# Patient Record
Sex: Male | Born: 1950 | Race: White | Hispanic: No | Smoking: Current every day smoker
Health system: Southern US, Community
[De-identification: ages and names within clinical notes are randomized; demographics above are authoritative.]

## PROBLEM LIST (undated history)

## (undated) DIAGNOSIS — K759 Inflammatory liver disease, unspecified: Secondary | ICD-10-CM

## (undated) DIAGNOSIS — M25569 Pain in unspecified knee: Secondary | ICD-10-CM

## (undated) DIAGNOSIS — M5416 Radiculopathy, lumbar region: Secondary | ICD-10-CM

## (undated) DIAGNOSIS — R011 Cardiac murmur, unspecified: Secondary | ICD-10-CM

## (undated) DIAGNOSIS — F32A Depression, unspecified: Secondary | ICD-10-CM

## (undated) DIAGNOSIS — F419 Anxiety disorder, unspecified: Secondary | ICD-10-CM

## (undated) DIAGNOSIS — M5430 Sciatica, unspecified side: Secondary | ICD-10-CM

## (undated) DIAGNOSIS — G8929 Other chronic pain: Secondary | ICD-10-CM

## (undated) DIAGNOSIS — J449 Chronic obstructive pulmonary disease, unspecified: Secondary | ICD-10-CM

## (undated) DIAGNOSIS — I10 Essential (primary) hypertension: Secondary | ICD-10-CM

## (undated) DIAGNOSIS — Z9981 Dependence on supplemental oxygen: Secondary | ICD-10-CM

## (undated) DIAGNOSIS — S069X9A Unspecified intracranial injury with loss of consciousness of unspecified duration, initial encounter: Secondary | ICD-10-CM

## (undated) DIAGNOSIS — M549 Dorsalgia, unspecified: Secondary | ICD-10-CM

## (undated) DIAGNOSIS — F4310 Post-traumatic stress disorder, unspecified: Secondary | ICD-10-CM

## (undated) DIAGNOSIS — R55 Syncope and collapse: Secondary | ICD-10-CM

## (undated) DIAGNOSIS — F329 Major depressive disorder, single episode, unspecified: Secondary | ICD-10-CM

## (undated) DIAGNOSIS — E119 Type 2 diabetes mellitus without complications: Secondary | ICD-10-CM

## (undated) DIAGNOSIS — K219 Gastro-esophageal reflux disease without esophagitis: Secondary | ICD-10-CM

## (undated) DIAGNOSIS — I739 Peripheral vascular disease, unspecified: Secondary | ICD-10-CM

## (undated) DIAGNOSIS — J189 Pneumonia, unspecified organism: Secondary | ICD-10-CM

## (undated) DIAGNOSIS — M1990 Unspecified osteoarthritis, unspecified site: Secondary | ICD-10-CM

## (undated) DIAGNOSIS — IMO0002 Reserved for concepts with insufficient information to code with codable children: Secondary | ICD-10-CM

## (undated) DIAGNOSIS — T4145XA Adverse effect of unspecified anesthetic, initial encounter: Secondary | ICD-10-CM

## (undated) DIAGNOSIS — J9620 Acute and chronic respiratory failure, unspecified whether with hypoxia or hypercapnia: Secondary | ICD-10-CM

## (undated) DIAGNOSIS — K029 Dental caries, unspecified: Secondary | ICD-10-CM

## (undated) DIAGNOSIS — G4730 Sleep apnea, unspecified: Secondary | ICD-10-CM

## (undated) DIAGNOSIS — M75100 Unspecified rotator cuff tear or rupture of unspecified shoulder, not specified as traumatic: Secondary | ICD-10-CM

## (undated) HISTORY — PX: ROTATOR CUFF REPAIR: SHX139

## (undated) HISTORY — PX: JOINT REPLACEMENT: SHX530

## (undated) HISTORY — PX: ORTHOPEDIC SURGERY: SHX850

## (undated) HISTORY — PX: FACIAL RECONSTRUCTION SURGERY: SHX631

## (undated) HISTORY — PX: INCISION AND DRAINAGE ABSCESS: SHX5864

---

## 2007-10-07 ENCOUNTER — Emergency Department (HOSPITAL_COMMUNITY): Admission: EM | Admit: 2007-10-07 | Discharge: 2007-10-07 | Payer: Self-pay | Admitting: Emergency Medicine

## 2009-02-13 ENCOUNTER — Emergency Department (HOSPITAL_COMMUNITY): Admission: EM | Admit: 2009-02-13 | Discharge: 2009-02-13 | Payer: Self-pay | Admitting: Emergency Medicine

## 2009-03-09 ENCOUNTER — Emergency Department (HOSPITAL_COMMUNITY): Admission: EM | Admit: 2009-03-09 | Discharge: 2009-03-09 | Payer: Self-pay | Admitting: Emergency Medicine

## 2010-02-19 ENCOUNTER — Encounter: Payer: Self-pay | Admitting: Gastroenterology

## 2010-10-04 ENCOUNTER — Encounter: Payer: Self-pay | Admitting: Emergency Medicine

## 2010-10-04 ENCOUNTER — Emergency Department (HOSPITAL_COMMUNITY)
Admission: EM | Admit: 2010-10-04 | Discharge: 2010-10-05 | Disposition: A | Discharge status: Home / Self Care | Payer: Medicaid Other | Attending: Emergency Medicine | Admitting: Emergency Medicine

## 2010-10-04 DIAGNOSIS — S0100XA Unspecified open wound of scalp, initial encounter: Secondary | ICD-10-CM | POA: Insufficient documentation

## 2010-10-04 DIAGNOSIS — I10 Essential (primary) hypertension: Secondary | ICD-10-CM | POA: Insufficient documentation

## 2010-10-04 DIAGNOSIS — S0003XA Contusion of scalp, initial encounter: Secondary | ICD-10-CM

## 2010-10-04 DIAGNOSIS — IMO0002 Reserved for concepts with insufficient information to code with codable children: Secondary | ICD-10-CM | POA: Insufficient documentation

## 2010-10-04 DIAGNOSIS — T07XXXA Unspecified multiple injuries, initial encounter: Secondary | ICD-10-CM

## 2010-10-04 DIAGNOSIS — F17200 Nicotine dependence, unspecified, uncomplicated: Secondary | ICD-10-CM | POA: Insufficient documentation

## 2010-10-04 DIAGNOSIS — T7411XA Adult physical abuse, confirmed, initial encounter: Secondary | ICD-10-CM | POA: Insufficient documentation

## 2010-10-04 HISTORY — DX: Sciatica, unspecified side: M54.30

## 2010-10-04 HISTORY — DX: Reserved for concepts with insufficient information to code with codable children: IMO0002

## 2010-10-04 HISTORY — DX: Depression, unspecified: F32.A

## 2010-10-04 HISTORY — DX: Anxiety disorder, unspecified: F41.9

## 2010-10-04 HISTORY — DX: Major depressive disorder, single episode, unspecified: F32.9

## 2010-10-04 HISTORY — DX: Post-traumatic stress disorder, unspecified: F43.10

## 2010-10-04 HISTORY — DX: Unspecified rotator cuff tear or rupture of unspecified shoulder, not specified as traumatic: M75.100

## 2010-10-04 HISTORY — DX: Essential (primary) hypertension: I10
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
## 2010-10-05 NOTE — ED Provider Notes (Signed)
Medical screening examination/treatment/procedure(s) were performed by non-physician practitioner and as supervising physician I was immediately available for consultation/collaboration.   Charles B. Bernette Mayers, MD 10/05/10 1610

## 2010-11-05 ENCOUNTER — Emergency Department (HOSPITAL_COMMUNITY): Payer: No Typology Code available for payment source

## 2010-11-05 ENCOUNTER — Emergency Department (HOSPITAL_COMMUNITY)
Admission: EM | Admit: 2010-11-05 | Discharge: 2010-11-05 | Disposition: A | Discharge status: Home / Self Care | Payer: No Typology Code available for payment source | Attending: Emergency Medicine | Admitting: Emergency Medicine

## 2010-11-05 ENCOUNTER — Encounter (HOSPITAL_COMMUNITY): Payer: Self-pay | Admitting: Emergency Medicine

## 2010-11-05 DIAGNOSIS — M542 Cervicalgia: Secondary | ICD-10-CM | POA: Insufficient documentation

## 2010-11-05 DIAGNOSIS — M25519 Pain in unspecified shoulder: Secondary | ICD-10-CM | POA: Insufficient documentation

## 2010-11-05 DIAGNOSIS — F17200 Nicotine dependence, unspecified, uncomplicated: Secondary | ICD-10-CM | POA: Insufficient documentation

## 2010-11-05 DIAGNOSIS — R51 Headache: Secondary | ICD-10-CM | POA: Insufficient documentation

## 2010-11-05 DIAGNOSIS — R42 Dizziness and giddiness: Secondary | ICD-10-CM | POA: Insufficient documentation

## 2010-11-05 NOTE — ED Notes (Signed)
Patient to x ray at this time via wheelchair. NAD noted upon departure.

## 2010-11-05 NOTE — ED Notes (Signed)
Pt waiting on radiology results and discharge from EDP.  Denies any pain or complaints at present time.  States "I'm just hungry and ready to go home."
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
## 2010-11-05 NOTE — ED Notes (Signed)
Dr Estell Harpin at bedside for pt evaluation.

## 2010-11-11 ENCOUNTER — Emergency Department (HOSPITAL_COMMUNITY)
Admission: EM | Admit: 2010-11-11 | Discharge: 2010-11-12 | Disposition: A | Discharge status: Home / Self Care | Payer: No Typology Code available for payment source | Attending: Emergency Medicine | Admitting: Emergency Medicine

## 2010-11-11 ENCOUNTER — Encounter (HOSPITAL_COMMUNITY): Payer: Self-pay | Admitting: *Deleted

## 2010-11-11 DIAGNOSIS — I10 Essential (primary) hypertension: Secondary | ICD-10-CM | POA: Insufficient documentation

## 2010-11-11 DIAGNOSIS — M549 Dorsalgia, unspecified: Secondary | ICD-10-CM | POA: Insufficient documentation

## 2010-11-11 DIAGNOSIS — F17200 Nicotine dependence, unspecified, uncomplicated: Secondary | ICD-10-CM | POA: Insufficient documentation

## 2010-11-11 DIAGNOSIS — F411 Generalized anxiety disorder: Secondary | ICD-10-CM | POA: Insufficient documentation

## 2010-11-11 DIAGNOSIS — F3289 Other specified depressive episodes: Secondary | ICD-10-CM | POA: Insufficient documentation

## 2010-11-11 DIAGNOSIS — M5430 Sciatica, unspecified side: Secondary | ICD-10-CM | POA: Insufficient documentation

## 2010-11-11 DIAGNOSIS — F329 Major depressive disorder, single episode, unspecified: Secondary | ICD-10-CM | POA: Insufficient documentation

## 2010-11-11 DIAGNOSIS — F4310 Post-traumatic stress disorder, unspecified: Secondary | ICD-10-CM | POA: Insufficient documentation

## 2010-11-11 MED ORDER — IBUPROFEN 800 MG PO TABS
800.0000 mg | ORAL_TABLET | Freq: Once | ORAL | Status: AC
  Administered 2010-11-12: 800 mg via ORAL
  Filled 2010-11-11: qty 1

## 2010-11-11 MED ORDER — OXYCODONE-ACETAMINOPHEN 5-325 MG PO TABS
2.0000 | ORAL_TABLET | Freq: Once | ORAL | Status: AC
  Administered 2010-11-12: 2 via ORAL
  Filled 2010-11-11: qty 2
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
## 2011-03-21 ENCOUNTER — Encounter (HOSPITAL_COMMUNITY): Payer: Self-pay | Admitting: *Deleted

## 2011-03-21 ENCOUNTER — Emergency Department (HOSPITAL_COMMUNITY)
Admission: EM | Admit: 2011-03-21 | Discharge: 2011-03-21 | Disposition: A | Discharge status: Home / Self Care | Payer: Medicaid Other | Attending: Emergency Medicine | Admitting: Emergency Medicine

## 2011-03-21 DIAGNOSIS — M545 Low back pain, unspecified: Secondary | ICD-10-CM | POA: Insufficient documentation

## 2011-03-21 DIAGNOSIS — F341 Dysthymic disorder: Secondary | ICD-10-CM | POA: Insufficient documentation

## 2011-03-21 DIAGNOSIS — I10 Essential (primary) hypertension: Secondary | ICD-10-CM | POA: Insufficient documentation

## 2011-03-21 DIAGNOSIS — Z760 Encounter for issue of repeat prescription: Secondary | ICD-10-CM | POA: Insufficient documentation

## 2011-03-21 DIAGNOSIS — G8929 Other chronic pain: Secondary | ICD-10-CM | POA: Insufficient documentation

## 2011-03-21 DIAGNOSIS — F17200 Nicotine dependence, unspecified, uncomplicated: Secondary | ICD-10-CM | POA: Insufficient documentation

## 2011-03-21 MED ORDER — OXYCODONE-ACETAMINOPHEN 10-325 MG PO TABS: 1.0000 | ORAL_TABLET | Freq: Four times a day (QID) | ORAL | Status: DC | PRN

## 2011-03-21 MED ORDER — HYDROMORPHONE HCL PF 2 MG/ML IJ SOLN
2.0000 mg | Freq: Once | INTRAMUSCULAR | Status: AC
  Administered 2011-03-21: 2 mg via INTRAMUSCULAR
  Filled 2011-03-21: qty 1

## 2011-03-21 MED ORDER — ONDANSETRON 4 MG PO TBDP
4.0000 mg | ORAL_TABLET | Freq: Once | ORAL | Status: AC
  Administered 2011-03-21: 4 mg via ORAL
  Filled 2011-03-21: qty 1

## 2011-03-21 NOTE — ED Notes (Signed)
C/o lower back pain that radiates down both legs x 2 days, attempted to see MD at pain clinic today but unable and meds have ran out

## 2011-03-21 NOTE — ED Notes (Signed)
Pharmacy tech in to review medications with patient.  Please review that document- differs from what I heard him say just prior to her arrival.

## 2011-03-21 NOTE — ED Notes (Signed)
Scheduled for visit at pain clinic today.  Arrived and tested and informed MD not there would need to return next week.  Last pain med was two days ago - ran out on 16 and 18 with appt on 20th.  Describes pain as severe.
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
## 2011-03-21 NOTE — Discharge Instructions (Signed)
Follow up with the MD of your choice.
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
## 2011-03-22 NOTE — ED Provider Notes (Signed)
Medical screening examination/treatment/procedure(s) were performed by non-physician practitioner and as supervising physician I was immediately available for consultation/collaboration.   [name], MD 03/22/11 [phone]

## 2011-03-23 ENCOUNTER — Encounter (HOSPITAL_COMMUNITY): Payer: Self-pay | Admitting: *Deleted

## 2011-03-23 ENCOUNTER — Emergency Department (HOSPITAL_COMMUNITY): Payer: No Typology Code available for payment source

## 2011-03-23 ENCOUNTER — Emergency Department (HOSPITAL_COMMUNITY)
Admission: EM | Admit: 2011-03-23 | Discharge: 2011-03-23 | Disposition: A | Discharge status: Home / Self Care | Payer: No Typology Code available for payment source | Attending: Emergency Medicine | Admitting: Emergency Medicine

## 2011-03-23 DIAGNOSIS — R109 Unspecified abdominal pain: Secondary | ICD-10-CM | POA: Insufficient documentation

## 2011-03-23 DIAGNOSIS — M79609 Pain in unspecified limb: Secondary | ICD-10-CM | POA: Insufficient documentation

## 2011-03-23 DIAGNOSIS — M545 Low back pain, unspecified: Secondary | ICD-10-CM | POA: Insufficient documentation

## 2011-03-23 DIAGNOSIS — Z79899 Other long term (current) drug therapy: Secondary | ICD-10-CM | POA: Insufficient documentation

## 2011-03-23 DIAGNOSIS — F17200 Nicotine dependence, unspecified, uncomplicated: Secondary | ICD-10-CM | POA: Insufficient documentation

## 2011-03-23 DIAGNOSIS — Y92410 Unspecified street and highway as the place of occurrence of the external cause: Secondary | ICD-10-CM | POA: Insufficient documentation

## 2011-03-23 DIAGNOSIS — F341 Dysthymic disorder: Secondary | ICD-10-CM | POA: Insufficient documentation

## 2011-03-23 DIAGNOSIS — I10 Essential (primary) hypertension: Secondary | ICD-10-CM | POA: Insufficient documentation

## 2011-03-23 DIAGNOSIS — R5381 Other malaise: Secondary | ICD-10-CM | POA: Insufficient documentation

## 2011-03-23 DIAGNOSIS — R079 Chest pain, unspecified: Secondary | ICD-10-CM | POA: Insufficient documentation

## 2011-03-23 DIAGNOSIS — IMO0001 Reserved for inherently not codable concepts without codable children: Secondary | ICD-10-CM | POA: Insufficient documentation

## 2011-03-23 MED ORDER — OXYCODONE-ACETAMINOPHEN 10-325 MG PO TABS: 1.0000 | ORAL_TABLET | Freq: Four times a day (QID) | ORAL | Status: AC | PRN

## 2011-03-23 MED ORDER — MORPHINE SULFATE CR 30 MG PO TB12: 30.0000 mg | ORAL_TABLET | Freq: Two times a day (BID) | ORAL | Status: AC

## 2011-03-23 NOTE — ED Notes (Signed)
Pt requests that cervical collar be taken off. Pt notified that he will need to leave it on until after his x-rays.

## 2011-03-23 NOTE — ED Notes (Signed)
Family at bedside. Patient was given an ice pack for his foot. Patient wants something to drink.
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
## 2011-03-23 NOTE — ED Notes (Signed)
Pt taken off backboard per protocol. Cervical collar left in place.

## 2011-03-23 NOTE — ED Notes (Signed)
Ice pack to patients right foot. Pt has cervical collar in place. Sitting up on stretcher talking and laughing but c/o pain.

## 2011-04-01 ENCOUNTER — Other Ambulatory Visit: Payer: Self-pay

## 2011-04-01 ENCOUNTER — Emergency Department (HOSPITAL_COMMUNITY)
Admission: EM | Admit: 2011-04-01 | Discharge: 2011-04-01 | Disposition: A | Discharge status: Home / Self Care | Payer: No Typology Code available for payment source | Attending: Emergency Medicine | Admitting: Emergency Medicine

## 2011-04-01 ENCOUNTER — Encounter (HOSPITAL_COMMUNITY): Payer: Self-pay

## 2011-04-01 DIAGNOSIS — S9030XA Contusion of unspecified foot, initial encounter: Secondary | ICD-10-CM | POA: Insufficient documentation

## 2011-04-01 DIAGNOSIS — Z79899 Other long term (current) drug therapy: Secondary | ICD-10-CM | POA: Insufficient documentation

## 2011-04-01 DIAGNOSIS — I10 Essential (primary) hypertension: Secondary | ICD-10-CM | POA: Insufficient documentation

## 2011-04-01 DIAGNOSIS — F341 Dysthymic disorder: Secondary | ICD-10-CM | POA: Insufficient documentation

## 2011-04-01 DIAGNOSIS — S161XXA Strain of muscle, fascia and tendon at neck level, initial encounter: Secondary | ICD-10-CM

## 2011-04-01 DIAGNOSIS — M542 Cervicalgia: Secondary | ICD-10-CM | POA: Insufficient documentation

## 2011-04-01 DIAGNOSIS — M79609 Pain in unspecified limb: Secondary | ICD-10-CM | POA: Insufficient documentation

## 2011-04-01 DIAGNOSIS — M545 Low back pain, unspecified: Secondary | ICD-10-CM | POA: Insufficient documentation

## 2011-04-01 DIAGNOSIS — M546 Pain in thoracic spine: Secondary | ICD-10-CM | POA: Insufficient documentation

## 2011-04-01 DIAGNOSIS — S139XXA Sprain of joints and ligaments of unspecified parts of neck, initial encounter: Secondary | ICD-10-CM | POA: Insufficient documentation

## 2011-04-01 MED ORDER — OXYCODONE-ACETAMINOPHEN 5-325 MG PO TABS: 1.0000 | ORAL_TABLET | Freq: Four times a day (QID) | ORAL | Status: AC | PRN
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
## 2011-04-10 ENCOUNTER — Emergency Department (HOSPITAL_COMMUNITY)
Admission: EM | Admit: 2011-04-10 | Discharge: 2011-04-10 | Disposition: A | Discharge status: Home / Self Care | Payer: Medicaid Other | Attending: Emergency Medicine | Admitting: Emergency Medicine

## 2011-04-10 ENCOUNTER — Encounter (HOSPITAL_COMMUNITY): Payer: Self-pay | Admitting: *Deleted

## 2011-04-10 DIAGNOSIS — I10 Essential (primary) hypertension: Secondary | ICD-10-CM | POA: Insufficient documentation

## 2011-04-10 DIAGNOSIS — F411 Generalized anxiety disorder: Secondary | ICD-10-CM | POA: Insufficient documentation

## 2011-04-10 DIAGNOSIS — F3289 Other specified depressive episodes: Secondary | ICD-10-CM | POA: Insufficient documentation

## 2011-04-10 DIAGNOSIS — M5430 Sciatica, unspecified side: Secondary | ICD-10-CM | POA: Insufficient documentation

## 2011-04-10 DIAGNOSIS — G8929 Other chronic pain: Secondary | ICD-10-CM | POA: Insufficient documentation

## 2011-04-10 DIAGNOSIS — F4310 Post-traumatic stress disorder, unspecified: Secondary | ICD-10-CM | POA: Insufficient documentation

## 2011-04-10 DIAGNOSIS — F17200 Nicotine dependence, unspecified, uncomplicated: Secondary | ICD-10-CM | POA: Insufficient documentation

## 2011-04-10 DIAGNOSIS — F329 Major depressive disorder, single episode, unspecified: Secondary | ICD-10-CM | POA: Insufficient documentation

## 2011-04-10 DIAGNOSIS — Z79899 Other long term (current) drug therapy: Secondary | ICD-10-CM | POA: Insufficient documentation

## 2011-04-10 MED ORDER — OXYCODONE-ACETAMINOPHEN 5-325 MG PO TABS
2.0000 | ORAL_TABLET | Freq: Once | ORAL | Status: AC
  Administered 2011-04-10: 2 via ORAL
  Filled 2011-04-10: qty 2

## 2011-04-10 MED ORDER — NAPROXEN 500 MG PO TABS: 500.0000 mg | ORAL_TABLET | Freq: Two times a day (BID) | ORAL | Status: DC
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
## 2011-04-10 NOTE — ED Notes (Signed)
Hx of chronic pain-reports pain from neck down-c/o pain to neck, knees, lower back; reports hx of sciatica; was being seen by pain clinic in [place], but was dismissed; reports was not given any Rx to hold him over until he could be seen by another clinic; reports rec'd a note from his pain clinic MD to bring to hospital or doctor for Rx meds for pain; was seen here approx 1 week ago-was written an Rx for Percocet-pt is out of pain meds.

## 2011-04-26 ENCOUNTER — Encounter (HOSPITAL_COMMUNITY): Payer: Self-pay | Admitting: *Deleted

## 2011-04-26 ENCOUNTER — Emergency Department (HOSPITAL_COMMUNITY)
Admission: EM | Admit: 2011-04-26 | Discharge: 2011-04-26 | Disposition: A | Discharge status: Home / Self Care | Payer: No Typology Code available for payment source | Attending: Emergency Medicine | Admitting: Emergency Medicine

## 2011-04-26 DIAGNOSIS — F4310 Post-traumatic stress disorder, unspecified: Secondary | ICD-10-CM | POA: Insufficient documentation

## 2011-04-26 DIAGNOSIS — Z79899 Other long term (current) drug therapy: Secondary | ICD-10-CM | POA: Insufficient documentation

## 2011-04-26 DIAGNOSIS — F3289 Other specified depressive episodes: Secondary | ICD-10-CM | POA: Insufficient documentation

## 2011-04-26 DIAGNOSIS — F329 Major depressive disorder, single episode, unspecified: Secondary | ICD-10-CM | POA: Insufficient documentation

## 2011-04-26 DIAGNOSIS — Z888 Allergy status to other drugs, medicaments and biological substances status: Secondary | ICD-10-CM | POA: Insufficient documentation

## 2011-04-26 DIAGNOSIS — I10 Essential (primary) hypertension: Secondary | ICD-10-CM | POA: Insufficient documentation

## 2011-04-26 DIAGNOSIS — G8929 Other chronic pain: Secondary | ICD-10-CM | POA: Insufficient documentation

## 2011-04-26 DIAGNOSIS — M549 Dorsalgia, unspecified: Secondary | ICD-10-CM

## 2011-04-26 DIAGNOSIS — F17200 Nicotine dependence, unspecified, uncomplicated: Secondary | ICD-10-CM | POA: Insufficient documentation

## 2011-04-26 MED ORDER — OXYCODONE-ACETAMINOPHEN 5-325 MG PO TABS: 1.0000 | ORAL_TABLET | ORAL | Status: DC | PRN

## 2011-04-26 MED ORDER — METHOCARBAMOL 500 MG PO TABS: ORAL_TABLET | ORAL | Status: DC

## 2011-04-26 MED ORDER — OXYCODONE-ACETAMINOPHEN 5-325 MG PO TABS
2.0000 | ORAL_TABLET | Freq: Once | ORAL | Status: AC
  Administered 2011-04-26: 2 via ORAL
  Filled 2011-04-26: qty 2
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
## 2011-04-26 NOTE — ED Notes (Signed)
Pt DC to home with steady gait 

## 2011-04-26 NOTE — Discharge Instructions (Signed)
Chronic Back Pain When back pain lasts longer than 3 months, it is called chronic back pain.This pain can be frustrating, but the cause of the pain is rarely dangerous.People with chronic back pain often go through certain periods that are more intense (flare-ups). CAUSES Chronic back pain can be caused by wear and tear (degeneration) on different structures in your back. These structures may include bones, ligaments, or discs. This degeneration may result in more pressure being placed on the nerves that travel to your legs and feet. This can lead to pain traveling from the low back down the back of the legs. When pain lasts longer than 3 months, it is not unusual for people to experience anxiety or depression. Anxiety and depression can also contribute to low back pain. TREATMENT  Establish a regular exercise plan. This is critical to improving your functional level.   Have a self-management plan for when you flare-up. Flare-ups rarely require a medical visit. Regular exercise will help reduce the intensity and frequency of your flare-ups.   Manage how you feel about your back pain and the rest of your life. Anxiety, depression, and feeling that you cannot alter your back pain have been shown to make back pain more intense and debilitating.   Medicines should never be your only treatment. They should be used along with other treatments to help you return to a more active lifestyle.   Procedures such as injections or surgery may be helpful but are rarely necessary. You may be able to get the same results with physical therapy or chiropractic care.  HOME CARE INSTRUCTIONS  Avoid bending, heavy lifting, prolonged sitting, and activities which make the problem worse.   Continue normal activity as much as possible.   Take brief periods of rest throughout the day to reduce your pain during flare-ups.   Follow your back exercise rehabilitation program. This can help reduce symptoms and prevent  more pain.   Only take over-the-counter or prescription medicines as directed by your caregiver. Muscle relaxants are sometimes prescribed. Narcotic pain medicine is discouraged for long-term pain, since addiction is a possible outcome.   If you smoke, quit.   Eat healthy foods and maintain a recommended body weight.  SEEK IMMEDIATE MEDICAL CARE IF:   You have weakness or numbness in one of your legs or feet.   You have trouble controlling your bladder or bowels.   You develop nausea, vomiting, abdominal pain, shortness of breath, or fainting.  Document Released: 02/23/2004 Document Revised: 01/04/2011 Document Reviewed: 12/30/2010 [employer] Patient Information 2012 [employer]. 

## 2011-04-27 NOTE — ED Provider Notes (Signed)
Medical screening examination/treatment/procedure(s) were performed by non-physician practitioner and as supervising physician I was immediately available for consultation/collaboration.  Terriann Difonzo, MD 04/27/11 0040 

## 2011-05-04 ENCOUNTER — Encounter (HOSPITAL_COMMUNITY): Payer: Self-pay

## 2011-05-04 ENCOUNTER — Emergency Department (HOSPITAL_COMMUNITY)
Admission: EM | Admit: 2011-05-04 | Discharge: 2011-05-04 | Disposition: A | Discharge status: Home / Self Care | Payer: Medicaid Other | Attending: Emergency Medicine | Admitting: Emergency Medicine

## 2011-05-04 ENCOUNTER — Emergency Department (HOSPITAL_COMMUNITY): Payer: Medicaid Other

## 2011-05-04 DIAGNOSIS — IMO0002 Reserved for concepts with insufficient information to code with codable children: Secondary | ICD-10-CM | POA: Insufficient documentation

## 2011-05-04 DIAGNOSIS — Z79899 Other long term (current) drug therapy: Secondary | ICD-10-CM | POA: Insufficient documentation

## 2011-05-04 DIAGNOSIS — S43409A Unspecified sprain of unspecified shoulder joint, initial encounter: Secondary | ICD-10-CM

## 2011-05-04 DIAGNOSIS — M25519 Pain in unspecified shoulder: Secondary | ICD-10-CM | POA: Insufficient documentation

## 2011-05-04 DIAGNOSIS — R609 Edema, unspecified: Secondary | ICD-10-CM | POA: Insufficient documentation

## 2011-05-04 DIAGNOSIS — Z9889 Other specified postprocedural states: Secondary | ICD-10-CM | POA: Insufficient documentation

## 2011-05-04 DIAGNOSIS — I10 Essential (primary) hypertension: Secondary | ICD-10-CM | POA: Insufficient documentation

## 2011-05-04 DIAGNOSIS — X500XXA Overexertion from strenuous movement or load, initial encounter: Secondary | ICD-10-CM | POA: Insufficient documentation

## 2011-05-04 MED ORDER — NAPROXEN 500 MG PO TABS: 500.0000 mg | ORAL_TABLET | Freq: Two times a day (BID) | ORAL | Status: DC

## 2011-05-04 MED ORDER — ONDANSETRON 8 MG PO TBDP
8.0000 mg | ORAL_TABLET | Freq: Once | ORAL | Status: AC
  Administered 2011-05-04: 8 mg via ORAL
  Filled 2011-05-04: qty 1

## 2011-05-04 MED ORDER — OXYCODONE-ACETAMINOPHEN 5-325 MG PO TABS: 1.0000 | ORAL_TABLET | ORAL | Status: AC | PRN

## 2011-05-04 MED ORDER — HYDROMORPHONE HCL PF 1 MG/ML IJ SOLN
1.0000 mg | Freq: Once | INTRAMUSCULAR | Status: AC
  Administered 2011-05-04: 1 mg via INTRAMUSCULAR
  Filled 2011-05-04: qty 1

## 2011-05-04 MED ORDER — METHOCARBAMOL 500 MG PO TABS: 1000.0000 mg | ORAL_TABLET | Freq: Three times a day (TID) | ORAL | Status: AC

## 2011-05-04 NOTE — ED Notes (Signed)
Pt presents with left shoulder pain after hearing a "pop" while moving arm quickly to pick up a dish tonight. Pt has had surgery on left shoulder in the pain. Pt states arm is numb and having shooting pain down arm.

## 2011-05-04 NOTE — ED Notes (Signed)
Pt states that he has an appt with PCP on 10th and with pain clinic on the 14th.

## 2011-05-04 NOTE — Discharge Instructions (Signed)
Ligament Sprain A ligament sprain is when the bands of tissue that hold bones together (ligament) are stretched. HOME CARE   Rest the injured area.   Start using the joint when told to by your doctor.   Keep the injured area raised (elevated) above the level of the heart. This may lessen puffiness (swelling).   Put ice on the injured area.   Put ice in a plastic bag.   Place a towel between your skin and the bag.   Leave the ice on for 15 to 20 minutes, 3 to 4 times a day.   Wear a splint, cast, or an elastic bandage as told by your doctor.   Only take medicine as told by your doctor.   Use crutches as told by your doctor. Do not put weight on the injured joint until told to by your doctor.  GET HELP RIGHT AWAY IF:   You have more bruising, puffiness, or pain.   The leg was injured and the toes are cold, tingling, numb, or blue.   The arm was injured and the fingers are cold, tingling, numb, or blue.   The pain is not helped with medicine.   The pain gets worse.  MAKE SURE YOU:   Understand these instructions.   Will watch this condition.   Will get help right away if you are not doing well or get worse.  Document Released: 07/04/2007 Document Revised: 01/04/2011 Document Reviewed: 07/04/2007 [facility] Patient Information 2012 [place], [place].   Use the medicines prescribed in addition to the percocet you already have for your pain relief.  Ice and elevate your arm,  Use the sling for comfort.  Call Dr Chaney Malling for a  Recheck of your injury on Monday.
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
## 2011-05-05 NOTE — ED Provider Notes (Signed)
Medical screening examination/treatment/procedure(s) were performed by non-physician practitioner and as supervising physician I was immediately available for consultation/collaboration.   Darcey Cardy, MD 05/05/11 2338 

## 2011-05-05 NOTE — ED Provider Notes (Signed)
History     CSN: [id number]  Arrival date & time 05/04/11  2107   First MD Initiated Contact with Patient 05/04/11 2142      Chief Complaint  Patient presents with  . Shoulder Pain    (Consider location/radiation/quality/duration/timing/severity/associated sxs/prior treatment) HPI Comments: Patient has a history of chronic left shoulder pain with surgery of the torn rotator cuff who developed sudden onset of pain in the anterior left shoulder radiating into his mid biceps area after trying to catch the plate which fell off of the counter prior to arrival.  Pain is worse with flexing his elbow and range of motion of his left shoulder.  He also has swelling at his left bicep.  Patient is a 61 y.o. male presenting with shoulder pain. The history is provided by the patient.  Shoulder Pain This is a recurrent problem. The current episode started today. The problem occurs constantly. The problem has been rapidly worsening. Associated symptoms include arthralgias, myalgias and weakness. Pertinent negatives include no chest pain, chills, fever, joint swelling, nausea, neck pain, numbness or rash. He has tried nothing for the symptoms.    Past Medical History  Diagnosis Date  . Sciatica   . Bulging discs   . Torn rotator cuff   . PTSD (post-traumatic stress disorder)   . Depression   . Anxiety   . Hypertension   . Apnea     Past Surgical History  Procedure Date  . Orthopedic surgery   . Rotator cuff repair     left    Family History  Problem Relation Age of Onset  . Hypertension Sister   . Diabetes Sister     History  Substance Use Topics  . Smoking status: Current Some Day Smoker -- 0.2 packs/day for 1 years    Types: Cigarettes  . Smokeless tobacco: Never Used  . Alcohol Use: No      Review of Systems  Constitutional: Negative for fever and chills.  HENT: Negative for neck pain.   Respiratory: Negative for chest tightness and shortness of breath.   Cardiovascular:  Negative for chest pain.  Gastrointestinal: Negative for nausea.  Genitourinary: Negative.   Musculoskeletal: Positive for myalgias and arthralgias. Negative for joint swelling.  Skin: Negative.  Negative for rash and wound.  Neurological: Positive for weakness. Negative for numbness.  Hematological: Negative.   Psychiatric/Behavioral: Negative.     Allergies  Flexeril and Other  Home Medications   Current Outpatient Rx  Name Route Sig Dispense Refill  . B-COMPLEX PO Oral Take 1 capsule by mouth daily.    Marland Kitchen CLONAZEPAM 1 MG PO TABS Oral Take 1 mg by mouth 4 (four) times daily.     Marland Kitchen ESCITALOPRAM OXALATE 20 MG PO TABS Oral Take 20 mg by mouth daily.      Marland Kitchen GABAPENTIN 600 MG PO TABS Oral Take 600 mg by mouth 3 (three) times daily.    Marland Kitchen HYDROCHLOROTHIAZIDE 25 MG PO TABS Oral Take 25 mg by mouth daily.      Marland Kitchen METHOCARBAMOL 500 MG PO TABS  Take two tabs po TID prn muscle spasms 42 tablet 0  . NAPROXEN 500 MG PO TABS Oral Take 1 tablet (500 mg total) by mouth 2 (two) times daily with a meal. 30 tablet 2  . OXYCODONE-ACETAMINOPHEN 10-325 MG PO TABS Oral Take 1 tablet by mouth every 4 (four) hours as needed.    . METHOCARBAMOL 500 MG PO TABS Oral Take 2 tablets (1,000 mg total) by  mouth 3 (three) times daily. 20 tablet 0  . NAPROXEN 500 MG PO TABS Oral Take 1 tablet (500 mg total) by mouth 2 (two) times daily. 30 tablet 0  . OXYCODONE-ACETAMINOPHEN 5-325 MG PO TABS Oral Take 1 tablet by mouth every 4 (four) hours as needed for pain. 20 tablet 0    BP 158/99  Pulse 100  Temp(Src) 97.6 F (36.4 C) (Oral)  Resp 18  Ht 5\' 11"  (1.803 m)  Wt 280 lb (127.007 kg)  BMI 39.05 kg/m2  SpO2 97%  Physical Exam  Nursing note and vitals reviewed. Constitutional: He is oriented to person, place, and time. He appears well-developed and well-nourished.  HENT:  Head: Normocephalic.  Eyes: Conjunctivae are normal.  Neck: Normal range of motion.  Cardiovascular: Normal rate and intact distal pulses.   Exam reveals no decreased pulses.   Pulses:      Dorsalis pedis pulses are 2+ on the right side, and 2+ on the left side.       Posterior tibial pulses are 2+ on the right side, and 2+ on the left side.  Pulmonary/Chest: Effort normal.  Musculoskeletal: He exhibits edema and tenderness.       Left shoulder: He exhibits tenderness, bony tenderness, pain and spasm. He exhibits normal range of motion, normal pulse and normal strength.       Left upper arm: He exhibits tenderness, swelling and deformity.       Patient does exhibit decreased strength with attempts to flex elbow against resistance.  He is very tender mid bicep and he does appear to have bulging and deformity of the left bicep in comparison to the right.  He can flex and extend at the shoulder fully but with discomfort.  He has full extension of the left elbow.  Neurological: He is alert and oriented to person, place, and time. No sensory deficit.  Skin: Skin is warm, dry and intact.    ED Course  Procedures (including critical care time)  Labs Reviewed - No data to display Dg Shoulder Left  05/04/2011  *RADIOLOGY REPORT*  Clinical Data: Left shoulder pain, prior surgery  LEFT SHOULDER - 2+ VIEW  Comparison: 11/05/2010  Findings: No fracture or dislocation is seen.  The joint spaces are preserved.  Prior rotator cuff repair.  Visualized left lung is clear.  IMPRESSION: No acute osseous abnormality is seen.  Prior rotator cuff repair.  Original Report Authenticated By: Charline Bills, M.D.     1. Sprain of shoulder and upper arm       MDM  Patient placed in left shoulder sling and  encouraged to followup with his orthopedist early next week for further management of his pain.  X-ray reviewed prior to discharge.  Concerned for possible at least partial bicep tendon rupture.  Differential also includes bicep muscle spasm.  Patient was prescribed Robaxin Naprosyn and oxycodone for pain relief.        Candis Musa,  PA 05/05/11 1709

## 2011-05-18 ENCOUNTER — Encounter (HOSPITAL_COMMUNITY): Payer: Self-pay | Admitting: *Deleted

## 2011-05-18 ENCOUNTER — Emergency Department (HOSPITAL_COMMUNITY)
Admission: EM | Admit: 2011-05-18 | Discharge: 2011-05-19 | Disposition: A | Discharge status: Home / Self Care | Payer: Medicaid Other | Attending: Emergency Medicine | Admitting: Emergency Medicine

## 2011-05-18 DIAGNOSIS — M62838 Other muscle spasm: Secondary | ICD-10-CM | POA: Insufficient documentation

## 2011-05-18 DIAGNOSIS — M79609 Pain in unspecified limb: Secondary | ICD-10-CM | POA: Insufficient documentation

## 2011-05-18 DIAGNOSIS — M7989 Other specified soft tissue disorders: Secondary | ICD-10-CM | POA: Insufficient documentation

## 2011-05-18 DIAGNOSIS — F17200 Nicotine dependence, unspecified, uncomplicated: Secondary | ICD-10-CM | POA: Insufficient documentation

## 2011-05-18 DIAGNOSIS — Z9889 Other specified postprocedural states: Secondary | ICD-10-CM | POA: Insufficient documentation

## 2011-05-18 DIAGNOSIS — F411 Generalized anxiety disorder: Secondary | ICD-10-CM | POA: Insufficient documentation

## 2011-05-18 DIAGNOSIS — G8929 Other chronic pain: Secondary | ICD-10-CM | POA: Insufficient documentation

## 2011-05-18 DIAGNOSIS — IMO0001 Reserved for inherently not codable concepts without codable children: Secondary | ICD-10-CM | POA: Insufficient documentation

## 2011-05-18 DIAGNOSIS — F3289 Other specified depressive episodes: Secondary | ICD-10-CM | POA: Insufficient documentation

## 2011-05-18 DIAGNOSIS — M25519 Pain in unspecified shoulder: Secondary | ICD-10-CM | POA: Insufficient documentation

## 2011-05-18 DIAGNOSIS — F329 Major depressive disorder, single episode, unspecified: Secondary | ICD-10-CM | POA: Insufficient documentation

## 2011-05-18 DIAGNOSIS — I10 Essential (primary) hypertension: Secondary | ICD-10-CM | POA: Insufficient documentation

## 2011-05-18 DIAGNOSIS — R450 Nervousness: Secondary | ICD-10-CM | POA: Insufficient documentation

## 2011-05-18 MED ORDER — ONDANSETRON HCL 4 MG PO TABS
4.0000 mg | ORAL_TABLET | Freq: Once | ORAL | Status: AC
  Administered 2011-05-18: 4 mg via ORAL
  Filled 2011-05-18: qty 1

## 2011-05-18 MED ORDER — OXYCODONE-ACETAMINOPHEN 5-325 MG PO TABS: ORAL_TABLET | ORAL | Status: DC

## 2011-05-18 MED ORDER — KETOROLAC TROMETHAMINE 10 MG PO TABS
10.0000 mg | ORAL_TABLET | Freq: Once | ORAL | Status: AC
  Administered 2011-05-18: 10 mg via ORAL
  Filled 2011-05-18: qty 1

## 2011-05-18 MED ORDER — HYDROMORPHONE HCL PF 1 MG/ML IJ SOLN
1.0000 mg | Freq: Once | INTRAMUSCULAR | Status: AC
  Administered 2011-05-18: 1 mg via INTRAMUSCULAR
  Filled 2011-05-18: qty 1

## 2011-05-18 MED ORDER — MELOXICAM 7.5 MG PO TABS: ORAL_TABLET | ORAL | Status: DC

## 2011-05-18 NOTE — ED Notes (Signed)
Pt c/o continued left bicep pain and lower back pain.
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
## 2011-05-18 NOTE — Discharge Instructions (Signed)
Shoulder Pain  The shoulder is a ball and socket joint. Many muscles and tendons hold the joint together. Many types of injuries and medical problems can cause pain in one or more parts of the shoulder.  HOME CARE   If your doctor feels the problem is not serious, it may help to do the following:  · Put ice on the area.  · Put ice in a plastic bag.  · Place a towel between your skin and the bag.  · Leave the ice on for 15 to 20 minutes, 3 to 4 times a day.  · Do this for the first 2 day or as told by your doctor.  · Stop using cold packs if they do not help with the pain.  · Do not take your sling off (except to shower or bathe) until you see your doctor. When taking off the sling, move the arm as little as possible.  · Take medicine as told by your doctor.  · Keep all follow-up appointments.  GET HELP RIGHT AWAY IF:   · The arm, hand, or fingers are numb or tingling.  · The arm, hand, or fingers are puffy (swollen), painful, or turn white or blue.  · You have trouble moving your hand and fingers on the injured side.  · You have chest pain or shortness of breath.  · New pain happens in the arm, hand, or fingers.  · The hand or fingers on the injured side become cold.  · The medicine is not helping the pain go away.  MAKE SURE YOU:   · Understand these instructions.  · Will watch your condition.  · Will get help right away if you are not doing well or get worse.  Document Released: 07/04/2007 Document Revised: 01/04/2011 Document Reviewed: 07/04/2007  ExitCare® Patient Information ©2012 [employer].

## 2011-05-18 NOTE — ED Notes (Signed)
Pt reporting pain in right arm and shoulder, as well as lower back.  Reports he does have chronic pain issues and has been unable to see orthopedic doctor.

## 2011-05-26 NOTE — ED Provider Notes (Signed)
Evaluation and management procedures were performed by the PA/NP/resident physician under my supervision/collaboration.   Felisa Bonier, MD 05/26/11 [phone]

## 2011-06-06 ENCOUNTER — Encounter (HOSPITAL_COMMUNITY): Payer: Self-pay

## 2011-06-06 ENCOUNTER — Emergency Department (HOSPITAL_COMMUNITY)
Admission: EM | Admit: 2011-06-06 | Discharge: 2011-06-06 | Disposition: A | Discharge status: Home / Self Care | Payer: Medicaid Other | Attending: Emergency Medicine | Admitting: Emergency Medicine

## 2011-06-06 DIAGNOSIS — M25519 Pain in unspecified shoulder: Secondary | ICD-10-CM | POA: Insufficient documentation

## 2011-06-06 DIAGNOSIS — I10 Essential (primary) hypertension: Secondary | ICD-10-CM | POA: Insufficient documentation

## 2011-06-06 DIAGNOSIS — M25512 Pain in left shoulder: Secondary | ICD-10-CM

## 2011-06-06 MED ORDER — OXYCODONE-ACETAMINOPHEN 7.5-325 MG PO TABS: 1.0000 | ORAL_TABLET | ORAL | Status: AC | PRN

## 2011-06-06 MED ORDER — OXYCODONE-ACETAMINOPHEN 5-325 MG PO TABS
2.0000 | ORAL_TABLET | Freq: Once | ORAL | Status: AC
  Administered 2011-06-06: 2 via ORAL
  Filled 2011-06-06 (×2): qty 1

## 2011-06-06 NOTE — ED Notes (Signed)
Pt has chronic pain,  Says he has problems with his neck and back. Pain clinic will no longer treat him.  Pain lt shoulder,feels he has injured muscles in his upper arm.  Hurts all the time but increases with movement.  Pt lt arm in sling.

## 2011-06-06 NOTE — Discharge Instructions (Signed)
Shoulder Pain The shoulder is a ball and socket joint. The muscles and tendons (rotator cuff) are what keep the shoulder in its joint and stable. This collection of muscles and tendons holds in the head (ball) of the humerus (upper arm bone) in the fossa (cup) of the scapula (shoulder blade). Today no reason was found for your shoulder pain. Often pain in the shoulder may be treated conservatively with temporary immobilization. For example, holding the shoulder in one place using a sling for rest. Physical therapy may be needed if problems continue. HOME CARE INSTRUCTIONS   Apply ice to the sore area for 15 to 20 minutes, 3 to 4 times per day for the first 2 days. Put the ice in a plastic bag. Place a towel between the bag of ice and your skin.   If you have or were given a shoulder sling and straps, do not remove for as long as directed by your caregiver or until you see a caregiver for a follow-up examination. If you need to remove it to shower or bathe, move your arm as little as possible.   Sleep on several pillows at night to lessen swelling and pain.   Only take over-the-counter or prescription medicines for pain, discomfort, or fever as directed by your caregiver.   Keep any follow-up appointments in order to avoid any type of permanent shoulder disability or chronic pain problems.  SEEK MEDICAL CARE IF:   Pain in your shoulder increases or new pain develops in your arm, hand, or fingers.   Your hand or fingers are colder than your other hand.   You do not obtain pain relief with the medications or your pain becomes worse.  SEEK IMMEDIATE MEDICAL CARE IF:   Your arm, hand, or fingers are numb or tingling.   Your arm, hand, or fingers are swollen, painful, or turn white or blue.   You develop chest pain or shortness of breath.  MAKE SURE YOU:   Understand these instructions.   Will watch your condition.   Will get help right away if you are not doing well or get worse.    Document Released: 10/25/2004 Document Revised: 01/04/2011 Document Reviewed: 12/30/2010 ExitCare Patient Information 2012 [employer]. 
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
## 2011-06-06 NOTE — ED Provider Notes (Signed)
History     CSN: [id number]  Arrival date & time 06/06/11  1525   First MD Initiated Contact with Patient 06/06/11 1551      Chief Complaint  Patient presents with  . Shoulder Pain    (Consider location/radiation/quality/duration/timing/severity/associated sxs/prior treatment) Patient is a 61 y.o. male presenting with shoulder pain. The history is provided by the patient.  Shoulder Pain This is a chronic problem. The current episode started more than 1 year ago. The problem occurs constantly. The problem has been gradually worsening. Associated symptoms include arthralgias. Pertinent negatives include no chest pain, chills, congestion, coughing, fever, headaches, joint swelling, nausea, neck pain, numbness, rash, visual change, vomiting or weakness. Exacerbated by: movement and palpation. He has tried oral narcotics for the symptoms. The treatment provided no relief.    Past Medical History  Diagnosis Date  . Sciatica   . Bulging discs   . Torn rotator cuff   . PTSD (post-traumatic stress disorder)   . Depression   . Anxiety   . Hypertension   . Apnea   . Pain management     Past Surgical History  Procedure Date  . Orthopedic surgery   . Rotator cuff repair     left    Family History  Problem Relation Age of Onset  . Hypertension Sister   . Diabetes Sister     History  Substance Use Topics  . Smoking status: Current Some Day Smoker -- 0.2 packs/day for 1 years    Types: Cigarettes  . Smokeless tobacco: Never Used  . Alcohol Use: No      Review of Systems  Constitutional: Negative for fever and chills.  HENT: Negative for congestion, neck pain and neck stiffness.   Respiratory: Negative for cough and chest tightness.   Cardiovascular: Negative for chest pain.  Gastrointestinal: Negative for nausea and vomiting.  Musculoskeletal: Positive for arthralgias. Negative for joint swelling.  Skin: Negative for rash.  Neurological: Negative for dizziness,  weakness, numbness and headaches.  All other systems reviewed and are negative.    Allergies  Flexeril and Other  Home Medications     BP 131/84  Pulse 80  Temp(Src) 97.9 F (36.6 C) (Oral)  Resp 20  Ht 5\' 11"  (1.803 m)  Wt 255 lb (115.667 kg)  BMI 35.57 kg/m2  SpO2 97%  Physical Exam  Nursing note and vitals reviewed. Constitutional: He is oriented to person, place, and time. He appears well-developed and well-nourished. No distress.  HENT:  Head: Normocephalic and atraumatic.  Neck: Normal range of motion. Neck supple.  Cardiovascular: Normal rate, regular rhythm, normal heart sounds and intact distal pulses.   No murmur heard. Pulmonary/Chest: Effort normal and breath sounds normal.  Musculoskeletal: He exhibits tenderness. He exhibits no edema.       Left shoulder: He exhibits decreased range of motion, tenderness and pain. He exhibits no swelling, no effusion, no crepitus, no deformity, no laceration, normal pulse and normal strength.       Arms:      Tenderness to palpation of the anterior left shoulder. No obvious deformity, edema or erythema. Pain is also reproduced with abduction and rotation of the left arm and shoulder. Distal sensation is intact, radial pulse is brisk and equal bilaterally, capillary refill is less than 2 seconds.  Lymphadenopathy:    He has no cervical adenopathy.  Neurological: He is alert and oriented to person, place, and time. He has normal strength. No cranial nerve deficit or sensory deficit.  Reflex Scores:      Tricep reflexes are 2+ on the right side and 2+ on the left side.      Bicep reflexes are 2+ on the right side and 2+ on the left side.   ED Course  Procedures (including critical care time)       MDM  Patient has been seen here last month for similar symptoms. He is wearing a sling given to him on his previous visit.  Patient has chronic pain to the left shoulder. Patient has an appointment to see his primary care  physician Dr. Claudie Revering in 2 days. Patient also has a chronic injury to his left bicep with swelling present of the head of the bicep.  No erythema or excessive warmth noted.     Patient has been seen here approximately 2 weeks ago and treated for the same. He states he is been unable to followup with his primary care physician. Denies any new symptoms.  Have advised patient that he will need further management of his pain by either his primary care physician, orthopedist or pain management.  I have reviewed patient's previous ED chart and imaging studies.  Pt given percocet 7.5mg /325mg  # 10    Malea Swilling L. [place], [place] 06/07/11 [phone]

## 2011-06-07 NOTE — ED Provider Notes (Signed)
Medical screening examination/treatment/procedure(s) were performed by non-physician practitioner and as supervising physician I was immediately available for consultation/collaboration.   Ileana Chalupa W. Reshonda Koerber, MD 06/07/11 1645 

## 2011-08-22 ENCOUNTER — Encounter (HOSPITAL_COMMUNITY): Payer: Self-pay | Admitting: *Deleted

## 2011-08-22 ENCOUNTER — Emergency Department (HOSPITAL_COMMUNITY)
Admission: EM | Admit: 2011-08-22 | Discharge: 2011-08-22 | Disposition: A | Discharge status: Home / Self Care | Payer: Medicaid Other | Attending: Emergency Medicine | Admitting: Emergency Medicine

## 2011-08-22 DIAGNOSIS — M545 Low back pain, unspecified: Secondary | ICD-10-CM | POA: Insufficient documentation

## 2011-08-22 DIAGNOSIS — I10 Essential (primary) hypertension: Secondary | ICD-10-CM | POA: Insufficient documentation

## 2011-08-22 DIAGNOSIS — M5430 Sciatica, unspecified side: Secondary | ICD-10-CM

## 2011-08-22 DIAGNOSIS — M79609 Pain in unspecified limb: Secondary | ICD-10-CM | POA: Insufficient documentation

## 2011-08-22 DIAGNOSIS — R0602 Shortness of breath: Secondary | ICD-10-CM | POA: Insufficient documentation

## 2011-08-22 MED ORDER — OXYCODONE-ACETAMINOPHEN 5-325 MG PO TABS: ORAL_TABLET | ORAL | Status: DC

## 2011-08-22 MED ORDER — MELOXICAM 7.5 MG PO TABS: ORAL_TABLET | ORAL | Status: DC

## 2011-08-22 MED ORDER — DIAZEPAM 5 MG PO TABS
5.0000 mg | ORAL_TABLET | Freq: Once | ORAL | Status: AC
  Administered 2011-08-22: 5 mg via ORAL
  Filled 2011-08-22: qty 1

## 2011-08-22 MED ORDER — DIAZEPAM 5 MG PO TABS: ORAL_TABLET | ORAL | Status: DC

## 2011-08-22 MED ORDER — ONDANSETRON 4 MG PO TBDP
4.0000 mg | ORAL_TABLET | Freq: Once | ORAL | Status: AC
  Administered 2011-08-22: 4 mg via ORAL
  Filled 2011-08-22: qty 1

## 2011-08-22 MED ORDER — OXYCODONE-ACETAMINOPHEN 5-325 MG PO TABS
1.0000 | ORAL_TABLET | Freq: Once | ORAL | Status: AC
  Administered 2011-08-22: 1 via ORAL
  Filled 2011-08-22: qty 1

## 2011-08-22 MED ORDER — KETOROLAC TROMETHAMINE 10 MG PO TABS
10.0000 mg | ORAL_TABLET | Freq: Once | ORAL | Status: AC
  Administered 2011-08-22: 10 mg via ORAL
  Filled 2011-08-22: qty 1

## 2011-08-22 NOTE — ED Provider Notes (Signed)
History     CSN: [id number]  Arrival date & time 08/22/11  2244   First MD Initiated Contact with Patient 08/22/11 2256      Chief Complaint  Patient presents with  . Back Pain    (Consider location/radiation/quality/duration/timing/severity/associated sxs/prior treatment) Patient is a 61 y.o. male presenting with back pain. The history is provided by the patient.  Back Pain  This is a chronic problem. The current episode started 3 to 5 hours ago. The problem occurs constantly. The problem has been gradually worsening. Associated with: cutting limbs. The pain is present in the lumbar spine. The quality of the pain is described as shooting and aching. The pain radiates to the left thigh and right thigh. The pain is severe. The symptoms are aggravated by certain positions. The pain is the same all the time. Pertinent negatives include no chest pain, no abdominal pain, no bowel incontinence, no perianal numbness, no bladder incontinence and no dysuria. He has tried analgesics for the symptoms. The treatment provided no relief. Risk factors include obesity.    Past Medical History  Diagnosis Date  . Sciatica   . Bulging discs   . Torn rotator cuff   . PTSD (post-traumatic stress disorder)   . Depression   . Anxiety   . Hypertension   . Apnea   . Pain management     Past Surgical History  Procedure Date  . Orthopedic surgery   . Rotator cuff repair     left  . Joint replacement     Family History  Problem Relation Age of Onset  . Hypertension Sister   . Diabetes Sister     History  Substance Use Topics  . Smoking status: Current Some Day Smoker -- 0.2 packs/day for 1 years    Types: Cigarettes  . Smokeless tobacco: Never Used  . Alcohol Use: No      Review of Systems  Constitutional: Negative for activity change.       All ROS Neg except as noted in HPI  HENT: Negative for nosebleeds and neck pain.   Eyes: Negative for photophobia and discharge.  Respiratory:  Positive for shortness of breath. Negative for cough and wheezing.   Cardiovascular: Negative for chest pain and palpitations.  Gastrointestinal: Negative for abdominal pain, blood in stool and bowel incontinence.  Genitourinary: Negative for bladder incontinence, dysuria, frequency and hematuria.  Musculoskeletal: Positive for back pain. Negative for arthralgias.  Skin: Negative.   Neurological: Negative for dizziness, seizures and speech difficulty.  Psychiatric/Behavioral: Negative for hallucinations and confusion. The patient is nervous/anxious.     Allergies  Flexeril and Other  Home Medications     BP 119/70  Pulse 95  Temp 98.2 F (36.8 C) (Oral)  Resp 20  Ht 5\' 11"  (1.803 m)  Wt 272 lb (123.378 kg)  BMI 37.94 kg/m2  SpO2 95%  Physical Exam  Nursing note and vitals reviewed. Constitutional: He is oriented to person, place, and time. He appears well-developed and well-nourished.  Non-toxic appearance.  HENT:  Head: Normocephalic.  Right Ear: Tympanic membrane and external ear normal.  Left Ear: Tympanic membrane and external ear normal.  Eyes: EOM and lids are normal. Pupils are equal, round, and reactive to light.  Neck: Normal range of motion. Neck supple. Carotid bruit is not present.  Cardiovascular: Normal rate, regular rhythm, normal heart sounds, intact distal pulses and normal pulses.   Pulmonary/Chest: Breath sounds normal. No respiratory distress.  Abdominal: Soft. Bowel sounds are normal.  There is no tenderness. There is no guarding.  Musculoskeletal: Normal range of motion.       There is increased pain and tightness of the lumbar areas on the right and left. There is pain with attempted straight leg raises on the right and the left.  Lymphadenopathy:       Head (right side): No submandibular adenopathy present.       Head (left side): No submandibular adenopathy present.    He has no cervical adenopathy.  Neurological: He is alert and oriented to  person, place, and time. He has normal strength. No cranial nerve deficit or sensory deficit.  Skin: Skin is warm and dry.  Psychiatric: He has a normal mood and affect. His speech is normal.    ED Course  Procedures (including critical care time)  Labs Reviewed - No data to display No results found.  2 No diagnosis found.    MDM  I have reviewed nursing notes, vital signs, and all appropriate lab and imaging results for this patient. Patient has history of sciatica. Patient has required pain management in the past, and is waiting for arrangements for pain management to be restarted in the next week or 2. No gross neuro logic deficit appreciated. Prescription for Percocet one or 2 tablets every 6 hours #20, Mobic 7.5 mg 2 times daily #12 value 3 times daily #21.       Kathie Dike, [place] 08/22/11 2332

## 2011-08-22 NOTE — ED Notes (Signed)
Discharge instructions reviewed with pt, questions answered. Pt verbalized understanding.  

## 2011-08-22 NOTE — ED Notes (Signed)
Low back pain with radiation down both legs 

## 2011-08-23 NOTE — ED Provider Notes (Signed)
Medical screening examination/treatment/procedure(s) were performed by non-physician practitioner and as supervising physician I was immediately available for consultation/collaboration.  Nicoletta Dress. Colon Branch, MD 08/23/11 [phone]

## 2012-07-30 ENCOUNTER — Encounter (HOSPITAL_COMMUNITY): Payer: Self-pay | Admitting: *Deleted

## 2012-07-30 ENCOUNTER — Emergency Department (HOSPITAL_COMMUNITY)
Admission: EM | Admit: 2012-07-30 | Discharge: 2012-07-30 | Disposition: A | Discharge status: Home / Self Care | Payer: Medicaid Other | Attending: Emergency Medicine | Admitting: Emergency Medicine

## 2012-07-30 DIAGNOSIS — M2550 Pain in unspecified joint: Secondary | ICD-10-CM | POA: Insufficient documentation

## 2012-07-30 DIAGNOSIS — F3289 Other specified depressive episodes: Secondary | ICD-10-CM | POA: Insufficient documentation

## 2012-07-30 DIAGNOSIS — F17200 Nicotine dependence, unspecified, uncomplicated: Secondary | ICD-10-CM | POA: Insufficient documentation

## 2012-07-30 DIAGNOSIS — G479 Sleep disorder, unspecified: Secondary | ICD-10-CM | POA: Insufficient documentation

## 2012-07-30 DIAGNOSIS — M436 Torticollis: Secondary | ICD-10-CM | POA: Insufficient documentation

## 2012-07-30 DIAGNOSIS — I10 Essential (primary) hypertension: Secondary | ICD-10-CM | POA: Insufficient documentation

## 2012-07-30 DIAGNOSIS — F329 Major depressive disorder, single episode, unspecified: Secondary | ICD-10-CM | POA: Insufficient documentation

## 2012-07-30 DIAGNOSIS — F411 Generalized anxiety disorder: Secondary | ICD-10-CM | POA: Insufficient documentation

## 2012-07-30 DIAGNOSIS — M549 Dorsalgia, unspecified: Secondary | ICD-10-CM | POA: Insufficient documentation

## 2012-07-30 DIAGNOSIS — Z8659 Personal history of other mental and behavioral disorders: Secondary | ICD-10-CM | POA: Insufficient documentation

## 2012-07-30 MED ORDER — OXYCODONE-ACETAMINOPHEN 5-325 MG PO TABS: 1.0000 | ORAL_TABLET | Freq: Four times a day (QID) | ORAL | Status: DC | PRN

## 2012-07-30 MED ORDER — PREDNISONE 10 MG PO TABS: ORAL_TABLET | ORAL | Status: DC

## 2012-07-30 MED ORDER — FENTANYL CITRATE 0.05 MG/ML IJ SOLN
100.0000 ug | Freq: Once | INTRAMUSCULAR | Status: AC
  Administered 2012-07-30: 100 ug via INTRAMUSCULAR
  Filled 2012-07-30: qty 2

## 2012-07-30 MED ORDER — DIAZEPAM 5 MG/ML IJ SOLN
10.0000 mg | Freq: Once | INTRAMUSCULAR | Status: AC
  Administered 2012-07-30: 10 mg via INTRAMUSCULAR
  Filled 2012-07-30: qty 2

## 2012-07-30 MED ORDER — PREDNISONE 10 MG PO TABS
60.0000 mg | ORAL_TABLET | Freq: Once | ORAL | Status: AC
  Administered 2012-07-30: 60 mg via ORAL
  Filled 2012-07-30: qty 1

## 2012-07-30 MED ORDER — DIAZEPAM 5 MG PO TABS: ORAL_TABLET | ORAL | Status: DC

## 2012-07-30 MED ORDER — ONDANSETRON HCL 4 MG PO TABS
4.0000 mg | ORAL_TABLET | Freq: Once | ORAL | Status: AC
  Administered 2012-07-30: 4 mg via ORAL
  Filled 2012-07-30: qty 1

## 2012-07-30 NOTE — ED Notes (Signed)
Neck pain for 3 days, "started as a crick". No known injury .  Increased pain with movement

## 2012-07-30 NOTE — ED Provider Notes (Signed)
History    CSN: [id number] Arrival date & time 07/30/12  1524  First MD Initiated Contact with Patient 07/30/12 1656     Chief Complaint  Patient presents with  . Neck Pain   (Consider location/radiation/quality/duration/timing/severity/associated sxs/prior Treatment) HPI Comments: Patient is a 62 year old male who states that he has a history of" bulging disc" involving his neck. The patient states that he awakened approximately 3 days ago with soreness and pain in his mid to lower neck. This problem is gotten progressively worse to where now he feels that he has a" crick" in the neck and is painful with certain movements. He feels more comfortable just holding it in one position. The patient denies any numbness of the shoulders or arms. He's not dropping objects. He's not had any new injury or trauma. There's been no operations on the neck recently. The been no recent illnesses or high fevers appreciated.  The history is provided by the patient.   Past Medical History  Diagnosis Date  . Sciatica   . Bulging discs   . Torn rotator cuff   . PTSD (post-traumatic stress disorder)   . Depression   . Anxiety   . Hypertension   . Apnea   . Pain management    Past Surgical History  Procedure Laterality Date  . Orthopedic surgery    . Rotator cuff repair      left  . Joint replacement     Family History  Problem Relation Age of Onset  . Hypertension Sister   . Diabetes Sister    History  Substance Use Topics  . Smoking status: Current Some Day Smoker -- 0.20 packs/day for 1 years    Types: Cigarettes  . Smokeless tobacco: Never Used  . Alcohol Use: No    Review of Systems  Constitutional: Negative for activity change.       All ROS Neg except as noted in HPI  HENT: Negative for nosebleeds and neck pain.   Eyes: Negative for photophobia and discharge.  Respiratory: Negative for cough, shortness of breath and wheezing.   Cardiovascular: Negative for chest pain and  palpitations.  Gastrointestinal: Negative for abdominal pain and blood in stool.  Genitourinary: Negative for dysuria, frequency and hematuria.  Musculoskeletal: Positive for back pain and arthralgias.  Skin: Negative.   Neurological: Negative for dizziness, seizures and speech difficulty.  Psychiatric/Behavioral: Positive for sleep disturbance. Negative for hallucinations and confusion. The patient is nervous/anxious.        Depression    Allergies  Flexeril and Other  Home Medications    BP 175/106  Pulse 90  Temp(Src) 97.8 F (36.6 C) (Oral)  Resp 18  Ht 5\' 10"  (1.778 m)  Wt 260 lb (117.935 kg)  BMI 37.31 kg/m2  SpO2 98% Physical Exam  Nursing note and vitals reviewed. Constitutional: He is oriented to person, place, and time. He appears well-developed and well-nourished.  Non-toxic appearance.  HENT:  Head: Normocephalic.  Right Ear: Tympanic membrane and external ear normal.  Left Ear: Tympanic membrane and external ear normal.  Eyes: EOM and lids are normal. Pupils are equal, round, and reactive to light.  Neck: Normal range of motion. Neck supple. Carotid bruit is not present.  There is pain and spasm to palpation of the mid neck extending into the trapezius. Left more than right. There no hot areas appreciated. There's no palpable step off appreciated. There is pain and soreness with attempted range of motion, but no nuchal rigidity. Is  Cardiovascular: Normal rate, regular rhythm, normal heart sounds, intact distal pulses and normal pulses.   Pulmonary/Chest: Breath sounds normal. No respiratory distress.  Abdominal: Soft. Bowel sounds are normal. There is no tenderness. There is no guarding.  Musculoskeletal: Normal range of motion.  Lymphadenopathy:       Head (right side): No submandibular adenopathy present.       Head (left side): No submandibular adenopathy present.    He has no cervical adenopathy.  Neurological: He is alert and oriented to person, place,  and time. He has normal strength. No cranial nerve deficit or sensory deficit.  Skin: Skin is warm and dry.  Psychiatric: He has a normal mood and affect. His speech is normal.    ED Course  Procedures (including critical care time) Labs Reviewed - No data to display No results found. 1. Torticollis     MDM  I have reviewed nursing notes, vital signs, and all appropriate lab and imaging results for this patient. Patient presents to the emergency department with 3 days of symptoms consistent with torticollis. He has tried heat and conservative measures without success. He has not had any gross neurologic deficits or changes appreciated. The vital signs are essentially within normal limits with the exception of the blood pressure being 175/106.  Patient treated in the emergency department with fentanyl and Valium. Prescription given for prednisone taper, Percocet one every 6 hours, and Valium 3 times daily. Patient to follow with his primary physician next week. Pt ambulatory at d/c without problem.  Kathie Dike, PA-C 07/31/12 [phone]

## 2012-08-01 NOTE — ED Provider Notes (Signed)
Medical screening examination/treatment/procedure(s) were performed by non-physician practitioner and as supervising physician I was immediately available for consultation/collaboration.    Vida Roller, MD 08/01/12 [phone]

## 2012-08-25 ENCOUNTER — Other Ambulatory Visit: Payer: Self-pay | Admitting: Internal Medicine

## 2012-08-25 DIAGNOSIS — M4712 Other spondylosis with myelopathy, cervical region: Secondary | ICD-10-CM

## 2012-09-01 ENCOUNTER — Other Ambulatory Visit: Payer: Medicaid Other

## 2012-09-23 ENCOUNTER — Encounter (HOSPITAL_COMMUNITY): Payer: Self-pay | Admitting: *Deleted

## 2012-09-23 ENCOUNTER — Emergency Department (HOSPITAL_COMMUNITY)
Admission: EM | Admit: 2012-09-23 | Discharge: 2012-09-23 | Disposition: A | Discharge status: Home / Self Care | Payer: Medicaid Other | Attending: Emergency Medicine | Admitting: Emergency Medicine

## 2012-09-23 DIAGNOSIS — Z87828 Personal history of other (healed) physical injury and trauma: Secondary | ICD-10-CM | POA: Insufficient documentation

## 2012-09-23 DIAGNOSIS — I10 Essential (primary) hypertension: Secondary | ICD-10-CM | POA: Insufficient documentation

## 2012-09-23 DIAGNOSIS — M109 Gout, unspecified: Secondary | ICD-10-CM | POA: Insufficient documentation

## 2012-09-23 DIAGNOSIS — Z8739 Personal history of other diseases of the musculoskeletal system and connective tissue: Secondary | ICD-10-CM | POA: Insufficient documentation

## 2012-09-23 DIAGNOSIS — Z79899 Other long term (current) drug therapy: Secondary | ICD-10-CM | POA: Insufficient documentation

## 2012-09-23 DIAGNOSIS — Z8659 Personal history of other mental and behavioral disorders: Secondary | ICD-10-CM | POA: Insufficient documentation

## 2012-09-23 DIAGNOSIS — F329 Major depressive disorder, single episode, unspecified: Secondary | ICD-10-CM | POA: Insufficient documentation

## 2012-09-23 DIAGNOSIS — Z8669 Personal history of other diseases of the nervous system and sense organs: Secondary | ICD-10-CM | POA: Insufficient documentation

## 2012-09-23 DIAGNOSIS — F411 Generalized anxiety disorder: Secondary | ICD-10-CM | POA: Insufficient documentation

## 2012-09-23 DIAGNOSIS — F3289 Other specified depressive episodes: Secondary | ICD-10-CM | POA: Insufficient documentation

## 2012-09-23 DIAGNOSIS — F17200 Nicotine dependence, unspecified, uncomplicated: Secondary | ICD-10-CM | POA: Insufficient documentation

## 2012-09-23 LAB — URIC ACID: Uric Acid, Serum: 8.6 mg/dL — ABNORMAL HIGH (ref 4.0–7.8)

## 2012-09-23 MED ORDER — COLCHICINE 0.6 MG PO TABS
0.6000 mg | ORAL_TABLET | Freq: Once | ORAL | Status: AC
  Administered 2012-09-23: 0.6 mg via ORAL
  Filled 2012-09-23: qty 1

## 2012-09-23 MED ORDER — INDOMETHACIN 50 MG PO CAPS: ORAL_CAPSULE | ORAL | Status: DC

## 2012-09-23 MED ORDER — COLCHICINE 0.6 MG PO TABS: 0.6000 mg | ORAL_TABLET | Freq: Two times a day (BID) | ORAL | Status: DC

## 2012-09-23 MED ORDER — HYDROCODONE-ACETAMINOPHEN 5-325 MG PO TABS: ORAL_TABLET | ORAL | Status: DC

## 2012-09-23 MED ORDER — KETOROLAC TROMETHAMINE 60 MG/2ML IM SOLN
60.0000 mg | Freq: Once | INTRAMUSCULAR | Status: AC
  Administered 2012-09-23: 60 mg via INTRAMUSCULAR
  Filled 2012-09-23: qty 2

## 2012-09-23 NOTE — ED Notes (Signed)
Pt c/o pain in left great toe since last night.  Pt says pain much worse this am.  Reports has been working in yard but no injury to foot .  Denies history of gout.  Joint slightly red at base of toe.  Pedal pulse present.  Capillary refill wnl.

## 2012-09-23 NOTE — ED Notes (Signed)
Pt c/o left great toe pain that started yesterday, denies any injury,. Pain is worse with movement of toe and foot area

## 2012-09-23 NOTE — ED Provider Notes (Signed)
CSN: [id number]     Arrival date & time 09/23/12  0901 History  This chart was scribed for Ward Givens, MD,  by Ashley Jacobs, ED Scribe. The patient was seen in room APA14/AP[facility]14 and the patient's care was started at 9:38 AM.   First MD Initiated Contact with Patient 09/23/12 [phone]     Chief Complaint  Patient presents with  . Foot Pain   (Consider location/radiation/quality/duration/timing/severity/associated sxs/prior Treatment) Patient is a 62 y.o. male presenting with lower extremity pain. The history is provided by the patient and medical records. No language interpreter was used.  Foot Pain  HPI HPI Comments: Max [name] is a 62 y.o. male who presents to the Emergency Department complaining of moderate foot pain of the left great toe that presented yesterday with no injury. He states it started out like a cramp then he noticed he was limping b/o pain. He states he can't even stand to have a sheet touch his foot.   He describes the pain as feeling like needles are sticking in him that is worsened with movement, walking and to touch. Pt denies fevers.He states he has been on HCTZ for 4-5 years. He does drink alcohol occasionally but not recently.   Pt state before arriving to the ED that he tried two dosages of 800 MG of Ibuprofen with no relief. He has a hx of hypertension. Pt states his brother has hx of gout.    Pt's PCP is Dr. Fleet Contras.   . Past Medical History  Diagnosis Date  . Sciatica   . Bulging discs   . Torn rotator cuff   . PTSD (post-traumatic stress disorder)   . Depression   . Anxiety   . Hypertension   . Apnea   . Pain management    Past Surgical History  Procedure Laterality Date  . Orthopedic surgery    . Rotator cuff repair      left  . Joint replacement     Family History  Problem Relation Age of Onset  . Hypertension Sister   . Diabetes Sister    History  Substance Use Topics  . Smoking status: Current Some Day Smoker -- 0.20 packs/day  for 1 years    Types: Cigarettes  . Smokeless tobacco: Never Used  . Alcohol Use: No  retired  Review of Systems  Constitutional: Negative for fever.  Musculoskeletal: Positive for arthralgias.    Allergies  Flexeril and Other  Home Medications   Current Outpatient Rx  Name  Route  Sig  Dispense  Refill  . diazepam (VALIUM) 5 MG tablet      1 po tid for spasm   21 tablet   0   . diazepam (VALIUM) 5 MG tablet      1 po tid for spasm   15 tablet   0   . escitalopram (LEXAPRO) 20 MG tablet   Oral   Take 20 mg by mouth daily.           . fish oil-omega-3 fatty acids 1000 MG capsule   Oral   Take 2 g by mouth daily.         Marland Kitchen gabapentin (NEURONTIN) 600 MG tablet   Oral   Take 600 mg by mouth 3 (three) times daily.         . hydrochlorothiazide (HYDRODIURIL) 25 MG tablet   Oral   Take 25 mg by mouth daily.           Marland Kitchen  meloxicam (MOBIC) 7.5 MG tablet      1 po bid with food   12 tablet   0   . meloxicam (MOBIC) 7.5 MG tablet      1 po bid with food   12 tablet   0   . oxyCODONE-acetaminophen (PERCOCET) 5-325 MG per tablet      1 or 2 po q6h prn pain   20 tablet   0   . oxyCODONE-acetaminophen (PERCOCET/ROXICET) 5-325 MG per tablet      1 or 2 po q6h prn pain   20 tablet   0   . oxyCODONE-acetaminophen (PERCOCET/ROXICET) 5-325 MG per tablet   Oral   Take 1 tablet by mouth every 6 (six) hours as needed for pain.   20 tablet   0   . predniSONE (DELTASONE) 10 MG tablet      6,5,4,3,2,1 - take with food   21 tablet   0   . UNABLE TO FIND   Oral   Take 1 tablet by mouth daily as needed. Med Name: Muscle Relaxer ("Summers")          BP 133/84  Pulse 79  Temp(Src) 98.2 F (36.8 C) (Oral)  Resp 20  Ht 5\' 11"  (1.803 m)  Wt 264 lb (119.75 kg)  BMI 36.84 kg/m2  SpO2 96%  Vital signs normal   Physical Exam  Nursing note and vitals reviewed. Constitutional: He is oriented to person, place, and time. He appears well-developed  and well-nourished.  Non-toxic appearance. He does not appear ill. No distress.  Laughing and joking  HENT:  Head: Normocephalic and atraumatic.  Right Ear: External ear normal.  Left Ear: External ear normal.  Nose: Nose normal. No mucosal edema or rhinorrhea.  Mouth/Throat: Mucous membranes are normal. No dental abscesses or edematous.  Eyes: Conjunctivae and EOM are normal. Pupils are equal, round, and reactive to light.  Neck: Normal range of motion and full passive range of motion without pain. Neck supple.  Pulmonary/Chest: Effort normal. No respiratory distress. He has no rhonchi. He exhibits no crepitus.  Abdominal: Normal appearance.  Musculoskeletal: Normal range of motion. He exhibits no edema and no tenderness.  Non tender in left knee, left lower leg, left ankle Erythema of  MTP joint of left greater toe with tenderness and swelling  feels like may have small joint effusion.      Neurological: He is alert and oriented to person, place, and time. He has normal strength. No cranial nerve deficit.  Skin: Skin is warm, dry and intact. No rash noted. No erythema. No pallor.  Psychiatric: He has a normal mood and affect. His speech is normal and behavior is normal. His mood appears not anxious.    ED Course  Procedures (including critical care time) Medications  ketorolac (TORADOL) injection 60 mg (60 mg Intramuscular Given 09/23/12 0947)  colchicine tablet 0.6 mg (0.6 mg Oral Given 09/23/12 0947)    DIAGNOSTIC STUDIES: Oxygen Saturation is 96% on room air, adequate by my interpretation.    COORDINATION OF CARE: 9:41 AM Discussed course of care with pt which includes Toradol injection and 0.6 mg Colchincine. Pt understands and agrees.   10:29 AM Pt reports that he is still in pain, but it is better.    Results for orders placed during the hospital encounter of 09/23/12  URIC ACID      Result Value Range   Uric Acid, Serum 8.6 (*) 4.0 - 7.8 mg/dL   Laboratory  interpretation  elevated uric acid c/w gout    MDM  we discussed that his hydrochlorothiazide and alcohol could make him have acute gouty flareups. He will be given a diet sheet for the low purine diet and he will need to followup with his PCP to discuss if he should be on allopurinol.    1. Acute gouty arthritis   2. Podagra     Discharge Medication List as of 09/23/2012 11:00 AM    START taking these medications   Details  colchicine 0.6 MG tablet Take 1 tablet (0.6 mg total) by mouth 2 (two) times daily., Starting 09/23/2012, Until Discontinued, Print    HYDROcodone-acetaminophen (NORCO/VICODIN) 5-325 MG per tablet Take 1 or 2 po Q 6hrs for pain, Print    indomethacin (INDOCIN) 50 MG capsule Take 1 po QID x 2 d, then 1 po TID x 2d, then 1 po BID x 2 days then 1 po QD x 4d, Print        Plan discharge    I personally performed the services described in this documentation, which was scribed in my presence. The recorded information has been reviewed and considered.  Devoria Albe, MD, [facility]       Ward Givens, MD 09/23/12 [phone]

## 2012-10-04 ENCOUNTER — Emergency Department (HOSPITAL_COMMUNITY)
Admission: EM | Admit: 2012-10-04 | Discharge: 2012-10-04 | Disposition: A | Discharge status: Home / Self Care | Payer: Medicaid Other | Attending: Emergency Medicine | Admitting: Emergency Medicine

## 2012-10-04 ENCOUNTER — Emergency Department (HOSPITAL_COMMUNITY): Payer: Medicaid Other

## 2012-10-04 ENCOUNTER — Encounter (HOSPITAL_COMMUNITY): Payer: Self-pay | Admitting: *Deleted

## 2012-10-04 DIAGNOSIS — Z8739 Personal history of other diseases of the musculoskeletal system and connective tissue: Secondary | ICD-10-CM | POA: Insufficient documentation

## 2012-10-04 DIAGNOSIS — Z79899 Other long term (current) drug therapy: Secondary | ICD-10-CM | POA: Insufficient documentation

## 2012-10-04 DIAGNOSIS — S82402A Unspecified fracture of shaft of left fibula, initial encounter for closed fracture: Secondary | ICD-10-CM

## 2012-10-04 DIAGNOSIS — S8990XA Unspecified injury of unspecified lower leg, initial encounter: Secondary | ICD-10-CM | POA: Insufficient documentation

## 2012-10-04 DIAGNOSIS — X500XXA Overexertion from strenuous movement or load, initial encounter: Secondary | ICD-10-CM | POA: Insufficient documentation

## 2012-10-04 DIAGNOSIS — M25462 Effusion, left knee: Secondary | ICD-10-CM

## 2012-10-04 DIAGNOSIS — S93402A Sprain of unspecified ligament of left ankle, initial encounter: Secondary | ICD-10-CM

## 2012-10-04 DIAGNOSIS — F3289 Other specified depressive episodes: Secondary | ICD-10-CM | POA: Insufficient documentation

## 2012-10-04 DIAGNOSIS — S82839A Other fracture of upper and lower end of unspecified fibula, initial encounter for closed fracture: Secondary | ICD-10-CM | POA: Insufficient documentation

## 2012-10-04 DIAGNOSIS — I10 Essential (primary) hypertension: Secondary | ICD-10-CM | POA: Insufficient documentation

## 2012-10-04 DIAGNOSIS — IMO0002 Reserved for concepts with insufficient information to code with codable children: Secondary | ICD-10-CM | POA: Insufficient documentation

## 2012-10-04 DIAGNOSIS — Y9389 Activity, other specified: Secondary | ICD-10-CM | POA: Insufficient documentation

## 2012-10-04 DIAGNOSIS — S93409A Sprain of unspecified ligament of unspecified ankle, initial encounter: Secondary | ICD-10-CM | POA: Insufficient documentation

## 2012-10-04 DIAGNOSIS — F17200 Nicotine dependence, unspecified, uncomplicated: Secondary | ICD-10-CM | POA: Insufficient documentation

## 2012-10-04 DIAGNOSIS — Y929 Unspecified place or not applicable: Secondary | ICD-10-CM | POA: Insufficient documentation

## 2012-10-04 DIAGNOSIS — Z8669 Personal history of other diseases of the nervous system and sense organs: Secondary | ICD-10-CM | POA: Insufficient documentation

## 2012-10-04 DIAGNOSIS — F411 Generalized anxiety disorder: Secondary | ICD-10-CM | POA: Insufficient documentation

## 2012-10-04 DIAGNOSIS — F329 Major depressive disorder, single episode, unspecified: Secondary | ICD-10-CM | POA: Insufficient documentation

## 2012-10-04 MED ORDER — OXYCODONE-ACETAMINOPHEN 10-325 MG PO TABS: 1.0000 | ORAL_TABLET | Freq: Four times a day (QID) | ORAL | Status: DC | PRN

## 2012-10-04 MED ORDER — MELOXICAM 7.5 MG PO TABS: 7.5000 mg | ORAL_TABLET | Freq: Every day | ORAL | Status: DC

## 2012-10-04 MED ORDER — OXYCODONE-ACETAMINOPHEN 5-325 MG PO TABS
2.0000 | ORAL_TABLET | Freq: Once | ORAL | Status: AC
  Administered 2012-10-04: 2 via ORAL
  Filled 2012-10-04: qty 2

## 2012-10-04 MED ORDER — HYDROCODONE-ACETAMINOPHEN 5-325 MG PO TABS: 1.0000 | ORAL_TABLET | ORAL | Status: DC | PRN

## 2012-10-04 NOTE — ED Notes (Signed)
Pt was helping move a washing machine when he ended up holding most of the weight, twisting his left knee, pt c/o pain to bilateral knee areas, left knee pain radiates down left leg and into left ankle. Cms intact distal.

## 2012-10-04 NOTE — ED Notes (Signed)
Pt c/o bilateral knee pain as well as left ankle pain x1 week. Pt states he was helping move a washer when he twisted his left knee and ankle. Mild swelling noted on knees bilaterally. Pt states he's had 4 surgeries on his right knee and reports "new pain" in that knee.

## 2012-10-04 NOTE — ED Provider Notes (Signed)
CSN: [id number]     Arrival date & time 10/04/12  1540 History   First MD Initiated Contact with Patient 10/04/12 1655     Chief Complaint  Patient presents with  . Knee Pain   (Consider location/radiation/quality/duration/timing/severity/associated sxs/prior Treatment) Patient is a 62 y.o. male presenting with knee pain. The history is provided by the patient.  Knee Pain Location:  Knee Time since incident:  1 week Injury: yes   Mechanism of injury comment:  Twisting Knee location:  L knee and R knee Associated symptoms: no fever and no neck pain    BARTLETT ENKE is a 62 y.o. male who presents to the ED with bilateral knee pain after helping someone move a washing machine. He states that he ended up holding most of the weight and twisted his left knee. He now complains of bilateral knee pain that radiates down his left leg. He has a history of chronic knee pain and has had joint replacement. His medical history is positive for chronic back and knee pain, torn rotator cuff, mental health problems.  Past Medical History  Diagnosis Date  . Sciatica   . Bulging discs   . Torn rotator cuff   . PTSD (post-traumatic stress disorder)   . Depression   . Anxiety   . Hypertension   . Apnea   . Pain management    Past Surgical History  Procedure Laterality Date  . Orthopedic surgery    . Rotator cuff repair      left  . Joint replacement     Family History  Problem Relation Age of Onset  . Hypertension Sister   . Diabetes Sister    History  Substance Use Topics  . Smoking status: Current Some Day Smoker -- 0.20 packs/day for 1 years    Types: Cigarettes  . Smokeless tobacco: Never Used  . Alcohol Use: No    Review of Systems  Constitutional: Negative for fever and chills.  HENT: Negative for neck pain.   Respiratory: Negative for shortness of breath.   Cardiovascular: Negative for chest pain.  Gastrointestinal: Negative for nausea and vomiting.  Musculoskeletal: Positive  for gait problem (due to pain).       Left knee pain, left ankle pain  Skin: Wound: abrasions to ankle.  Psychiatric/Behavioral: The patient is not nervous/anxious.     Allergies  Flexeril and Other  Home Medications   Current Outpatient Rx  Name  Route  Sig  Dispense  Refill  . clonazePAM (KLONOPIN) 0.5 MG tablet   Oral   Take 0.5-1 mg by mouth every 4 (four) hours as needed for anxiety.         . colchicine 0.6 MG tablet   Oral   Take 1 tablet (0.6 mg total) by mouth 2 (two) times daily.   28 tablet   0   . escitalopram (LEXAPRO) 20 MG tablet   Oral   Take 20 mg by mouth daily.           Marland Kitchen gabapentin (NEURONTIN) 800 MG tablet   Oral   Take 800 mg by mouth every 4 (four) hours as needed (PTSD).         Marland Kitchen oxyCODONE-acetaminophen (PERCOCET/ROXICET) 5-325 MG per tablet   Oral   Take 1 tablet by mouth daily as needed for pain.          BP 122/98  Pulse 84  Temp(Src) 98.2 F (36.8 C) (Oral)  Resp 20  Ht 5\' 10"  (1.778  m)  Wt 258 lb (117.028 kg)  BMI 37.02 kg/m2  SpO2 99% Physical Exam  Nursing note and vitals reviewed. Constitutional: He is oriented to person, place, and time. He appears well-developed and well-nourished. No distress.  Eyes: EOM are normal.  Neck: Neck supple.  Cardiovascular: Normal rate.   Pulmonary/Chest: Effort normal.  Musculoskeletal:       Left knee: He exhibits decreased range of motion, swelling and bony tenderness. He exhibits no erythema. Tenderness found. Lateral joint line and LCL tenderness noted.       Legs: Pedal pulses equal, adequate circulation, good touch sensation.  Neurological: He is alert and oriented to person, place, and time. He has normal strength. No cranial nerve deficit or sensory deficit.  Skin: Skin is warm and dry.  Psychiatric: He has a normal mood and affect. His behavior is normal.    ED Course  Procedures Dg Ankle Complete Left  10/04/2012   *RADIOLOGY REPORT*  Clinical Data: Knee pain, ankle pain   LEFT ANKLE COMPLETE - 3+ VIEW  Comparison: None.  Findings: Ankle mortise intact.  Talar dome is normal.  No malleolar fracture.  Degenerative spurring at the anterior aspect of the talus.  IMPRESSION: No fracture or dislocation.   Original Report Authenticated By: Genevive Bi, M.D.   Dg Knee Complete 4 Views Left  10/04/2012   *RADIOLOGY REPORT*  Clinical Data: Knee and ankle pain after fall 1 week ago.  LEFT KNEE - COMPLETE 4+ VIEW  Comparison: None.  Findings: A minimally displaced, minimally comminuted fracture of the proximal fibula.  Mild lateral displacement.  Probable small suprapatellar joint effusion; evaluation degraded by patient body habitus.  Vascular calcifications in the popliteal fossa.  IMPRESSION: Proximal fibular fracture.  Suspicion of a small suprapatellar joint effusion.   Original Report Authenticated By: Jeronimo Greaves, M.D.    MDM: Dr. Judd Lien in to examine the patient and discuss plan of care.   62 y.o. male with left knee and ankle pain s/p injury one week ago. Fractured proximal fibula. Will place in knee immobilizer, ASO and patient will use crutches and stay non weight bearing until follow up with Dr. Hilda Lias.  Patient stable for discharge home without any immediate complications. Discussed with the patient ice, elevation, no weight bearing and need for follow up with ortho.  Patient voices understanding.    Medication List    TAKE these medications       meloxicam 7.5 MG tablet  Commonly known as:  MOBIC  Take 1 tablet (7.5 mg total) by mouth daily.      ASK your doctor about these medications       clonazePAM 0.5 MG tablet  Commonly known as:  KLONOPIN  Take 0.5-1 mg by mouth every 4 (four) hours as needed for anxiety.     colchicine 0.6 MG tablet  Take 1 tablet (0.6 mg total) by mouth 2 (two) times daily.     escitalopram 20 MG tablet  Commonly known as:  LEXAPRO  Take 20 mg by mouth daily.     gabapentin 800 MG tablet  Commonly known as:  NEURONTIN    Take 800 mg by mouth every 4 (four) hours as needed (PTSD).     oxyCODONE-acetaminophen 5-325 MG per tablet  Commonly known as:  PERCOCET/ROXICET  Take 1 tablet by mouth daily as needed for pain.  Ask about: Which instructions should I use?     oxyCODONE-acetaminophen 10-325 MG per tablet  Commonly known as:  PERCOCET  Take 1 tablet by mouth every 6 (six) hours as needed for pain.  Ask about: Which instructions should I use?            [address] [place], [place] 10/05/12 [phone]

## 2012-10-05 NOTE — ED Provider Notes (Signed)
Medical screening examination/treatment/procedure(s) were conducted as a shared visit with non-physician practitioner(s) and myself.  I personally evaluated the patient during the encounter.  Patient is a 62 year old male presents with complaints of knee pain. He was apparently moving an appliance out of a truck when the object shifted and he was left holding the majority of the weight. This caused him to twist his knee awkwardly. This was one week ago and it has been hurting since that time.  On exam vitals are stable the patient is afebrile. The left knee is noted to have tenderness to palpation over the upper fibula and lateral aspect of the knee. The distal pulses, motor, and sensory are intact.  X-rays reveal a fracture of the proximal fibula. He will be placed in a knee immobilizer, given crutches, and instructed to not weight-bear until followed up by orthopedics this week.  Geoffery Lyons, MD 10/05/12 [phone]

## 2012-10-17 ENCOUNTER — Emergency Department (HOSPITAL_COMMUNITY): Payer: Medicaid Other

## 2012-10-17 ENCOUNTER — Emergency Department (HOSPITAL_COMMUNITY)
Admission: EM | Admit: 2012-10-17 | Discharge: 2012-10-18 | Disposition: A | Discharge status: Home / Self Care | Payer: Medicaid Other | Attending: Emergency Medicine | Admitting: Emergency Medicine

## 2012-10-17 ENCOUNTER — Encounter (HOSPITAL_COMMUNITY): Payer: Self-pay | Admitting: *Deleted

## 2012-10-17 DIAGNOSIS — R05 Cough: Secondary | ICD-10-CM

## 2012-10-17 DIAGNOSIS — R509 Fever, unspecified: Secondary | ICD-10-CM | POA: Insufficient documentation

## 2012-10-17 DIAGNOSIS — F3289 Other specified depressive episodes: Secondary | ICD-10-CM | POA: Insufficient documentation

## 2012-10-17 DIAGNOSIS — M79609 Pain in unspecified limb: Secondary | ICD-10-CM | POA: Insufficient documentation

## 2012-10-17 DIAGNOSIS — M79605 Pain in left leg: Secondary | ICD-10-CM

## 2012-10-17 DIAGNOSIS — F411 Generalized anxiety disorder: Secondary | ICD-10-CM | POA: Insufficient documentation

## 2012-10-17 DIAGNOSIS — F4310 Post-traumatic stress disorder, unspecified: Secondary | ICD-10-CM | POA: Insufficient documentation

## 2012-10-17 DIAGNOSIS — R197 Diarrhea, unspecified: Secondary | ICD-10-CM | POA: Insufficient documentation

## 2012-10-17 DIAGNOSIS — Z87828 Personal history of other (healed) physical injury and trauma: Secondary | ICD-10-CM | POA: Insufficient documentation

## 2012-10-17 DIAGNOSIS — F17200 Nicotine dependence, unspecified, uncomplicated: Secondary | ICD-10-CM | POA: Insufficient documentation

## 2012-10-17 DIAGNOSIS — F329 Major depressive disorder, single episode, unspecified: Secondary | ICD-10-CM | POA: Insufficient documentation

## 2012-10-17 DIAGNOSIS — Z8739 Personal history of other diseases of the musculoskeletal system and connective tissue: Secondary | ICD-10-CM | POA: Insufficient documentation

## 2012-10-17 DIAGNOSIS — R0602 Shortness of breath: Secondary | ICD-10-CM | POA: Insufficient documentation

## 2012-10-17 DIAGNOSIS — I10 Essential (primary) hypertension: Secondary | ICD-10-CM | POA: Insufficient documentation

## 2012-10-17 DIAGNOSIS — Z79899 Other long term (current) drug therapy: Secondary | ICD-10-CM | POA: Insufficient documentation

## 2012-10-17 DIAGNOSIS — R059 Cough, unspecified: Secondary | ICD-10-CM | POA: Insufficient documentation

## 2012-10-17 MED ORDER — OXYCODONE-ACETAMINOPHEN 5-325 MG PO TABS
2.0000 | ORAL_TABLET | Freq: Once | ORAL | Status: AC
  Administered 2012-10-18: 2 via ORAL
  Filled 2012-10-17: qty 2

## 2012-10-17 MED ORDER — ALBUTEROL SULFATE (5 MG/ML) 0.5% IN NEBU
5.0000 mg | INHALATION_SOLUTION | Freq: Once | RESPIRATORY_TRACT | Status: AC
  Administered 2012-10-18: 5 mg via RESPIRATORY_TRACT
  Filled 2012-10-17: qty 1

## 2012-10-17 MED ORDER — IPRATROPIUM BROMIDE 0.02 % IN SOLN
0.5000 mg | Freq: Once | RESPIRATORY_TRACT | Status: AC
  Administered 2012-10-18: 0.5 mg via RESPIRATORY_TRACT
  Filled 2012-10-17: qty 2.5

## 2012-10-17 NOTE — ED Provider Notes (Signed)
CSN: [id number]     Arrival date & time 10/17/12  2302 History  This chart was scribed for Joya Gaskins, MD by Shari Heritage, ED Scribe. The patient was seen in room APA11/APA11. Patient's care was started at 11:35 PM.    Chief Complaint  Patient presents with  . Shortness of Breath    Patient is a 62 y.o. male presenting with shortness of breath and leg pain.  Shortness of Breath Severity:  Moderate Onset quality:  Gradual Duration:  1 day Timing:  Constant Chronicity:  New Context: smoke exposure   Associated symptoms: cough and fever   Associated symptoms: no abdominal pain, no chest pain and no vomiting   Leg Pain Location:  Leg Leg location:  L leg Pain details:    Radiates to:  Does not radiate   Timing:  Constant Chronicity:  New Associated symptoms: fever     HPI Comments: Max [name] is a 62 y.o. male who presents to the Emergency Department complaining of moderate, gradual onset of constant shortness of breath and orthopnea since yesterday. He reports orthopnea that interferes with his ability to sleep. There is associated cough. He denies chest pain. Patient's other symptoms include mild fever and diarrhea. He further denies abdominal pain or vomiting. He has no history of DVT/PE.   He is also complaining of left leg pain that has worsened for several days. Per medical records, he was seen here on 10/05/12 and diagnosed with a fracture of the proximal fibula. He was given a knee immobilizer and crutches and was instructed not to bear weight on the leg until he followed up with orthopedics. He states that he was not completley compliant with using his crutches. He has not spoken with Dr. Hilda Lias about his worsening pain. He states his last dose of pain medication was yesterday.   Past Medical History  Diagnosis Date  . Sciatica   . Bulging discs   . Torn rotator cuff   . PTSD (post-traumatic stress disorder)   . Depression   . Anxiety   . Hypertension   .  Apnea   . Pain management    Past Surgical History  Procedure Laterality Date  . Orthopedic surgery    . Rotator cuff repair      left  . Joint replacement     Family History  Problem Relation Age of Onset  . Hypertension Sister   . Diabetes Sister    History  Substance Use Topics  . Smoking status: Current Some Day Smoker -- 0.20 packs/day for 1 years    Types: Cigarettes  . Smokeless tobacco: Never Used  . Alcohol Use: No    Review of Systems  Constitutional: Positive for fever.  Respiratory: Positive for cough and shortness of breath.   Cardiovascular: Negative for chest pain.  Gastrointestinal: Positive for diarrhea. Negative for vomiting and abdominal pain.  All other systems reviewed and are negative.    Allergies  Flexeril and Other  Home Medications   Current Outpatient Rx  Name  Route  Sig  Dispense  Refill  . clonazePAM (KLONOPIN) 0.5 MG tablet   Oral   Take 0.5-1 mg by mouth every 4 (four) hours as needed for anxiety.         . colchicine 0.6 MG tablet   Oral   Take 1 tablet (0.6 mg total) by mouth 2 (two) times daily.   28 tablet   0   . escitalopram (LEXAPRO) 20 MG tablet  Oral   Take 20 mg by mouth daily.           Marland Kitchen gabapentin (NEURONTIN) 800 MG tablet   Oral   Take 800 mg by mouth every 4 (four) hours as needed (PTSD).         Marland Kitchen meloxicam (MOBIC) 7.5 MG tablet   Oral   Take 1 tablet (7.5 mg total) by mouth daily.   10 tablet   0   . oxyCODONE-acetaminophen (PERCOCET) 10-325 MG per tablet   Oral   Take 1 tablet by mouth every 6 (six) hours as needed for pain.   20 tablet   0   . oxyCODONE-acetaminophen (PERCOCET/ROXICET) 5-325 MG per tablet   Oral   Take 1 tablet by mouth daily as needed for pain.          Triage Vitals: BP 154/82  Pulse 78  Temp(Src) 98 F (36.7 C) (Oral)  Resp 20  Ht 5' 10.5" (1.791 m)  Wt 265 lb (120.203 kg)  BMI 37.47 kg/m2  SpO2 96%  Physical Exam CONSTITUTIONAL: Well developed/well  nourished HEAD: Normocephalic/atraumatic EYES: EOMI/PERRL ENMT: Mucous membranes moist NECK: supple no meningeal signs SPINE:entire spine nontender CV: S1/S2 noted, no murmurs/rubs/gallops noted LUNGS: scattered wheezes noted bilaterally. no apparent distress ABDOMEN: soft, nontender, no rebound or guarding GU:no cva tenderness NEURO: Pt is awake/alert, moves all extremitiesx4 EXTREMITIES: pulses normal, full ROM, no bruising noted, minimal edema to the left ankle No erythema to either calf.  Calves are symmetric.  Mild tenderness diffusely throughout left calf/left knee. No bruising noted.  Distal pulses equal/intact SKIN: warm, color normal PSYCH: no abnormalities of mood noted  ED Course  Procedures DIAGNOSTIC STUDIES: Oxygen Saturation is 96% on room air, adequate by my interpretation.    COORDINATION OF CARE: 11:44 PM- Patient informed of current plan for treatment and evaluation and agrees with plan at this time.   1:01 AM Pt here for two complaints - continued pain in left leg after recent injury.  No acute issue at this time,  I doubt acute DVT.  He also reports SOB but admits to recent cough, congestion and feeling feverish.  He had scattered wheezes that resolved with nebs. He is not hypoxic or tachycardic.  My suspicion for ACS/PE/CHF is low at this time.  I feel he is safe for d/c home.  We discussed strict return precautions.   I advised f/u with his orthopedist for his pain control  MDM  No diagnosis found. Nursing notes including past medical history and social history reviewed and considered in documentation xrays reviewed and considered Labs/vital reviewed and considered Previous records reviewed and considered - recent ED notes reviewed     Date: 10/17/2012  Rate: 74  Rhythm: normal sinus rhythm  QRS Axis: normal  Intervals: normal  ST/T Wave abnormalities: normal  Conduction Disutrbances:none  Narrative Interpretation:   Old EKG Reviewed: unchanged from  03/2011     I personally performed the services described in this documentation, which was scribed in my presence. The recorded information has been reviewed and is accurate.        Joya Gaskins, MD 10/18/12 [phone]

## 2012-10-17 NOTE — ED Notes (Addendum)
Pain lt leg, and sob.  Fx to lt leg and lt ankle.  Seen here for same.  Has been living in a tent recently    Pt does not have on a KI or ankle support.  Says he has not been using his crutches.   Out of pain meds.

## 2012-10-18 LAB — CBC WITH DIFFERENTIAL/PLATELET
Band Neutrophils: 0 % (ref 0–10)
Basophils Absolute: 0 10*3/uL (ref 0.0–0.1)
Basophils Relative: 0 % (ref 0–1)
Blasts: 0 %
Eosinophils Absolute: 0.2 10*3/uL (ref 0.0–0.7)
Eosinophils Relative: 2 % (ref 0–5)
HCT: 39.9 % (ref 39.0–52.0)
Hemoglobin: 13.6 g/dL (ref 13.0–17.0)
Lymphocytes Relative: 35 % (ref 12–46)
Lymphs Abs: 4.3 10*3/uL — ABNORMAL HIGH (ref 0.7–4.0)
MCH: 32.2 pg (ref 26.0–34.0)
MCHC: 34.1 g/dL (ref 30.0–36.0)
MCV: 94.3 fL (ref 78.0–100.0)
Metamyelocytes Relative: 0 %
Monocytes Absolute: 0.5 10*3/uL (ref 0.1–1.0)
Monocytes Relative: 4 % (ref 3–12)
Myelocytes: 0 %
Neutro Abs: 7.4 10*3/uL (ref 1.7–7.7)
Neutrophils Relative %: 59 % (ref 43–77)
Platelets: 265 10*3/uL (ref 150–400)
Promyelocytes Absolute: 0 %
RBC: 4.23 MIL/uL (ref 4.22–5.81)
RDW: 14.5 % (ref 11.5–15.5)
WBC: 12.4 10*3/uL — ABNORMAL HIGH (ref 4.0–10.5)
nRBC: 0 /100 WBC

## 2012-10-18 LAB — BASIC METABOLIC PANEL
BUN: 16 mg/dL (ref 6–23)
CO2: 27 mEq/L (ref 19–32)
Calcium: 8.8 mg/dL (ref 8.4–10.5)
Chloride: 102 mEq/L (ref 96–112)
Creatinine, Ser: 0.99 mg/dL (ref 0.50–1.35)
GFR calc Af Amer: >90 mL/min (ref >90)
GFR calc non Af Amer: 85 mL/min — ABNORMAL LOW (ref >90)
Glucose, Bld: 84 mg/dL (ref 70–99)
Potassium: 3.4 mEq/L — ABNORMAL LOW (ref 3.5–5.1)
Sodium: 137 mEq/L (ref 135–145)

## 2012-10-18 NOTE — Discharge Instructions (Signed)

## 2012-12-13 ENCOUNTER — Emergency Department (HOSPITAL_COMMUNITY)
Admission: EM | Admit: 2012-12-13 | Discharge: 2012-12-13 | Disposition: A | Discharge status: Home / Self Care | Payer: Medicaid Other | Attending: Emergency Medicine | Admitting: Emergency Medicine

## 2012-12-13 ENCOUNTER — Encounter (HOSPITAL_COMMUNITY): Payer: Self-pay | Admitting: Emergency Medicine

## 2012-12-13 DIAGNOSIS — F329 Major depressive disorder, single episode, unspecified: Secondary | ICD-10-CM | POA: Insufficient documentation

## 2012-12-13 DIAGNOSIS — Z8669 Personal history of other diseases of the nervous system and sense organs: Secondary | ICD-10-CM | POA: Insufficient documentation

## 2012-12-13 DIAGNOSIS — G8929 Other chronic pain: Secondary | ICD-10-CM | POA: Insufficient documentation

## 2012-12-13 DIAGNOSIS — F3289 Other specified depressive episodes: Secondary | ICD-10-CM | POA: Insufficient documentation

## 2012-12-13 DIAGNOSIS — R079 Chest pain, unspecified: Secondary | ICD-10-CM | POA: Insufficient documentation

## 2012-12-13 DIAGNOSIS — Z8739 Personal history of other diseases of the musculoskeletal system and connective tissue: Secondary | ICD-10-CM | POA: Insufficient documentation

## 2012-12-13 DIAGNOSIS — I10 Essential (primary) hypertension: Secondary | ICD-10-CM | POA: Insufficient documentation

## 2012-12-13 DIAGNOSIS — R109 Unspecified abdominal pain: Secondary | ICD-10-CM | POA: Insufficient documentation

## 2012-12-13 DIAGNOSIS — F17200 Nicotine dependence, unspecified, uncomplicated: Secondary | ICD-10-CM | POA: Insufficient documentation

## 2012-12-13 DIAGNOSIS — Z79899 Other long term (current) drug therapy: Secondary | ICD-10-CM | POA: Insufficient documentation

## 2012-12-13 DIAGNOSIS — F411 Generalized anxiety disorder: Secondary | ICD-10-CM | POA: Insufficient documentation

## 2012-12-13 DIAGNOSIS — M545 Low back pain, unspecified: Secondary | ICD-10-CM | POA: Insufficient documentation

## 2012-12-13 MED ORDER — HYDROMORPHONE HCL PF 1 MG/ML IJ SOLN
1.0000 mg | Freq: Once | INTRAMUSCULAR | Status: AC
  Administered 2012-12-13: 1 mg via INTRAMUSCULAR
  Filled 2012-12-13: qty 1

## 2012-12-13 MED ORDER — METHOCARBAMOL 500 MG PO TABS
1000.0000 mg | ORAL_TABLET | Freq: Once | ORAL | Status: AC
  Administered 2012-12-13: 1000 mg via ORAL
  Filled 2012-12-13: qty 2

## 2012-12-13 MED ORDER — METHYLPREDNISOLONE 4 MG PO KIT: PACK | ORAL | Status: DC

## 2012-12-13 MED ORDER — ACETAMINOPHEN 325 MG PO TABS
650.0000 mg | ORAL_TABLET | Freq: Once | ORAL | Status: AC
  Administered 2012-12-13: 650 mg via ORAL
  Filled 2012-12-13: qty 2

## 2012-12-13 NOTE — ED Provider Notes (Signed)
CSN: [id number]     Arrival date & time 12/13/12  0117 History   First MD Initiated Contact with Patient 12/13/12 0229     Chief Complaint  Patient presents with  . Chest Pain  . Back Pain  . Flank Pain   (Consider location/radiation/quality/duration/timing/severity/associated sxs/prior Treatment) HPI History per patient. Left-sided low back pain worse after yard work yesterday. Pain worse with movement, bending or twisting. Patient previously followed by pain clinic, is requesting Valium and Demerol.  No radiation of sharp pain. No associated weakness or numbness. No fevers or chills. No incontinence of bowel or bladder.  Past Medical History  Diagnosis Date  . Sciatica   . Bulging discs   . Torn rotator cuff   . PTSD (post-traumatic stress disorder)   . Depression   . Anxiety   . Hypertension   . Apnea    Past Surgical History  Procedure Laterality Date  . Orthopedic surgery    . Rotator cuff repair      left  . Joint replacement     Family History  Problem Relation Age of Onset  . Hypertension Sister   . Diabetes Sister    History  Substance Use Topics  . Smoking status: Current Some Day Smoker -- 0.20 packs/day for 1 years    Types: Cigarettes  . Smokeless tobacco: Never Used  . Alcohol Use: Yes     Comment: occasional    Review of Systems  Constitutional: Negative for fever and chills.  Eyes: Negative for pain.  Respiratory: Negative for shortness of breath.   Cardiovascular: Negative for chest pain.  Gastrointestinal: Negative for abdominal pain.  Genitourinary: Negative for flank pain.  Musculoskeletal: Positive for back pain. Negative for neck pain.  Skin: Negative for rash.  Neurological: Negative for headaches.  All other systems reviewed and are negative.    Allergies  Flexeril and Other  Home Medications   Current Outpatient Rx  Name  Route  Sig  Dispense  Refill  . clonazePAM (KLONOPIN) 0.5 MG tablet   Oral   Take 0.5-1 mg by mouth  every 4 (four) hours as needed for anxiety.         Marland Kitchen escitalopram (LEXAPRO) 20 MG tablet   Oral   Take 20 mg by mouth daily.           Marland Kitchen gabapentin (NEURONTIN) 800 MG tablet   Oral   Take 800 mg by mouth every 4 (four) hours as needed (PTSD).         Marland Kitchen oxyCODONE-acetaminophen (PERCOCET/ROXICET) 5-325 MG per tablet   Oral   Take 1 tablet by mouth daily as needed for pain.          BP 117/63  Pulse 79  Temp(Src) 98.1 F (36.7 C)  Resp 20  Ht 5\' 11"  (1.803 m)  Wt 250 lb (113.399 kg)  BMI 34.88 kg/m2  SpO2 95% Physical Exam  Constitutional: He is oriented to person, place, and time. He appears well-developed and well-nourished.  HENT:  Head: Normocephalic and atraumatic.  Eyes: EOM are normal. Pupils are equal, round, and reactive to light.  Neck: Neck supple.  Cardiovascular: Normal rate, regular rhythm and intact distal pulses.   Pulmonary/Chest: Effort normal and breath sounds normal. No respiratory distress.  Musculoskeletal: Normal range of motion. He exhibits no edema.  Left lower paralumbar tenderness without midline tenderness or deformity. Dolores any deficits with equal DTRs, sensorium to light touch and dorsi plantar flexion.  Neurological: He is alert  and oriented to person, place, and time.  Skin: Skin is warm and dry.    ED Course  Procedures (including critical care time) Labs Review Labs Reviewed - No data to display Imaging Review No results found.  EKG Interpretation   None     IM Dilaudid, ice, tylenol provided Chronic L sided LBP - no red flags or indication for emergent MRI at this time  Patient able to stand and ambulate without difficulty. Plan outpatient followup with referrals provided. Return precautions verbalized as understood MDM  Diagnosis: Chronic low back pain  Medications provided Condition improved VS and nurses notes reviewed   Sunnie Nielsen, MD 12/14/12 [phone]

## 2012-12-13 NOTE — ED Notes (Signed)
Pt was moving brush yesterday for several hours. Pt now c/o lower back pain radiating around to the front of his abdomen, feels like his chest muscles are tight and it hurts to breath.

## 2013-02-23 ENCOUNTER — Other Ambulatory Visit (HOSPITAL_COMMUNITY): Payer: Self-pay | Admitting: Internal Medicine

## 2013-02-23 ENCOUNTER — Ambulatory Visit (HOSPITAL_COMMUNITY)
Admission: RE | Admit: 2013-02-23 | Discharge: 2013-02-23 | Disposition: A | Discharge status: Home / Self Care | Payer: Medicaid Other | Source: Ambulatory Visit | Attending: Internal Medicine | Admitting: Internal Medicine

## 2013-02-23 DIAGNOSIS — J209 Acute bronchitis, unspecified: Secondary | ICD-10-CM

## 2013-02-23 DIAGNOSIS — R05 Cough: Secondary | ICD-10-CM | POA: Insufficient documentation

## 2013-02-23 DIAGNOSIS — R911 Solitary pulmonary nodule: Secondary | ICD-10-CM | POA: Insufficient documentation

## 2013-02-23 DIAGNOSIS — R059 Cough, unspecified: Secondary | ICD-10-CM | POA: Insufficient documentation

## 2013-02-23 DIAGNOSIS — R0989 Other specified symptoms and signs involving the circulatory and respiratory systems: Secondary | ICD-10-CM | POA: Insufficient documentation

## 2013-05-13 ENCOUNTER — Encounter (HOSPITAL_COMMUNITY): Payer: Self-pay | Admitting: Emergency Medicine

## 2013-05-13 ENCOUNTER — Emergency Department (HOSPITAL_COMMUNITY)
Admission: EM | Admit: 2013-05-13 | Discharge: 2013-05-13 | Disposition: A | Discharge status: Home / Self Care | Payer: Medicaid Other | Attending: Emergency Medicine | Admitting: Emergency Medicine

## 2013-05-13 ENCOUNTER — Emergency Department (HOSPITAL_COMMUNITY): Payer: Medicaid Other

## 2013-05-13 DIAGNOSIS — Z79899 Other long term (current) drug therapy: Secondary | ICD-10-CM | POA: Insufficient documentation

## 2013-05-13 DIAGNOSIS — F3289 Other specified depressive episodes: Secondary | ICD-10-CM | POA: Insufficient documentation

## 2013-05-13 DIAGNOSIS — X500XXA Overexertion from strenuous movement or load, initial encounter: Secondary | ICD-10-CM | POA: Insufficient documentation

## 2013-05-13 DIAGNOSIS — F411 Generalized anxiety disorder: Secondary | ICD-10-CM | POA: Insufficient documentation

## 2013-05-13 DIAGNOSIS — F329 Major depressive disorder, single episode, unspecified: Secondary | ICD-10-CM | POA: Insufficient documentation

## 2013-05-13 DIAGNOSIS — S239XXA Sprain of unspecified parts of thorax, initial encounter: Secondary | ICD-10-CM | POA: Insufficient documentation

## 2013-05-13 DIAGNOSIS — Y9389 Activity, other specified: Secondary | ICD-10-CM | POA: Insufficient documentation

## 2013-05-13 DIAGNOSIS — Y929 Unspecified place or not applicable: Secondary | ICD-10-CM | POA: Insufficient documentation

## 2013-05-13 DIAGNOSIS — Z8739 Personal history of other diseases of the musculoskeletal system and connective tissue: Secondary | ICD-10-CM | POA: Insufficient documentation

## 2013-05-13 DIAGNOSIS — F17200 Nicotine dependence, unspecified, uncomplicated: Secondary | ICD-10-CM | POA: Insufficient documentation

## 2013-05-13 DIAGNOSIS — T148XXA Other injury of unspecified body region, initial encounter: Secondary | ICD-10-CM

## 2013-05-13 DIAGNOSIS — I10 Essential (primary) hypertension: Secondary | ICD-10-CM | POA: Insufficient documentation

## 2013-05-13 MED ORDER — IBUPROFEN 800 MG PO TABS
800.0000 mg | ORAL_TABLET | Freq: Once | ORAL | Status: AC
  Administered 2013-05-13: 800 mg via ORAL
  Filled 2013-05-13: qty 1

## 2013-05-13 MED ORDER — OXYCODONE-ACETAMINOPHEN 5-325 MG PO TABS: 1.0000 | ORAL_TABLET | ORAL | Status: DC | PRN

## 2013-05-13 MED ORDER — HYDROCODONE-ACETAMINOPHEN 7.5-325 MG PO TABS: 1.0000 | ORAL_TABLET | Freq: Four times a day (QID) | ORAL | Status: DC | PRN

## 2013-05-13 MED ORDER — DICLOFENAC SODIUM 75 MG PO TBEC: 75.0000 mg | DELAYED_RELEASE_TABLET | Freq: Two times a day (BID) | ORAL | Status: DC

## 2013-05-13 MED ORDER — BACLOFEN 10 MG PO TABS: 10.0000 mg | ORAL_TABLET | Freq: Three times a day (TID) | ORAL | Status: DC

## 2013-05-13 MED ORDER — OXYCODONE-ACETAMINOPHEN 5-325 MG PO TABS
2.0000 | ORAL_TABLET | Freq: Once | ORAL | Status: AC
  Administered 2013-05-13: 2 via ORAL
  Filled 2013-05-13: qty 2

## 2013-05-13 NOTE — ED Provider Notes (Signed)
CSN: [id number]     Arrival date & time 05/13/13  1340 History   First MD Initiated Contact with Patient 05/13/13 1611     Chief Complaint  Patient presents with  . Back Pain     (Consider location/radiation/quality/duration/timing/severity/associated sxs/prior Treatment) Patient is a 63 y.o. male presenting with back pain. The history is provided by the patient.  Back Pain Location:  Thoracic spine Quality:  Burning and stabbing Radiates to:  Does not radiate Pain severity:  Severe Pain is:  Same all the time Onset quality:  Sudden Duration:  12 hours Timing:  Constant Progression:  Unchanged Chronicity:  New Context: twisting   Context comment:  Patient reports sudden onset of burning sharp pain to the area of the left shoulder blade that began suddenly after twisting to turn over in the bed. Relieved by: Raising the left arm over his head. Exacerbated by: Lying supine and reaching forward with the left arm. Ineffective treatments:  NSAIDs and bed rest Associated symptoms: no abdominal pain, no abdominal swelling, no bladder incontinence, no bowel incontinence, no chest pain, no dysuria, no fever, no headaches, no leg pain, no numbness, no paresthesias, no pelvic pain, no perianal numbness, no tingling and no weakness     Past Medical History  Diagnosis Date  . Sciatica   . Bulging discs   . Torn rotator cuff   . PTSD (post-traumatic stress disorder)   . Depression   . Anxiety   . Hypertension   . Apnea    Past Surgical History  Procedure Laterality Date  . Orthopedic surgery    . Rotator cuff repair      left  . Joint replacement     Family History  Problem Relation Age of Onset  . Hypertension Sister   . Diabetes Sister    History  Substance Use Topics  . Smoking status: Current Some Day Smoker -- 0.20 packs/day for 1 years    Types: Cigarettes  . Smokeless tobacco: Never Used  . Alcohol Use: Yes     Comment: occasional    Review of Systems    Constitutional: Negative for fever.  Eyes: Negative for visual disturbance.  Respiratory: Negative for chest tightness and shortness of breath.   Cardiovascular: Negative for chest pain.  Gastrointestinal: Negative for vomiting, abdominal pain, constipation and bowel incontinence.  Genitourinary: Negative for bladder incontinence, dysuria, hematuria, flank pain, decreased urine volume, difficulty urinating and pelvic pain.       No perineal numbness or incontinence of urine or feces  Musculoskeletal: Positive for back pain. Negative for joint swelling, neck pain and neck stiffness.  Skin: Negative for rash.  Neurological: Negative for dizziness, tingling, syncope, speech difficulty, weakness, numbness, headaches and paresthesias.  All other systems reviewed and are negative.     Allergies  Flexeril and Other  Home Medications   Prior to Admission medications   Medication Sig Start Date End Date Taking? Authorizing Provider  clonazePAM (KLONOPIN) 0.5 MG tablet Take 0.5-1 mg by mouth every 4 (four) hours as needed for anxiety.    Historical Provider, MD  escitalopram (LEXAPRO) 20 MG tablet Take 20 mg by mouth daily.      Historical Provider, MD  gabapentin (NEURONTIN) 800 MG tablet Take 800 mg by mouth every 4 (four) hours as needed (PTSD).    Historical Provider, MD   BP 129/79  Pulse 69  Temp(Src) 97.8 F (36.6 C) (Oral)  Resp 18  Ht 5\' 10"  (1.778 m)  Wt 255  lb (115.667 kg)  BMI 36.59 kg/m2  SpO2 97% Physical Exam  Nursing note and vitals reviewed. Constitutional: He is oriented to person, place, and time. He appears well-developed and well-nourished. No distress.  HENT:  Head: Normocephalic and atraumatic.  Neck: Normal range of motion. Neck supple.  Cardiovascular: Normal rate, regular rhythm, normal heart sounds and intact distal pulses.   No murmur heard. Pulmonary/Chest: Effort normal and breath sounds normal. No respiratory distress.  Abdominal: Soft. He exhibits  no distension. There is no tenderness.  Musculoskeletal: He exhibits tenderness. He exhibits no edema.       Thoracic back: He exhibits tenderness. He exhibits normal range of motion, no bony tenderness, no swelling, no edema and normal pulse.       Lumbar back: He exhibits tenderness and pain. He exhibits normal range of motion, no swelling, no deformity, no laceration and normal pulse.       Back:  Localized ttp of the left scapular border and thoracic paraspinal muscles.  No spinal tenderness.  Radial pulses are brisk and symmetrical.  Distal sensation intact. Pain to mid back is reproduced with frontal abduction of the left arm.  Pt has normal strength against resistance of bilateral upper extremities.     Neurological: He is alert and oriented to person, place, and time. He has normal strength. No sensory deficit. He exhibits normal muscle tone. Coordination and gait normal.  Reflex Scores:      Tricep reflexes are 2+ on the right side and 2+ on the left side.      Bicep reflexes are 2+ on the right side and 2+ on the left side.      Achilles reflexes are 2+ on the right side. Skin: Skin is warm and dry. No rash noted.    ED Course  Procedures (including critical care time) Labs Review Labs Reviewed - No data to display  Imaging Review Dg Thoracic Spine W/swimmers  05/13/2013   CLINICAL DATA:  Upper back pain.  No injury.  EXAM: THORACIC SPINE - 2 VIEW + SWIMMERS  COMPARISON:  Chest x-ray 02/23/2013  FINDINGS: Vertebral body alignment and heights are within normal. There is mild a space narrowing several levels over the lower thoracic spine. There is mild spondylosis throughout the thoracic spine unchanged. There is no compression fracture or subluxation. Pedicles are intact.  IMPRESSION: No acute findings.  Mild spondylosis throughout the thoracic spine with mild disc disease over the lower thoracic spine.   Electronically Signed   By: Elberta Fortisaniel  Boyle M.D.   On: 05/13/2013 17:36    EKG  Interpretation None      MDM   Final diagnoses:  Muscle strain      Patient reviewed on the  narcotics database.  Received tramadol #60 on 04/19/13   vital signs are stable. Patient is smiling and well appearing. Pain to the left upper back is reproduced with forward movement of the left arm and palpation of the muscles along the scapula,  Pain resolves with rest of the arm. Symptoms are likely musculoskeletal given history of sudden onset during a twisting movement. Clinical suspicion for cardiac process or aortic dissection is low. Vitals are stable, no hypotension.  Patient agrees to symptomatic treatment and close followup with his PMD, instructed to return if sx's change or worsen.    Elinora Weigand L. Trisha Mangleriplett, PA-C 05/15/13 1415

## 2013-05-13 NOTE — Discharge Instructions (Signed)
Muscle Strain  A muscle strain (pulled muscle) happens when a muscle is stretched beyond normal length. It happens when a sudden, violent force stretches your muscle too far. Usually, a few of the fibers in your muscle are torn. Muscle strain is common in athletes. Recovery usually takes 1 2 weeks. Complete healing takes 5 6 weeks.   HOME CARE    Follow the PRICE method of treatment to help your injury get better. Do this the first 2 3 days after the injury:   Protect. Protect the muscle to keep it from getting injured again.   Rest. Limit your activity and rest the injured body part.   Ice. Put ice in a plastic bag. Place a towel between your skin and the bag. Then, apply the ice and leave it on from 15 20 minutes each hour. After the third day, switch to moist heat packs.   Compression. Use a splint or elastic bandage on the injured area for comfort. Do not put it on too tightly.   Elevate. Keep the injured body part above the level of your heart.   Only take medicine as told by your doctor.   Warm up before doing exercise to prevent future muscle strains.  GET HELP IF:    You have more pain or puffiness (swelling) in the injured area.   You feel numbness, tingling, or notice a loss of strength in the injured area.  MAKE SURE YOU:    Understand these instructions.   Will watch your condition.   Will get help right away if you are not doing well or get worse.  Document Released: 10/25/2007 Document Revised: 11/05/2012 Document Reviewed: 08/14/2012  [employer] Patient Information 2014 [employer]

## 2013-05-13 NOTE — ED Notes (Signed)
Pt reports waking up around 0430 this morning with pain in mid back.  Reports hurts to turn from side to side.  Denies injury.

## 2013-05-13 NOTE — ED Notes (Signed)
Pt states he has upper back/shoulder pain on lt side. Pt denies injury, states he woke up in pain.

## 2013-05-16 NOTE — ED Provider Notes (Signed)
Medical screening examination/treatment/procedure(s) were performed by non-physician practitioner and as supervising physician I was immediately available for consultation/collaboration.   EKG Interpretation None        Casper Pagliuca, MD 05/16/13 2344 

## 2013-06-06 ENCOUNTER — Emergency Department (HOSPITAL_COMMUNITY)
Admission: EM | Admit: 2013-06-06 | Discharge: 2013-06-06 | Disposition: A | Discharge status: Home / Self Care | Payer: Medicaid Other | Attending: Emergency Medicine | Admitting: Emergency Medicine

## 2013-06-06 ENCOUNTER — Encounter (HOSPITAL_COMMUNITY): Payer: Self-pay | Admitting: Emergency Medicine

## 2013-06-06 DIAGNOSIS — Z87828 Personal history of other (healed) physical injury and trauma: Secondary | ICD-10-CM | POA: Insufficient documentation

## 2013-06-06 DIAGNOSIS — F17200 Nicotine dependence, unspecified, uncomplicated: Secondary | ICD-10-CM | POA: Insufficient documentation

## 2013-06-06 DIAGNOSIS — F4310 Post-traumatic stress disorder, unspecified: Secondary | ICD-10-CM | POA: Insufficient documentation

## 2013-06-06 DIAGNOSIS — M25569 Pain in unspecified knee: Secondary | ICD-10-CM

## 2013-06-06 DIAGNOSIS — I10 Essential (primary) hypertension: Secondary | ICD-10-CM | POA: Insufficient documentation

## 2013-06-06 DIAGNOSIS — F411 Generalized anxiety disorder: Secondary | ICD-10-CM | POA: Insufficient documentation

## 2013-06-06 DIAGNOSIS — F3289 Other specified depressive episodes: Secondary | ICD-10-CM | POA: Insufficient documentation

## 2013-06-06 DIAGNOSIS — F329 Major depressive disorder, single episode, unspecified: Secondary | ICD-10-CM | POA: Insufficient documentation

## 2013-06-06 DIAGNOSIS — Z79899 Other long term (current) drug therapy: Secondary | ICD-10-CM | POA: Insufficient documentation

## 2013-06-06 DIAGNOSIS — Z9660 Presence of unspecified orthopedic joint implant: Secondary | ICD-10-CM | POA: Insufficient documentation

## 2013-06-06 DIAGNOSIS — M79669 Pain in unspecified lower leg: Secondary | ICD-10-CM

## 2013-06-06 MED ORDER — OXYCODONE-ACETAMINOPHEN 5-325 MG PO TABS: 1.0000 | ORAL_TABLET | ORAL | Status: DC | PRN

## 2013-06-06 MED ORDER — OXYCODONE-ACETAMINOPHEN 5-325 MG PO TABS
1.0000 | ORAL_TABLET | Freq: Once | ORAL | Status: AC
  Administered 2013-06-06: 1 via ORAL
  Filled 2013-06-06: qty 1

## 2013-06-06 MED ORDER — ENOXAPARIN SODIUM 100 MG/ML ~~LOC~~ SOLN
100.0000 mg | Freq: Once | SUBCUTANEOUS | Status: AC
  Administered 2013-06-06: 100 mg via SUBCUTANEOUS
  Filled 2013-06-06: qty 1

## 2013-06-06 NOTE — ED Notes (Addendum)
Pt c/o left knee pain. Pt c/o escalating pain. Walks with cane.

## 2013-06-06 NOTE — Discharge Instructions (Signed)
Knee Pain Knee pain can be a result of an injury or other medical conditions. Treatment will depend on the cause of your pain. HOME CARE  Only take medicine as told by your doctor.  Keep a healthy weight. Being overweight can make the knee hurt more.  Stretch before exercising or playing sports.  If there is constant knee pain, change the way you exercise. Ask your doctor for advice.  Make sure shoes fit well. Choose the right shoe for the sport or activity.  Protect your knees. Wear kneepads if needed.  Rest when you are tired. GET HELP RIGHT AWAY IF:   Your knee pain does not stop.  Your knee pain does not get better.  Your knee joint feels hot to the touch.  You have a fever. MAKE SURE YOU:   Understand these instructions.  Will watch this condition.  Will get help right away if you are not doing well or get worse. Document Released: 04/13/2008 Document Revised: 04/09/2011 Document Reviewed: 04/13/2008 [employer] Patient Information 2014 [employer].  

## 2013-06-06 NOTE — ED Notes (Signed)
Pt with left knee pain x 10 days, pt states he has seen [facility] in past but needs a referral for his knee pain

## 2013-06-07 ENCOUNTER — Ambulatory Visit (HOSPITAL_COMMUNITY)
Admit: 2013-06-07 | Discharge: 2013-06-07 | Disposition: A | Discharge status: Home / Self Care | Payer: Medicaid Other | Attending: Emergency Medicine | Admitting: Emergency Medicine

## 2013-06-07 DIAGNOSIS — M25569 Pain in unspecified knee: Secondary | ICD-10-CM | POA: Insufficient documentation

## 2013-06-07 DIAGNOSIS — M7989 Other specified soft tissue disorders: Secondary | ICD-10-CM | POA: Insufficient documentation

## 2013-06-07 DIAGNOSIS — M79609 Pain in unspecified limb: Secondary | ICD-10-CM | POA: Insufficient documentation

## 2013-06-07 NOTE — ED Provider Notes (Signed)
Patient returns for outpatient ultrasound of his leg.  Ultrasound results shared with the patient, and he was provided a copy of them.  No evidence for DVT.  Gerhard Munchobert Cem Kosman, MD 06/07/13 1139

## 2013-06-20 NOTE — ED Provider Notes (Signed)
CSN: [id number]     Arrival date & time 06/06/13  2024 History   First MD Initiated Contact with Patient 06/06/13 2110     Chief Complaint  Patient presents with  . Knee Pain     (Consider location/radiation/quality/duration/timing/severity/associated sxs/prior Treatment) Patient is a 63 y.o. male presenting with knee pain. The history is provided by the patient.  Knee Pain Location:  Knee Time since incident:  10 days Injury: no   Knee location:  L knee Pain details:    Quality:  Aching and throbbing   Radiates to:  L leg   Severity:  Moderate   Onset quality:  Gradual   Timing:  Constant   Progression:  Worsening Chronicity:  Recurrent Dislocation: no   Prior injury to area:  No Relieved by:  Nothing Worsened by:  Activity, bearing weight and flexion Ineffective treatments:  NSAIDs, ice and heat Associated symptoms: decreased ROM   Associated symptoms: no back pain, no fever, no neck pain, no numbness, no stiffness, no swelling and no tingling     Past Medical History  Diagnosis Date  . Sciatica   . Bulging discs   . Torn rotator cuff   . PTSD (post-traumatic stress disorder)   . Depression   . Anxiety   . Hypertension   . Apnea    Past Surgical History  Procedure Laterality Date  . Orthopedic surgery    . Rotator cuff repair      left  . Joint replacement     Family History  Problem Relation Age of Onset  . Hypertension Sister   . Diabetes Sister    History  Substance Use Topics  . Smoking status: Current Some Day Smoker -- 0.20 packs/day for 1 years    Types: Cigarettes  . Smokeless tobacco: Never Used  . Alcohol Use: Yes     Comment: occasional    Review of Systems  Constitutional: Negative for fever and chills.  Genitourinary: Negative for dysuria and difficulty urinating.  Musculoskeletal: Positive for arthralgias and joint swelling. Negative for back pain, neck pain and stiffness.  Skin: Negative for color change and wound.  Neurological:  Negative for dizziness, weakness and numbness.  All other systems reviewed and are negative.     Allergies  Flexeril and Other  Home Medications   Prior to Admission medications   Medication Sig Start Date End Date Taking? Authorizing Provider  clonazePAM (KLONOPIN) 0.5 MG tablet Take 0.5 mg by mouth daily.   Yes Historical Provider, MD  escitalopram (LEXAPRO) 20 MG tablet Take 20 mg by mouth daily.     Yes Historical Provider, MD  gabapentin (NEURONTIN) 300 MG capsule Take 600 mg by mouth 3 (three) times daily.   Yes Historical Provider, MD  ibuprofen (ADVIL,MOTRIN) 200 MG tablet Take 200 mg by mouth every 6 (six) hours as needed for headache or moderate pain.   Yes Historical Provider, MD  QUEtiapine (SEROQUEL) 100 MG tablet Take 100 mg by mouth at bedtime.   Yes Historical Provider, MD  zolpidem (AMBIEN) 10 MG tablet Take 10 mg by mouth at bedtime.   Yes Historical Provider, MD  oxyCODONE-acetaminophen (PERCOCET/ROXICET) 5-325 MG per tablet Take 1 tablet by mouth every 4 (four) hours as needed for severe pain. 06/06/13   Jalani Cullifer L. Natsumi Whitsitt, PA-C  oxyCODONE-acetaminophen (PERCOCET/ROXICET) 5-325 MG per tablet Take 1 tablet by mouth every 4 (four) hours as needed for severe pain. 06/06/13   Yitzchok Carriger L. Jacarie Pate, PA-C   BP 115/69  Pulse  68  Temp(Src) 97.7 F (36.5 C) (Oral)  Resp 20  Ht 5\' 10"  (1.778 m)  Wt 255 lb (115.667 kg)  BMI 36.59 kg/m2  SpO2 98% Physical Exam  Nursing note and vitals reviewed. Constitutional: He is oriented to person, place, and time. He appears well-developed and well-nourished. No distress.  Cardiovascular: Normal rate, regular rhythm, normal heart sounds and intact distal pulses.   Pulmonary/Chest: Effort normal and breath sounds normal. No respiratory distress.  Musculoskeletal: He exhibits tenderness. He exhibits no edema.  Diffuse ttp of the left knee and posterior lower leg.  No erythema, excessive warmth,  effusion, or step-off deformity.  Pain of the  knee is reproduced with full flexion.  DP pulse brisk, distal sensation intact. Calf is soft and NT. Compartments of the leg are soft.    Neurological: He is alert and oriented to person, place, and time. He exhibits normal muscle tone. Coordination normal.  Skin: Skin is warm and dry. No erythema.    ED Course  Procedures (including critical care time) Labs Review Labs Reviewed - No data to display  Imaging Review No results found.   EKG Interpretation None      MDM   Final diagnoses:  Knee pain  Lower leg pain     Patient with hx of recurrent left knee pain and pain to the left lower leg.  No hx of recent trauma or previous DVT.  No concerning sx's for septic joint.  Plan includes SQ Lovenox and outpatient venous US scheduled for 06/07/13.  Patient agrees to plan and also given referral info for Dr. Hilda Lias to arrange f/u.  Patient is well appearing and stable for d/c   Triton Heidrich L. Trisha Mangle, PA-C 06/20/13 [phone]

## 2013-06-20 NOTE — ED Provider Notes (Signed)
Medical screening examination/treatment/procedure(s) were performed by non-physician practitioner and as supervising physician I was immediately available for consultation/collaboration.   EKG Interpretation None        Smiley Birr L Rita Prom, MD 06/20/13 1518 

## 2013-07-16 ENCOUNTER — Other Ambulatory Visit (HOSPITAL_COMMUNITY): Payer: Self-pay | Admitting: Orthopaedic Surgery

## 2013-07-16 DIAGNOSIS — M25562 Pain in left knee: Secondary | ICD-10-CM

## 2013-07-21 ENCOUNTER — Ambulatory Visit (HOSPITAL_COMMUNITY)
Admission: RE | Admit: 2013-07-21 | Discharge: 2013-07-21 | Disposition: A | Discharge status: Home / Self Care | Payer: Medicaid Other | Source: Ambulatory Visit | Attending: Orthopaedic Surgery | Admitting: Orthopaedic Surgery

## 2013-07-21 DIAGNOSIS — R937 Abnormal findings on diagnostic imaging of other parts of musculoskeletal system: Secondary | ICD-10-CM | POA: Diagnosis not present

## 2013-07-21 DIAGNOSIS — M7120 Synovial cyst of popliteal space [Baker], unspecified knee: Secondary | ICD-10-CM | POA: Diagnosis not present

## 2013-07-21 DIAGNOSIS — M25569 Pain in unspecified knee: Secondary | ICD-10-CM | POA: Diagnosis present

## 2013-07-21 DIAGNOSIS — IMO0002 Reserved for concepts with insufficient information to code with codable children: Secondary | ICD-10-CM | POA: Insufficient documentation

## 2013-07-21 DIAGNOSIS — M25562 Pain in left knee: Secondary | ICD-10-CM

## 2013-07-23 ENCOUNTER — Other Ambulatory Visit: Payer: Self-pay | Admitting: Radiology

## 2013-07-24 ENCOUNTER — Encounter (HOSPITAL_COMMUNITY): Admission: RE | Admit: 2013-07-24 | Payer: Medicaid Other | Source: Ambulatory Visit

## 2013-07-27 ENCOUNTER — Encounter (HOSPITAL_COMMUNITY): Payer: Self-pay

## 2013-07-27 ENCOUNTER — Other Ambulatory Visit: Payer: Self-pay

## 2013-07-27 ENCOUNTER — Encounter (HOSPITAL_COMMUNITY)
Admission: RE | Admit: 2013-07-27 | Discharge: 2013-07-27 | Disposition: A | Discharge status: Home / Self Care | Payer: Medicaid Other | Source: Ambulatory Visit | Attending: Orthopaedic Surgery | Admitting: Orthopaedic Surgery

## 2013-07-27 ENCOUNTER — Encounter (HOSPITAL_COMMUNITY): Payer: Self-pay | Admitting: Pharmacy Technician

## 2013-07-27 HISTORY — DX: Sleep apnea, unspecified: G47.30

## 2013-07-27 HISTORY — DX: Syncope and collapse: R55

## 2013-07-27 LAB — CBC WITH DIFFERENTIAL/PLATELET
Basophils Absolute: 0.1 10*3/uL (ref 0.0–0.1)
Basophils Relative: 1 % (ref 0–1)
Eosinophils Absolute: 0.3 10*3/uL (ref 0.0–0.7)
Eosinophils Relative: 3 % (ref 0–5)
HCT: 39.7 % (ref 39.0–52.0)
Hemoglobin: 13.9 g/dL (ref 13.0–17.0)
Lymphocytes Relative: 33 % (ref 12–46)
Lymphs Abs: 3.5 10*3/uL (ref 0.7–4.0)
MCH: 33.8 pg (ref 26.0–34.0)
MCHC: 35 g/dL (ref 30.0–36.0)
MCV: 96.6 fL (ref 78.0–100.0)
Monocytes Absolute: 1.1 10*3/uL — ABNORMAL HIGH (ref 0.1–1.0)
Monocytes Relative: 10 % (ref 3–12)
Neutro Abs: 5.6 10*3/uL (ref 1.7–7.7)
Neutrophils Relative %: 53 % (ref 43–77)
Platelets: 212 10*3/uL (ref 150–400)
RBC: 4.11 MIL/uL — ABNORMAL LOW (ref 4.22–5.81)
RDW: 13.6 % (ref 11.5–15.5)
WBC: 10.5 10*3/uL (ref 4.0–10.5)

## 2013-07-27 LAB — URINALYSIS, ROUTINE W REFLEX MICROSCOPIC
Bilirubin Urine: NEGATIVE
Glucose, UA: NEGATIVE mg/dL
Hgb urine dipstick: NEGATIVE
Ketones, ur: NEGATIVE mg/dL
Leukocytes, UA: NEGATIVE
Nitrite: NEGATIVE
Protein, ur: NEGATIVE mg/dL
Specific Gravity, Urine: 1.02 (ref 1.005–1.030)
Urobilinogen, UA: 0.2 mg/dL (ref 0.0–1.0)
pH: 5.5 (ref 5.0–8.0)

## 2013-07-27 LAB — COMPREHENSIVE METABOLIC PANEL
ALT: 15 U/L (ref 0–53)
AST: 14 U/L (ref 0–37)
Albumin: 3.8 g/dL (ref 3.5–5.2)
Alkaline Phosphatase: 102 U/L (ref 39–117)
BUN: 20 mg/dL (ref 6–23)
CO2: 30 mEq/L (ref 19–32)
Calcium: 9.3 mg/dL (ref 8.4–10.5)
Chloride: 104 mEq/L (ref 96–112)
Creatinine, Ser: 1.15 mg/dL (ref 0.50–1.35)
GFR calc Af Amer: 76 mL/min — ABNORMAL LOW (ref >90)
GFR calc non Af Amer: 65 mL/min — ABNORMAL LOW (ref >90)
Glucose, Bld: 128 mg/dL — ABNORMAL HIGH (ref 70–99)
Potassium: 4.8 mEq/L (ref 3.7–5.3)
Sodium: 143 mEq/L (ref 137–147)
Total Bilirubin: 0.3 mg/dL (ref 0.3–1.2)
Total Protein: 6.9 g/dL (ref 6.0–8.3)

## 2013-07-27 MED ORDER — CHLORHEXIDINE GLUCONATE 4 % EX LIQD: 60.0000 mL | Freq: Once | CUTANEOUS | Status: DC

## 2013-07-27 NOTE — Progress Notes (Signed)
07/27/13 0918  OBSTRUCTIVE SLEEP APNEA  Have you ever been diagnosed with sleep apnea through a sleep study? Yes  If yes, do you have and use a CPAP or BPAP machine every night? 0 (supposed to wear CPAP machine but doesn't have it right now)  Do you snore loudly (loud enough to be heard through closed doors)?  1  Do you often feel tired, fatigued, or sleepy during the daytime? 0  Has anyone observed you stop breathing during your sleep? 1  Do you have, or are you being treated for high blood pressure? 1  BMI more than 35 kg/m2? 1  Age over 64 years old? 1  Neck circumference greater than 40 cm/16 inches? 1  Gender: 1  Obstructive Sleep Apnea Score 7

## 2013-07-27 NOTE — Patient Instructions (Addendum)
Max SereneJerry D Allcock  07/27/2013   Your procedure is scheduled on:  07/28/13  [facility].  Call this number if you have problems the morning of surgery: [phone]   Remember:   Do not eat food or drink liquids after midnight.   Take these medicines the morning of surgery with A SIP OF WATER: Lisinopril/HCTZ, Gabapentin, Clonazepam and Lexapro. You may take your Oxycodone if needed.   Do not wear jewelry, make-up or nail polish.  Do not wear lotions, powders, or perfumes. You may wear deodorant.  Do not shave 48 hours prior to surgery. Men may shave face and neck.  Do not bring valuables to the hospital.  [facility] any belongings or valuables.               Contacts, dentures or bridgework may not be worn into surgery.  Leave suitcase in the car. After surgery it may be brought to your room.  For patients admitted to the hospital, discharge time is determined by your treatment team.               Patients discharged the day of surgery will not be allowed to drive home.    Special Instructions: Shower using Hibiclens the night before surgery and the morning of surgery.   Please read over the following fact sheets that you were given: Anesthesia Post-op Instructions and Care and Recovery After Surgery    Arthroscopic Procedure, Knee An arthroscopic procedure can find what is wrong with your knee. PROCEDURE Arthroscopy is a surgical technique that allows your orthopedic surgeon to diagnose and treat your knee injury with accuracy. They will look into your knee through a small instrument. This is almost like a small (pencil sized) telescope. Because arthroscopy affects your knee less than open knee surgery, you can anticipate a more rapid recovery. Taking an active role by following your caregiver's instructions will help with rapid and complete recovery. Use crutches, rest, elevation, ice, and knee exercises as instructed. The length of recovery  depends on various factors including type of injury, age, physical condition, medical conditions, and your rehabilitation. Your knee is the joint between the large bones (femur and tibia) in your leg. Cartilage covers these bone ends which are smooth and slippery and allow your knee to bend and move smoothly. Two menisci, thick, semi-lunar shaped pads of cartilage which form a rim inside the joint, help absorb shock and stabilize your knee. Ligaments bind the bones together and support your knee joint. Muscles move the joint, help support your knee, and take stress off the joint itself. Because of this all programs and physical therapy to rehabilitate an injured or repaired knee require rebuilding and strengthening your muscles. AFTER THE PROCEDURE  After the procedure, you will be moved to a recovery area until most of the effects of the medication have worn off. Your caregiver will discuss the test results with you.  Only take over-the-counter or prescription medicines for pain, discomfort, or fever as directed by your caregiver. SEEK MEDICAL CARE IF:   You have increased bleeding from your wounds.  You see redness, swelling, or have increasing pain in your wounds.  You have pus coming from your wound.  You have an oral temperature above 102 F (38.9 C).  You notice a bad smell coming from the wound or dressing.  You have severe pain with any motion of your knee. SEEK IMMEDIATE MEDICAL CARE IF:   You  develop a rash.  You have difficulty breathing.  You have any allergic problems. Document Released: 01/13/2000 Document Revised: 04/09/2011 Document Reviewed: 08/06/2007 [facility] Patient Information 2015 [place], [place]. This information is not intended to replace advice given to you by your health care provider. Make sure you discuss any questions you have with your health care provider.    Arthroscopic Procedure, Knee, Care After Refer to this sheet in the next few weeks. These  discharge instructions provide you with general information on caring for yourself after you leave the hospital. Your health care provider may also give you specific instructions. Your treatment has been planned according to the most current medical practices available, but unavoidable complications sometimes occur. If you have any problems or questions after discharge, please call your health care provider. HOME CARE INSTRUCTIONS   It is normal to be sore for a couple days after surgery. See your health care provider if this seems to be getting worse rather than better.  Only take over-the-counter or prescription medicines for pain, discomfort, or fever as directed by your health care provider.  Take showers rather than baths, or as directed by your health care provider.  Change bandages (dressings) if necessary or as directed.  You may resume normal diet and activities as directed or allowed.  Avoid lifting and driving until you are directed otherwise.  Make an appointment to see your health care provider for stitches (suture) or staple removal as directed.  You may put ice on the area.  Put the ice in a plastic bag. Place a towel between your skin and the bag.  Leave the ice on for 15-20 minutes, three to four times per day for the first 2 days.  Elevate the knee above the level of your heart to reduce swelling, and avoid dangling the leg.  Do 10-15 ankle pumps (pointing your toes toward you and then away from you) two to three times daily.  If you are given compression stockings to wear after surgery, use them for as long as your surgeon tells you (around 10-14 days).  Avoid smoking and exposure to secondhand smoke. SEEK MEDICAL CARE IF:   You have increased bleeding from your wounds.  You see redness or swelling or you have increasing pain in your wounds.  You have pus coming from your wound.  You have a fever or persistent symptoms for more than 2-3 days.  You notice a  bad smell coming from the wound or dressing.  You have severe pain with any motion of your knee. SEEK IMMEDIATE MEDICAL CARE IF:   You develop a rash.  You have difficulty breathing.  You develop any reaction or side effects to medicines taken.  You develop pain in the calves or back of the knee.  You develop chest pain, shortness of breath, or difficulty breathing.  You develop numbness or tingling in the leg or foot. MAKE SURE YOU:   Understand these instructions.  Will watch your condition.  Will get help right away if you are not doing well or you get worse. Document Released: 08/04/2004 Document Revised: 01/20/2013 Document Reviewed: 06/12/2012 [facility] Patient Information 2015 [place], [place]. This information is not intended to replace advice given to you by your health care provider. Make sure you discuss any questions you have with your health care provider.    PATIENT INSTRUCTIONS POST-ANESTHESIA  IMMEDIATELY FOLLOWING SURGERY:  Do not drive or operate machinery for the first twenty four hours after surgery.  Do not make any  important decisions for twenty four hours after surgery or while taking narcotic pain medications or sedatives.  If you develop intractable nausea and vomiting or a severe headache please notify your doctor immediately.  FOLLOW-UP:  Please make an appointment with your surgeon as instructed. You do not need to follow up with anesthesia unless specifically instructed to do so.  WOUND CARE INSTRUCTIONS (if applicable):  Keep a dry clean dressing on the anesthesia/puncture wound site if there is drainage.  Once the wound has quit draining you may leave it open to air.  Generally you should leave the bandage intact for twenty four hours unless there is drainage.  If the epidural site drains for more than 36-48 hours please call the anesthesia department.  QUESTIONS?:  Please feel free to call your physician or the hospital operator if you have any  questions, and they will be happy to assist you.
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
## 2013-07-28 ENCOUNTER — Encounter (HOSPITAL_COMMUNITY): Payer: Medicaid Other | Admitting: Anesthesiology

## 2013-07-28 ENCOUNTER — Encounter (HOSPITAL_COMMUNITY)
Admission: RE | Disposition: A | Discharge status: Home / Self Care | Payer: Self-pay | Source: Ambulatory Visit | Attending: Orthopaedic Surgery

## 2013-07-28 ENCOUNTER — Ambulatory Visit (HOSPITAL_COMMUNITY): Payer: Medicaid Other | Admitting: Anesthesiology

## 2013-07-28 ENCOUNTER — Encounter (HOSPITAL_COMMUNITY): Payer: Self-pay | Admitting: *Deleted

## 2013-07-28 ENCOUNTER — Ambulatory Visit (HOSPITAL_COMMUNITY)
Admission: RE | Admit: 2013-07-28 | Discharge: 2013-07-28 | Disposition: A | Discharge status: Home / Self Care | Payer: Medicaid Other | Source: Ambulatory Visit | Attending: Orthopaedic Surgery | Admitting: Orthopaedic Surgery

## 2013-07-28 DIAGNOSIS — X58XXXA Exposure to other specified factors, initial encounter: Secondary | ICD-10-CM | POA: Insufficient documentation

## 2013-07-28 DIAGNOSIS — Z79899 Other long term (current) drug therapy: Secondary | ICD-10-CM | POA: Insufficient documentation

## 2013-07-28 DIAGNOSIS — E669 Obesity, unspecified: Secondary | ICD-10-CM | POA: Insufficient documentation

## 2013-07-28 DIAGNOSIS — F17200 Nicotine dependence, unspecified, uncomplicated: Secondary | ICD-10-CM | POA: Insufficient documentation

## 2013-07-28 DIAGNOSIS — IMO0002 Reserved for concepts with insufficient information to code with codable children: Secondary | ICD-10-CM | POA: Insufficient documentation

## 2013-07-28 DIAGNOSIS — F411 Generalized anxiety disorder: Secondary | ICD-10-CM | POA: Insufficient documentation

## 2013-07-28 DIAGNOSIS — I10 Essential (primary) hypertension: Secondary | ICD-10-CM | POA: Insufficient documentation

## 2013-07-28 HISTORY — PX: KNEE ARTHROSCOPY WITH MEDIAL MENISECTOMY: SHX5651

## 2013-07-28 SURGERY — ARTHROSCOPY, KNEE, WITH MEDIAL MENISCECTOMY
Anesthesia: General | Laterality: Left

## 2013-07-28 MED ORDER — PROPOFOL 10 MG/ML IV BOLUS
INTRAVENOUS | Status: DC | PRN
  Administered 2013-07-28: 150 mg via INTRAVENOUS

## 2013-07-28 MED ORDER — MIDAZOLAM HCL 5 MG/5ML IJ SOLN
INTRAMUSCULAR | Status: DC | PRN
  Administered 2013-07-28: 2 mg via INTRAVENOUS

## 2013-07-28 MED ORDER — SODIUM CHLORIDE 0.9 % IR SOLN
Status: DC | PRN
  Administered 2013-07-28: 1000 mL

## 2013-07-28 MED ORDER — GLYCOPYRROLATE 0.2 MG/ML IJ SOLN
INTRAMUSCULAR | Status: AC
  Filled 2013-07-28: qty 1

## 2013-07-28 MED ORDER — PROPOFOL 10 MG/ML IV BOLUS
INTRAVENOUS | Status: AC
  Filled 2013-07-28: qty 20

## 2013-07-28 MED ORDER — EPHEDRINE SULFATE 50 MG/ML IJ SOLN
INTRAMUSCULAR | Status: DC | PRN
  Administered 2013-07-28 (×2): 10 mg via INTRAVENOUS

## 2013-07-28 MED ORDER — FENTANYL CITRATE 0.05 MG/ML IJ SOLN: 25.0000 ug | INTRAMUSCULAR | Status: DC | PRN

## 2013-07-28 MED ORDER — FENTANYL CITRATE 0.05 MG/ML IJ SOLN
INTRAMUSCULAR | Status: DC | PRN
  Administered 2013-07-28 (×6): 50 ug via INTRAVENOUS

## 2013-07-28 MED ORDER — FENTANYL CITRATE 0.05 MG/ML IJ SOLN
INTRAMUSCULAR | Status: AC
  Filled 2013-07-28: qty 2

## 2013-07-28 MED ORDER — BUPIVACAINE HCL (PF) 0.25 % IJ SOLN
INTRAMUSCULAR | Status: AC
  Filled 2013-07-28: qty 30

## 2013-07-28 MED ORDER — LACTATED RINGERS IV SOLN
INTRAVENOUS | Status: DC
  Administered 2013-07-28: 07:00:00 via INTRAVENOUS

## 2013-07-28 MED ORDER — SUCCINYLCHOLINE CHLORIDE 20 MG/ML IJ SOLN
INTRAMUSCULAR | Status: AC
  Filled 2013-07-28: qty 1

## 2013-07-28 MED ORDER — GLYCOPYRROLATE 0.2 MG/ML IJ SOLN
0.2000 mg | Freq: Once | INTRAMUSCULAR | Status: AC
  Administered 2013-07-28: 0.2 mg via INTRAVENOUS

## 2013-07-28 MED ORDER — DEXAMETHASONE SODIUM PHOSPHATE 4 MG/ML IJ SOLN
4.0000 mg | Freq: Once | INTRAMUSCULAR | Status: AC
  Administered 2013-07-28: 4 mg via INTRAVENOUS

## 2013-07-28 MED ORDER — ONDANSETRON HCL 4 MG/2ML IJ SOLN
4.0000 mg | Freq: Once | INTRAMUSCULAR | Status: AC
  Administered 2013-07-28: 4 mg via INTRAVENOUS

## 2013-07-28 MED ORDER — ONDANSETRON HCL 4 MG/2ML IJ SOLN: 4.0000 mg | Freq: Once | INTRAMUSCULAR | Status: DC | PRN

## 2013-07-28 MED ORDER — LIDOCAINE HCL (PF) 1 % IJ SOLN
INTRAMUSCULAR | Status: AC
  Filled 2013-07-28: qty 5

## 2013-07-28 MED ORDER — BUPIVACAINE HCL (PF) 0.25 % IJ SOLN
INTRAMUSCULAR | Status: DC | PRN
  Administered 2013-07-28: 30 mL

## 2013-07-28 MED ORDER — SODIUM CHLORIDE 0.9 % IJ SOLN
INTRAMUSCULAR | Status: AC
  Filled 2013-07-28: qty 10

## 2013-07-28 MED ORDER — MIDAZOLAM HCL 2 MG/2ML IJ SOLN
INTRAMUSCULAR | Status: AC
  Filled 2013-07-28: qty 2

## 2013-07-28 MED ORDER — LACTATED RINGERS IR SOLN
Status: DC | PRN
  Administered 2013-07-28 (×3): 3000 mL

## 2013-07-28 MED ORDER — MIDAZOLAM HCL 2 MG/2ML IJ SOLN
1.0000 mg | INTRAMUSCULAR | Status: DC | PRN
  Administered 2013-07-28: 2 mg via INTRAVENOUS

## 2013-07-28 MED ORDER — DEXAMETHASONE SODIUM PHOSPHATE 4 MG/ML IJ SOLN
INTRAMUSCULAR | Status: AC
  Filled 2013-07-28: qty 1

## 2013-07-28 MED ORDER — LACTATED RINGERS IV SOLN
INTRAVENOUS | Status: DC | PRN
  Administered 2013-07-28 (×2): via INTRAVENOUS

## 2013-07-28 MED ORDER — ONDANSETRON HCL 4 MG/2ML IJ SOLN
INTRAMUSCULAR | Status: AC
  Filled 2013-07-28: qty 2

## 2013-07-28 MED ORDER — LIDOCAINE HCL (CARDIAC) 20 MG/ML IV SOLN
INTRAVENOUS | Status: DC | PRN
  Administered 2013-07-28: 50 mg via INTRAVENOUS

## 2013-07-28 MED ORDER — EPHEDRINE SULFATE 50 MG/ML IJ SOLN
INTRAMUSCULAR | Status: AC
  Filled 2013-07-28: qty 1

## 2013-07-28 MED ORDER — FENTANYL CITRATE 0.05 MG/ML IJ SOLN
25.0000 ug | INTRAMUSCULAR | Status: AC
  Administered 2013-07-28 (×2): 25 ug via INTRAVENOUS

## 2013-07-28 SURGICAL SUPPLY — 44 items
BAG HAMPER (MISCELLANEOUS) ×2 IMPLANT
BANDAGE ELASTIC 6 VELCRO NS (GAUZE/BANDAGES/DRESSINGS) ×2 IMPLANT
BANDAGE ESMARK 4X12 BL STRL LF (DISPOSABLE) ×1 IMPLANT
BLADE SURG 15 STRL LF DISP TIS (BLADE) ×1 IMPLANT
BLADE SURG 15 STRL SS (BLADE) ×1
BLADE SURG SZ11 CARB STEEL (BLADE) ×2 IMPLANT
BNDG ESMARK 4X12 BLUE STRL LF (DISPOSABLE) ×2
CLOTH BEACON ORANGE TIMEOUT ST (SAFETY) ×2 IMPLANT
CUFF TOURNIQUET SINGLE 34IN LL (TOURNIQUET CUFF) ×2 IMPLANT
CUTTER TOMCAT 4.0M (BURR) ×2 IMPLANT
DECANTER SPIKE VIAL GLASS SM (MISCELLANEOUS) ×2 IMPLANT
DRAPE PROXIMA HALF (DRAPES) ×2 IMPLANT
DRSG XEROFORM 1X8 (GAUZE/BANDAGES/DRESSINGS) ×2 IMPLANT
DURAPREP 26ML APPLICATOR (WOUND CARE) ×2 IMPLANT
FORMALIN 10 PREFIL 480ML (MISCELLANEOUS) ×2 IMPLANT
GAUZE SPONGE 4X4 12PLY STRL (GAUZE/BANDAGES/DRESSINGS) ×2 IMPLANT
GAUZE SPONGE 4X4 16PLY XRAY LF (GAUZE/BANDAGES/DRESSINGS) ×2 IMPLANT
GAUZE XEROFORM 5X9 LF (GAUZE/BANDAGES/DRESSINGS) ×2 IMPLANT
GLOVE BIO SURGEON STRL SZ8 (GLOVE) ×2 IMPLANT
GLOVE BIO SURGEON STRL SZ8.5 (GLOVE) ×2 IMPLANT
GOWN STRL REUS W/TWL LRG LVL3 (GOWN DISPOSABLE) ×2 IMPLANT
GOWN STRL REUS W/TWL XL LVL3 (GOWN DISPOSABLE) ×2 IMPLANT
HLDR LEG FOAM (MISCELLANEOUS) ×1 IMPLANT
IV LACTATED RINGER IRRG 3000ML (IV SOLUTION) ×3
IV LR IRRIG 3000ML ARTHROMATIC (IV SOLUTION) ×3 IMPLANT
KIT BLADEGUARD II DBL (SET/KITS/TRAYS/PACK) ×2 IMPLANT
KIT ROOM TURNOVER AP CYSTO (KITS) ×2 IMPLANT
LEG HOLDER FOAM (MISCELLANEOUS) ×1
MANIFOLD NEPTUNE II (INSTRUMENTS) ×2 IMPLANT
MARKER SKIN DUAL TIP RULER LAB (MISCELLANEOUS) ×2 IMPLANT
NEEDLE HYPO 21X1.5 SAFETY (NEEDLE) ×2 IMPLANT
NEEDLE SPNL 18GX3.5 QUINCKE PK (NEEDLE) ×2 IMPLANT
NS IRRIG 1000ML POUR BTL (IV SOLUTION) ×2 IMPLANT
PACK ARTHRO LIMB DRAPE STRL (MISCELLANEOUS) ×2 IMPLANT
PAD ABD 5X9 TENDERSORB (GAUZE/BANDAGES/DRESSINGS) ×2 IMPLANT
PAD ARMBOARD 7.5X6 YLW CONV (MISCELLANEOUS) ×2 IMPLANT
PADDING CAST COTTON 6X4 STRL (CAST SUPPLIES) ×2 IMPLANT
SET ARTHROSCOPY INST (INSTRUMENTS) ×2 IMPLANT
SET BASIN LINEN APH (SET/KITS/TRAYS/PACK) ×2 IMPLANT
SPONGE GAUZE 4X4 12PLY (GAUZE/BANDAGES/DRESSINGS) ×2 IMPLANT
SUT ETHILON 3 0 FSL (SUTURE) ×2 IMPLANT
SYR 30ML LL (SYRINGE) ×2 IMPLANT
TUBING FLOSTEADY ARTHRO PUMP (IRRIGATION / IRRIGATOR) ×2 IMPLANT
YANKAUER SUCT BULB TIP 10FT TU (MISCELLANEOUS) ×4 IMPLANT

## 2013-07-28 NOTE — Anesthesia Preprocedure Evaluation (Signed)
Anesthesia Evaluation  Patient identified by MRN, date of birth, ID band Patient awake    Reviewed: Allergy & Precautions, H&P , NPO status , Patient's Chart, lab work & pertinent test results  Airway Mallampati: II TM Distance: >3 FB Neck ROM: Full    Dental  (+) Teeth Intact   Pulmonary sleep apnea , Current Smoker,  breath sounds clear to auscultation        Cardiovascular hypertension, Pt. on medications Rhythm:Regular Rate:Normal     Neuro/Psych PSYCHIATRIC DISORDERS (PTSD) Anxiety Depression  Neuromuscular disease    GI/Hepatic   Endo/Other  Morbid obesity  Renal/GU      Musculoskeletal   Abdominal   Peds  Hematology   Anesthesia Other Findings   Reproductive/Obstetrics                           Anesthesia Physical Anesthesia Plan  ASA: III  Anesthesia Plan: General   Post-op Pain Management:    Induction: Intravenous  Airway Management Planned: LMA  Additional Equipment:   Intra-op Plan:   Post-operative Plan: Extubation in OR  Informed Consent: I have reviewed the patients History and Physical, chart, labs and discussed the procedure including the risks, benefits and alternatives for the proposed anesthesia with the patient or authorized representative who has indicated his/her understanding and acceptance.     Plan Discussed with:   Anesthesia Plan Comments:         Anesthesia Quick Evaluation

## 2013-07-28 NOTE — Anesthesia Procedure Notes (Signed)
Procedure Name: LMA Insertion Date/Time: 07/28/2013 7:38 AM Performed by: Pernell DupreADAMS, AMY A Pre-anesthesia Checklist: Patient identified, Timeout performed, Emergency Drugs available, Suction available and Patient being monitored Patient Re-evaluated:Patient Re-evaluated prior to inductionOxygen Delivery Method: Circle system utilized Preoxygenation: Pre-oxygenation with 100% oxygen Intubation Type: IV induction Ventilation: Mask ventilation without difficulty LMA Size: 5.0 Number of attempts: 1 Placement Confirmation: positive ETCO2 and breath sounds checked- equal and bilateral Tube secured with: Tape Dental Injury: Teeth and Oropharynx as per pre-operative assessment

## 2013-07-28 NOTE — Progress Notes (Signed)
The History and Physical is unchanged. I have examined the patient. The patient is medically able to have surgery on the left knee . Kebin Maye  

## 2013-07-28 NOTE — Brief Op Note (Signed)
07/28/2013  8:27 AM  PATIENT:  Max [name]  63 y.o. male  PRE-OPERATIVE DIAGNOSIS:  tear medial meniscus left knee  POST-OPERATIVE DIAGNOSIS:  tear medial meniscus left knee  PROCEDURE:  Procedure(s): LEFT KNEE ARTHROSCOPY WITH PARTIAL MEDIAL MENISECTOMY (Left)  SURGEON:  Surgeon(s) and Role:    * Darreld McleanWayne Maurya Nethery, MD - Primary  PHYSICIAN ASSISTANT:   ASSISTANTS: none   ANESTHESIA:   general  EBL:  Total I/O In: 1000 [I.V.:1000] Out: -   BLOOD ADMINISTERED:none  DRAINS: none   LOCAL MEDICATIONS USED:  MARCAINE     SPECIMEN:  Source of Specimen:  Medial meniscus shavings from left knee  DISPOSITION OF SPECIMEN:  PATHOLOGY  COUNTS:  YES  TOURNIQUET:  * Missing tourniquet times found for documented tourniquets in log:  [id number] *  DICTATION: .Other Dictation: Dictation Number [id number]  PLAN OF CARE: Discharge to home after PACU  PATIENT DISPOSITION:  PACU - hemodynamically stable.   Delay start of Pharmacological VTE agent (>24hrs) due to surgical blood loss or risk of bleeding: not applicable

## 2013-07-28 NOTE — Anesthesia Postprocedure Evaluation (Signed)
  Anesthesia Post-op Note  Patient: Max [name]  Procedure(s) Performed: Procedure(s): LEFT KNEE ARTHROSCOPY WITH PARTIAL MEDIAL MENISECTOMY (Left)  Patient Location: PACU  Anesthesia Type:General  Level of Consciousness: awake, alert , oriented and patient cooperative  Airway and Oxygen Therapy: Patient Spontanous Breathing and Patient connected to face mask oxygen  Post-op Pain: none  Post-op Assessment: Post-op Vital signs reviewed, Patient's Cardiovascular Status Stable, Respiratory Function Stable, Patent Airway, No signs of Nausea or vomiting and Pain level controlled  Post-op Vital Signs: Reviewed and stable  Last Vitals:  Filed Vitals:   07/28/13 0840  BP: 114/71  Pulse: 91  Temp: 36.6 C  Resp: 16    Complications: No apparent anesthesia complications

## 2013-07-28 NOTE — Transfer of Care (Signed)
Immediate Anesthesia Transfer of Care Note  Patient: Max SereneJerry D Barclift  Procedure(s) Performed: Procedure(s): LEFT KNEE ARTHROSCOPY WITH PARTIAL MEDIAL MENISECTOMY (Left)  Patient Location: PACU  Anesthesia Type:General  Level of Consciousness: awake, alert , oriented and patient cooperative  Airway & Oxygen Therapy: Patient Spontanous Breathing and Patient connected to face mask oxygen  Post-op Assessment: Report given to PACU RN and Post -op Vital signs reviewed and stable  Post vital signs: Reviewed and stable  Complications: No apparent anesthesia complications

## 2013-07-28 NOTE — Op Note (Signed)
NAME:  Max [name], Max                ACCOUNT NO.:  [id number]  MEDICAL RECORD NO.:  [id number]  LOCATION:  [facility]                          FACILITY:  [facility]  PHYSICIAN:  Max [name], M.D. DATE OF BIRTH:  1950-02-03  DATE OF PROCEDURE:  07/28/2013 DATE OF DISCHARGE:                              OPERATIVE REPORT   PREOPERATIVE DIAGNOSIS:  Tear left knee medial meniscus.  POSTOPERATIVE DIAGNOSIS:  Tear left knee medial meniscus.  PROCEDURE:  Operative arthroscopy, partial medial meniscectomy.  ANESTHESIA:  General.  TOURNIQUET TIME:  22 minutes.  DRAINS:  None.  SURGEON:  J. Darreld McleanWayne Keeling, MD  INDICATIONS:  The patient has been having pain and tenderness of his knees since April.  He had been in the emergency room, had a Doppler study because he has some swelling around the knee on the left.  The Doppler was negative.  I saw him in the office.  She initially got better with NSAIDs but the pain continued and he had more swelling and more recently giving way.  MRI on July 21, 2013, showed a tear of the medial meniscus of the left knee medially posterior horn.  I recommended surgery.  As his knee kept giving away unexpectedly and the pain continued, he agreed to the arthroscopy procedure.  I went over the procedure with him the risk and imponderables.  He appeared to understand.  He asked appropriate questions.  Surgery was electively scheduled for today per his request.  DESCRIPTION OF PROCEDURE:  The patient was seen in the holding area, and the left knee was identified as the correct surgical site.  I placed a mark on the left knee.  I talked to his wife.  I talked to him prior to the procedure.  The patient was brought to the operating room and placed supine on the operating room table.  He was given general anesthesia. Deflated tourniquet and leg holder placed on left upper thigh.  The patient prepped and draped in usual manner.  A generalized time-out identifying the  patient as [name].  They were doing his left knee for left knee arthroscopy.  All instrumentations were properly positioned and working.  Everyone in the room knew each other.  Leg was elevated, wrapped circumferentially with an Esmarch bandage. Tourniquet inflated to 300 mmHg.  Esmarch bandage removed.  Inflow cannula was inserted medially and lactated Ringer's instilled into the knee by an infusion pump.  Arthroscope inserted laterally and the knee systematically examined.  Suprapatellar pouch looked normal. Undersurface of  the patella some mild grade 2 changes, medially there were grade 2 to grade 3 changes in the medial femoral condyle, some grade 2 changes on the tibial plateau, a tear in the posterior horn of the medial meniscus.  Anterior cruciate was normal.  Lateral meniscus and lateral articular surfaces looked normal.  Attention directed back to the medial side.  Using meniscal shaver and meniscal punch, the tear in the posterior horn of the meniscus was removed and made smooth.  Permanent pictures were taken.  The knee was then reinspected and no new pathology found.  Knee was irrigated with main part of lactated Ringer's. Wounds were reapproximated using  3-0 nylon interrupted vertical mattress manner.  Marcaine 0.25% instilled in each portal wound.  Tourniquet deflated to 22 minutes.  Sterile dressing applied.  Bulky dressing applied.  The patient will go to recovery in good condition.  The patient already has pain medicine at home, he is to continue this.  If any difficulty, always contact me through the office hospital beeper system.  Physical therapy has also been arranged.          ______________________________ Shela CommonsJ. Darreld McleanWayne [name], M.D.     JWK/MEDQ  D:  07/28/2013  T:  07/28/2013  Job:  [id number]

## 2013-07-28 NOTE — Discharge Instructions (Signed)
Use ice.  Use crutches or walker.  Weight bear as tolerated.  Elevate leg as needed.  You may remove dressing from the left knee when you desire.  You may rinse with peroxide but otherwise keep the wounds dry.  You may apply Band-aids to the stitches.  Keep physical therapy appointment.  Keep appointment with Dr. Hilda LiasKeeling.  If any problem, call his office at [phone].  If after hours, call the hospital at [phone].  Try to be as active as you can be.   Arthroscopic Procedure, Knee, Care After Refer to this sheet in the next few weeks. These discharge instructions provide you with general information on caring for yourself after you leave the hospital. Your health care provider may also give you specific instructions. Your treatment has been planned according to the most current medical practices available, but unavoidable complications sometimes occur. If you have any problems or questions after discharge, please call your health care provider. HOME CARE INSTRUCTIONS   It is normal to be sore for a couple days after surgery. See your health care provider if this seems to be getting worse rather than better.  Only take over-the-counter or prescription medicines for pain, discomfort, or fever as directed by your health care provider.  Take showers rather than baths, or as directed by your health care provider.  Change bandages (dressings) if necessary or as directed.  You may resume normal diet and activities as directed or allowed.  Avoid lifting and driving until you are directed otherwise.  Make an appointment to see your health care provider for stitches (suture) or staple removal as directed.  You may put ice on the area.  Put the ice in a plastic bag. Place a towel between your skin and the bag.  Leave the ice on for 15-20 minutes, three to four times per day for the first 2 days.  Elevate the knee above the level of your heart to reduce swelling, and avoid dangling the  leg.  Do 10-15 ankle pumps (pointing your toes toward you and then away from you) two to three times daily.  If you are given compression stockings to wear after surgery, use them for as long as your surgeon tells you (around 10-14 days).  Avoid smoking and exposure to secondhand smoke. SEEK MEDICAL CARE IF:   You have increased bleeding from your wounds.  You see redness or swelling or you have increasing pain in your wounds.  You have pus coming from your wound.  You have a fever or persistent symptoms for more than 2-3 days.  You notice a bad smell coming from the wound or dressing.  You have severe pain with any motion of your knee. SEEK IMMEDIATE MEDICAL CARE IF:   You develop a rash.  You have difficulty breathing.  You develop any reaction or side effects to medicines taken.  You develop pain in the calves or back of the knee.  You develop chest pain, shortness of breath, or difficulty breathing.  You develop numbness or tingling in the leg or foot. MAKE SURE YOU:   Understand these instructions.  Will watch your condition.  Will get help right away if you are not doing well or you get worse. Document Released: 08/04/2004 Document Revised: 01/20/2013 Document Reviewed: 06/12/2012 [facility] Patient Information 2015 [place], [place]. This information is not intended to replace advice given to you by your health care provider. Make sure you discuss any questions you have with your health care provider.  PATIENT INSTRUCTIONS POST-ANESTHESIA  IMMEDIATELY FOLLOWING SURGERY:  Do not drive or operate machinery for the first twenty four hours after surgery.  Do not make any important decisions for twenty four hours after surgery or while taking narcotic pain medications or sedatives.  If you develop intractable nausea and vomiting or a severe headache please notify your doctor immediately.  FOLLOW-UP:  Please make an appointment with your surgeon as instructed. You do  not need to follow up with anesthesia unless specifically instructed to do so.  WOUND CARE INSTRUCTIONS (if applicable):  Keep a dry clean dressing on the anesthesia/puncture wound site if there is drainage.  Once the wound has quit draining you may leave it open to air.  Generally you should leave the bandage intact for twenty four hours unless there is drainage.  If the epidural site drains for more than 36-48 hours please call the anesthesia department.  QUESTIONS?:  Please feel free to call your physician or the hospital operator if you have any questions, and they will be happy to assist you.

## 2013-07-30 ENCOUNTER — Ambulatory Visit (HOSPITAL_COMMUNITY)
Admission: RE | Admit: 2013-07-30 | Discharge: 2013-07-30 | Disposition: A | Discharge status: Home / Self Care | Payer: Medicaid Other | Source: Ambulatory Visit | Attending: Orthopaedic Surgery | Admitting: Orthopaedic Surgery

## 2013-07-30 DIAGNOSIS — M25569 Pain in unspecified knee: Secondary | ICD-10-CM | POA: Diagnosis not present

## 2013-07-30 DIAGNOSIS — M25669 Stiffness of unspecified knee, not elsewhere classified: Secondary | ICD-10-CM | POA: Insufficient documentation

## 2013-07-30 DIAGNOSIS — M25562 Pain in left knee: Secondary | ICD-10-CM

## 2013-07-30 DIAGNOSIS — IMO0001 Reserved for inherently not codable concepts without codable children: Secondary | ICD-10-CM | POA: Insufficient documentation

## 2013-07-30 DIAGNOSIS — M25469 Effusion, unspecified knee: Secondary | ICD-10-CM | POA: Diagnosis not present
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
## 2013-09-09 ENCOUNTER — Encounter (HOSPITAL_COMMUNITY): Payer: Self-pay | Admitting: Emergency Medicine

## 2013-09-09 ENCOUNTER — Emergency Department (HOSPITAL_COMMUNITY)
Admission: EM | Admit: 2013-09-09 | Discharge: 2013-09-09 | Disposition: A | Discharge status: Home / Self Care | Payer: Medicaid Other | Attending: Emergency Medicine | Admitting: Emergency Medicine

## 2013-09-09 DIAGNOSIS — Z8739 Personal history of other diseases of the musculoskeletal system and connective tissue: Secondary | ICD-10-CM | POA: Diagnosis not present

## 2013-09-09 DIAGNOSIS — S40269A Insect bite (nonvenomous) of unspecified shoulder, initial encounter: Secondary | ICD-10-CM | POA: Insufficient documentation

## 2013-09-09 DIAGNOSIS — Z791 Long term (current) use of non-steroidal anti-inflammatories (NSAID): Secondary | ICD-10-CM | POA: Diagnosis not present

## 2013-09-09 DIAGNOSIS — Y9389 Activity, other specified: Secondary | ICD-10-CM | POA: Insufficient documentation

## 2013-09-09 DIAGNOSIS — F411 Generalized anxiety disorder: Secondary | ICD-10-CM | POA: Insufficient documentation

## 2013-09-09 DIAGNOSIS — W57XXXA Bitten or stung by nonvenomous insect and other nonvenomous arthropods, initial encounter: Secondary | ICD-10-CM | POA: Insufficient documentation

## 2013-09-09 DIAGNOSIS — F17200 Nicotine dependence, unspecified, uncomplicated: Secondary | ICD-10-CM | POA: Diagnosis not present

## 2013-09-09 DIAGNOSIS — S90569A Insect bite (nonvenomous), unspecified ankle, initial encounter: Secondary | ICD-10-CM | POA: Insufficient documentation

## 2013-09-09 DIAGNOSIS — F3289 Other specified depressive episodes: Secondary | ICD-10-CM | POA: Diagnosis not present

## 2013-09-09 DIAGNOSIS — Y929 Unspecified place or not applicable: Secondary | ICD-10-CM | POA: Insufficient documentation

## 2013-09-09 DIAGNOSIS — S30860A Insect bite (nonvenomous) of lower back and pelvis, initial encounter: Secondary | ICD-10-CM | POA: Insufficient documentation

## 2013-09-09 DIAGNOSIS — Z79899 Other long term (current) drug therapy: Secondary | ICD-10-CM | POA: Diagnosis not present

## 2013-09-09 DIAGNOSIS — F4310 Post-traumatic stress disorder, unspecified: Secondary | ICD-10-CM | POA: Insufficient documentation

## 2013-09-09 DIAGNOSIS — I10 Essential (primary) hypertension: Secondary | ICD-10-CM | POA: Diagnosis not present

## 2013-09-09 DIAGNOSIS — F329 Major depressive disorder, single episode, unspecified: Secondary | ICD-10-CM | POA: Diagnosis not present

## 2013-09-09 DIAGNOSIS — R21 Rash and other nonspecific skin eruption: Secondary | ICD-10-CM | POA: Diagnosis present

## 2013-09-09 MED ORDER — PREDNISONE 20 MG PO TABS: 40.0000 mg | ORAL_TABLET | Freq: Every day | ORAL | Status: DC

## 2013-09-09 MED ORDER — DEXAMETHASONE SODIUM PHOSPHATE 10 MG/ML IJ SOLN
8.0000 mg | Freq: Once | INTRAMUSCULAR | Status: AC
  Administered 2013-09-09: 8 mg via INTRAMUSCULAR
  Filled 2013-09-09: qty 1

## 2013-09-09 MED ORDER — HYDROXYZINE HCL 25 MG PO TABS: 25.0000 mg | ORAL_TABLET | Freq: Four times a day (QID) | ORAL | Status: DC

## 2013-09-09 NOTE — ED Provider Notes (Signed)
Medical screening examination/treatment/procedure(s) were performed by non-physician practitioner and as supervising physician I was immediately available for consultation/collaboration.   EKG Interpretation None      Devoria AlbeIva Tiffeny Minchew, MD, Armando GangFACEP   Ward GivensIva L Trevian Hayashida, MD 09/09/13 [phone]
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
## 2013-09-09 NOTE — ED Notes (Signed)
C/o unknown rash/insect bites to bil legs, abd and neck. Reports sitting in field.

## 2013-09-09 NOTE — Discharge Instructions (Signed)
Insect Bite Mosquitoes, flies, fleas, bedbugs, and other insects can bite. Insect bites are different from insect stings. The bite may be red, puffy (swollen), and itchy for 2 to 4 days. Most bites get better on their own. HOME CARE   Do not scratch the bite.  Keep the bite clean and dry. Wash the bite with soap and water.  Put ice on the bite.  Put ice in a plastic bag.  Place a towel between your skin and the bag.  Leave the ice on for 20 minutes, 4 times a day. Do this for the first 2 to 3 days, or as told by your doctor.  You may use medicated lotions or creams to lessen itching as told by your doctor.  Only take medicines as told by your doctor.  If you are given medicines (antibiotics), take them as told. Finish them even if you start to feel better. You may need a tetanus shot if:  You cannot remember when you had your last tetanus shot.  You have never had a tetanus shot.  The injury broke your skin. If you need a tetanus shot and you choose not to have one, you may get tetanus. Sickness from tetanus can be serious. GET HELP RIGHT AWAY IF:   You have more pain, redness, or puffiness.  You see a red line on the skin coming from the bite.  You have a fever.  You have joint pain.  You have a headache or neck pain.  You feel weak.  You have a rash.  You have chest pain, or you are short of breath.  You have belly (abdominal) pain.  You feel sick to your stomach (nauseous) or throw up (vomit).  You feel very tired or sleepy. MAKE SURE YOU:   Understand these instructions.  Will watch your condition.  Will get help right away if you are not doing well or get worse. Document Released: 01/13/2000 Document Revised: 04/09/2011 Document Reviewed: 08/16/2010 [employer] Patient Information 2015 [employer]. This information is not intended to replace advice given to you by your health care provider. Make sure you discuss any questions you have with your health  care provider.  

## 2013-09-19 ENCOUNTER — Emergency Department (HOSPITAL_COMMUNITY): Payer: Medicaid Other

## 2013-09-19 ENCOUNTER — Encounter (HOSPITAL_COMMUNITY): Payer: Self-pay | Admitting: Emergency Medicine

## 2013-09-19 ENCOUNTER — Emergency Department (HOSPITAL_COMMUNITY)
Admission: EM | Admit: 2013-09-19 | Discharge: 2013-09-19 | Disposition: A | Discharge status: Home / Self Care | Payer: Medicaid Other | Attending: Emergency Medicine | Admitting: Emergency Medicine

## 2013-09-19 DIAGNOSIS — F3289 Other specified depressive episodes: Secondary | ICD-10-CM | POA: Diagnosis not present

## 2013-09-19 DIAGNOSIS — R05 Cough: Secondary | ICD-10-CM | POA: Insufficient documentation

## 2013-09-19 DIAGNOSIS — Z87828 Personal history of other (healed) physical injury and trauma: Secondary | ICD-10-CM | POA: Diagnosis not present

## 2013-09-19 DIAGNOSIS — Z79899 Other long term (current) drug therapy: Secondary | ICD-10-CM | POA: Diagnosis not present

## 2013-09-19 DIAGNOSIS — F329 Major depressive disorder, single episode, unspecified: Secondary | ICD-10-CM | POA: Insufficient documentation

## 2013-09-19 DIAGNOSIS — I10 Essential (primary) hypertension: Secondary | ICD-10-CM | POA: Insufficient documentation

## 2013-09-19 DIAGNOSIS — R109 Unspecified abdominal pain: Secondary | ICD-10-CM

## 2013-09-19 DIAGNOSIS — Z9981 Dependence on supplemental oxygen: Secondary | ICD-10-CM | POA: Diagnosis not present

## 2013-09-19 DIAGNOSIS — G4730 Sleep apnea, unspecified: Secondary | ICD-10-CM | POA: Insufficient documentation

## 2013-09-19 DIAGNOSIS — R059 Cough, unspecified: Secondary | ICD-10-CM | POA: Insufficient documentation

## 2013-09-19 DIAGNOSIS — R609 Edema, unspecified: Secondary | ICD-10-CM | POA: Insufficient documentation

## 2013-09-19 DIAGNOSIS — F4310 Post-traumatic stress disorder, unspecified: Secondary | ICD-10-CM | POA: Diagnosis not present

## 2013-09-19 DIAGNOSIS — G479 Sleep disorder, unspecified: Secondary | ICD-10-CM | POA: Insufficient documentation

## 2013-09-19 DIAGNOSIS — F411 Generalized anxiety disorder: Secondary | ICD-10-CM | POA: Diagnosis not present

## 2013-09-19 DIAGNOSIS — Z791 Long term (current) use of non-steroidal anti-inflammatories (NSAID): Secondary | ICD-10-CM | POA: Insufficient documentation

## 2013-09-19 DIAGNOSIS — F17200 Nicotine dependence, unspecified, uncomplicated: Secondary | ICD-10-CM | POA: Diagnosis not present

## 2013-09-19 LAB — CBC WITH DIFFERENTIAL/PLATELET
Basophils Absolute: 0.1 10*3/uL (ref 0.0–0.1)
Basophils Relative: 0 % (ref 0–1)
Eosinophils Absolute: 0.2 10*3/uL (ref 0.0–0.7)
Eosinophils Relative: 1 % (ref 0–5)
HCT: 39.6 % (ref 39.0–52.0)
Hemoglobin: 13.9 g/dL (ref 13.0–17.0)
Lymphocytes Relative: 20 % (ref 12–46)
Lymphs Abs: 3.3 10*3/uL (ref 0.7–4.0)
MCH: 33.3 pg (ref 26.0–34.0)
MCHC: 35.1 g/dL (ref 30.0–36.0)
MCV: 95 fL (ref 78.0–100.0)
Monocytes Absolute: 1.1 10*3/uL — ABNORMAL HIGH (ref 0.1–1.0)
Monocytes Relative: 7 % (ref 3–12)
Neutro Abs: 12.1 10*3/uL — ABNORMAL HIGH (ref 1.7–7.7)
Neutrophils Relative %: 72 % (ref 43–77)
Platelets: 202 10*3/uL (ref 150–400)
RBC: 4.17 MIL/uL — ABNORMAL LOW (ref 4.22–5.81)
RDW: 13.5 % (ref 11.5–15.5)
WBC: 16.8 10*3/uL — ABNORMAL HIGH (ref 4.0–10.5)

## 2013-09-19 LAB — BASIC METABOLIC PANEL
Anion gap: 14 (ref 5–15)
BUN: 30 mg/dL — ABNORMAL HIGH (ref 6–23)
CO2: 23 mEq/L (ref 19–32)
Calcium: 9.3 mg/dL (ref 8.4–10.5)
Chloride: 101 mEq/L (ref 96–112)
Creatinine, Ser: 1.25 mg/dL (ref 0.50–1.35)
GFR calc Af Amer: 69 mL/min — ABNORMAL LOW (ref >90)
GFR calc non Af Amer: 59 mL/min — ABNORMAL LOW (ref >90)
Glucose, Bld: 104 mg/dL — ABNORMAL HIGH (ref 70–99)
Potassium: 4.1 mEq/L (ref 3.7–5.3)
Sodium: 138 mEq/L (ref 137–147)

## 2013-09-19 LAB — URINALYSIS, ROUTINE W REFLEX MICROSCOPIC
Bilirubin Urine: NEGATIVE
Glucose, UA: NEGATIVE mg/dL
Hgb urine dipstick: NEGATIVE
Leukocytes, UA: NEGATIVE
Nitrite: NEGATIVE
Protein, ur: NEGATIVE mg/dL
Specific Gravity, Urine: 1.025 (ref 1.005–1.030)
Urobilinogen, UA: 0.2 mg/dL (ref 0.0–1.0)
pH: 6 (ref 5.0–8.0)

## 2013-09-19 MED ORDER — DIAZEPAM 5 MG PO TABS: 5.0000 mg | ORAL_TABLET | Freq: Four times a day (QID) | ORAL | Status: DC | PRN

## 2013-09-19 MED ORDER — HYDROMORPHONE HCL PF 1 MG/ML IJ SOLN
1.0000 mg | Freq: Once | INTRAMUSCULAR | Status: AC
  Administered 2013-09-19: 1 mg via INTRAVENOUS
  Filled 2013-09-19: qty 1

## 2013-09-19 MED ORDER — OXYCODONE-ACETAMINOPHEN 5-325 MG PO TABS: 2.0000 | ORAL_TABLET | ORAL | Status: DC | PRN

## 2013-09-19 NOTE — Discharge Instructions (Signed)
Flank Pain °Flank pain refers to pain that is located on the side of the body between the upper abdomen and the back. The pain may occur over a short period of time (acute) or may be long-term or reoccurring (chronic). It may be mild or severe. Flank pain can be caused by many things. °CAUSES  °Some of the more common causes of flank pain include: °· Muscle strains.   °· Muscle spasms.   °· A disease of your spine (vertebral disk disease).   °· A lung infection (pneumonia).   °· Fluid around your lungs (pulmonary edema).   °· A kidney infection.   °· Kidney stones.   °· A very painful skin rash caused by the chickenpox virus (shingles).   °· Gallbladder disease.   °HOME CARE INSTRUCTIONS  °Home care will depend on the cause of your pain. In general, °· Rest as directed by your caregiver. °· Drink enough fluids to keep your urine clear or pale yellow. °· Only take over-the-counter or prescription medicines as directed by your caregiver. Some medicines may help relieve the pain. °· Tell your caregiver about any changes in your pain. °· Follow up with your caregiver as directed. °SEEK IMMEDIATE MEDICAL CARE IF:  °· Your pain is not controlled with medicine.   °· You have new or worsening symptoms. °· Your pain increases.   °· You have abdominal pain.   °· You have shortness of breath.   °· You have persistent nausea or vomiting.   °· You have swelling in your abdomen.   °· You feel faint or pass out.   °· You have blood in your urine. °· You have a fever or persistent symptoms for more than 2-3 days. °· You have a fever and your symptoms suddenly get worse. °MAKE SURE YOU:  °· Understand these instructions. °· Will watch your condition. °· Will get help right away if you are not doing well or get worse. °Document Released: 03/08/2005 Document Revised: 10/10/2011 Document Reviewed: 08/30/2011 °[employer]® Patient Information ©2015 [employer]. This information is not intended to replace advice given to you by your  health care provider. Make sure you discuss any questions you have with your health care provider. ° °
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
## 2013-09-19 NOTE — ED Notes (Signed)
Pt c/o right flank pain that started a few days ago, pain does radiate down right leg at times, pt also states that he feels like he is not urinating as much as he normally does.

## 2013-09-20 ENCOUNTER — Encounter (HOSPITAL_COMMUNITY): Payer: Self-pay | Admitting: Emergency Medicine

## 2013-09-20 ENCOUNTER — Emergency Department (HOSPITAL_COMMUNITY): Payer: Medicaid Other

## 2013-09-20 ENCOUNTER — Emergency Department (HOSPITAL_COMMUNITY)
Admission: EM | Admit: 2013-09-20 | Discharge: 2013-09-20 | Disposition: A | Discharge status: Home / Self Care | Payer: Medicaid Other | Attending: Emergency Medicine | Admitting: Emergency Medicine

## 2013-09-20 DIAGNOSIS — F411 Generalized anxiety disorder: Secondary | ICD-10-CM | POA: Diagnosis not present

## 2013-09-20 DIAGNOSIS — M5430 Sciatica, unspecified side: Secondary | ICD-10-CM | POA: Diagnosis not present

## 2013-09-20 DIAGNOSIS — R109 Unspecified abdominal pain: Secondary | ICD-10-CM | POA: Diagnosis not present

## 2013-09-20 DIAGNOSIS — F17200 Nicotine dependence, unspecified, uncomplicated: Secondary | ICD-10-CM | POA: Insufficient documentation

## 2013-09-20 DIAGNOSIS — J189 Pneumonia, unspecified organism: Secondary | ICD-10-CM

## 2013-09-20 DIAGNOSIS — F3289 Other specified depressive episodes: Secondary | ICD-10-CM | POA: Diagnosis not present

## 2013-09-20 DIAGNOSIS — M545 Low back pain: Secondary | ICD-10-CM

## 2013-09-20 DIAGNOSIS — R197 Diarrhea, unspecified: Secondary | ICD-10-CM | POA: Diagnosis not present

## 2013-09-20 DIAGNOSIS — J159 Unspecified bacterial pneumonia: Secondary | ICD-10-CM | POA: Diagnosis not present

## 2013-09-20 DIAGNOSIS — Z791 Long term (current) use of non-steroidal anti-inflammatories (NSAID): Secondary | ICD-10-CM | POA: Insufficient documentation

## 2013-09-20 DIAGNOSIS — F4310 Post-traumatic stress disorder, unspecified: Secondary | ICD-10-CM | POA: Insufficient documentation

## 2013-09-20 DIAGNOSIS — I10 Essential (primary) hypertension: Secondary | ICD-10-CM | POA: Insufficient documentation

## 2013-09-20 DIAGNOSIS — R110 Nausea: Secondary | ICD-10-CM | POA: Insufficient documentation

## 2013-09-20 DIAGNOSIS — F329 Major depressive disorder, single episode, unspecified: Secondary | ICD-10-CM | POA: Insufficient documentation

## 2013-09-20 DIAGNOSIS — Z79899 Other long term (current) drug therapy: Secondary | ICD-10-CM | POA: Insufficient documentation

## 2013-09-20 MED ORDER — NAPROXEN 500 MG PO TABS: 500.0000 mg | ORAL_TABLET | Freq: Two times a day (BID) | ORAL | Status: DC

## 2013-09-20 MED ORDER — ALBUTEROL SULFATE (2.5 MG/3ML) 0.083% IN NEBU: 5.0000 mg | INHALATION_SOLUTION | Freq: Once | RESPIRATORY_TRACT | Status: DC

## 2013-09-20 MED ORDER — KETOROLAC TROMETHAMINE 30 MG/ML IJ SOLN
30.0000 mg | Freq: Once | INTRAMUSCULAR | Status: AC
  Administered 2013-09-20: 30 mg via INTRAVENOUS
  Filled 2013-09-20: qty 1

## 2013-09-20 MED ORDER — ALBUTEROL SULFATE HFA 108 (90 BASE) MCG/ACT IN AERS: 2.0000 | INHALATION_SPRAY | RESPIRATORY_TRACT | Status: AC | PRN

## 2013-09-20 MED ORDER — IPRATROPIUM-ALBUTEROL 0.5-2.5 (3) MG/3ML IN SOLN
3.0000 mL | Freq: Once | RESPIRATORY_TRACT | Status: AC
  Administered 2013-09-20: 3 mL via RESPIRATORY_TRACT
  Filled 2013-09-20: qty 3

## 2013-09-20 MED ORDER — IPRATROPIUM BROMIDE 0.02 % IN SOLN
0.5000 mg | RESPIRATORY_TRACT | Status: DC
Start: 2013-09-20 — End: 2013-09-20

## 2013-09-20 MED ORDER — OXYCODONE-ACETAMINOPHEN 5-325 MG PO TABS: 1.0000 | ORAL_TABLET | ORAL | Status: DC | PRN

## 2013-09-20 MED ORDER — LEVOFLOXACIN IN D5W 750 MG/150ML IV SOLN
750.0000 mg | Freq: Once | INTRAVENOUS | Status: AC
  Administered 2013-09-20: 750 mg via INTRAVENOUS
  Filled 2013-09-20: qty 150

## 2013-09-20 MED ORDER — LEVOFLOXACIN 750 MG PO TABS: 750.0000 mg | ORAL_TABLET | Freq: Every day | ORAL | Status: DC

## 2013-09-20 MED ORDER — FENTANYL CITRATE 0.05 MG/ML IJ SOLN
100.0000 ug | Freq: Once | INTRAMUSCULAR | Status: AC
Start: 2013-09-20 — End: 2013-09-20
  Administered 2013-09-20: 100 ug via INTRAVENOUS
  Filled 2013-09-20: qty 2

## 2013-09-20 MED ORDER — ALBUTEROL SULFATE (2.5 MG/3ML) 0.083% IN NEBU
2.5000 mg | INHALATION_SOLUTION | Freq: Once | RESPIRATORY_TRACT | Status: AC
  Administered 2013-09-20: 2.5 mg via RESPIRATORY_TRACT
  Filled 2013-09-20: qty 3

## 2013-09-20 NOTE — ED Notes (Addendum)
Pt c/o right flank pain that started a few days ago, was seen in er yesterday, states that he started having diarrhea, nausea and sob during the night and today, pt states that the percocet's that he was given yesterday has not helped any with the pain,
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
## 2013-09-20 NOTE — Discharge Instructions (Signed)
Please call your doctor for a followup appointment within 24-48 hours. When you talk to your doctor please let them know that you were seen in the emergency department and have them acquire all of your records so that they can discuss the findings with you and formulate a treatment plan to fully care for your new and ongoing problems.  Use the albuterol inhaler 2 puffs every 4 hours as needed for shortness of breath / wheezing / coughing  Levaquin once daily for 7 days  Naprosyn daily for pain  Percocet for severe pain  Drink plenty of fluids to stay hydrated

## 2013-09-28 ENCOUNTER — Encounter (HOSPITAL_COMMUNITY): Payer: Self-pay | Admitting: Emergency Medicine

## 2013-09-28 ENCOUNTER — Inpatient Hospital Stay (HOSPITAL_COMMUNITY)
Admission: EM | Admit: 2013-09-28 | Discharge: 2013-11-08 | DRG: 166 | Disposition: A | Discharge status: Skilled Nursing Facility | Payer: Medicaid Other | Attending: Internal Medicine | Admitting: Internal Medicine

## 2013-09-28 ENCOUNTER — Emergency Department (HOSPITAL_COMMUNITY): Payer: Medicaid Other

## 2013-09-28 DIAGNOSIS — K5900 Constipation, unspecified: Secondary | ICD-10-CM | POA: Diagnosis present

## 2013-09-28 DIAGNOSIS — J9601 Acute respiratory failure with hypoxia: Secondary | ICD-10-CM | POA: Diagnosis present

## 2013-09-28 DIAGNOSIS — Z79899 Other long term (current) drug therapy: Secondary | ICD-10-CM

## 2013-09-28 DIAGNOSIS — Z9689 Presence of other specified functional implants: Secondary | ICD-10-CM

## 2013-09-28 DIAGNOSIS — M5430 Sciatica, unspecified side: Secondary | ICD-10-CM | POA: Diagnosis present

## 2013-09-28 DIAGNOSIS — Z9119 Patient's noncompliance with other medical treatment and regimen: Secondary | ICD-10-CM | POA: Diagnosis present

## 2013-09-28 DIAGNOSIS — J939 Pneumothorax, unspecified: Secondary | ICD-10-CM | POA: Diagnosis present

## 2013-09-28 DIAGNOSIS — K222 Esophageal obstruction: Secondary | ICD-10-CM | POA: Diagnosis present

## 2013-09-28 DIAGNOSIS — J849 Interstitial pulmonary disease, unspecified: Secondary | ICD-10-CM

## 2013-09-28 DIAGNOSIS — G4733 Obstructive sleep apnea (adult) (pediatric): Secondary | ICD-10-CM

## 2013-09-28 DIAGNOSIS — Z4682 Encounter for fitting and adjustment of non-vascular catheter: Secondary | ICD-10-CM

## 2013-09-28 DIAGNOSIS — G8929 Other chronic pain: Secondary | ICD-10-CM | POA: Diagnosis present

## 2013-09-28 DIAGNOSIS — S27309A Unspecified injury of lung, unspecified, initial encounter: Secondary | ICD-10-CM

## 2013-09-28 DIAGNOSIS — F1290 Cannabis use, unspecified, uncomplicated: Secondary | ICD-10-CM | POA: Diagnosis present

## 2013-09-28 DIAGNOSIS — Z9989 Dependence on other enabling machines and devices: Secondary | ICD-10-CM

## 2013-09-28 DIAGNOSIS — Y95 Nosocomial condition: Secondary | ICD-10-CM | POA: Diagnosis present

## 2013-09-28 DIAGNOSIS — Z8659 Personal history of other mental and behavioral disorders: Secondary | ICD-10-CM

## 2013-09-28 DIAGNOSIS — T380X5A Adverse effect of glucocorticoids and synthetic analogues, initial encounter: Secondary | ICD-10-CM | POA: Diagnosis not present

## 2013-09-28 DIAGNOSIS — K219 Gastro-esophageal reflux disease without esophagitis: Secondary | ICD-10-CM | POA: Diagnosis present

## 2013-09-28 DIAGNOSIS — E872 Acidosis: Secondary | ICD-10-CM | POA: Diagnosis present

## 2013-09-28 DIAGNOSIS — J9600 Acute respiratory failure, unspecified whether with hypoxia or hypercapnia: Secondary | ICD-10-CM

## 2013-09-28 DIAGNOSIS — F17210 Nicotine dependence, cigarettes, uncomplicated: Secondary | ICD-10-CM | POA: Diagnosis present

## 2013-09-28 DIAGNOSIS — R739 Hyperglycemia, unspecified: Secondary | ICD-10-CM | POA: Diagnosis not present

## 2013-09-28 DIAGNOSIS — J80 Acute respiratory distress syndrome: Secondary | ICD-10-CM

## 2013-09-28 DIAGNOSIS — J8410 Pulmonary fibrosis, unspecified: Secondary | ICD-10-CM | POA: Diagnosis present

## 2013-09-28 DIAGNOSIS — J189 Pneumonia, unspecified organism: Secondary | ICD-10-CM | POA: Diagnosis present

## 2013-09-28 DIAGNOSIS — S27309D Unspecified injury of lung, unspecified, subsequent encounter: Secondary | ICD-10-CM

## 2013-09-28 DIAGNOSIS — J8409 Other alveolar and parieto-alveolar conditions: Secondary | ICD-10-CM

## 2013-09-28 DIAGNOSIS — G4700 Insomnia, unspecified: Secondary | ICD-10-CM | POA: Diagnosis present

## 2013-09-28 DIAGNOSIS — R1310 Dysphagia, unspecified: Secondary | ICD-10-CM | POA: Diagnosis present

## 2013-09-28 DIAGNOSIS — G894 Chronic pain syndrome: Secondary | ICD-10-CM

## 2013-09-28 DIAGNOSIS — D72829 Elevated white blood cell count, unspecified: Secondary | ICD-10-CM | POA: Diagnosis not present

## 2013-09-28 DIAGNOSIS — F4310 Post-traumatic stress disorder, unspecified: Secondary | ICD-10-CM | POA: Diagnosis present

## 2013-09-28 DIAGNOSIS — Z77090 Contact with and (suspected) exposure to asbestos: Secondary | ICD-10-CM | POA: Diagnosis present

## 2013-09-28 DIAGNOSIS — N179 Acute kidney failure, unspecified: Secondary | ICD-10-CM | POA: Diagnosis present

## 2013-09-28 DIAGNOSIS — F418 Other specified anxiety disorders: Secondary | ICD-10-CM | POA: Diagnosis present

## 2013-09-28 DIAGNOSIS — I10 Essential (primary) hypertension: Secondary | ICD-10-CM | POA: Diagnosis present

## 2013-09-28 DIAGNOSIS — Z96651 Presence of right artificial knee joint: Secondary | ICD-10-CM | POA: Diagnosis present

## 2013-09-28 LAB — CBC WITH DIFFERENTIAL/PLATELET
Basophils Absolute: 0.1 10*3/uL (ref 0.0–0.1)
Basophils Relative: 1 % (ref 0–1)
Eosinophils Absolute: 0.6 10*3/uL (ref 0.0–0.7)
Eosinophils Relative: 4 % (ref 0–5)
HCT: 38.3 % — ABNORMAL LOW (ref 39.0–52.0)
Hemoglobin: 13.4 g/dL (ref 13.0–17.0)
Lymphocytes Relative: 21 % (ref 12–46)
Lymphs Abs: 3.2 10*3/uL (ref 0.7–4.0)
MCH: 33.4 pg (ref 26.0–34.0)
MCHC: 35 g/dL (ref 30.0–36.0)
MCV: 95.5 fL (ref 78.0–100.0)
Monocytes Absolute: 0.9 10*3/uL (ref 0.1–1.0)
Monocytes Relative: 6 % (ref 3–12)
Neutro Abs: 10.5 10*3/uL — ABNORMAL HIGH (ref 1.7–7.7)
Neutrophils Relative %: 68 % (ref 43–77)
Platelets: 425 10*3/uL — ABNORMAL HIGH (ref 150–400)
RBC: 4.01 MIL/uL — ABNORMAL LOW (ref 4.22–5.81)
RDW: 13.1 % (ref 11.5–15.5)
WBC: 15.3 10*3/uL — ABNORMAL HIGH (ref 4.0–10.5)

## 2013-09-28 LAB — COMPREHENSIVE METABOLIC PANEL
ALT: 61 U/L — ABNORMAL HIGH (ref 0–53)
AST: 35 U/L (ref 0–37)
Albumin: 3 g/dL — ABNORMAL LOW (ref 3.5–5.2)
Alkaline Phosphatase: 91 U/L (ref 39–117)
Anion gap: 15 (ref 5–15)
BUN: 41 mg/dL — ABNORMAL HIGH (ref 6–23)
CO2: 24 mEq/L (ref 19–32)
Calcium: 9.1 mg/dL (ref 8.4–10.5)
Chloride: 99 mEq/L (ref 96–112)
Creatinine, Ser: 1.54 mg/dL — ABNORMAL HIGH (ref 0.50–1.35)
GFR calc Af Amer: 53 mL/min — ABNORMAL LOW (ref >90)
GFR calc non Af Amer: 46 mL/min — ABNORMAL LOW (ref >90)
Glucose, Bld: 123 mg/dL — ABNORMAL HIGH (ref 70–99)
Potassium: 4.8 mEq/L (ref 3.7–5.3)
Sodium: 138 mEq/L (ref 137–147)
Total Bilirubin: 0.3 mg/dL (ref 0.3–1.2)
Total Protein: 6.9 g/dL (ref 6.0–8.3)

## 2013-09-28 LAB — PRO B NATRIURETIC PEPTIDE: Pro B Natriuretic peptide (BNP): 70.1 pg/mL (ref 0–125)

## 2013-09-28 LAB — TROPONIN I: Troponin I: <0.3 ng/mL (ref <0.30)

## 2013-09-28 LAB — I-STAT CG4 LACTIC ACID, ED: Lactic Acid, Venous: 2.09 mmol/L (ref 0.5–2.2)

## 2013-09-28 MED ORDER — DEXTROSE 5 % IV SOLN
1.0000 g | Freq: Once | INTRAVENOUS | Status: AC
  Administered 2013-09-29: 1 g via INTRAVENOUS
  Filled 2013-09-28: qty 10

## 2013-09-28 MED ORDER — IPRATROPIUM-ALBUTEROL 0.5-2.5 (3) MG/3ML IN SOLN
3.0000 mL | Freq: Once | RESPIRATORY_TRACT | Status: AC
  Administered 2013-09-28: 3 mL via RESPIRATORY_TRACT
  Filled 2013-09-28: qty 3

## 2013-09-28 MED ORDER — DEXTROSE 5 % IV SOLN
500.0000 mg | Freq: Once | INTRAVENOUS | Status: AC
  Administered 2013-09-29: 500 mg via INTRAVENOUS
  Filled 2013-09-28: qty 500

## 2013-09-28 MED ORDER — KETOROLAC TROMETHAMINE 30 MG/ML IJ SOLN
30.0000 mg | Freq: Once | INTRAMUSCULAR | Status: AC
  Administered 2013-09-28: 30 mg via INTRAVENOUS
  Filled 2013-09-28: qty 1

## 2013-09-28 MED ORDER — MORPHINE SULFATE 4 MG/ML IJ SOLN
4.0000 mg | Freq: Once | INTRAMUSCULAR | Status: AC
  Administered 2013-09-28: 4 mg via INTRAVENOUS
  Filled 2013-09-28: qty 1

## 2013-09-28 NOTE — ED Notes (Signed)
Patient oxygen saturation on room air at 87-89%. Patient placed on 3 liters of oxygen via nasal canula, patient oxygen saturation up to 91%.

## 2013-09-28 NOTE — ED Notes (Signed)
Pt was treated last weekend for pneumonia. Pt completed the abx that were prescribed, reports he felt better yesterday and woke this AM with increased SOB, fever and chills. Pt has dyspnea at rest and dyspnea with exertion and unable to complete sentences.
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
## 2013-09-29 ENCOUNTER — Encounter (HOSPITAL_COMMUNITY): Payer: Self-pay | Admitting: Emergency Medicine

## 2013-09-29 DIAGNOSIS — M5430 Sciatica, unspecified side: Secondary | ICD-10-CM | POA: Diagnosis present

## 2013-09-29 DIAGNOSIS — J189 Pneumonia, unspecified organism: Secondary | ICD-10-CM | POA: Diagnosis present

## 2013-09-29 DIAGNOSIS — F418 Other specified anxiety disorders: Secondary | ICD-10-CM | POA: Diagnosis present

## 2013-09-29 DIAGNOSIS — R1310 Dysphagia, unspecified: Secondary | ICD-10-CM | POA: Diagnosis present

## 2013-09-29 DIAGNOSIS — J9600 Acute respiratory failure, unspecified whether with hypoxia or hypercapnia: Secondary | ICD-10-CM | POA: Diagnosis not present

## 2013-09-29 DIAGNOSIS — S27309A Unspecified injury of lung, unspecified, initial encounter: Secondary | ICD-10-CM | POA: Diagnosis not present

## 2013-09-29 DIAGNOSIS — D72829 Elevated white blood cell count, unspecified: Secondary | ICD-10-CM | POA: Diagnosis not present

## 2013-09-29 DIAGNOSIS — T380X5A Adverse effect of glucocorticoids and synthetic analogues, initial encounter: Secondary | ICD-10-CM | POA: Diagnosis not present

## 2013-09-29 DIAGNOSIS — I10 Essential (primary) hypertension: Secondary | ICD-10-CM | POA: Diagnosis present

## 2013-09-29 DIAGNOSIS — Z96651 Presence of right artificial knee joint: Secondary | ICD-10-CM | POA: Diagnosis present

## 2013-09-29 DIAGNOSIS — J984 Other disorders of lung: Secondary | ICD-10-CM | POA: Diagnosis not present

## 2013-09-29 DIAGNOSIS — Y95 Nosocomial condition: Secondary | ICD-10-CM | POA: Diagnosis present

## 2013-09-29 DIAGNOSIS — F4310 Post-traumatic stress disorder, unspecified: Secondary | ICD-10-CM | POA: Diagnosis not present

## 2013-09-29 DIAGNOSIS — J8410 Pulmonary fibrosis, unspecified: Secondary | ICD-10-CM | POA: Diagnosis not present

## 2013-09-29 DIAGNOSIS — Z79899 Other long term (current) drug therapy: Secondary | ICD-10-CM | POA: Diagnosis not present

## 2013-09-29 DIAGNOSIS — K5900 Constipation, unspecified: Secondary | ICD-10-CM | POA: Diagnosis present

## 2013-09-29 DIAGNOSIS — Z8659 Personal history of other mental and behavioral disorders: Secondary | ICD-10-CM | POA: Diagnosis not present

## 2013-09-29 DIAGNOSIS — G4733 Obstructive sleep apnea (adult) (pediatric): Secondary | ICD-10-CM | POA: Diagnosis present

## 2013-09-29 DIAGNOSIS — K219 Gastro-esophageal reflux disease without esophagitis: Secondary | ICD-10-CM | POA: Diagnosis present

## 2013-09-29 DIAGNOSIS — E872 Acidosis: Secondary | ICD-10-CM | POA: Diagnosis present

## 2013-09-29 DIAGNOSIS — N179 Acute kidney failure, unspecified: Secondary | ICD-10-CM | POA: Diagnosis present

## 2013-09-29 DIAGNOSIS — R739 Hyperglycemia, unspecified: Secondary | ICD-10-CM | POA: Diagnosis not present

## 2013-09-29 DIAGNOSIS — Z5189 Encounter for other specified aftercare: Secondary | ICD-10-CM | POA: Diagnosis not present

## 2013-09-29 DIAGNOSIS — Z9119 Patient's noncompliance with other medical treatment and regimen: Secondary | ICD-10-CM | POA: Diagnosis present

## 2013-09-29 DIAGNOSIS — G4700 Insomnia, unspecified: Secondary | ICD-10-CM | POA: Diagnosis present

## 2013-09-29 DIAGNOSIS — K222 Esophageal obstruction: Secondary | ICD-10-CM | POA: Diagnosis present

## 2013-09-29 DIAGNOSIS — J9601 Acute respiratory failure with hypoxia: Secondary | ICD-10-CM | POA: Diagnosis present

## 2013-09-29 DIAGNOSIS — G8929 Other chronic pain: Secondary | ICD-10-CM | POA: Diagnosis present

## 2013-09-29 DIAGNOSIS — Z77090 Contact with and (suspected) exposure to asbestos: Secondary | ICD-10-CM | POA: Diagnosis present

## 2013-09-29 DIAGNOSIS — G894 Chronic pain syndrome: Secondary | ICD-10-CM | POA: Diagnosis not present

## 2013-09-29 DIAGNOSIS — F1290 Cannabis use, unspecified, uncomplicated: Secondary | ICD-10-CM | POA: Diagnosis present

## 2013-09-29 DIAGNOSIS — J939 Pneumothorax, unspecified: Secondary | ICD-10-CM | POA: Diagnosis present

## 2013-09-29 DIAGNOSIS — F17210 Nicotine dependence, cigarettes, uncomplicated: Secondary | ICD-10-CM | POA: Diagnosis present

## 2013-09-29 LAB — CBC
HCT: 34.5 % — ABNORMAL LOW (ref 39.0–52.0)
Hemoglobin: 11.9 g/dL — ABNORMAL LOW (ref 13.0–17.0)
MCH: 33.2 pg (ref 26.0–34.0)
MCHC: 34.5 g/dL (ref 30.0–36.0)
MCV: 96.4 fL (ref 78.0–100.0)
Platelets: 385 10*3/uL (ref 150–400)
RBC: 3.58 MIL/uL — ABNORMAL LOW (ref 4.22–5.81)
RDW: 13.2 % (ref 11.5–15.5)
WBC: 13.5 10*3/uL — ABNORMAL HIGH (ref 4.0–10.5)

## 2013-09-29 LAB — COMPREHENSIVE METABOLIC PANEL
ALT: 48 U/L (ref 0–53)
AST: 25 U/L (ref 0–37)
Albumin: 2.6 g/dL — ABNORMAL LOW (ref 3.5–5.2)
Alkaline Phosphatase: 67 U/L (ref 39–117)
Anion gap: 18 — ABNORMAL HIGH (ref 5–15)
BUN: 43 mg/dL — ABNORMAL HIGH (ref 6–23)
CO2: 22 mEq/L (ref 19–32)
Calcium: 8.3 mg/dL — ABNORMAL LOW (ref 8.4–10.5)
Chloride: 98 mEq/L (ref 96–112)
Creatinine, Ser: 1.51 mg/dL — ABNORMAL HIGH (ref 0.50–1.35)
GFR calc Af Amer: 55 mL/min — ABNORMAL LOW (ref >90)
GFR calc non Af Amer: 47 mL/min — ABNORMAL LOW (ref >90)
Glucose, Bld: 141 mg/dL — ABNORMAL HIGH (ref 70–99)
Potassium: 3.8 mEq/L (ref 3.7–5.3)
Sodium: 138 mEq/L (ref 137–147)
Total Bilirubin: 0.3 mg/dL (ref 0.3–1.2)
Total Protein: 6.6 g/dL (ref 6.0–8.3)

## 2013-09-29 LAB — MRSA PCR SCREENING: MRSA by PCR: NEGATIVE

## 2013-09-29 MED ORDER — ZOLPIDEM TARTRATE 5 MG PO TABS
10.0000 mg | ORAL_TABLET | Freq: Every day | ORAL | Status: DC
  Administered 2013-09-29 – 2013-11-07 (×36): 10 mg via ORAL
  Filled 2013-09-29 (×37): qty 2

## 2013-09-29 MED ORDER — IPRATROPIUM-ALBUTEROL 0.5-2.5 (3) MG/3ML IN SOLN
3.0000 mL | Freq: Four times a day (QID) | RESPIRATORY_TRACT | Status: DC
  Administered 2013-09-29 – 2013-10-21 (×73): 3 mL via RESPIRATORY_TRACT
  Filled 2013-09-29 (×76): qty 3

## 2013-09-29 MED ORDER — DEXTROSE 5 % IV SOLN
500.0000 mg | INTRAVENOUS | Status: DC
  Administered 2013-09-29 – 2013-09-30 (×2): 500 mg via INTRAVENOUS
  Filled 2013-09-29 (×2): qty 500

## 2013-09-29 MED ORDER — ONDANSETRON HCL 4 MG/2ML IJ SOLN: 4.0000 mg | Freq: Four times a day (QID) | INTRAMUSCULAR | Status: DC | PRN

## 2013-09-29 MED ORDER — IPRATROPIUM BROMIDE 0.02 % IN SOLN: 0.5000 mg | Freq: Four times a day (QID) | RESPIRATORY_TRACT | Status: DC

## 2013-09-29 MED ORDER — OXYCODONE-ACETAMINOPHEN 5-325 MG PO TABS
1.0000 | ORAL_TABLET | ORAL | Status: DC | PRN
  Administered 2013-09-29 – 2013-10-01 (×9): 1 via ORAL
  Filled 2013-09-29 (×9): qty 1

## 2013-09-29 MED ORDER — DIAZEPAM 5 MG PO TABS
5.0000 mg | ORAL_TABLET | Freq: Four times a day (QID) | ORAL | Status: DC | PRN
  Administered 2013-09-29 – 2013-10-11 (×34): 5 mg via ORAL
  Filled 2013-09-29 (×36): qty 1

## 2013-09-29 MED ORDER — GUAIFENESIN ER 600 MG PO TB12
1200.0000 mg | ORAL_TABLET | Freq: Two times a day (BID) | ORAL | Status: DC
Start: 2013-09-29 — End: 2013-10-23
  Administered 2013-09-29 – 2013-10-21 (×45): 1200 mg via ORAL
  Filled 2013-09-29 (×52): qty 2

## 2013-09-29 MED ORDER — CHLORHEXIDINE GLUCONATE 0.12 % MT SOLN
15.0000 mL | Freq: Two times a day (BID) | OROMUCOSAL | Status: DC
  Administered 2013-09-29 – 2013-10-21 (×44): 15 mL via OROMUCOSAL
  Filled 2013-09-29 (×48): qty 15

## 2013-09-29 MED ORDER — QUETIAPINE FUMARATE 100 MG PO TABS
100.0000 mg | ORAL_TABLET | Freq: Every day | ORAL | Status: DC
  Administered 2013-09-29 – 2013-10-05 (×6): 100 mg via ORAL
  Filled 2013-09-29 (×13): qty 1

## 2013-09-29 MED ORDER — CETYLPYRIDINIUM CHLORIDE 0.05 % MT LIQD
7.0000 mL | Freq: Two times a day (BID) | OROMUCOSAL | Status: DC
  Administered 2013-09-30 – 2013-10-21 (×30): 7 mL via OROMUCOSAL

## 2013-09-29 MED ORDER — ONDANSETRON HCL 4 MG PO TABS: 4.0000 mg | ORAL_TABLET | Freq: Four times a day (QID) | ORAL | Status: DC | PRN

## 2013-09-29 MED ORDER — ALBUTEROL SULFATE (2.5 MG/3ML) 0.083% IN NEBU: 2.5000 mg | INHALATION_SOLUTION | Freq: Four times a day (QID) | RESPIRATORY_TRACT | Status: DC

## 2013-09-29 MED ORDER — ALBUTEROL SULFATE (2.5 MG/3ML) 0.083% IN NEBU
2.5000 mg | INHALATION_SOLUTION | RESPIRATORY_TRACT | Status: DC | PRN
  Administered 2013-09-29 – 2013-10-09 (×3): 2.5 mg via RESPIRATORY_TRACT
  Filled 2013-09-29 (×3): qty 3

## 2013-09-29 MED ORDER — PANTOPRAZOLE SODIUM 40 MG PO TBEC
40.0000 mg | DELAYED_RELEASE_TABLET | Freq: Two times a day (BID) | ORAL | Status: DC
  Administered 2013-09-29 – 2013-10-19 (×41): 40 mg via ORAL
  Filled 2013-09-29 (×41): qty 1

## 2013-09-29 MED ORDER — DEXTROSE 5 % IV SOLN
1.0000 g | INTRAVENOUS | Status: DC
  Administered 2013-09-29: 1 g via INTRAVENOUS
  Filled 2013-09-29: qty 10

## 2013-09-29 MED ORDER — HYDROMORPHONE HCL PF 1 MG/ML IJ SOLN
1.0000 mg | INTRAMUSCULAR | Status: DC | PRN
  Administered 2013-09-29 – 2013-10-06 (×28): 1 mg via INTRAVENOUS
  Filled 2013-09-29 (×28): qty 1

## 2013-09-29 MED ORDER — ESCITALOPRAM OXALATE 20 MG PO TABS
20.0000 mg | ORAL_TABLET | Freq: Every day | ORAL | Status: DC
  Administered 2013-09-29 – 2013-11-08 (×40): 20 mg via ORAL
  Filled 2013-09-29: qty 1
  Filled 2013-09-29 (×2): qty 2
  Filled 2013-09-29 (×2): qty 1
  Filled 2013-09-29: qty 2
  Filled 2013-09-29: qty 1
  Filled 2013-09-29 (×4): qty 2
  Filled 2013-09-29 (×3): qty 1
  Filled 2013-09-29 (×3): qty 2
  Filled 2013-09-29 (×4): qty 1
  Filled 2013-09-29: qty 2
  Filled 2013-09-29: qty 1
  Filled 2013-09-29: qty 2
  Filled 2013-09-29 (×3): qty 1
  Filled 2013-09-29: qty 2
  Filled 2013-09-29: qty 1
  Filled 2013-09-29 (×2): qty 2
  Filled 2013-09-29: qty 1
  Filled 2013-09-29 (×3): qty 2
  Filled 2013-09-29: qty 1
  Filled 2013-09-29 (×2): qty 2
  Filled 2013-09-29: qty 1
  Filled 2013-09-29: qty 2
  Filled 2013-09-29 (×2): qty 1

## 2013-09-29 MED ORDER — ENOXAPARIN SODIUM 40 MG/0.4ML ~~LOC~~ SOLN
40.0000 mg | SUBCUTANEOUS | Status: DC
  Administered 2013-09-29 – 2013-09-30 (×2): 40 mg via SUBCUTANEOUS
  Filled 2013-09-29 (×2): qty 0.4

## 2013-09-29 MED ORDER — SODIUM CHLORIDE 0.9 % IV SOLN
INTRAVENOUS | Status: DC
  Administered 2013-09-29 – 2013-09-30 (×2): via INTRAVENOUS

## 2013-09-29 MED ORDER — GABAPENTIN 300 MG PO CAPS
1200.0000 mg | ORAL_CAPSULE | Freq: Three times a day (TID) | ORAL | Status: DC
  Administered 2013-09-29 – 2013-10-01 (×9): 1200 mg via ORAL
  Administered 2013-10-02 (×2): 600 mg via ORAL
  Administered 2013-10-02: 1200 mg via ORAL
  Administered 2013-10-03: 600 mg via ORAL
  Filled 2013-09-29 (×13): qty 4

## 2013-09-29 MED ORDER — CLONAZEPAM 0.5 MG PO TABS
1.0000 mg | ORAL_TABLET | Freq: Every day | ORAL | Status: DC
  Administered 2013-09-29 – 2013-10-11 (×13): 1 mg via ORAL
  Filled 2013-09-29 (×13): qty 2

## 2013-09-29 NOTE — Progress Notes (Signed)
UR chart review completed.  
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
## 2013-09-29 NOTE — Progress Notes (Addendum)
Patient admitted to the hospital earlier this morning by Dr. Sharl Ma.  Patient seen and examined.    He has been admitted with progressive shortness of breath and chest xray indicates multifocal pneumonia.  He has been started on appropriate antibiotics and we will try to wean off oxygen as tolerated.  Continue pulmonary hygiene.  He also has some acute renal failure, likely related to dehydration and concomitant ACE/HCTZ use.  Will continue with IV fluids and monitor urine output. With patient's asbestos exposure and ground glass parenchymal attenuation at the lung bases, he may need CT chest with contrast for further evaluation.  Would hold off until his renal function is improved, but this can also be done as an outpatient.  He has been complaining of chronic, severe GERD to the point that he is having trouble taking his medications. Review of his PTA med list shows that he is on naprosyn BID and prn ibupofren, without any acid suppression therapy. I have advised him to discontinue NSAIDS and start on PPI. He is requesting outpatient GI referral and I feel that this would be reasonable.  [name],Max

## 2013-09-30 DIAGNOSIS — G4733 Obstructive sleep apnea (adult) (pediatric): Secondary | ICD-10-CM

## 2013-09-30 DIAGNOSIS — Z9989 Dependence on other enabling machines and devices: Secondary | ICD-10-CM

## 2013-09-30 LAB — BASIC METABOLIC PANEL
Anion gap: 12 (ref 5–15)
BUN: 18 mg/dL (ref 6–23)
CO2: 24 mEq/L (ref 19–32)
Calcium: 8.4 mg/dL (ref 8.4–10.5)
Chloride: 103 mEq/L (ref 96–112)
Creatinine, Ser: 1.07 mg/dL (ref 0.50–1.35)
GFR calc Af Amer: 83 mL/min — ABNORMAL LOW (ref >90)
GFR calc non Af Amer: 71 mL/min — ABNORMAL LOW (ref >90)
Glucose, Bld: 121 mg/dL — ABNORMAL HIGH (ref 70–99)
Potassium: 4.9 mEq/L (ref 3.7–5.3)
Sodium: 139 mEq/L (ref 137–147)

## 2013-09-30 LAB — CBC
HCT: 34.6 % — ABNORMAL LOW (ref 39.0–52.0)
Hemoglobin: 11.9 g/dL — ABNORMAL LOW (ref 13.0–17.0)
MCH: 33.4 pg (ref 26.0–34.0)
MCHC: 34.4 g/dL (ref 30.0–36.0)
MCV: 97.2 fL (ref 78.0–100.0)
Platelets: 366 10*3/uL (ref 150–400)
RBC: 3.56 MIL/uL — ABNORMAL LOW (ref 4.22–5.81)
RDW: 13.1 % (ref 11.5–15.5)
WBC: 14.1 10*3/uL — ABNORMAL HIGH (ref 4.0–10.5)

## 2013-09-30 LAB — STREP PNEUMONIAE URINARY ANTIGEN: Strep Pneumo Urinary Antigen: NEGATIVE

## 2013-09-30 MED ORDER — VANCOMYCIN HCL 10 G IV SOLR
1250.0000 mg | Freq: Two times a day (BID) | INTRAVENOUS | Status: DC
  Administered 2013-09-30 – 2013-10-03 (×6): 1250 mg via INTRAVENOUS
  Filled 2013-09-30 (×10): qty 1250

## 2013-09-30 MED ORDER — POLYETHYLENE GLYCOL 3350 17 G PO PACK
17.0000 g | PACK | Freq: Two times a day (BID) | ORAL | Status: DC
  Administered 2013-09-30 – 2013-10-04 (×9): 17 g via ORAL
  Filled 2013-09-30 (×10): qty 1

## 2013-09-30 MED ORDER — SENNA 8.6 MG PO TABS: 1.0000 | ORAL_TABLET | Freq: Every evening | ORAL | Status: DC | PRN

## 2013-09-30 MED ORDER — VANCOMYCIN HCL 10 G IV SOLR
2000.0000 mg | Freq: Once | INTRAVENOUS | Status: AC
  Administered 2013-09-30: 2000 mg via INTRAVENOUS
  Filled 2013-09-30: qty 2000

## 2013-09-30 MED ORDER — ENOXAPARIN SODIUM 60 MG/0.6ML ~~LOC~~ SOLN
60.0000 mg | SUBCUTANEOUS | Status: DC
Start: 2013-10-01 — End: 2013-10-16
  Administered 2013-10-01 – 2013-10-16 (×16): 60 mg via SUBCUTANEOUS
  Filled 2013-09-30 (×16): qty 0.6

## 2013-09-30 MED ORDER — DEXTROSE 5 % IV SOLN
1.0000 g | Freq: Three times a day (TID) | INTRAVENOUS | Status: DC
  Administered 2013-09-30 – 2013-10-02 (×7): 1 g via INTRAVENOUS
  Filled 2013-09-30 (×7): qty 1

## 2013-09-30 NOTE — Progress Notes (Signed)
ANTIBIOTIC CONSULT NOTE - INITIAL  Pharmacy Consult for Vancomycin Indication: pneumonia  Allergies  Allergen Reactions  . Flexeril [Cyclobenzaprine Hcl] Hives  . Other Rash    Raw tobacco   Patient Measurements: Height:  (180.3 cm) Weight: 249 lb 12.5 oz (113.3 kg) IBW/kg (Calculated) : 75.3  Vital Signs: Temp: 98.6 F (37 C) (09/02 0741) Temp src: Oral (09/02 0741) BP: 121/67 mmHg (09/02 0600) Pulse Rate: 113 (09/02 0600) Intake/Output from previous day: 09/01 0701 - 09/02 0700 In: 1518.3 [P.O.:240; I.V.:978.3; IV Piggyback:300] Out: 2350 [Urine:2350] Intake/Output from this shift: Total I/O In: 120 [P.O.:120] Out: -   Labs:  Recent Labs  09/28/13 2228 09/29/13 0436 09/30/13 0502  WBC 15.3* 13.5* 14.1*  HGB 13.4 11.9* 11.9*  PLT 425* 385 366  CREATININE 1.54* 1.51* 1.07   Estimated Creatinine Clearance: 89.3 ml/min (by C-G formula based on Cr of 1.07). No results found for this basename: VANCOTROUGH, Leodis Binet, VANCORANDOM, GENTTROUGH, GENTPEAK, GENTRANDOM, TOBRATROUGH, TOBRAPEAK, TOBRARND, AMIKACINPEAK, AMIKACINTROU, AMIKACIN,  in the last 72 hours   Microbiology: Recent Results (from the past 720 hour(s))  MRSA PCR SCREENING     Status: None   Collection Time    09/29/13  1:45 AM      Result Value Ref Range Status   MRSA by PCR NEGATIVE  NEGATIVE Final   Comment:            The GeneXpert MRSA Assay (FDA     approved for NASAL specimens     only), is one component of a     comprehensive MRSA colonization     surveillance program. It is not     intended to diagnose MRSA     infection nor to guide or     monitor treatment for     MRSA infections.  CULTURE, BLOOD (ROUTINE X 2)     Status: None   Collection Time    09/29/13  4:36 AM      Result Value Ref Range Status   Specimen Description Blood   Final   Special Requests NONE   Final   Culture NO GROWTH <24 HRS   Final   Report Status PENDING   Incomplete  CULTURE, BLOOD (ROUTINE X 2)      Status: None   Collection Time    09/29/13  4:36 AM      Result Value Ref Range Status   Specimen Description Blood   Final   Special Requests NONE   Final   Culture NO GROWTH <24 HRS   Final   Report Status PENDING   Incomplete   Medical History: Past Medical History  Diagnosis Date  . Sciatica   . Bulging discs     neck  . Torn rotator cuff   . PTSD (post-traumatic stress disorder)   . Depression   . Anxiety   . Hypertension   . Sleep apnea     supposed to wear CPAP but doesn't  . Syncopal episodes     pt states "it happens after i try to get up after smoking a cigarette"   Anti-infectives   Start     Dose/Rate Route Frequency Ordered Stop   09/30/13 2200  vancomycin (VANCOCIN) 1,250 mg in sodium chloride 0.9 % 250 mL IVPB     1,250 mg 166.7 mL/hr over 90 Minutes Intravenous Every 12 hours 09/30/13 1020     09/30/13 1000  ceFEPIme (MAXIPIME) 1 g in dextrose 5 % 50 mL IVPB  1 g 100 mL/hr over 30 Minutes Intravenous Every 8 hours 09/30/13 0851 10/08/13 0959   09/30/13 1000  vancomycin (VANCOCIN) 2,000 mg in sodium chloride 0.9 % 500 mL IVPB     2,000 mg 250 mL/hr over 120 Minutes Intravenous  Once 09/30/13 0902     09/29/13 2200  azithromycin (ZITHROMAX) 500 mg in dextrose 5 % 250 mL IVPB     500 mg 250 mL/hr over 60 Minutes Intravenous Every 24 hours 09/29/13 0202     09/29/13 2100  cefTRIAXone (ROCEPHIN) 1 g in dextrose 5 % 50 mL IVPB  Status:  Discontinued     1 g 100 mL/hr over 30 Minutes Intravenous Every 24 hours 09/29/13 0202 09/30/13 0851   09/29/13 0000  azithromycin (ZITHROMAX) 500 mg in dextrose 5 % 250 mL IVPB     500 mg 250 mL/hr over 60 Minutes Intravenous  Once 09/28/13 2348 09/29/13 0212   09/29/13 0000  cefTRIAXone (ROCEPHIN) 1 g in dextrose 5 % 50 mL IVPB     1 g 100 mL/hr over 30 Minutes Intravenous  Once 09/28/13 2348 09/29/13 0100      Assessment: 63yo male with pneumonia.  Pt failed OP Levaquin recently.  SCr has improved since admission.   Antibiotic coverage being broadened per MD.    Goal of Therapy:  Vancomycin trough level 15-20 mcg/ml Eradicate infection.  Plan:   Vancomycin  IV x 1 dose initially then  Vancomycin  IV q12hrs  Continue Cefepime and Zithromax per MD  Deescalate antibiotics when improved / appropriate  Check Vancomycin trough level at steady state  Monitor labs, renal fxn, and cultures  Valrie Hart A 09/30/2013,10:24 AM

## 2013-09-30 NOTE — Care Management Note (Addendum)
    Page 1 of 2   11/03/2013     12:45:54 PM CARE MANAGEMENT NOTE 11/03/2013  Patient:  ARROW, TOMKO   Account Number:  [id number]  Date Initiated:  09/30/2013  Documentation initiated by:  Sharrie Rothman  Subjective/Objective Assessment:   Pt admitted from home with pneumonia. Pt lives with a friend and will return home at discharge. Pt is fairly independent with ADL's. Pt has a cane for home use.     Action/Plan:   Pt may need home O2 at discharge. Pt would like a new cpap as his old one was discarded. Will check to see if medicaid will pay for new cpap as old cpap was only 25-53 years old.   Anticipated DC Date:  10/03/2013   Anticipated DC Plan:  SKILLED NURSING FACILITY  In-house referral  Clinical Social Worker      DC Planning Services  CM consult      Choice offered to / List presented to:             Status of service:  Completed, signed off Medicare Important Message given?  NO (If response is "NO", the following Medicare IM given date fields will be blank) Date Medicare IM given:   Medicare IM given by:   Date Additional Medicare IM given:   Additional Medicare IM given by:    Discharge Disposition:    Per UR Regulation:  Reviewed for med. necessity/level of care/duration of stay  If discussed at Long Length of Stay Meetings, dates discussed:   10/15/2013  10/22/2013  10/29/2013  11/03/2013    Comments:  11/03/13 1031 Letha Cape RN, BSN [phone] NCM and CSW spoke with patient to see if he would be willing to go to SNF where he can receive 6 liters of oxygen, then go home when he has been weaned down, he stated yes.  CSW informed him that it would be [facility], patient lives close by this facility as well.  NCM spoke with Dr. Marchelle Gearing to see if iv steroids could be changed to po , he stated yes he will transiiton him today and see how he does and plan for SNF tomorrow.  NCM informed CSW of this information.  11/02/13 1000 Letha Cape RN, BSN [phone] NCM spoke with Dr. Marchelle Gearing and he was thinking about Ltac for patient but medicaid does not cover ltac, so then he wanted to look into CIR, CIR states due to desats patient is limiited to how much pt he can do , so not appropriate at this time.  10/19/13 1330 Arlyss Queen, RN BSN CM Pt still requiring high liter O2 flow to maintian O2 sats. Pt to transfer to [facility]. CM on receiving unit to follow for discharge planning needs.  10/15/13 1415 Arlyss Queen, RN BSN CM Pt on 45% VM. Will continue to follow for discharge planning needs.  09/30/13 1450 Arlyss Queen, RN BSN CM

## 2013-09-30 NOTE — Progress Notes (Signed)
PROGRESS NOTE  JONTHAN LEITE ZOX:[id number] DOB: [birth year] DOA: 09/28/2013 PCP: Dorrene German, MD  Summary: 63 year old man with history of obstructive sleep apnea, asbestos exposure  Assessment/Plan: 1. Acute hypoxic respiratory failure secondary to pneumonia. High oxygen requirement and, currently appears stable. History of asbestos exposure. 2. Healthcare associated pneumonia. Recently treated with Levaquin approximately one week ago. 3. Acute kidney injury. Resolved. Secondary to poor oral intake, Naprosyn. Also lisinopril, hydrochlorothiazide.  4. Dysphagia. History of esophageal stricture requiring dilatation. Followed by gastroenterology in [place] but requests f/u here in [place]. CT showed thickening of distal esophageal wall.  5. PTSD, depression, anxiety. Appear to be stable. 6. OSA, refuses CPAP 7. Constipation. 8. Tobacco dependence in remission   Continue supplemental oxygen, broaden antibiotic therapy given recent failure on Levaquin; treat as HCAP. Send sputum culture, check HIV screen.  Check CT chest 9/3 to followup on pneumonia, further define pulmonary anatomy.   Offer CPAP.  Bowel regimen  Code Status: full code DVT prophylaxis: Lovenox Family Communication: none prseent Disposition Plan: home  Brendia Sacks, MD  [facility]  Pager [phone] If 7PM-7AM, please contact night-coverage at [email or web address], password [facility] 09/30/2013, 7:52 AM  LOS: 2 days   Consultants:    Procedures:    Antibiotics:  Zithromax 9/1 >>  Ceftriaxone 9/1 >  HPI/Subjective: Still SOB but feeling a little better and breathing a little better. Eating without difficulty. Only 1 BM in last 10 days.  Objective: Filed Vitals:   09/30/13 0400 09/30/13 0500 09/30/13 0600 09/30/13 0741  BP: 106/72 103/64 121/67   Pulse: 97 109 113   Temp: 98.8 F (37.1 C)   98.6 F (37 C)  TempSrc: Oral   Oral  Resp: Height:      Weight:  113.3 kg (249 lb 12.5  oz)    SpO2: 90% 92% 91%     Intake/Output Summary (Last 24 hours) at 09/30/13 0752 Last data filed at 09/30/13 8295  Gross per 24 hour  Intake 1518.33 ml  Output   1900 ml  Net -381.67 ml     Filed Weights   09/29/13 0151 09/29/13 0500 09/30/13 0500  Weight: 113.2 kg (249 lb 9 oz) 113.2 kg (249 lb 9 oz) 113.3 kg (249 lb 12.5 oz)    Exam:     Afebrile, tachycardic, normotensive. Oxygen saturation 90% on 5 L. Gen. Appears calm, comfortable. Eating breakfast. Appears acutely ill but nontoxic.  Psych. Alert, speech fluent and clear.  Eyes. Pupils equal, round, reactive to light.  ENT. Lips and tongue appear unremarkable.  Neck. This appears grossly unremarkable  Cardiovascular. Tachycardic, regular rhythm, no murmur, rub or gallop. No lower extremity edema.  Respiratory. Clear to auscultation bilaterally. Fair air movement. No wheezes, rales or rhonchi. Moderate increased respiratory effort. Able to speak in short sentences.  Skin. Few macular/papular lesions on the legs resolving.  Musculoskeletal. Appears grossly unremarkable.  Neurologic. Grossly nonfocal.  Data Reviewed: I/O: Urine output 2350  Chemistry: Basic metabolic panel unremarkable, shows resolution of acute kidney injury.  Heme: WBC 14.1, no significant change. Hemoglobin stable 11.9. Normal platelet count.  ID: Blood cultures pending  Imaging: Chest x-ray on admission with multifocal pneumonia.  Scheduled Meds: . antiseptic oral rinse  7 mL Mouth Rinse q12n4p  . azithromycin  500 mg Intravenous Q24H  . cefTRIAXone (ROCEPHIN)  IV  1 g Intravenous Q24H  . chlorhexidine  15 mL Mouth Rinse BID  . clonazePAM  1 mg Oral Daily  .  enoxaparin (LOVENOX) injection  40 mg Subcutaneous Q24H  . escitalopram  20 mg Oral Daily  . gabapentin  1,200 mg Oral TID  . guaiFENesin  1,200 mg Oral BID  . ipratropium-albuterol  3 mL Nebulization Q6H WA  . pantoprazole  40 mg Oral BID  . QUEtiapine  100 mg Oral QHS    . zolpidem  10 mg Oral QHS   Continuous Infusions: . sodium chloride 100 mL/hr at 09/30/13 2440    Principal Problem:   Acute respiratory failure with hypoxia Active Problems:   HCAP (healthcare-associated pneumonia)   Dysphagia, unspecified(787.20)   Acute kidney injury   GERD (gastroesophageal reflux disease)   OSA on CPAP   Time spent 25 minutes

## 2013-10-01 ENCOUNTER — Inpatient Hospital Stay (HOSPITAL_COMMUNITY): Payer: Medicaid Other

## 2013-10-01 LAB — LEGIONELLA ANTIGEN, URINE: Legionella Antigen, Urine: NEGATIVE

## 2013-10-01 LAB — CBC
HCT: 34.6 % — ABNORMAL LOW (ref 39.0–52.0)
Hemoglobin: 12 g/dL — ABNORMAL LOW (ref 13.0–17.0)
MCH: 33.6 pg (ref 26.0–34.0)
MCHC: 34.7 g/dL (ref 30.0–36.0)
MCV: 96.9 fL (ref 78.0–100.0)
Platelets: 344 10*3/uL (ref 150–400)
RBC: 3.57 MIL/uL — ABNORMAL LOW (ref 4.22–5.81)
RDW: 13.2 % (ref 11.5–15.5)
WBC: 14.4 10*3/uL — ABNORMAL HIGH (ref 4.0–10.5)

## 2013-10-01 LAB — HIV ANTIBODY (ROUTINE TESTING W REFLEX): HIV 1&2 Ab, 4th Generation: NONREACTIVE

## 2013-10-01 MED ORDER — IOHEXOL 300 MG/ML  SOLN
80.0000 mL | Freq: Once | INTRAMUSCULAR | Status: AC | PRN
  Administered 2013-10-01: 80 mL via INTRAVENOUS

## 2013-10-01 MED ORDER — AZITHROMYCIN 500 MG IV SOLR
500.0000 mg | INTRAVENOUS | Status: DC
  Administered 2013-10-01 – 2013-10-06 (×5): 500 mg via INTRAVENOUS
  Filled 2013-10-01 (×5): qty 500

## 2013-10-01 MED ORDER — OXYCODONE-ACETAMINOPHEN 5-325 MG PO TABS
1.0000 | ORAL_TABLET | ORAL | Status: DC | PRN
  Administered 2013-10-01 – 2013-10-07 (×11): 2 via ORAL
  Filled 2013-10-01 (×11): qty 2

## 2013-10-01 NOTE — Progress Notes (Signed)
Patient taken off CPAP due to low 02 and placed back on nasal cannula by RN. Patient's saturations not coming above 86%-placed patient on 55% venturi mask-finally reading 90-91% RT will continue to monitor

## 2013-10-01 NOTE — Consult Note (Signed)
Consult requested by: Dr. Irene Limbo Consult requested for pneumonia/respiratory failure:  HPI: This is a 63 year old who does not have known lung disease except for sleep apnea. He had been in his usual state of pretty good health when he developed problems with shortness of breath cough and congestion. He had been treated with Levaquin but continued to have more trouble and eventually was admitted to the hospital on 09/29/2013 with what looks like multifocal pneumonia. He was started on typical medications for community-acquired pneumonia but because of his treatment with Levaquin this was converted to cefepime and vancomycin. He continues to be short of breath and hypoxic. He had CT abdomen done approximately a week ago that showed thickening of his distal esophagus suggestive of reflux but I don't really see anything on the lower lobes of his lung that were seen on the scan. He says he has an approximately five-year history of smoking approximately one third package of cigarettes daily. This is recent and he stopped smoking when he got sick. No known family history of lung disease. He does work in [employer] and has a history of exposure to asbestos but no known asbestos related disease  Past Medical History  Diagnosis Date  . Sciatica   . Bulging discs     neck  . Torn rotator cuff   . PTSD (post-traumatic stress disorder)   . Depression   . Anxiety   . Hypertension   . Sleep apnea     supposed to wear CPAP but doesn't  . Syncopal episodes     pt states "it happens after i try to get up after smoking a cigarette"     Family History  Problem Relation Age of Onset  . Hypertension Sister   . Diabetes Sister      History   Social History  . Marital Status: Divorced    Spouse Name: N/A    Number of Children: N/A  . Years of Education: N/A   Social History Main Topics  . Smoking status: Former Smoker -- 0.20 packs/day for 5 years    Types: Cigarettes  . Smokeless tobacco: Former  Neurosurgeon    Quit date: 09/20/2013     Comment: 1 pack per week   . Alcohol Use: 1.2 - 1.8 oz/week    2-3 Cans of beer per week  . Drug Use: Yes    Special: Marijuana     Comment: marijuana last used 07/26/13 per pt  . Sexual Activity: No   Other Topics Concern  . None   Social History Narrative  . None     ROS: He has sciatic nerve pain. He has pain in his rotator cuff. He says he had trouble with posttraumatic stress disorder which has been pretty well controlled recently. He has some pleuritic chest pain but no angina.    Objective: Vital signs in last 24 hours: Temp:  [98.5 F (36.9 C)-99.5 F (37.5 C)] 99.5 F (37.5 C) (09/03 0751) Pulse Rate:  [91-119] 102 (09/03 0500) Resp:  [20-46] 32 (09/03 0500) BP: (90-129)/(48-83) 122/72 mmHg (09/03 0500) SpO2:  [82 %-99 %] 99 % (09/03 0705) Weight:  [112.8 kg (248 lb 10.9 oz)] 112.8 kg (248 lb 10.9 oz) (09/03 0500) Weight change: -0.5 kg (-1 lb 1.6 oz) Last BM Date: 09/26/13  Intake/Output from previous day: 09/02 0701 - 09/03 0700 In: 1118.3 [P.O.:480; I.V.:538.3; IV Piggyback:100] Out: 825 [Urine:825]  PHYSICAL EXAM He is awake and alert. He appears somewhat dyspneic at rest. He is tachypnea.  His oxygen saturation on nasal cannula very between about 84% and 91%. His pupils react. His nose and throat are clear. His neck is supple. His chest shows that he has fine rales fairly diffusely and some rhonchi. His heart is regular without gallop and heart sounds are somewhat obscured his abdomen is soft obese without masses. He does not have any edema. Central nervous system exam is grossly intact  Lab Results: Basic Metabolic Panel:  Recent Labs  57/84/69 0436 09/30/13 0502  NA 138 139  K 3.8 4.9  CL 98 103  CO2 22 24  GLUCOSE 141* 121*  BUN 43* 18  CREATININE 1.51* 1.07  CALCIUM 8.3* 8.4   Liver Function Tests:  Recent Labs  09/28/13 2228 09/29/13 0436  AST 35 25  ALT 61* 48  ALKPHOS 91 67  BILITOT 0.3 0.3  PROT  6.9 6.6  ALBUMIN 3.0* 2.6*   No results found for this basename: LIPASE, AMYLASE,  in the last 72 hours No results found for this basename: AMMONIA,  in the last 72 hours CBC:  Recent Labs  09/28/13 2228  09/30/13 0502 10/01/13 0415  WBC 15.3*  < > 14.1* 14.4*  NEUTROABS 10.5*  --   --   --   HGB 13.4  < > 11.9* 12.0*  HCT 38.3*  < > 34.6* 34.6*  MCV 95.5  < > 97.2 96.9  PLT 425*  < > 366 344  < > = values in this interval not displayed. Cardiac Enzymes:  Recent Labs  09/28/13 2330  TROPONINI <0.30   BNP:  Recent Labs  09/28/13 2228  PROBNP 70.1   D-Dimer: No results found for this basename: DDIMER,  in the last 72 hours CBG: No results found for this basename: GLUCAP,  in the last 72 hours Hemoglobin A1C: No results found for this basename: HGBA1C,  in the last 72 hours Fasting Lipid Panel: No results found for this basename: CHOL, HDL, LDLCALC, TRIG, CHOLHDL, LDLDIRECT,  in the last 72 hours Thyroid Function Tests: No results found for this basename: TSH, T4TOTAL, FREET4, T3FREE, THYROIDAB,  in the last 72 hours Anemia Panel: No results found for this basename: VITAMINB12, FOLATE, FERRITIN, TIBC, IRON, RETICCTPCT,  in the last 72 hours Coagulation: No results found for this basename: LABPROT, INR,  in the last 72 hours Urine Drug Screen: Drugs of Abuse  No results found for this basename: labopia, cocainscrnur, labbenz, amphetmu, thcu, labbarb    Alcohol Level: No results found for this basename: ETH,  in the last 72 hours Urinalysis: No results found for this basename: COLORURINE, APPERANCEUR, LABSPEC, PHURINE, GLUCOSEU, HGBUR, BILIRUBINUR, KETONESUR, PROTEINUR, UROBILINOGEN, NITRITE, LEUKOCYTESUR,  in the last 72 hours Misc. Labs:   ABGS: No results found for this basename: PHART, PCO2, PO2ART, TCO2, HCO3,  in the last 72 hours   MICROBIOLOGY: Recent Results (from the past 240 hour(s))  MRSA PCR SCREENING     Status: None   Collection Time     09/29/13  1:45 AM      Result Value Ref Range Status   MRSA by PCR NEGATIVE  NEGATIVE Final   Comment:            The GeneXpert MRSA Assay (FDA     approved for NASAL specimens     only), is one component of a     comprehensive MRSA colonization     surveillance program. It is not     intended to diagnose MRSA  infection nor to guide or     monitor treatment for     MRSA infections.  CULTURE, BLOOD (ROUTINE X 2)     Status: None   Collection Time    09/29/13  4:36 AM      Result Value Ref Range Status   Specimen Description Blood   Final   Special Requests NONE   Final   Culture NO GROWTH <24 HRS   Final   Report Status PENDING   Incomplete  CULTURE, BLOOD (ROUTINE X 2)     Status: None   Collection Time    09/29/13  4:36 AM      Result Value Ref Range Status   Specimen Description Blood   Final   Special Requests NONE   Final   Culture NO GROWTH <24 HRS   Final   Report Status PENDING   Incomplete    Studies/Results: No results found.  Medications:  Prior to Admission:  Prescriptions prior to admission  Medication Sig Dispense Refill  . albuterol (PROVENTIL HFA;VENTOLIN HFA) 108 (90 BASE) MCG/ACT inhaler Inhale 2 puffs into the lungs every 4 (four) hours as needed for wheezing or shortness of breath.  1 Inhaler  3  . clonazePAM (KLONOPIN) 1 MG tablet Take 1 mg by mouth daily.      . diazepam (VALIUM) 5 MG tablet Take 1 tablet (5 mg total) by mouth every 6 (six) hours as needed for muscle spasms.  10 tablet  0  . escitalopram (LEXAPRO) 20 MG tablet Take 20 mg by mouth daily.        Marland Kitchen gabapentin (NEURONTIN) 600 MG tablet Take 1,200 mg by mouth 3 (three) times daily.      Marland Kitchen ibuprofen (ADVIL,MOTRIN) 800 MG tablet Take 800 mg by mouth 2 (two) times daily as needed for moderate pain.      Marland Kitchen lisinopril-hydrochlorothiazide (PRINZIDE,ZESTORETIC) 20-12.5 MG per tablet Take 1 tablet by mouth daily.      . naproxen (NAPROSYN) 500 MG tablet Take 1 tablet (500 mg total) by mouth 2  (two) times daily with a meal.  30 tablet  0  . QUEtiapine (SEROQUEL) 100 MG tablet Take 100 mg by mouth at bedtime.      Marland Kitchen zolpidem (AMBIEN) 10 MG tablet Take 10 mg by mouth at bedtime.      Marland Kitchen levofloxacin (LEVAQUIN) 750 MG tablet Take 1 tablet (750 mg total) by mouth daily.  7 tablet  0  . oxyCODONE-acetaminophen (PERCOCET) 5-325 MG per tablet Take 1 tablet by mouth every 4 (four) hours as needed.  20 tablet  0   Scheduled: . antiseptic oral rinse  7 mL Mouth Rinse q12n4p  . ceFEPime (MAXIPIME) IV  1 g Intravenous Q8H  . chlorhexidine  15 mL Mouth Rinse BID  . clonazePAM  1 mg Oral Daily  . enoxaparin (LOVENOX) injection  60 mg Subcutaneous Q24H  . escitalopram  20 mg Oral Daily  . gabapentin  1,200 mg Oral TID  . guaiFENesin  1,200 mg Oral BID  . ipratropium-albuterol  3 mL Nebulization Q6H WA  . pantoprazole  40 mg Oral BID  . polyethylene glycol  17 g Oral BID  . QUEtiapine  100 mg Oral QHS  . vancomycin  1,250 mg Intravenous Q12H  . zolpidem  10 mg Oral QHS   Continuous:  [id number], diazepam, HYDROmorphone (DILAUDID) injection, ondansetron (ZOFRAN) IV, ondansetron, oxyCODONE-acetaminophen, senna  Assesment: He has acute respiratory failure with hypoxia. He apparently does not have any  known lung disease although he does have a fairly short smoking history. He has pneumonia and has been treated as healthcare associated pneumonia which I think is appropriate. He has some trouble with his swallowing but no definite evidence of aspiration he has obstructive sleep apnea but does not use his CPAP regularly Principal Problem:   Acute respiratory failure with hypoxia Active Problems:   HCAP (healthcare-associated pneumonia)   Dysphagia, unspecified(787.20)   Acute kidney injury   GERD (gastroesophageal reflux disease)   OSA on CPAP    Plan: I agree with plans to get CT of the chest to get a little better idea exactly how much infiltrate he has and to see if he has any other  problems such as occlusion of an airway et Karie Soda. His current antibiotics are appropriate. I think he could tolerate BiPAP if need be..  continue with mask oxygen at this point. Once we see the results of the CT consideration of steroids    LOS: 3 days   Shatoya Roets L 10/01/2013, 8:45 AM

## 2013-10-01 NOTE — Plan of Care (Signed)
Problem: Phase I Progression Outcomes Goal: Pain controlled with appropriate interventions Outcome: Progressing Schedule medications helps with pain tolerates a constant pain of 2 on scale of 1-10

## 2013-10-01 NOTE — Progress Notes (Signed)
PROGRESS NOTE  Max [name] ZOX:[id number] DOB: 01-23-1951 DOA: 09/28/2013 PCP: Dorrene German, MD  Summary: 63 year old man with history of obstructive sleep apnea, asbestos exposure  Assessment/Plan: 1. Acute hypoxic respiratory failure secondary to pneumonia. High oxygen requirement but clinically stable. History of asbestos exposure. 2. Healthcare associated pneumonia. No significant change, remain significantly hypoxic and tachypneic. Recently treated with Levaquin approximately one week ago. 3. Acute kidney injury. Resolved. Secondary to poor oral intake, Naprosyn. Also lisinopril, hydrochlorothiazide.  4. Dysphagia. History of esophageal stricture requiring dilatation. Followed by gastroenterology in [place] but requests f/u here in [place]. CT showed thickening of distal esophageal wall. Plan as below. 5. PTSD, depression, anxiety. Stable. 6. OSA, CPAP 7. Tobacco dependence in remission   He remains acutely he will with pneumonia and high flow oxygen requirement, however nontoxic. No indication for intubation at this point. Continue empiric antibiotics, obtain sputum culture if possible.  Plan CT of the chest today to further define acute process and pulmonary anatomy. Pulmonary consultation appreciated.  Plan further evaluation of dysphagia with gastroenterology once his clinical condition has improved. This is a chronic issue but has been worsening over time.  Code Status: full code DVT prophylaxis: Lovenox Family Communication: none prseent Disposition Plan: home  Brendia Sacks, MD  [facility]  Pager [phone] If 7PM-7AM, please contact night-coverage at [email or web address], password [facility] 10/01/2013, 7:56 AM  LOS: 3 days   Consultants:    Procedures:    Antibiotics:  Zithromax 9/1 >> 9/2  Cefepime 9/2 >>   Vancomycin 9/2 >>   Ceftriaxone 9/1  HPI/Subjective: Hypoxic overnight with CPAP. Required high flow oxygen overnight.  He feels about  the same today perhaps his breathing is a bit better. He complains of ongoing dysphagia which has been present for several years and worsening.  Objective: Filed Vitals:   10/01/13 0400 10/01/13 0500 10/01/13 0705 10/01/13 0751  BP: 116/70 122/72    Pulse: 104 102    Temp: 98.9 F (37.2 C)   99.5 F (37.5 C)  TempSrc: Oral   Axillary  Resp: 31 32    Height:      Weight:  112.8 kg (248 lb 10.9 oz)    SpO2: 87% 87% 99%     Intake/Output Summary (Last 24 hours) at 10/01/13 0756 Last data filed at 09/30/13 1900  Gross per 24 hour  Intake 1118.33 ml  Output    825 ml  Net 293.33 ml     Filed Weights   09/29/13 0500 09/30/13 0500 10/01/13 0500  Weight: 113.2 kg (249 lb 9 oz) 113.3 kg (249 lb 12.5 oz) 112.8 kg (248 lb 10.9 oz)    Exam:     Afebrile, tachypneic, tachycardic, normotensive. Hypoxic.  Gen. Appears calm, comfortable. Appears acutely ill but nontoxic.  Psychiatric grossly normal mood and affect. Speech fluent and appropriate.  Cardiovascular tachycardic, regular rhythm. No murmur, rub or gallop, no lower extremity edema. Telemetry sinus tachycardia/sinus rhythm.  Respiratory generally clear to auscultation bilaterally. No frank wheezes, rales or rhonchi. Fair air movement. Tachypneic, dyspneic. Able to speak in very short sentences. Moderate increased respiratory effort.  Data Reviewed:  Adequate urine output.  No change in leukocytosis, 14.4. Hemoglobin stable 12.0.  Scheduled Meds: . antiseptic oral rinse  7 mL Mouth Rinse q12n4p  . azithromycin  500 mg Intravenous Q24H  . ceFEPime (MAXIPIME) IV  1 g Intravenous Q8H  . chlorhexidine  15 mL Mouth Rinse BID  . clonazePAM  1 mg Oral Daily  . enoxaparin (  LOVENOX) injection  60 mg Subcutaneous Q24H  . escitalopram  20 mg Oral Daily  . gabapentin  1,200 mg Oral TID  . guaiFENesin  1,200 mg Oral BID  . ipratropium-albuterol  3 mL Nebulization Q6H WA  . pantoprazole  40 mg Oral BID  . polyethylene glycol   17 g Oral BID  . QUEtiapine  100 mg Oral QHS  . vancomycin  1,250 mg Intravenous Q12H  . zolpidem  10 mg Oral QHS   Continuous Infusions:    Principal Problem:   Acute respiratory failure with hypoxia Active Problems:   HCAP (healthcare-associated pneumonia)   Dysphagia, unspecified(787.20)   Acute kidney injury   GERD (gastroesophageal reflux disease)   OSA on CPAP   Time spent 25 minutes

## 2013-10-02 ENCOUNTER — Encounter (HOSPITAL_COMMUNITY): Payer: Medicaid Other

## 2013-10-02 ENCOUNTER — Inpatient Hospital Stay (HOSPITAL_COMMUNITY): Payer: Medicaid Other

## 2013-10-02 DIAGNOSIS — G4733 Obstructive sleep apnea (adult) (pediatric): Secondary | ICD-10-CM

## 2013-10-02 LAB — BASIC METABOLIC PANEL
Anion gap: 12 (ref 5–15)
BUN: 17 mg/dL (ref 6–23)
CO2: 26 mEq/L (ref 19–32)
Calcium: 8.6 mg/dL (ref 8.4–10.5)
Chloride: 99 mEq/L (ref 96–112)
Creatinine, Ser: 1 mg/dL (ref 0.50–1.35)
GFR calc Af Amer: 90 mL/min — ABNORMAL LOW (ref >90)
GFR calc non Af Amer: 78 mL/min — ABNORMAL LOW (ref >90)
Glucose, Bld: 118 mg/dL — ABNORMAL HIGH (ref 70–99)
Potassium: 4.5 mEq/L (ref 3.7–5.3)
Sodium: 137 mEq/L (ref 137–147)

## 2013-10-02 LAB — CBC
HCT: 33.1 % — ABNORMAL LOW (ref 39.0–52.0)
Hemoglobin: 11.3 g/dL — ABNORMAL LOW (ref 13.0–17.0)
MCH: 33.1 pg (ref 26.0–34.0)
MCHC: 34.1 g/dL (ref 30.0–36.0)
MCV: 97.1 fL (ref 78.0–100.0)
Platelets: 324 10*3/uL (ref 150–400)
RBC: 3.41 MIL/uL — ABNORMAL LOW (ref 4.22–5.81)
RDW: 13 % (ref 11.5–15.5)
WBC: 14.7 10*3/uL — ABNORMAL HIGH (ref 4.0–10.5)

## 2013-10-02 MED ORDER — METHYLPREDNISOLONE SODIUM SUCC 125 MG IJ SOLR
60.0000 mg | Freq: Four times a day (QID) | INTRAMUSCULAR | Status: DC
  Administered 2013-10-02 – 2013-10-08 (×25): 60 mg via INTRAVENOUS
  Filled 2013-10-02 (×25): qty 2

## 2013-10-02 MED ORDER — SODIUM CHLORIDE 0.9 % IJ SOLN
10.0000 mL | INTRAMUSCULAR | Status: DC | PRN
  Administered 2013-10-12: 20 mL
  Administered 2013-10-12 – 2013-10-17 (×3): 10 mL
  Administered 2013-10-17 – 2013-10-30 (×2): 20 mL
  Administered 2013-10-31 – 2013-11-02 (×2): 10 mL
  Administered 2013-11-02: 20 mL
  Administered 2013-11-03: 10 mL
  Administered 2013-11-04: 20 mL
  Administered 2013-11-05 – 2013-11-07 (×6): 10 mL

## 2013-10-02 MED ORDER — SODIUM CHLORIDE 0.9 % IJ SOLN
10.0000 mL | Freq: Two times a day (BID) | INTRAMUSCULAR | Status: DC
  Administered 2013-10-02 – 2013-10-03 (×3): 10 mL
  Administered 2013-10-04: 20 mL
  Administered 2013-10-04: 40 mL
  Administered 2013-10-05 – 2013-10-09 (×6): 10 mL
  Administered 2013-10-09: 20 mL
  Administered 2013-10-10: 10 mL
  Administered 2013-10-10: 40 mL
  Administered 2013-10-11: 10 mL
  Administered 2013-10-11: 40 mL
  Administered 2013-10-13 (×2): 10 mL
  Administered 2013-10-14 (×2): 20 mL
  Administered 2013-10-15: 10 mL
  Administered 2013-10-15: 20 mL
  Administered 2013-10-16: 10 mL
  Administered 2013-10-16: 40 mL
  Administered 2013-10-17 – 2013-10-18 (×3): 10 mL
  Administered 2013-10-19 (×3): 20 mL
  Administered 2013-10-20 – 2013-10-22 (×5): 10 mL
  Administered 2013-10-23: 30 mL
  Administered 2013-10-23 – 2013-10-28 (×10): 10 mL
  Administered 2013-10-29: 20 mL
  Administered 2013-10-31: 3 mL
  Administered 2013-11-01 – 2013-11-02 (×3): 10 mL

## 2013-10-02 MED ORDER — MEROPENEM 1 G IV SOLR
1.0000 g | Freq: Three times a day (TID) | INTRAVENOUS | Status: DC
  Administered 2013-10-02 – 2013-10-09 (×22): 1 g via INTRAVENOUS
  Filled 2013-10-02 (×31): qty 1

## 2013-10-02 NOTE — Progress Notes (Signed)
PROGRESS NOTE  Max [name] RUE:[id number] DOB: [birth year] DOA: 09/28/2013 PCP: Dorrene German, MD  Summary: 63 year old man with history of obstructive sleep apnea, asbestos exposure  Assessment/Plan: 1. Acute hypoxic respiratory failure secondary to pneumonia. No significant improvement as far. Pulmonology following. 2. Healthcare associated pneumonia. No significant change since yesterday. 3. Acute kidney injury. Resolved. Secondary to poor oral intake, Naprosyn. Also lisinopril, hydrochlorothiazide.  4. Dysphagia. History of esophageal stricture requiring dilatation. Followed by gastroenterology in [place] but requests f/u here in [place]. CT showed thickening of distal esophageal wall. Plan as below. 5. PTSD, depression, anxiety. Stable. 6. OSA, CPAP 7. Tobacco dependence in remission   Slightly worse today, requiring high flow oxygen but remains hemodynamically stable. No acute indication for intubation or BiPAP at this point. Discussed with pulmonology. Although differential is broad, pneumonia is favored. Plan to continue antibiotics. Steroids have been added for the possibility of an inflammatory process.  I have discussed chart issues with the patient, including the possibilities of BiPAP or intubation if worsens, he understands.  Plan further evaluation of dysphagia with gastroenterology once his clinical condition has improved. This is a chronic issue but has been worsening over time. He said no vomiting and clinical history and imaging do not suggest aspiration.  Code Status: full code DVT prophylaxis: Lovenox Family Communication: none prseent Disposition Plan: home  Brendia Sacks, MD  [facility]  Pager [phone] If 7PM-7AM, please contact night-coverage at [email or web address], password [facility] 10/02/2013, 7:48 AM  LOS: 4 days   Consultants:  Pulmonology  Procedures:    Antibiotics:  Zithromax 9/1 >>   Vancomycin 9/2 >>  Merrem 9/4 >>  Cefepime  9/2 >> 9/4  Ceftriaxone 9/1  HPI/Subjective: Requires facemask oxygenation, desats with any exertion. CT yesterday revealed diffuse bilateral pulmonary infiltrates.  He feels okay today but still remained short of breath. Breathing is about the same as yesterday. He has some right-sided sciatica but no other complaints except chronic dysphagia. He has been able to eat.  Objective: Filed Vitals:   10/02/13 0400 10/02/13 0500 10/02/13 0600 10/02/13 0700  BP: 132/68 121/73 113/72 121/68  Pulse: 104 96 88 92  Temp:      TempSrc:      Resp: 33 26 25 35  Height:      Weight:      SpO2: 88% 94% 98% 94%    Intake/Output Summary (Last 24 hours) at 10/02/13 0748 Last data filed at 10/02/13 0300  Gross per 24 hour  Intake   2060 ml  Output    500 ml  Net   1560 ml     Filed Weights   09/29/13 0500 09/30/13 0500 10/01/13 0500  Weight: 113.2 kg (249 lb 9 oz) 113.3 kg (249 lb 12.5 oz) 112.8 kg (248 lb 10.9 oz)    Exam:     He remains afebrile, hemodynamically stable. Oxygenation mid 90s on nonrebreather. Tachypneic.  Gen/Psych. He appears acutely ill, is able to speak in short sentences. Alert, appears comfortable. Speech is fluent and clear.  Cardiovascular tachycardic, regular rhythm. No murmur, rub or gallop. No lower extremity edema. Telemetry sinus tachycardia.  Respiratory clear to auscultation bilaterally. No wheezes, rales or rhonchi. Good air movement. Moderate increased respiratory effort.  Abdomen soft.  Data Reviewed:  Excellent urine output. I/O balanced.  Basic metabolic panel unremarkable, normal anion gap.  Note change in leukocytosis, 14.7. Hemoglobin stable at 11.3.  HIV nonreactive. Legionella and Strep pneumoniae urinary antigen is negative.  CT chest with extensive  bilateral airspace disease with broad differential.  Scheduled Meds: . antiseptic oral rinse  7 mL Mouth Rinse q12n4p  . azithromycin  500 mg Intravenous Q24H  . ceFEPime (MAXIPIME) IV   1 g Intravenous Q8H  . chlorhexidine  15 mL Mouth Rinse BID  . clonazePAM  1 mg Oral Daily  . enoxaparin (LOVENOX) injection  60 mg Subcutaneous Q24H  . escitalopram  20 mg Oral Daily  . gabapentin  1,200 mg Oral TID  . guaiFENesin  1,200 mg Oral BID  . ipratropium-albuterol  3 mL Nebulization Q6H WA  . pantoprazole  40 mg Oral BID  . polyethylene glycol  17 g Oral BID  . QUEtiapine  100 mg Oral QHS  . vancomycin  1,250 mg Intravenous Q12H  . zolpidem  10 mg Oral QHS   Continuous Infusions:    Principal Problem:   Acute respiratory failure with hypoxia Active Problems:   HCAP (healthcare-associated pneumonia)   Dysphagia, unspecified(787.20)   Acute kidney injury   GERD (gastroesophageal reflux disease)   OSA on CPAP   Time spent 30 minutes

## 2013-10-02 NOTE — Progress Notes (Signed)
Pt has no obvious or stated distress WITH THE EXCEPTION of : Pt desaturates with any level of exertion. 15 LPM NRBM has alleviated this some for the pt and he does not desat as fast, although he does state noticeable difficulty with exertion.  Pt does have random bouts of sweating at night... Physicians are aware of all of the above. PT is able to rest at night.  No further.

## 2013-10-02 NOTE — Progress Notes (Addendum)
ANTIBIOTIC CONSULT NOTE  Pharmacy Consult for Vancomycin, Meropenem Indication: pneumonia  Allergies  Allergen Reactions  . Flexeril [Cyclobenzaprine Hcl] Hives  . Other Rash    Raw tobacco   Patient Measurements: Height:  (180.3 cm) Weight: 248 lb 10.9 oz (112.8 kg) IBW/kg (Calculated) : 75.3  Vital Signs: BP: 121/68 mmHg (09/04 0700) Pulse Rate: 92 (09/04 0700) Intake/Output from previous day: 09/03 0701 - 09/04 0700 In: 2060 [P.O.:1160; IV Piggyback:900] Out: 1950 [Urine:1950] Intake/Output from this shift:    Labs:  Recent Labs  09/30/13 0502 10/01/13 0415 10/02/13 0422  WBC 14.1* 14.4* 14.7*  HGB 11.9* 12.0* 11.3*  PLT 366 344 324  CREATININE 1.07  --  1.00   Estimated Creatinine Clearance: 95.3 ml/min (by C-G formula based on Cr of 1). No results found for this basename: VANCOTROUGH, Leodis Binet, VANCORANDOM, GENTTROUGH, GENTPEAK, GENTRANDOM, TOBRATROUGH, TOBRAPEAK, TOBRARND, AMIKACINPEAK, AMIKACINTROU, AMIKACIN,  in the last 72 hours   Microbiology: Recent Results (from the past 720 hour(s))  MRSA PCR SCREENING     Status: None   Collection Time    09/29/13  1:45 AM      Result Value Ref Range Status   MRSA by PCR NEGATIVE  NEGATIVE Final   Comment:            The GeneXpert MRSA Assay (FDA     approved for NASAL specimens     only), is one component of a     comprehensive MRSA colonization     surveillance program. It is not     intended to diagnose MRSA     infection nor to guide or     monitor treatment for     MRSA infections.  CULTURE, BLOOD (ROUTINE X 2)     Status: None   Collection Time    09/29/13  4:36 AM      Result Value Ref Range Status   Specimen Description BLOOD LEFT ANTECUBITAL   Final   Special Requests BOTTLES DRAWN AEROBIC AND ANAEROBIC 10CC   Final   Culture NO GROWTH 2 DAYS   Final   Report Status PENDING   Incomplete  CULTURE, BLOOD (ROUTINE X 2)     Status: None   Collection Time    09/29/13  4:36 AM      Result  Value Ref Range Status   Specimen Description BLOOD LEFT HAND   Final   Special Requests BOTTLES DRAWN AEROBIC AND ANAEROBIC 8CC   Final   Culture NO GROWTH 2 DAYS   Final   Report Status PENDING   Incomplete   Medical History: Past Medical History  Diagnosis Date  . Sciatica   . Bulging discs     neck  . Torn rotator cuff   . PTSD (post-traumatic stress disorder)   . Depression   . Anxiety   . Hypertension   . Sleep apnea     supposed to wear CPAP but doesn't  . Syncopal episodes     pt states "it happens after i try to get up after smoking a cigarette"   Anti-infectives   Start     Dose/Rate Route Frequency Ordered Stop   10/02/13 1400  meropenem (MERREM) 1 g in sodium chloride 0.9 % 100 mL IVPB     1 g 200 mL/hr over 30 Minutes Intravenous 3 times per day 10/02/13 0918     10/01/13 2200  azithromycin (ZITHROMAX) 500 mg in dextrose 5 % 250 mL IVPB     500 mg  250 mL/hr over 60 Minutes Intravenous Every 24 hours 10/01/13 1153     09/30/13 2200  vancomycin (VANCOCIN) 1,250 mg in sodium chloride 0.9 % 250 mL IVPB     1,250 mg 166.7 mL/hr over 90 Minutes Intravenous Every 12 hours 09/30/13 1020     09/30/13 1000  ceFEPIme (MAXIPIME) 1 g in dextrose 5 % 50 mL IVPB  Status:  Discontinued     1 g 100 mL/hr over 30 Minutes Intravenous Every 8 hours 09/30/13 0851 10/02/13 0906   09/30/13 1000  vancomycin (VANCOCIN) 2,000 mg in sodium chloride 0.9 % 500 mL IVPB     2,000 mg 250 mL/hr over 120 Minutes Intravenous  Once 09/30/13 0902 09/30/13 1244   09/29/13 2200  azithromycin (ZITHROMAX) 500 mg in dextrose 5 % 250 mL IVPB  Status:  Discontinued     500 mg 250 mL/hr over 60 Minutes Intravenous Every 24 hours 09/29/13 0202 10/01/13 0848   09/29/13 2100  cefTRIAXone (ROCEPHIN) 1 g in dextrose 5 % 50 mL IVPB  Status:  Discontinued     1 g 100 mL/hr over 30 Minutes Intravenous Every 24 hours 09/29/13 0202 09/30/13 0851   09/29/13 0000  azithromycin (ZITHROMAX) 500 mg in dextrose 5 % 250  mL IVPB     500 mg 250 mL/hr over 60 Minutes Intravenous  Once 09/28/13 2348 09/29/13 0212   09/29/13 0000  cefTRIAXone (ROCEPHIN) 1 g in dextrose 5 % 50 mL IVPB     1 g 100 mL/hr over 30 Minutes Intravenous  Once 09/28/13 2348 09/29/13 0100      Assessment: 63yo male with acute hypoxic respiratory failure secondary to pneumonia.  Pt failed OP Levaquin.  No significant improvement in respiratory status since admission.  He is afebrile with persistent leukocytosis.  Cefepime is being changed to Meropenem for better anaerobic coverage.  SCr has improved since admission to patient's baseline.    Meropenem 9/4>> Vancomycin 9/2>> Cefepime 9/2>>9/4 Zithromax 9/1>> Rocephin 9/1>>9/2  Goal of Therapy:  Vancomycin trough level 15-20 mcg/ml Eradicate infection.  Plan:   Meropenem 1gm IV q8h  Continue Vancomycin  IV q12hrs  Continue Zithromax per MD for atypical coverage  Check Vancomycin trough level at steady state -ordered for 9/5 am  Monitor labs, renal fxn, and cultures  Deondria Puryear, [facility] 10/02/2013,9:20 AM

## 2013-10-02 NOTE — Progress Notes (Addendum)
Placed PT on BiPAP 8/6 f 12 100%. PT appears comfortable with no problems ( other than decreased oxygen SaO2 when taken off NRB mask) PT was on CPAP 5 last night. Trying BiPAP mainly to see if we can decrease Oxygen from 100%.

## 2013-10-02 NOTE — Progress Notes (Signed)
Subjective: He says he feels better. He has been able to cough up a little bit of sputum. He is still on a nonrebreather mask and still has desaturation of oxygen when he does any exertion. His CT showed diffuse infiltrates but nothing diagnostic  Objective: Vital signs in last 24 hours: Temp:  [97.5 F (36.4 C)-98.8 F (37.1 C)] 98.8 F (37.1 C) (09/03 2000) Pulse Rate:  [82-105] 92 (09/04 0700) Resp:  [19-36] 35 (09/04 0700) BP: (99-149)/(49-83) 121/68 mmHg (09/04 0700) SpO2:  [81 %-99 %] 94 % (09/04 0819) FiO2 (%):  [44 %-100 %] 100 % (09/04 0819) Weight change:  Last BM Date: 09/26/13  Intake/Output from previous day: 09/03 0701 - 09/04 0700 In: 2060 [P.O.:1160; IV Piggyback:900] Out: 1950 [Urine:1950]  PHYSICAL EXAM General appearance: alert, cooperative, moderate distress and moderately obese Resp: Somewhat diminished breath sounds tachypnea and wheezing Cardio: regular rate and rhythm, S1, S2 normal, no murmur, click, rub or gallop GI: soft, non-tender; bowel sounds normal; no masses,  no organomegaly Extremities: extremities normal, atraumatic, no cyanosis or edema  Lab Results:  Results for orders placed during the hospital encounter of 09/28/13 (from the past 48 hour(s))  HIV ANTIBODY (ROUTINE TESTING)     Status: None   Collection Time    10/01/13  4:15 AM      Result Value Ref Range   HIV 1&2 Ab, 4th Generation NONREACTIVE  NONREACTIVE   Comment: (NOTE)     A NONREACTIVE HIV Ag/Ab result does not exclude HIV infection since     the time frame for seroconversion is variable. If acute HIV infection     is suspected, a HIV-1 RNA Qualitative TMA test is recommended.     HIV-1/2 Antibody Diff         Not indicated.     HIV-1 RNA, Qual TMA           Not indicated.     PLEASE NOTE: This information has been disclosed to you from records     whose confidentiality may be protected by state law. If your state     requires such protection, then the state law prohibits  you from making     any further disclosure of the information without the specific written     consent of the person to whom it pertains, or as otherwise permitted     by law. A general authorization for the release of medical or other     information is NOT sufficient for this purpose.     The performance of this assay has not been clinically validated in     patients less than 7 years old.     Performed at [facility]  CBC     Status: Abnormal   Collection Time    10/01/13  4:15 AM      Result Value Ref Range   WBC 14.4 (*) 4.0 - 10.5 K/uL   RBC 3.57 (*) 4.22 - 5.81 MIL/uL   Hemoglobin 12.0 (*) 13.0 - 17.0 g/dL   HCT 34.6 (*) 39.0 - 52.0 %   MCV 96.9  78.0 - 100.0 fL   MCH 33.6  26.0 - 34.0 pg   MCHC 34.7  30.0 - 36.0 g/dL   RDW 13.2  11.5 - 15.5 %   Platelets 344  150 - 400 K/uL  BASIC METABOLIC PANEL     Status: Abnormal   Collection Time    10/02/13  4:22 AM  Result Value Ref Range   Sodium 137  137 - 147 mEq/L   Potassium 4.5  3.7 - 5.3 mEq/L   Chloride 99  96 - 112 mEq/L   CO2 26  19 - 32 mEq/L   Glucose, Bld 118 (*) 70 - 99 mg/dL   BUN 17  6 - 23 mg/dL   Creatinine, Ser 1.00  0.50 - 1.35 mg/dL   Calcium 8.6  8.4 - 10.5 mg/dL   GFR calc non Af Amer 78 (*) >90 mL/min   GFR calc Af Amer 90 (*) >90 mL/min   Comment: (NOTE)     The eGFR has been calculated using the CKD EPI equation.     This calculation has not been validated in all clinical situations.     eGFR's persistently <90 mL/min signify possible Chronic Kidney     Disease.   Anion gap 12  5 - 15  CBC     Status: Abnormal   Collection Time    10/02/13  4:22 AM      Result Value Ref Range   WBC 14.7 (*) 4.0 - 10.5 K/uL   RBC 3.41 (*) 4.22 - 5.81 MIL/uL   Hemoglobin 11.3 (*) 13.0 - 17.0 g/dL   HCT 33.1 (*) 39.0 - 52.0 %   MCV 97.1  78.0 - 100.0 fL   MCH 33.1  26.0 - 34.0 pg   MCHC 34.1  30.0 - 36.0 g/dL   RDW 13.0  11.5 - 15.5 %   Platelets 324  150 - 400 K/uL    ABGS No results found  for this basename: PHART, PCO2, PO2ART, TCO2, HCO3,  in the last 72 hours CULTURES Recent Results (from the past 240 hour(s))  MRSA PCR SCREENING     Status: None   Collection Time    09/29/13  1:45 AM      Result Value Ref Range Status   MRSA by PCR NEGATIVE  NEGATIVE Final   Comment:            The GeneXpert MRSA Assay (FDA     approved for NASAL specimens     only), is one component of a     comprehensive MRSA colonization     surveillance program. It is not     intended to diagnose MRSA     infection nor to guide or     monitor treatment for     MRSA infections.  CULTURE, BLOOD (ROUTINE X 2)     Status: None   Collection Time    09/29/13  4:36 AM      Result Value Ref Range Status   Specimen Description BLOOD LEFT ANTECUBITAL   Final   Special Requests BOTTLES DRAWN AEROBIC AND ANAEROBIC 10CC   Final   Culture NO GROWTH 2 DAYS   Final   Report Status PENDING   Incomplete  CULTURE, BLOOD (ROUTINE X 2)     Status: None   Collection Time    09/29/13  4:36 AM      Result Value Ref Range Status   Specimen Description BLOOD LEFT HAND   Final   Special Requests BOTTLES DRAWN AEROBIC AND ANAEROBIC 8CC   Final   Culture NO GROWTH 2 DAYS   Final   Report Status PENDING   Incomplete   Studies/Results: Ct Chest W Contrast  10/01/2013   CLINICAL DATA:  Multifocal pneumonia, short of breath and hypoxic  EXAM: CT CHEST WITH CONTRAST  TECHNIQUE: Multidetector CT imaging of  the chest was performed during intravenous contrast administration.  CONTRAST:  63m OMNIPAQUE IOHEXOL 300 MG/ML  SOLN  COMPARISON:  Chest x-ray 09/28/2013; CT abdomen/ pelvis 09/19/2013  FINDINGS: Mediastinum: Unremarkable appearance of the thyroid gland. Nonspecific, not enlarged by CT criteria mediastinal nodes. Low left paratracheal station node measures 8 mm in short axis. Low a right paratracheal station node measures 7 mm in short axis. Right infrahilar station node measures 1.1 cm in short axis. Mildly patulous  thoracic esophagus containing a small amount of fluid. Mild circumferential thickening about the distal thoracic esophagus. No hiatal hernia.  Heart/Vascular: Conventional 3 vessel arch. No aneurysmal dilatation or evidence of acute dissection. Atherosclerotic calcifications present within the coronary arteries. The heart is borderline enlarged. No pericardial effusion. Normal size pulmonary arteries. No large central PE.  Lungs/Pleura: Extensive patchy ground-glass attenuation throughout all lobes of both lungs. In the mid and lower lung zones there is diffuse bronchial wall thickening. Interstitial opacities are also present throughout the mid and lower lungs yielding a crazy paving pattern in some regions. The airways are clear. No evidence of obstruction. No pleural effusion. Compared to the visualized lower lungs seen on 09/19/2013 there has been a significant interval progression of parenchymal disease.  Bones/Soft Tissues: No acute fracture or aggressive appearing lytic or blastic osseous lesion.  Upper Abdomen: Visualized upper abdominal organs are unremarkable.  IMPRESSION: Extensive bilateral ground-glass attenuation airspace disease with superimposed interlobular septal thickening (crazy paving pattern) and diffuse bronchial wall thickening. The imaging findings are nonspecific and the differential diagnosis is broad including multifocal bacterial pneumonia, atypical pneumonia (mycoplasma, PCP pneumonia in the appropriate clinical setting and viral pneumonia), ARDS and acute interstitial pneumonia. Additional less likely etiologies include drug induced pneumonitis, diffuse alveolar hemorrhage and pulmonary alveolar proteinosis.  Mildly prominent mediastinal lymph nodes are likely reactive.  Atherosclerosis including coronary artery disease.   Electronically Signed   By: HJacqulynn CadetM.D.   On: 10/01/2013 11:37    Medications:  Prior to Admission:  Prescriptions prior to admission  Medication  Sig Dispense Refill  . albuterol (PROVENTIL HFA;VENTOLIN HFA) 108 (90 BASE) MCG/ACT inhaler Inhale 2 puffs into the lungs every 4 (four) hours as needed for wheezing or shortness of breath.  1 Inhaler  3  . clonazePAM (KLONOPIN) 1 MG tablet Take 1 mg by mouth daily.      . diazepam (VALIUM) 5 MG tablet Take 1 tablet (5 mg total) by mouth every 6 (six) hours as needed for muscle spasms.  10 tablet  0  . escitalopram (LEXAPRO) 20 MG tablet Take 20 mg by mouth daily.        .Marland Kitchengabapentin (NEURONTIN) 600 MG tablet Take 1,200 mg by mouth 3 (three) times daily.      .Marland Kitchenibuprofen (ADVIL,MOTRIN) 800 MG tablet Take 800 mg by mouth 2 (two) times daily as needed for moderate pain.      .Marland Kitchenlisinopril-hydrochlorothiazide (PRINZIDE,ZESTORETIC) 20-12.5 MG per tablet Take 1 tablet by mouth daily.      . naproxen (NAPROSYN) 500 MG tablet Take 1 tablet (500 mg total) by mouth 2 (two) times daily with a meal.  30 tablet  0  . QUEtiapine (SEROQUEL) 100 MG tablet Take 100 mg by mouth at bedtime.      .Marland Kitchenzolpidem (AMBIEN) 10 MG tablet Take 10 mg by mouth at bedtime.      .Marland Kitchenlevofloxacin (LEVAQUIN) 750 MG tablet Take 1 tablet (750 mg total) by mouth daily.  7 tablet  0  .  oxyCODONE-acetaminophen (PERCOCET) 5-325 MG per tablet Take 1 tablet by mouth every 4 (four) hours as needed.  20 tablet  0   Scheduled: . antiseptic oral rinse  7 mL Mouth Rinse q12n4p  . azithromycin  500 mg Intravenous Q24H  . ceFEPime (MAXIPIME) IV  1 g Intravenous Q8H  . chlorhexidine  15 mL Mouth Rinse BID  . clonazePAM  1 mg Oral Daily  . enoxaparin (LOVENOX) injection  60 mg Subcutaneous Q24H  . escitalopram  20 mg Oral Daily  . gabapentin  1,200 mg Oral TID  . guaiFENesin  1,200 mg Oral BID  . ipratropium-albuterol  3 mL Nebulization Q6H WA  . methylPREDNISolone (SOLU-MEDROL) injection  60 mg Intravenous Q6H  . pantoprazole  40 mg Oral BID  . polyethylene glycol  17 g Oral BID  . QUEtiapine  100 mg Oral QHS  . vancomycin  1,250 mg  Intravenous Q12H  . zolpidem  10 mg Oral QHS   Continuous:  KCL:[id number], diazepam, HYDROmorphone (DILAUDID) injection, ondansetron (ZOFRAN) IV, ondansetron, oxyCODONE-acetaminophen, senna  Assesment: He has acute hypoxic respiratory failure. He has healthcare associated pneumonia. I do not think he has significant COPD with a fairly short smoking history. He has obstructive sleep apnea. His CT scan shows diffuse infiltrates with a very wide differential but I think this is likely still from pneumonia . Principal Problem:   Acute respiratory failure with hypoxia Active Problems:   HCAP (healthcare-associated pneumonia)   Dysphagia, unspecified(787.20)   Acute kidney injury   GERD (gastroesophageal reflux disease)   OSA on CPAP    Plan: Since she's been on appropriate antibiotics now for about 48 hours and he's not making much improvement and he may have said definite inflammatory response I'm going to add steroids and see if it helps    LOS: 4 days   Jayd Forrey L 10/02/2013, 8:30 AM

## 2013-10-03 ENCOUNTER — Inpatient Hospital Stay (HOSPITAL_COMMUNITY): Payer: Medicaid Other

## 2013-10-03 LAB — BLOOD GAS, ARTERIAL
Acid-Base Excess: 0.3 mmol/L (ref 0.0–2.0)
Bicarbonate: 25.6 mEq/L — ABNORMAL HIGH (ref 20.0–24.0)
Delivery systems: POSITIVE
Drawn by: 105551
Expiratory PAP: 8
FIO2: 100 %
Inspiratory PAP: 10
O2 Saturation: 98.7 %
Patient temperature: 37
RATE: 8 resp/min
TCO2: 23.1 mmol/L (ref 0–100)
pCO2 arterial: 50 mmHg — ABNORMAL HIGH (ref 35.0–45.0)
pH, Arterial: 7.329 — ABNORMAL LOW (ref 7.350–7.450)
pO2, Arterial: 191 mmHg — ABNORMAL HIGH (ref 80.0–100.0)

## 2013-10-03 LAB — BASIC METABOLIC PANEL
Anion gap: 11 (ref 5–15)
BUN: 24 mg/dL — ABNORMAL HIGH (ref 6–23)
CO2: 27 mEq/L (ref 19–32)
Calcium: 8.7 mg/dL (ref 8.4–10.5)
Chloride: 99 mEq/L (ref 96–112)
Creatinine, Ser: 0.99 mg/dL (ref 0.50–1.35)
GFR calc Af Amer: >90 mL/min (ref >90)
GFR calc non Af Amer: 85 mL/min — ABNORMAL LOW (ref >90)
Glucose, Bld: 214 mg/dL — ABNORMAL HIGH (ref 70–99)
Potassium: 4.7 mEq/L (ref 3.7–5.3)
Sodium: 137 mEq/L (ref 137–147)

## 2013-10-03 LAB — CBC WITH DIFFERENTIAL/PLATELET
Band Neutrophils: 0 % (ref 0–10)
Basophils Absolute: 0 10*3/uL (ref 0.0–0.1)
Basophils Relative: 0 % (ref 0–1)
Blasts: 0 %
Eosinophils Absolute: 0 10*3/uL (ref 0.0–0.7)
Eosinophils Relative: 0 % (ref 0–5)
HCT: 31.5 % — ABNORMAL LOW (ref 39.0–52.0)
Hemoglobin: 10.8 g/dL — ABNORMAL LOW (ref 13.0–17.0)
Lymphocytes Relative: 3 % — ABNORMAL LOW (ref 12–46)
Lymphs Abs: 0.6 10*3/uL — ABNORMAL LOW (ref 0.7–4.0)
MCH: 33.1 pg (ref 26.0–34.0)
MCHC: 34.3 g/dL (ref 30.0–36.0)
MCV: 96.6 fL (ref 78.0–100.0)
Metamyelocytes Relative: 0 %
Monocytes Absolute: 1.5 10*3/uL — ABNORMAL HIGH (ref 0.1–1.0)
Monocytes Relative: 7 % (ref 3–12)
Myelocytes: 0 %
Neutro Abs: 18.7 10*3/uL — ABNORMAL HIGH (ref 1.7–7.7)
Neutrophils Relative %: 90 % — ABNORMAL HIGH (ref 43–77)
Platelets: 266 10*3/uL (ref 150–400)
Promyelocytes Absolute: 0 %
RBC: 3.26 MIL/uL — ABNORMAL LOW (ref 4.22–5.81)
RDW: 12.9 % (ref 11.5–15.5)
WBC: 20.8 10*3/uL — ABNORMAL HIGH (ref 4.0–10.5)
nRBC: 0 /100 WBC

## 2013-10-03 LAB — VANCOMYCIN, TROUGH: Vancomycin Tr: 11.1 ug/mL (ref 10.0–20.0)

## 2013-10-03 MED ORDER — VANCOMYCIN HCL 10 G IV SOLR
1500.0000 mg | Freq: Two times a day (BID) | INTRAVENOUS | Status: DC
  Administered 2013-10-03 – 2013-10-09 (×12): 1500 mg via INTRAVENOUS
  Filled 2013-10-03 (×19): qty 1500

## 2013-10-03 MED ORDER — GABAPENTIN 300 MG PO CAPS
600.0000 mg | ORAL_CAPSULE | Freq: Three times a day (TID) | ORAL | Status: DC
  Administered 2013-10-03 – 2013-10-07 (×14): 600 mg via ORAL
  Administered 2013-10-08: 300 mg via ORAL
  Administered 2013-10-08 (×2): 600 mg via ORAL
  Administered 2013-10-09: 300 mg via ORAL
  Administered 2013-10-10: 600 mg via ORAL
  Filled 2013-10-03 (×23): qty 2

## 2013-10-03 NOTE — Progress Notes (Signed)
ANTIBIOTIC CONSULT NOTE  Pharmacy Consult for Vancomycin, Meropenem Indication: pneumonia  Allergies  Allergen Reactions  . Flexeril [Cyclobenzaprine Hcl] Hives  . Other Rash    Raw tobacco   Patient Measurements: Height:  (180.3 cm) Weight: 261 lb 0.4 oz (118.4 kg) IBW/kg (Calculated) : 75.3  Vital Signs: Temp: 98.1 F (36.7 C) (09/05 0800) Temp src: Axillary (09/05 0800) BP: 121/84 mmHg (09/05 1000) Pulse Rate: 102 (09/05 1000) Intake/Output from previous day: 09/04 0701 - 09/05 0700 In: 2440 [P.O.:1340; IV Piggyback:1100] Out: 1600 [Urine:1600] Intake/Output from this shift:    Labs:  Recent Labs  10/01/13 0415 10/02/13 0422 10/03/13 0433  WBC 14.4* 14.7* 20.8*  HGB 12.0* 11.3* 10.8*  PLT 344 324 266  CREATININE  --  1.00 0.99   Estimated Creatinine Clearance: 98.6 ml/min (by C-G formula based on Cr of 0.99).  Recent Labs  10/03/13 0903  VANCOTROUGH 11.1     Microbiology: Recent Results (from the past 720 hour(s))  MRSA PCR SCREENING     Status: None   Collection Time    09/29/13  1:45 AM      Result Value Ref Range Status   MRSA by PCR NEGATIVE  NEGATIVE Final   Comment:            The GeneXpert MRSA Assay (FDA     approved for NASAL specimens     only), is one component of a     comprehensive MRSA colonization     surveillance program. It is not     intended to diagnose MRSA     infection nor to guide or     monitor treatment for     MRSA infections.  CULTURE, BLOOD (ROUTINE X 2)     Status: None   Collection Time    09/29/13  4:36 AM      Result Value Ref Range Status   Specimen Description BLOOD LEFT ANTECUBITAL   Final   Special Requests BOTTLES DRAWN AEROBIC AND ANAEROBIC 10CC   Final   Culture NO GROWTH 4 DAYS   Final   Report Status PENDING   Incomplete  CULTURE, BLOOD (ROUTINE X 2)     Status: None   Collection Time    09/29/13  4:36 AM      Result Value Ref Range Status   Specimen Description BLOOD LEFT HAND   Final   Special Requests BOTTLES DRAWN AEROBIC AND ANAEROBIC 8CC   Final   Culture NO GROWTH 4 DAYS   Final   Report Status PENDING   Incomplete   Medical History: Past Medical History  Diagnosis Date  . Sciatica   . Bulging discs     neck  . Torn rotator cuff   . PTSD (post-traumatic stress disorder)   . Depression   . Anxiety   . Hypertension   . Sleep apnea     supposed to wear CPAP but doesn't  . Syncopal episodes     pt states "it happens after i try to get up after smoking a cigarette"   Anti-infectives   Start     Dose/Rate Route Frequency Ordered Stop   10/03/13 2200  vancomycin (VANCOCIN) 1,500 mg in sodium chloride 0.9 % 500 mL IVPB     1,500 mg 250 mL/hr over 120 Minutes Intravenous Every 12 hours 10/03/13 1012     10/02/13 1400  meropenem (MERREM) 1 g in sodium chloride 0.9 % 100 mL IVPB     1 g 200 mL/hr  over 30 Minutes Intravenous 3 times per day 10/02/13 0918     10/01/13 2200  azithromycin (ZITHROMAX) 500 mg in dextrose 5 % 250 mL IVPB     500 mg 250 mL/hr over 60 Minutes Intravenous Every 24 hours 10/01/13 1153     09/30/13 2200  vancomycin (VANCOCIN) 1,250 mg in sodium chloride 0.9 % 250 mL IVPB  Status:  Discontinued     1,250 mg 166.7 mL/hr over 90 Minutes Intravenous Every 12 hours 09/30/13 1020 10/03/13 1012   09/30/13 1000  ceFEPIme (MAXIPIME) 1 g in dextrose 5 % 50 mL IVPB  Status:  Discontinued     1 g 100 mL/hr over 30 Minutes Intravenous Every 8 hours 09/30/13 0851 10/02/13 0906   09/30/13 1000  vancomycin (VANCOCIN) 2,000 mg in sodium chloride 0.9 % 500 mL IVPB     2,000 mg 250 mL/hr over 120 Minutes Intravenous  Once 09/30/13 0902 09/30/13 1244   09/29/13 2200  azithromycin (ZITHROMAX) 500 mg in dextrose 5 % 250 mL IVPB  Status:  Discontinued     500 mg 250 mL/hr over 60 Minutes Intravenous Every 24 hours 09/29/13 0202 10/01/13 0848   09/29/13 2100  cefTRIAXone (ROCEPHIN) 1 g in dextrose 5 % 50 mL IVPB  Status:  Discontinued     1 g 100 mL/hr over 30  Minutes Intravenous Every 24 hours 09/29/13 0202 09/30/13 0851   09/29/13 0000  azithromycin (ZITHROMAX) 500 mg in dextrose 5 % 250 mL IVPB     500 mg 250 mL/hr over 60 Minutes Intravenous  Once 09/28/13 2348 09/29/13 0212   09/29/13 0000  cefTRIAXone (ROCEPHIN) 1 g in dextrose 5 % 50 mL IVPB     1 g 100 mL/hr over 30 Minutes Intravenous  Once 09/28/13 2348 09/29/13 0100      Assessment: 63yo male with acute hypoxic respiratory failure secondary to pneumonia.  Pt failed OP Levaquin.  Cefepime was changed to Meropenem for better anaerobic coverage.  SCr stable. Patient clinically improved, with steroid and antibiotics. Vancomycin trough below goal.  Meropenem 9/4>> Vancomycin 9/2>> Cefepime 9/2>>9/4 Zithromax 9/1>> Rocephin 9/1>>9/2  Goal of Therapy:  Vancomycin trough level 15-20 mcg/ml Eradicate infection.  Plan:   Continue Meropenem 1gm IV q8h  Change Vancomycin 1500 mg IV q12hrs  Continue Zithromax per MD for atypical coverage  Check Vancomycin trough level at steady state  Monitor labs, renal fxn, and cultures  Raquel James, Elyjah Hazan Bennett 10/03/2013,10:14 AM

## 2013-10-03 NOTE — Progress Notes (Signed)
PROGRESS NOTE  Max [name] ZOX:[id number] DOB: 05-Dec-1950 DOA: 09/28/2013 PCP: Dorrene German, MD  Summary: 63 year old man with history of obstructive sleep apnea, asbestos exposure presented with increasing shortness of breath and was admitted for acute hypoxic respiratory failure and pneumonia. Oxygen requirement increased and antibiotics were broadened. CT chest revealed diffuse bilateral airspace disease. Other laboratory workup has been unrevealing, patient followed by pulmonology, plan to continue empiric antibiotics and steroids at this time for suspected inflammatory component. Currently stable on high flow oxygen with intermittent BiPAP.  Assessment/Plan: 1. Acute hypoxic respiratory failure secondary to HCAP. Modest clinical improvement. 2. Healthcare associated pneumonia. Modest clinical improvement. 3. Acute kidney injury. Resolved. Secondary to poor oral intake, Naprosyn. Also lisinopril, hydrochlorothiazide.  4. Dysphagia. History of esophageal stricture requiring dilatation. Followed by gastroenterology in [place] but requests f/u here in [place]. CT showed thickening of distal esophageal wall. Outpatient followup recommended. 5. PTSD, depression, anxiety. Stable. 6. OSA, CPAP 7. Tobacco dependence in remission   Clinically somewhat improved with stable hypoxia. Discussed with pulmonology. Plan continue empiric antibiotics for healthcare associated pneumonia, seems to have improved with steroids which will be continued. BiPAP as needed. No indication for intubation at this point.   Code Status: full code DVT prophylaxis: Lovenox Family Communication: none prseent Disposition Plan: home  Brendia Sacks, MD  [facility]  Pager [phone] If 7PM-7AM, please contact night-coverage at [email or web address], password [facility] 10/03/2013, 8:11 AM  LOS: 5 days   Consultants:  Pulmonology  Procedures:    Antibiotics:  Zithromax 9/1 >>   Vancomycin 9/2 >>  Merrem  9/4 >>  Cefepime 9/2 >> 9/4  Ceftriaxone 9/1  HPI/Subjective: On BiPAP last night, mainly to attempt to wean off 100% nonrebreather. Tolerated well. ABG today shows respiratory acidosis.  He continues to feel better. Breathing better. Pain well-controlled.  Objective: Filed Vitals:   10/03/13 0430 10/03/13 0500 10/03/13 0530 10/03/13 0600  BP: 103/66 109/70 105/74 126/77  Pulse: 86 82 77 87  Temp:      TempSrc:      Resp: Height:      Weight:    118.4 kg (261 lb 0.4 oz)  SpO2: 97% 97% 97% 95%    Intake/Output Summary (Last 24 hours) at 10/03/13 0811 Last data filed at 10/03/13 0600  Gross per 24 hour  Intake   2080 ml  Output    900 ml  Net   1180 ml     Filed Weights   09/30/13 0500 10/01/13 0500 10/03/13 0600  Weight: 113.3 kg (249 lb 12.5 oz) 112.8 kg (248 lb 10.9 oz) 118.4 kg (261 lb 0.4 oz)    Exam:     Afebrile, vitals are stable. Stable hypoxia.  Gen. Appears calm, comfortable. Appears somewhat better today although still acutely ill.  Cardiovascular regular rate and rhythm. No murmur, rub or gallop. Telemetry sinus rhythm. No lower extremity edema.  Respiratory clear to auscultation bilaterally. Good air movement. No wheezes, rales or rhonchi. Moderate increased respiratory effort. Able to speak in short sentences.  Abdomen soft.  Eyes appear grossly unremarkable  ENT grossly unremarkable lips and tongue appear unremarkable.  Skin appears grossly unremarkable.  Musculoskeletal grossly unremarkable.  Data Reviewed:  Excellent urine output.  ABG with modest respiratory acidosis with pH 7.329, PCO2 50.  Basic metabolic panel unremarkable. WBC has increased 20.8 (on steroids). Hemoglobin stable.   Chest x-ray independently reviewed with diffuse bilateral airspace disease.  Scheduled Meds: . antiseptic oral rinse  7  mL Mouth Rinse q12n4p  . azithromycin  500 mg Intravenous Q24H  . chlorhexidine  15 mL Mouth Rinse BID  .  clonazePAM  1 mg Oral Daily  . enoxaparin (LOVENOX) injection  60 mg Subcutaneous Q24H  . escitalopram  20 mg Oral Daily  . gabapentin  1,200 mg Oral TID  . guaiFENesin  1,200 mg Oral BID  . ipratropium-albuterol  3 mL Nebulization Q6H WA  . meropenem (MERREM) IV  1 g Intravenous 3 times per day  . methylPREDNISolone (SOLU-MEDROL) injection  60 mg Intravenous Q6H  . pantoprazole  40 mg Oral BID  . polyethylene glycol  17 g Oral BID  . QUEtiapine  100 mg Oral QHS  . sodium chloride  10-40 mL Intracatheter Q12H  . vancomycin  1,250 mg Intravenous Q12H  . zolpidem  10 mg Oral QHS   Continuous Infusions:    Principal Problem:   Acute respiratory failure with hypoxia Active Problems:   HCAP (healthcare-associated pneumonia)   Dysphagia, unspecified(787.20)   Acute kidney injury   GERD (gastroesophageal reflux disease)   OSA on CPAP   Time spent 20  minutes

## 2013-10-03 NOTE — Progress Notes (Signed)
Placed PT on BiPAP 10/8 , FiO2 60 . Working toward weaning him off high oxygen. He still  Desaturates very quickly.

## 2013-10-03 NOTE — Progress Notes (Signed)
Subjective: He says he feels better. He continues on nonrebreather mask and oxygen saturations are in the mid 90s. His appetite has improved he does not feel as short of breath  Objective: Vital signs in last 24 hours: Temp:  [97.1 F (36.2 C)-98.9 F (37.2 C)] 97.8 F (36.6 C) (09/05 0400) Pulse Rate:  [77-109] 87 (09/05 0600) Resp:  [13-30] 24 (09/05 0600) BP: (80-126)/(39-93) 126/77 mmHg (09/05 0600) SpO2:  [88 %-98 %] 92 % (09/05 0818) FiO2 (%):  [100 %] 100 % (09/05 0152) Weight:  [118.4 kg (261 lb 0.4 oz)] 118.4 kg (261 lb 0.4 oz) (09/05 0600) Weight change:  Last BM Date: 09/30/13 (Per the patient)  Intake/Output from previous day: 09/04 0701 - 09/05 0700 In: 2440 [P.O.:1340; IV Piggyback:1100] Out: 1600 [Urine:1600]  PHYSICAL EXAM General appearance: alert, cooperative and mild distress Resp: rhonchi bilaterally Cardio: regular rate and rhythm, S1, S2 normal, no murmur, click, rub or gallop GI: soft, non-tender; bowel sounds normal; no masses,  no organomegaly Extremities: extremities normal, atraumatic, no cyanosis or edema  Lab Results:  Results for orders placed during the hospital encounter of 09/28/13 (from the past 48 hour(s))  BASIC METABOLIC PANEL     Status: Abnormal   Collection Time    10/02/13  4:22 AM      Result Value Ref Range   Sodium 137  137 - 147 mEq/L   Potassium 4.5  3.7 - 5.3 mEq/L   Chloride 99  96 - 112 mEq/L   CO2 26  19 - 32 mEq/L   Glucose, Bld 118 (*) 70 - 99 mg/dL   BUN 17  6 - 23 mg/dL   Creatinine, Ser 1.00  0.50 - 1.35 mg/dL   Calcium 8.6  8.4 - 10.5 mg/dL   GFR calc non Af Amer 78 (*) >90 mL/min   GFR calc Af Amer 90 (*) >90 mL/min   Comment: (NOTE)     The eGFR has been calculated using the CKD EPI equation.     This calculation has not been validated in all clinical situations.     eGFR's persistently <90 mL/min signify possible Chronic Kidney     Disease.   Anion gap 12  5 - 15  CBC     Status: Abnormal   Collection  Time    10/02/13  4:22 AM      Result Value Ref Range   WBC 14.7 (*) 4.0 - 10.5 K/uL   RBC 3.41 (*) 4.22 - 5.81 MIL/uL   Hemoglobin 11.3 (*) 13.0 - 17.0 g/dL   HCT 33.1 (*) 39.0 - 52.0 %   MCV 97.1  78.0 - 100.0 fL   MCH 33.1  26.0 - 34.0 pg   MCHC 34.1  30.0 - 36.0 g/dL   RDW 13.0  11.5 - 15.5 %   Platelets 324  150 - 400 K/uL  BASIC METABOLIC PANEL     Status: Abnormal   Collection Time    10/03/13  4:33 AM      Result Value Ref Range   Sodium 137  137 - 147 mEq/L   Potassium 4.7  3.7 - 5.3 mEq/L   Chloride 99  96 - 112 mEq/L   CO2 27  19 - 32 mEq/L   Glucose, Bld 214 (*) 70 - 99 mg/dL   BUN 24 (*) 6 - 23 mg/dL   Creatinine, Ser 0.99  0.50 - 1.35 mg/dL   Calcium 8.7  8.4 - 10.5 mg/dL   GFR  calc non Af Amer 85 (*) >90 mL/min   GFR calc Af Amer >90  >90 mL/min   Comment: (NOTE)     The eGFR has been calculated using the CKD EPI equation.     This calculation has not been validated in all clinical situations.     eGFR's persistently <90 mL/min signify possible Chronic Kidney     Disease.   Anion gap 11  5 - 15  CBC WITH DIFFERENTIAL     Status: Abnormal   Collection Time    10/03/13  4:33 AM      Result Value Ref Range   WBC 20.8 (*) 4.0 - 10.5 K/uL   RBC 3.26 (*) 4.22 - 5.81 MIL/uL   Hemoglobin 10.8 (*) 13.0 - 17.0 g/dL   HCT 31.5 (*) 39.0 - 52.0 %   MCV 96.6  78.0 - 100.0 fL   MCH 33.1  26.0 - 34.0 pg   MCHC 34.3  30.0 - 36.0 g/dL   RDW 12.9  11.5 - 15.5 %   Platelets 266  150 - 400 K/uL   Neutrophils Relative % 90 (*) 43 - 77 %   Lymphocytes Relative 3 (*) 12 - 46 %   Monocytes Relative 7  3 - 12 %   Eosinophils Relative 0  0 - 5 %   Basophils Relative 0  0 - 1 %   Band Neutrophils 0  0 - 10 %   Metamyelocytes Relative 0     Myelocytes 0     Promyelocytes Absolute 0     Blasts 0     nRBC 0  0 /100 WBC   Neutro Abs 18.7 (*) 1.7 - 7.7 K/uL   Lymphs Abs 0.6 (*) 0.7 - 4.0 K/uL   Monocytes Absolute 1.5 (*) 0.1 - 1.0 K/uL   Eosinophils Absolute 0.0  0.0 - 0.7  K/uL   Basophils Absolute 0.0  0.0 - 0.1 K/uL  BLOOD GAS, ARTERIAL     Status: Abnormal   Collection Time    10/03/13  5:55 AM      Result Value Ref Range   FIO2 100.00     Delivery systems BILEVEL POSITIVE AIRWAY PRESSURE     Mode OXYGEN MASK     Rate 8     Inspiratory PAP 10     Expiratory PAP 8     pH, Arterial 7.329 (*) 7.350 - 7.450   pCO2 arterial 50.0 (*) 35.0 - 45.0 mmHg   pO2, Arterial 191.0 (*) 80.0 - 100.0 mmHg   Bicarbonate 25.6 (*) 20.0 - 24.0 mEq/L   TCO2 23.1  0 - 100 mmol/L   Acid-Base Excess 0.3  0.0 - 2.0 mmol/L   O2 Saturation 98.7     Patient temperature 37.0     Collection site RADIAL     Drawn by [id number]     Sample type ARTERIAL     Allens test (pass/fail) PASS  PASS    ABGS  Recent Labs  10/03/13 0555  PHART 7.329*  PO2ART 191.0*  TCO2 23.1  HCO3 25.6*   CULTURES Recent Results (from the past 240 hour(s))  MRSA PCR SCREENING     Status: None   Collection Time    09/29/13  1:45 AM      Result Value Ref Range Status   MRSA by PCR NEGATIVE  NEGATIVE Final   Comment:            The GeneXpert MRSA Assay (FDA  approved for NASAL specimens     only), is one component of a     comprehensive MRSA colonization     surveillance program. It is not     intended to diagnose MRSA     infection nor to guide or     monitor treatment for     MRSA infections.  CULTURE, BLOOD (ROUTINE X 2)     Status: None   Collection Time    09/29/13  4:36 AM      Result Value Ref Range Status   Specimen Description BLOOD LEFT ANTECUBITAL   Final   Special Requests BOTTLES DRAWN AEROBIC AND ANAEROBIC 10CC   Final   Culture NO GROWTH 2 DAYS   Final   Report Status PENDING   Incomplete  CULTURE, BLOOD (ROUTINE X 2)     Status: None   Collection Time    09/29/13  4:36 AM      Result Value Ref Range Status   Specimen Description BLOOD LEFT HAND   Final   Special Requests BOTTLES DRAWN AEROBIC AND ANAEROBIC 8CC   Final   Culture NO GROWTH 2 DAYS   Final   Report  Status PENDING   Incomplete   Studies/Results: Ct Chest W Contrast  10/01/2013   CLINICAL DATA:  Multifocal pneumonia, short of breath and hypoxic  EXAM: CT CHEST WITH CONTRAST  TECHNIQUE: Multidetector CT imaging of the chest was performed during intravenous contrast administration.  CONTRAST:  57m OMNIPAQUE IOHEXOL 300 MG/ML  SOLN  COMPARISON:  Chest x-ray 09/28/2013; CT abdomen/ pelvis 09/19/2013  FINDINGS: Mediastinum: Unremarkable appearance of the thyroid gland. Nonspecific, not enlarged by CT criteria mediastinal nodes. Low left paratracheal station node measures 8 mm in short axis. Low a right paratracheal station node measures 7 mm in short axis. Right infrahilar station node measures 1.1 cm in short axis. Mildly patulous thoracic esophagus containing a small amount of fluid. Mild circumferential thickening about the distal thoracic esophagus. No hiatal hernia.  Heart/Vascular: Conventional 3 vessel arch. No aneurysmal dilatation or evidence of acute dissection. Atherosclerotic calcifications present within the coronary arteries. The heart is borderline enlarged. No pericardial effusion. Normal size pulmonary arteries. No large central PE.  Lungs/Pleura: Extensive patchy ground-glass attenuation throughout all lobes of both lungs. In the mid and lower lung zones there is diffuse bronchial wall thickening. Interstitial opacities are also present throughout the mid and lower lungs yielding a crazy paving pattern in some regions. The airways are clear. No evidence of obstruction. No pleural effusion. Compared to the visualized lower lungs seen on 09/19/2013 there has been a significant interval progression of parenchymal disease.  Bones/Soft Tissues: No acute fracture or aggressive appearing lytic or blastic osseous lesion.  Upper Abdomen: Visualized upper abdominal organs are unremarkable.  IMPRESSION: Extensive bilateral ground-glass attenuation airspace disease with superimposed interlobular septal  thickening (crazy paving pattern) and diffuse bronchial wall thickening. The imaging findings are nonspecific and the differential diagnosis is broad including multifocal bacterial pneumonia, atypical pneumonia (mycoplasma, PCP pneumonia in the appropriate clinical setting and viral pneumonia), ARDS and acute interstitial pneumonia. Additional less likely etiologies include drug induced pneumonitis, diffuse alveolar hemorrhage and pulmonary alveolar proteinosis.  Mildly prominent mediastinal lymph nodes are likely reactive.  Atherosclerosis including coronary artery disease.   Electronically Signed   By: HJacqulynn CadetM.D.   On: 10/01/2013 11:37   Dg Chest Port 1 View  10/03/2013   CLINICAL DATA:  Follow-up pneumonia  EXAM: PORTABLE CHEST - 1 VIEW  COMPARISON:  10/02/2013  FINDINGS: Diffuse bilateral airspace opacities with coarse interstitial markings, unchanged. No definite pleural effusions. No pneumothorax.  The heart is top-normal in size.  Stable left arm PICC.  IMPRESSION: Diffuse bilateral airspace opacities with coarse interstitial markings, unchanged.   Electronically Signed   By: Julian Hy M.D.   On: 10/03/2013 08:26   Dg Chest Port 1 View  10/02/2013   CLINICAL DATA:  PICC line placement.  EXAM: PORTABLE CHEST - 1 VIEW  COMPARISON:  09/28/2013.  Chest CT 10/01/2013.  FINDINGS: Left PICC line is in place with the tip in the SVC.  Low lung volumes with severe diffuse bilateral airspace disease, similar to prior CT.  IMPRESSION: Left PICC line tip in the SVC. Severe diffuse bilateral airspace disease.   Electronically Signed   By: Rolm Baptise M.D.   On: 10/02/2013 12:13    Medications:  Prior to Admission:  Prescriptions prior to admission  Medication Sig Dispense Refill  . albuterol (PROVENTIL HFA;VENTOLIN HFA) 108 (90 BASE) MCG/ACT inhaler Inhale 2 puffs into the lungs every 4 (four) hours as needed for wheezing or shortness of breath.  1 Inhaler  3  . clonazePAM (KLONOPIN) 1 MG  tablet Take 1 mg by mouth daily.      . diazepam (VALIUM) 5 MG tablet Take 1 tablet (5 mg total) by mouth every 6 (six) hours as needed for muscle spasms.  10 tablet  0  . escitalopram (LEXAPRO) 20 MG tablet Take 20 mg by mouth daily.        Marland Kitchen gabapentin (NEURONTIN) 600 MG tablet Take 1,200 mg by mouth 3 (three) times daily.      Marland Kitchen ibuprofen (ADVIL,MOTRIN) 800 MG tablet Take 800 mg by mouth 2 (two) times daily as needed for moderate pain.      Marland Kitchen lisinopril-hydrochlorothiazide (PRINZIDE,ZESTORETIC) 20-12.5 MG per tablet Take 1 tablet by mouth daily.      . naproxen (NAPROSYN) 500 MG tablet Take 1 tablet (500 mg total) by mouth 2 (two) times daily with a meal.  30 tablet  0  . QUEtiapine (SEROQUEL) 100 MG tablet Take 100 mg by mouth at bedtime.      Marland Kitchen zolpidem (AMBIEN) 10 MG tablet Take 10 mg by mouth at bedtime.      Marland Kitchen levofloxacin (LEVAQUIN) 750 MG tablet Take 1 tablet (750 mg total) by mouth daily.  7 tablet  0  . oxyCODONE-acetaminophen (PERCOCET) 5-325 MG per tablet Take 1 tablet by mouth every 4 (four) hours as needed.  20 tablet  0   Scheduled: . antiseptic oral rinse  7 mL Mouth Rinse q12n4p  . azithromycin  500 mg Intravenous Q24H  . chlorhexidine  15 mL Mouth Rinse BID  . clonazePAM  1 mg Oral Daily  . enoxaparin (LOVENOX) injection  60 mg Subcutaneous Q24H  . escitalopram  20 mg Oral Daily  . gabapentin  1,200 mg Oral TID  . guaiFENesin  1,200 mg Oral BID  . ipratropium-albuterol  3 mL Nebulization Q6H WA  . meropenem (MERREM) IV  1 g Intravenous 3 times per day  . methylPREDNISolone (SOLU-MEDROL) injection  60 mg Intravenous Q6H  . pantoprazole  40 mg Oral BID  . polyethylene glycol  17 g Oral BID  . QUEtiapine  100 mg Oral QHS  . sodium chloride  10-40 mL Intracatheter Q12H  . vancomycin  1,250 mg Intravenous Q12H  . zolpidem  10 mg Oral QHS   Continuous:  DVV:[id number], diazepam, HYDROmorphone (DILAUDID)  injection, ondansetron (ZOFRAN) IV, ondansetron,  oxyCODONE-acetaminophen, senna, sodium chloride  Assesment: He has acute hypoxic respiratory failure. He has healthcare associated pneumonia. He has a history of obstructive sleep apnea. He has significant bilateral pneumonia involving all lobes. He is on appropriate antibiotics and seems to have improved with the addition of steroids Principal Problem:   Acute respiratory failure with hypoxia Active Problems:   HCAP (healthcare-associated pneumonia)   Dysphagia, unspecified(787.20)   Acute kidney injury   GERD (gastroesophageal reflux disease)   OSA on CPAP    Plan: Continue current treatments. I will be out of town the next 2 days    LOS: 5 days   Twan Harkin L 10/03/2013, 8:29 AM

## 2013-10-03 NOTE — Progress Notes (Signed)
Placing a Child psychotherapist consult due to concern about patient's pain medication usage. This evening he has requested that we alternate his pain medication so that he is receiving something for pain every 2 hours. Educated patient that his respiratory status is not improving and that if he received too much pain medication it could make it worse. Also explained a concern that if his pain medication usage was reviewed and appeared to be excessive then it could be discontinued. Patient stated that he "would just get his dealers to bring him some pain pills".

## 2013-10-03 NOTE — Progress Notes (Addendum)
Took PT off CPAP at his request, he seems fine but oxygen drops very quickly. Suspect he will need back soon.

## 2013-10-03 NOTE — Progress Notes (Signed)
PT placed back on BiPAP trying to adjust for him. 10/8 f8 100% . He appears well except for SaO2 drops very quickly when taken off mask.

## 2013-10-03 NOTE — Progress Notes (Signed)
Shift summary: Patient has been difficult overnight with compliance to wearing oxygen. He states that he does not like wearing the Bipap even though he understands it is better for him. He wore it off and on alternating with 100% non rebreather. Oxygen saturations drop very quickly whenever he takes the oxygen off but increase slowly after putting it back on. Patient has repeatedly asked for pain medication, educated patient about the pain medication ordered and how often he can have it. He continues to ask that he get both the Dilaudid and Percocet at the same time, educated him that this would be too much pain medication and that his respiratory status would decline. When giving patient the Dilaudid IV he asks for it to be pushed quickly so that "he can taste it". Explained to patient that this medication had to be given slowly to ensure adequate respiratory status.

## 2013-10-04 LAB — CULTURE, BLOOD (ROUTINE X 2)
Culture: NO GROWTH
Culture: NO GROWTH

## 2013-10-04 LAB — BLOOD GAS, ARTERIAL
Acid-Base Excess: 2.1 mmol/L — ABNORMAL HIGH (ref 0.0–2.0)
Bicarbonate: 26.8 mEq/L — ABNORMAL HIGH (ref 20.0–24.0)
Drawn by: 23534
O2 Content: 100 L/min
O2 Saturation: 91.2 %
Patient temperature: 37
TCO2: 24.7 mmol/L (ref 0–100)
pCO2 arterial: 47.6 mmHg — ABNORMAL HIGH (ref 35.0–45.0)
pH, Arterial: 7.37 (ref 7.350–7.450)
pO2, Arterial: 64.2 mmHg — ABNORMAL LOW (ref 80.0–100.0)

## 2013-10-04 MED ORDER — ALUM & MAG HYDROXIDE-SIMETH 200-200-20 MG/5ML PO SUSP
30.0000 mL | Freq: Once | ORAL | Status: AC
  Administered 2013-10-04: 30 mL via ORAL
  Filled 2013-10-04: qty 30

## 2013-10-04 NOTE — Progress Notes (Addendum)
PT has been on and off BiPAP several times.  PT has been on NRB mask most of night now. Did manage to turn oxygen down while on BiPAP to 60% but PT could never get settled enough to sleep. While awake he is asking for things or restless.

## 2013-10-04 NOTE — Progress Notes (Addendum)
PROGRESS NOTE  Max [name] ZOX:[id number] DOB: [birth year] DOA: 09/28/2013 PCP: Dorrene German, MD  Addendum ABG reviewed. Discussed case with Dr. Craige Cotta CCM via eLink, recommends continuing current care. No indication for transfer or intubation at this point.  Summary: 63 year old man with history of obstructive sleep apnea, asbestos exposure presented with increasing shortness of breath and was admitted for acute hypoxic respiratory failure and pneumonia. Oxygen requirement increased and antibiotics were broadened. CT chest revealed diffuse bilateral airspace disease. Other laboratory workup has been unrevealing, patient followed by pulmonology, plan to continue empiric antibiotics and steroids at this time for suspected inflammatory component. Currently stable on high flow oxygen with intermittent BiPAP.  Assessment/Plan: 1. Acute hypoxic respiratory failure secondary to HCAP. If remains stable, no significant improvement yet. 2. Healthcare associated pneumonia. Specific etiology unclear but seems to be responding to steroids and antibiotics. He 3. Acute kidney injury. Resolved. Secondary to poor oral intake, Naprosyn. Also lisinopril, hydrochlorothiazide.  4. Dysphagia. History of esophageal stricture requiring dilatation. Followed by gastroenterology in [place] but requests f/u here in [place]. CT showed thickening of distal esophageal wall. Outpatient followup recommended. 5. PTSD, depression, anxiety.  6. OSA, CPAP 7. Tobacco dependence in remission   Remains overall stable with guarded prognosis but maintaining oxygenation on nonrebreather and intermittent BiPAP. No indication for intubation at this point. Remains afebrile with unremarkable culture data. Plan to continue empiric antibiotics and steroids as recommended by pulmonology.   Magnesium, BMP in AM  Bowel regimen  Remain in step down unit remains critically ill. Discussed with sister at bedside.  Code Status: full  code DVT prophylaxis: Lovenox Family Communication:  Disposition Plan: home  Brendia Sacks, MD  [facility]  Pager [phone] If 7PM-7AM, please contact night-coverage at [email or web address], password [facility] 10/04/2013, 7:37 AM  LOS: 6 days   Consultants:  Pulmonology  Procedures:    Antibiotics:  Zithromax 9/1 >>   Vancomycin 9/2 >>  Merrem 9/4 >>  Cefepime 9/2 >> 9/4  Ceftriaxone 9/1  HPI/Subjective: Patient requesting frequent pain meds and making strange comments in regard to them. On and off BiPAP.  This morning he feels okay. Breathing seems to be doing better. Dominant complaint was difficulty with PTSD last night. He is eating okay.  Objective: Filed Vitals:   10/04/13 0400 10/04/13 0500 10/04/13 0600 10/04/13 0700  BP: 107/84 114/76 110/71 113/76  Pulse: 100 88 79 76  Temp: 97.8 F (36.6 C)     TempSrc: Oral     Resp: Height:      Weight:  119.7 kg (263 lb 14.3 oz)    SpO2: 92% 93% 97% 100%    Intake/Output Summary (Last 24 hours) at 10/04/13 0737 Last data filed at 10/04/13 0430  Gross per 24 hour  Intake   1800 ml  Output   1500 ml  Net    300 ml     Filed Weights   10/01/13 0500 10/03/13 0600 10/04/13 0500  Weight: 112.8 kg (248 lb 10.9 oz) 118.4 kg (261 lb 0.4 oz) 119.7 kg (263 lb 14.3 oz)    Exam:     She remains afebrile with stable vital signs. Hypoxia stable on nonrebreather.  Respiratory fair air movement. No crackles. No rhonchi, wheezes. Moderate increased respiratory effort. Able to speak in short sentences.  Cardiovascular regular rate and rhythm. No murmur, rub or gallop. No lower extremity edema. Telemetry was sinus rhythm with PVCs.  Gen. Appears mildly anxious, mildly uncomfortable. He will nontoxic.  Psychiatric grossly normal mood and affect. Speech fluent and appropriate.  Skin appears grossly unremarkable.   Moves all extremities well.  Data Reviewed:  UOP 1500  Sputum culture  pending  Scheduled Meds: . antiseptic oral rinse  7 mL Mouth Rinse q12n4p  . azithromycin  500 mg Intravenous Q24H  . chlorhexidine  15 mL Mouth Rinse BID  . clonazePAM  1 mg Oral Daily  . enoxaparin (LOVENOX) injection  60 mg Subcutaneous Q24H  . escitalopram  20 mg Oral Daily  . gabapentin  600 mg Oral TID  . guaiFENesin  1,200 mg Oral BID  . ipratropium-albuterol  3 mL Nebulization Q6H WA  . meropenem (MERREM) IV  1 g Intravenous 3 times per day  . methylPREDNISolone (SOLU-MEDROL) injection  60 mg Intravenous Q6H  . pantoprazole  40 mg Oral BID  . polyethylene glycol  17 g Oral BID  . QUEtiapine  100 mg Oral QHS  . sodium chloride  10-40 mL Intracatheter Q12H  . vancomycin  1,500 mg Intravenous Q12H  . zolpidem  10 mg Oral QHS   Continuous Infusions:    Principal Problem:   Acute respiratory failure with hypoxia Active Problems:   HCAP (healthcare-associated pneumonia)   Dysphagia, unspecified(787.20)   Acute kidney injury   GERD (gastroesophageal reflux disease)   OSA on CPAP   Time spent 20 minutes

## 2013-10-05 LAB — CBC
HCT: 33.3 % — ABNORMAL LOW (ref 39.0–52.0)
Hemoglobin: 11 g/dL — ABNORMAL LOW (ref 13.0–17.0)
MCH: 33.2 pg (ref 26.0–34.0)
MCHC: 33 g/dL (ref 30.0–36.0)
MCV: 100.6 fL — ABNORMAL HIGH (ref 78.0–100.0)
Platelets: 264 10*3/uL (ref 150–400)
RBC: 3.31 MIL/uL — ABNORMAL LOW (ref 4.22–5.81)
RDW: 12.6 % (ref 11.5–15.5)
WBC: 24.8 10*3/uL — ABNORMAL HIGH (ref 4.0–10.5)

## 2013-10-05 LAB — CK: Total CK: 28 U/L (ref 7–232)

## 2013-10-05 LAB — COMPREHENSIVE METABOLIC PANEL
ALT: 145 U/L — ABNORMAL HIGH (ref 0–53)
AST: 44 U/L — ABNORMAL HIGH (ref 0–37)
Albumin: 2.3 g/dL — ABNORMAL LOW (ref 3.5–5.2)
Alkaline Phosphatase: 122 U/L — ABNORMAL HIGH (ref 39–117)
Anion gap: 11 (ref 5–15)
BUN: 29 mg/dL — ABNORMAL HIGH (ref 6–23)
CO2: 29 mEq/L (ref 19–32)
Calcium: 8.6 mg/dL (ref 8.4–10.5)
Chloride: 102 mEq/L (ref 96–112)
Creatinine, Ser: 0.91 mg/dL (ref 0.50–1.35)
GFR calc Af Amer: >90 mL/min (ref >90)
GFR calc non Af Amer: 88 mL/min — ABNORMAL LOW (ref >90)
Glucose, Bld: 136 mg/dL — ABNORMAL HIGH (ref 70–99)
Potassium: 4.8 mEq/L (ref 3.7–5.3)
Sodium: 142 mEq/L (ref 137–147)
Total Bilirubin: 0.3 mg/dL (ref 0.3–1.2)
Total Protein: 6.4 g/dL (ref 6.0–8.3)

## 2013-10-05 LAB — VANCOMYCIN, TROUGH: Vancomycin Tr: 17.7 ug/mL (ref 10.0–20.0)

## 2013-10-05 LAB — MAGNESIUM: Magnesium: 2.4 mg/dL (ref 1.5–2.5)

## 2013-10-05 LAB — SEDIMENTATION RATE: Sed Rate: 65 mm/hr — ABNORMAL HIGH (ref 0–16)

## 2013-10-05 MED ORDER — SENNA 8.6 MG PO TABS
1.0000 | ORAL_TABLET | Freq: Every day | ORAL | Status: DC
  Administered 2013-10-06 – 2013-10-20 (×8): 8.6 mg via ORAL
  Filled 2013-10-05 (×15): qty 1

## 2013-10-05 MED ORDER — POLYETHYLENE GLYCOL 3350 17 G PO PACK
17.0000 g | PACK | Freq: Three times a day (TID) | ORAL | Status: DC
  Administered 2013-10-05 – 2013-11-08 (×31): 17 g via ORAL
  Filled 2013-10-05 (×82): qty 1

## 2013-10-05 NOTE — Progress Notes (Signed)
ANTIBIOTIC CONSULT NOTE  Pharmacy Consult for Vancomycin, Meropenem Indication: pneumonia  Allergies  Allergen Reactions  . Flexeril [Cyclobenzaprine Hcl] Hives  . Other Rash    Raw tobacco   Patient Measurements: Height:  (180.3 cm) Weight: 266 lb 15.6 oz (121.1 kg) IBW/kg (Calculated) : 75.3  Vital Signs: Temp: 97 F (36.1 C) (09/07 0400) Temp src: Axillary (09/07 0400) BP: 126/80 mmHg (09/07 0700) Pulse Rate: 79 (09/07 0700) Intake/Output from previous day: 09/06 0701 - 09/07 0700 In: 2030 [P.O.:540; I.V.:40; IV Piggyback:1450] Out: 2200 [Urine:2200] Intake/Output from this shift:    Labs:  Recent Labs  10/03/13 0433 10/05/13 0439  WBC 20.8* 24.8*  HGB 10.8* 11.0*  PLT 266 264  CREATININE 0.99 0.91   Estimated Creatinine Clearance: 108.6 ml/min (by C-G formula based on Cr of 0.91).  Recent Labs  10/03/13 0903 10/05/13 0840  VANCOTROUGH 11.1 17.7     Microbiology: Recent Results (from the past 720 hour(s))  MRSA PCR SCREENING     Status: None   Collection Time    09/29/13  1:45 AM      Result Value Ref Range Status   MRSA by PCR NEGATIVE  NEGATIVE Final   Comment:            The GeneXpert MRSA Assay (FDA     approved for NASAL specimens     only), is one component of a     comprehensive MRSA colonization     surveillance program. It is not     intended to diagnose MRSA     infection nor to guide or     monitor treatment for     MRSA infections.  CULTURE, BLOOD (ROUTINE X 2)     Status: None   Collection Time    09/29/13  4:36 AM      Result Value Ref Range Status   Specimen Description BLOOD LEFT ANTECUBITAL   Final   Special Requests BOTTLES DRAWN AEROBIC AND ANAEROBIC 10CC   Final   Culture NO GROWTH 5 DAYS   Final   Report Status 10/04/2013 FINAL   Final  CULTURE, BLOOD (ROUTINE X 2)     Status: None   Collection Time    09/29/13  4:36 AM      Result Value Ref Range Status   Specimen Description BLOOD LEFT HAND   Final   Special Requests BOTTLES DRAWN AEROBIC AND ANAEROBIC 8CC   Final   Culture NO GROWTH 5 DAYS   Final   Report Status 10/04/2013 FINAL   Final  CULTURE, RESPIRATORY (NON-EXPECTORATED)     Status: None   Collection Time    10/03/13  8:45 PM      Result Value Ref Range Status   Specimen Description THROAT   Final   Special Requests NONE   Final   Gram Stain PENDING   Incomplete   Culture     Final   Value: NO GROWTH     Performed at [facility]   Report Status PENDING   Incomplete   Medical History: Past Medical History  Diagnosis Date  . Sciatica   . Bulging discs     neck  . Torn rotator cuff   . PTSD (post-traumatic stress disorder)   . Depression   . Anxiety   . Hypertension   . Sleep apnea     supposed to wear CPAP but doesn't  . Syncopal episodes     pt states "it happens after i try  to get up after smoking a cigarette"   Anti-infectives   Start     Dose/Rate Route Frequency Ordered Stop   10/03/13 2200  vancomycin (VANCOCIN) 1,500 mg in sodium chloride 0.9 % 500 mL IVPB     1,500 mg 250 mL/hr over 120 Minutes Intravenous Every 12 hours 10/03/13 1012     10/02/13 1400  meropenem (MERREM) 1 g in sodium chloride 0.9 % 100 mL IVPB     1 g 200 mL/hr over 30 Minutes Intravenous 3 times per day 10/02/13 0918     10/01/13 2200  azithromycin (ZITHROMAX) 500 mg in dextrose 5 % 250 mL IVPB     500 mg 250 mL/hr over 60 Minutes Intravenous Every 24 hours 10/01/13 1153     09/30/13 2200  vancomycin (VANCOCIN) 1,250 mg in sodium chloride 0.9 % 250 mL IVPB  Status:  Discontinued     1,250 mg 166.7 mL/hr over 90 Minutes Intravenous Every 12 hours 09/30/13 1020 10/03/13 1012   09/30/13 1000  ceFEPIme (MAXIPIME) 1 g in dextrose 5 % 50 mL IVPB  Status:  Discontinued     1 g 100 mL/hr over 30 Minutes Intravenous Every 8 hours 09/30/13 0851 10/02/13 0906   09/30/13 1000  vancomycin (VANCOCIN) 2,000 mg in sodium chloride 0.9 % 500 mL IVPB     2,000 mg 250 mL/hr over 120 Minutes  Intravenous  Once 09/30/13 0902 09/30/13 1244   09/29/13 2200  azithromycin (ZITHROMAX) 500 mg in dextrose 5 % 250 mL IVPB  Status:  Discontinued     500 mg 250 mL/hr over 60 Minutes Intravenous Every 24 hours 09/29/13 0202 10/01/13 0848   09/29/13 2100  cefTRIAXone (ROCEPHIN) 1 g in dextrose 5 % 50 mL IVPB  Status:  Discontinued     1 g 100 mL/hr over 30 Minutes Intravenous Every 24 hours 09/29/13 0202 09/30/13 0851   09/29/13 0000  azithromycin (ZITHROMAX) 500 mg in dextrose 5 % 250 mL IVPB     500 mg 250 mL/hr over 60 Minutes Intravenous  Once 09/28/13 2348 09/29/13 0212   09/29/13 0000  cefTRIAXone (ROCEPHIN) 1 g in dextrose 5 % 50 mL IVPB     1 g 100 mL/hr over 30 Minutes Intravenous  Once 09/28/13 2348 09/29/13 0100      Assessment: 63yo male with acute hypoxic respiratory failure secondary to pneumonia.  Pt failed OP Levaquin.  Cefepime was changed to Meropenem for better anaerobic coverage.  SCr stable. Patient clinically improved, with steroid and antibiotics. Vancomycin trough at goal  Meropenem 9/4>> Vancomycin 9/2>> Cefepime 9/2>>9/4 Zithromax 9/1>> Rocephin 9/1>>9/2  Goal of Therapy:  Vancomycin trough level 15-20 mcg/ml Eradicate infection.  Plan:   Continue Meropenem 1gm IV q8h  Continue Vancomycin 1500 mg IV q12hrs  Continue Zithromax per MD for atypical coverage  Check Vancomycin trough level at steady state  Monitor labs, renal fxn, and cultures  Max [name], Max [name] 10/05/2013,9:57 AM

## 2013-10-05 NOTE — Progress Notes (Signed)
PROGRESS NOTE  Max [name] SAY:[id number] DOB: 08-08-1950 DOA: 09/28/2013 PCP: [name], MD  Summary: 63 year old man with history of obstructive sleep apnea, asbestos exposure presented with increasing shortness of breath and was admitted for acute hypoxic respiratory failure and pneumonia. Oxygen requirement increased and antibiotics were broadened. CT chest revealed diffuse bilateral airspace disease. Other laboratory workup has been unrevealing, patient followed by pulmonology, plan to continue empiric antibiotics and steroids at this time for suspected inflammatory component. His condition remains tenuous but she has somewhat improved. If his condition declines may need bronchoscopy.  Assessment/Plan: 1. Acute hypoxic respiratory failure secondary to pneumonia. Perhaps slightly better today. 2. Healthcare associated pneumonia. Specific etiology remains unclear but he seems to be responding to steroids suggesting an inflammatory component. Vasculitis labs are pending. Consider viral pneumonia. May need bronchoscopy it is not improving or the next several days. 3. Acute kidney injury. Resolved. Secondary to poor oral intake, Naprosyn. Also lisinopril, hydrochlorothiazide.  4. Dysphagia. History of esophageal stricture requiring dilatation. Followed by gastroenterology in [place] but requests f/u here in [place]. CT showed thickening of distal esophageal wall. Outpatient followup recommended. 5. PTSD, depression, anxiety. Stable. He has remained appropriate with physician. May have had mild component of encephalopathy from steroids. 6. OSA, CPAP 7. Tobacco dependence in remission   Oxygen requirement remained stable although he quickly desaturates with any movement. Discussed with pulmonology this morning. Plan to continue current care including antibiotics and steroids. Consider bronchoscopy if fails to improve further.  Increase Miralax, schedule Senna.   Code Status: full  code DVT prophylaxis: Lovenox Family Communication:  Disposition Plan: home  Murray Hodgkins, MD  [facility]  Pager [phone] If 7PM-7AM, please contact night-coverage at [email or web address], password [facility] 10/05/2013, 7:44 AM  LOS: 7 days   Consultants:  Pulmonology  Procedures:    Antibiotics:  Zithromax 9/1 >>   Vancomycin 9/2 >>  Merrem 9/4 >>  Cefepime 9/2 >> 9/4  Ceftriaxone 9/1  HPI/Subjective: Frequently requesting pain medications overnight, eating without difficulty but requesting nursing to perform simple functions. Poor compliance with BiPAP and masked oxygenation. Nursing is concern for rapid mood swings.  He is feeling somewhat better today. He reports he tolerated BiPAP last night for the first time and this definitely helped him. His breathing is a little bit better today. He continues to have some sciatica but this is stable.  Objective: Filed Vitals:   10/05/13 0411 10/05/13 0500 10/05/13 0600 10/05/13 0703  BP:  141/89 128/80   Pulse: 87 73 80   Temp:      TempSrc:      Resp: 27 21 24    Height:      Weight:  121.1 kg (266 lb 15.6 oz)    SpO2: 96% 97% 89% 89%    Intake/Output Summary (Last 24 hours) at 10/05/13 0744 Last data filed at 10/05/13 0200  Gross per 24 hour  Intake   2030 ml  Output   2200 ml  Net   -170 ml     Filed Weights   10/03/13 0600 10/04/13 0500 10/05/13 0500  Weight: 118.4 kg (261 lb 0.4 oz) 119.7 kg (263 lb 14.3 oz) 121.1 kg (266 lb 15.6 oz)    Exam:     Afebrile, vital signs stable. Stable hypoxia on nonrebreather.  Gen. He appears calm, mildly uncomfortable, ill but not toxic. Perhaps a little bit better today.  Respiratory. Clear to auscultation with fairly good air movement, no frank wheezes rales or rhonchi.  Cardiovascular regular  rate and rhythm. No murmur, rub or gallop. No lower extremity edema. Telemetry sinus rhythm.  Musculoskeletal. Most likes well.  Psychiatric. Grossly normal mood and  affect. Speech fluent and appropriate.  Lips and tongue appear unremarkable. Eyes appear grossly unremarkable.  Data Reviewed:  Excellent urine output  Basic metabolic panel unremarkable. Elevation of AST, ALT of unclear etiology.  Total CK normal. WBC 24.8, difficult to interpret on high-dose steroids. ESR 65.   Blood cultures no growth, final. Sputum culture pending.  Scheduled Meds: . antiseptic oral rinse  7 mL Mouth Rinse q12n4p  . azithromycin  500 mg Intravenous Q24H  . chlorhexidine  15 mL Mouth Rinse BID  . clonazePAM  1 mg Oral Daily  . enoxaparin (LOVENOX) injection  60 mg Subcutaneous Q24H  . escitalopram  20 mg Oral Daily  . gabapentin  600 mg Oral TID  . guaiFENesin  1,200 mg Oral BID  . ipratropium-albuterol  3 mL Nebulization Q6H WA  . meropenem (MERREM) IV  1 g Intravenous 3 times per day  . methylPREDNISolone (SOLU-MEDROL) injection  60 mg Intravenous Q6H  . pantoprazole  40 mg Oral BID  . polyethylene glycol  17 g Oral BID  . QUEtiapine  100 mg Oral QHS  . sodium chloride  10-40 mL Intracatheter Q12H  . vancomycin  1,500 mg Intravenous Q12H  . zolpidem  10 mg Oral QHS   Continuous Infusions:    Principal Problem:   Acute respiratory failure with hypoxia Active Problems:   HCAP (healthcare-associated pneumonia)   Dysphagia, unspecified(787.20)   Acute kidney injury   GERD (gastroesophageal reflux disease)   OSA on CPAP   Time spent 25  minutes

## 2013-10-05 NOTE — Clinical Social Work Note (Signed)
CSW received consult for pain management after d/c. Discussed with MD who does not feel this is appropriate referral at this time. CSW will sign off, but can be reconsulted if needed.  Derenda Fennel, [place] [phone]

## 2013-10-05 NOTE — Progress Notes (Signed)
Subjective: He is awake and alert but still having problems with desaturation when he moves around. Still requiring a nonrebreather mask. He says his pain is not as well controlled but he understands he can't take a lot of pain medication with his respiratory situation  Objective: Vital signs in last 24 hours: Temp:  [97 F (36.1 C)-98.5 F (36.9 C)] 97 F (36.1 C) (09/07 0400) Pulse Rate:  [73-117] 79 (09/07 0700) Resp:  [14-38] 20 (09/07 0700) BP: (77-141)/(58-89) 126/80 mmHg (09/07 0700) SpO2:  [78 %-97 %] 89 % (09/07 0703) FiO2 (%):  [100 %] 100 % (09/07 0703) Weight:  [121.1 kg (266 lb 15.6 oz)] 121.1 kg (266 lb 15.6 oz) (09/07 0500) Weight change: 1.4 kg (3 lb 1.4 oz) Last BM Date: 09/30/13  Intake/Output from previous day: 09/06 0701 - 09/07 0700 In: 2030 [P.O.:540; I.V.:40; IV Piggyback:1450] Out: 2200 [Urine:2200]  PHYSICAL EXAM General appearance: alert, cooperative and moderate distress Resp: rhonchi bilaterally Cardio: regular rate and rhythm, S1, S2 normal, no murmur, click, rub or gallop GI: soft, non-tender; bowel sounds normal; no masses,  no organomegaly Extremities: extremities normal, atraumatic, no cyanosis or edema  Lab Results:  Results for orders placed during the hospital encounter of 09/28/13 (from the past 48 hour(s))  CULTURE, RESPIRATORY (NON-EXPECTORATED)     Status: None   Collection Time    10/03/13  8:45 PM      Result Value Ref Range   Specimen Description THROAT     Special Requests NONE     Gram Stain PENDING     Culture       Value: NO GROWTH     Performed at [facility]   Report Status PENDING    BLOOD GAS, ARTERIAL     Status: Abnormal   Collection Time    10/04/13  3:53 PM      Result Value Ref Range   O2 Content 100.0     Delivery systems NON-REBREATHER OXYGEN MASK     pH, Arterial 7.370  7.350 - 7.450   pCO2 arterial 47.6 (*) 35.0 - 45.0 mmHg   pO2, Arterial 64.2 (*) 80.0 - 100.0 mmHg   Bicarbonate 26.8 (*) 20.0 -  24.0 mEq/L   TCO2 24.7  0 - 100 mmol/L   Acid-Base Excess 2.1 (*) 0.0 - 2.0 mmol/L   O2 Saturation 91.2     Patient temperature 37.0     Collection site RIGHT RADIAL     Drawn by [phone]     Sample type ARTERIAL     Allens test (pass/fail) PASS  PASS  CBC     Status: Abnormal   Collection Time    10/05/13  4:39 AM      Result Value Ref Range   WBC 24.8 (*) 4.0 - 10.5 K/uL   RBC 3.31 (*) 4.22 - 5.81 MIL/uL   Hemoglobin 11.0 (*) 13.0 - 17.0 g/dL   HCT 33.3 (*) 39.0 - 52.0 %   MCV 100.6 (*) 78.0 - 100.0 fL   MCH 33.2  26.0 - 34.0 pg   MCHC 33.0  30.0 - 36.0 g/dL   RDW 12.6  11.5 - 15.5 %   Platelets 264  150 - 400 K/uL  MAGNESIUM     Status: None   Collection Time    10/05/13  4:39 AM      Result Value Ref Range   Magnesium 2.4  1.5 - 2.5 mg/dL  CK     Status: None  Collection Time    10/05/13  4:39 AM      Result Value Ref Range   Total CK 28  7 - 232 U/L  COMPREHENSIVE METABOLIC PANEL     Status: Abnormal   Collection Time    10/05/13  4:39 AM      Result Value Ref Range   Sodium 142  137 - 147 mEq/L   Potassium 4.8  3.7 - 5.3 mEq/L   Chloride 102  96 - 112 mEq/L   CO2 29  19 - 32 mEq/L   Glucose, Bld 136 (*) 70 - 99 mg/dL   BUN 29 (*) 6 - 23 mg/dL   Creatinine, Ser 0.91  0.50 - 1.35 mg/dL   Calcium 8.6  8.4 - 10.5 mg/dL   Total Protein 6.4  6.0 - 8.3 g/dL   Albumin 2.3 (*) 3.5 - 5.2 g/dL   AST 44 (*) 0 - 37 U/L   ALT 145 (*) 0 - 53 U/L   Alkaline Phosphatase 122 (*) 39 - 117 U/L   Total Bilirubin 0.3  0.3 - 1.2 mg/dL   GFR calc non Af Amer 88 (*) >90 mL/min   GFR calc Af Amer >90  >90 mL/min   Comment: (NOTE)     The eGFR has been calculated using the CKD EPI equation.     This calculation has not been validated in all clinical situations.     eGFR's persistently <90 mL/min signify possible Chronic Kidney     Disease.   Anion gap 11  5 - 15  SEDIMENTATION RATE     Status: Abnormal   Collection Time    10/05/13  4:39 AM      Result Value Ref Range   Sed  Rate 65 (*) 0 - 16 mm/hr    ABGS  Recent Labs  10/04/13 1553  PHART 7.370  PO2ART 64.2*  TCO2 24.7  HCO3 26.8*   CULTURES Recent Results (from the past 240 hour(s))  MRSA PCR SCREENING     Status: None   Collection Time    09/29/13  1:45 AM      Result Value Ref Range Status   MRSA by PCR NEGATIVE  NEGATIVE Final   Comment:            The GeneXpert MRSA Assay (FDA     approved for NASAL specimens     only), is one component of a     comprehensive MRSA colonization     surveillance program. It is not     intended to diagnose MRSA     infection nor to guide or     monitor treatment for     MRSA infections.  CULTURE, BLOOD (ROUTINE X 2)     Status: None   Collection Time    09/29/13  4:36 AM      Result Value Ref Range Status   Specimen Description BLOOD LEFT ANTECUBITAL   Final   Special Requests BOTTLES DRAWN AEROBIC AND ANAEROBIC 10CC   Final   Culture NO GROWTH 5 DAYS   Final   Report Status 10/04/2013 FINAL   Final  CULTURE, BLOOD (ROUTINE X 2)     Status: None   Collection Time    09/29/13  4:36 AM      Result Value Ref Range Status   Specimen Description BLOOD LEFT HAND   Final   Special Requests BOTTLES DRAWN AEROBIC AND ANAEROBIC 8CC   Final   Culture NO GROWTH 5  DAYS   Final   Report Status 10/04/2013 FINAL   Final  CULTURE, RESPIRATORY (NON-EXPECTORATED)     Status: None   Collection Time    10/03/13  8:45 PM      Result Value Ref Range Status   Specimen Description THROAT   Final   Special Requests NONE   Final   Gram Stain PENDING   Incomplete   Culture     Final   Value: NO GROWTH     Performed at [facility]   Report Status PENDING   Incomplete   Studies/Results: No results found.  Medications:  Prior to Admission:  Prescriptions prior to admission  Medication Sig Dispense Refill  . albuterol (PROVENTIL HFA;VENTOLIN HFA) 108 (90 BASE) MCG/ACT inhaler Inhale 2 puffs into the lungs every 4 (four) hours as needed for wheezing or  shortness of breath.  1 Inhaler  3  . clonazePAM (KLONOPIN) 1 MG tablet Take 1 mg by mouth daily.      . diazepam (VALIUM) 5 MG tablet Take 1 tablet (5 mg total) by mouth every 6 (six) hours as needed for muscle spasms.  10 tablet  0  . escitalopram (LEXAPRO) 20 MG tablet Take 20 mg by mouth daily.        Marland Kitchen gabapentin (NEURONTIN) 600 MG tablet Take 1,200 mg by mouth 3 (three) times daily.      Marland Kitchen ibuprofen (ADVIL,MOTRIN) 800 MG tablet Take 800 mg by mouth 2 (two) times daily as needed for moderate pain.      Marland Kitchen lisinopril-hydrochlorothiazide (PRINZIDE,ZESTORETIC) 20-12.5 MG per tablet Take 1 tablet by mouth daily.      . naproxen (NAPROSYN) 500 MG tablet Take 1 tablet (500 mg total) by mouth 2 (two) times daily with a meal.  30 tablet  0  . QUEtiapine (SEROQUEL) 100 MG tablet Take 100 mg by mouth at bedtime.      Marland Kitchen zolpidem (AMBIEN) 10 MG tablet Take 10 mg by mouth at bedtime.      Marland Kitchen levofloxacin (LEVAQUIN) 750 MG tablet Take 1 tablet (750 mg total) by mouth daily.  7 tablet  0  . oxyCODONE-acetaminophen (PERCOCET) 5-325 MG per tablet Take 1 tablet by mouth every 4 (four) hours as needed.  20 tablet  0   Scheduled: . antiseptic oral rinse  7 mL Mouth Rinse q12n4p  . azithromycin  500 mg Intravenous Q24H  . chlorhexidine  15 mL Mouth Rinse BID  . clonazePAM  1 mg Oral Daily  . enoxaparin (LOVENOX) injection  60 mg Subcutaneous Q24H  . escitalopram  20 mg Oral Daily  . gabapentin  600 mg Oral TID  . guaiFENesin  1,200 mg Oral BID  . ipratropium-albuterol  3 mL Nebulization Q6H WA  . meropenem (MERREM) IV  1 g Intravenous 3 times per day  . methylPREDNISolone (SOLU-MEDROL) injection  60 mg Intravenous Q6H  . pantoprazole  40 mg Oral BID  . polyethylene glycol  17 g Oral BID  . QUEtiapine  100 mg Oral QHS  . sodium chloride  10-40 mL Intracatheter Q12H  . vancomycin  1,500 mg Intravenous Q12H  . zolpidem  10 mg Oral QHS   Continuous:  [id number], diazepam, HYDROmorphone (DILAUDID)  injection, ondansetron (ZOFRAN) IV, ondansetron, oxyCODONE-acetaminophen, senna, sodium chloride  Assesment: He has acute hypoxic respiratory failure. He has healthcare associated pneumonia. He has significant problems with GERD but did not appear to have aspirated. He has obstructive sleep apnea and is on CPAP. He seems  to be slowly improving. He was able to tolerate BiPAP last night and said that made him feel better. Principal Problem:   Acute respiratory failure with hypoxia Active Problems:   HCAP (healthcare-associated pneumonia)   Dysphagia, unspecified(787.20)   Acute kidney injury   GERD (gastroesophageal reflux disease)   OSA on CPAP    Plan: Chest x-ray tomorrow.  discussed bronchoscopy with the patient and with Dr. Sarajane Jews but I think it's too dangerous to attempt that now.    LOS: 7 days   Kriss Perleberg L 10/05/2013, 9:16 AM

## 2013-10-05 NOTE — Progress Notes (Signed)
Patient's content of his speech not matching with the situations throughout the night.  Patient will start off conversations either calm and place comments like "give me a hug and if you don't I will hit you" or start off anger from talking on the phone state a few curse words then the next statement state "I really like you, that person is sweet." The patient is demonstrating rapid mood swings at times. He demonstrating a decrease in ADL participation by asking for simple task of pouring his own drink, wiping crumbs of his shirt, moving his pillows and adjusting a towel over them. In contrast to using his urinal to void, placing phone calls and eating reheated outside food without any assistance.  The patient still requesting pain medications earlier than the every four hours as needed order.  He states "you can give it to me early."  Patient given teach back on the as needed schedule of his pain medications and that if he is requesting it every fours that it can't be given early.  Patient still making comments about pushing his medications fast through his IV.  He states "you are supposed to push Demerol fast so you need to push my pain medications fast."  He has stated that "[facility] has the right equipment and knows how to give me my pain drugs."  The patient while wearing his BiPAP wants to be taken off to eat and drink throughout the night.The patient was given anxiety/pain medication, drinks, snacks and mouth care prior to placement on the BiPAP.  Patient was taking his non-rebreather mask off throughout the shift talking on the phone setting off alarms continuously.  Teach back was given on the dangers of letting his Oxygen stats drop into the 60's to 70's due to his non compliance and a result of possibly needing to be placed on a ventilator.

## 2013-10-05 NOTE — Progress Notes (Signed)
Pt was given new BiPAP mask trying to get him to wear BiPAP. HE did do better but does not sleep. Off BiPAP at 0553 am. Back on NRB. SaO2 still up and down quickly.

## 2013-10-06 ENCOUNTER — Inpatient Hospital Stay (HOSPITAL_COMMUNITY): Payer: Medicaid Other

## 2013-10-06 LAB — CBC
HCT: 33.6 % — ABNORMAL LOW (ref 39.0–52.0)
Hemoglobin: 11.1 g/dL — ABNORMAL LOW (ref 13.0–17.0)
MCH: 32.3 pg (ref 26.0–34.0)
MCHC: 33 g/dL (ref 30.0–36.0)
MCV: 97.7 fL (ref 78.0–100.0)
Platelets: 233 10*3/uL (ref 150–400)
RBC: 3.44 MIL/uL — ABNORMAL LOW (ref 4.22–5.81)
RDW: 13.2 % (ref 11.5–15.5)
WBC: 25.3 10*3/uL — ABNORMAL HIGH (ref 4.0–10.5)

## 2013-10-06 LAB — CULTURE, RESPIRATORY W GRAM STAIN: Gram Stain: NONE SEEN

## 2013-10-06 LAB — ANCA SCREEN W REFLEX TITER
Atypical p-ANCA Screen: NEGATIVE
c-ANCA Screen: NEGATIVE
p-ANCA Screen: NEGATIVE

## 2013-10-06 LAB — BASIC METABOLIC PANEL
Anion gap: 10 (ref 5–15)
BUN: 27 mg/dL — ABNORMAL HIGH (ref 6–23)
CO2: 31 mEq/L (ref 19–32)
Calcium: 8.5 mg/dL (ref 8.4–10.5)
Chloride: 101 mEq/L (ref 96–112)
Creatinine, Ser: 0.85 mg/dL (ref 0.50–1.35)
GFR calc Af Amer: >90 mL/min (ref >90)
GFR calc non Af Amer: >90 mL/min (ref >90)
Glucose, Bld: 123 mg/dL — ABNORMAL HIGH (ref 70–99)
Potassium: 4.5 mEq/L (ref 3.7–5.3)
Sodium: 142 mEq/L (ref 137–147)

## 2013-10-06 LAB — ANA: Anti Nuclear Antibody(ANA): NEGATIVE

## 2013-10-06 MED ORDER — HYDROMORPHONE HCL PF 1 MG/ML IJ SOLN
1.0000 mg | INTRAMUSCULAR | Status: DC | PRN
  Administered 2013-10-06 – 2013-10-08 (×10): 1 mg via INTRAVENOUS
  Filled 2013-10-06 (×10): qty 1

## 2013-10-06 MED ORDER — METHOCARBAMOL 500 MG PO TABS: 500.0000 mg | ORAL_TABLET | Freq: Four times a day (QID) | ORAL | Status: DC | PRN

## 2013-10-06 MED ORDER — AZITHROMYCIN 250 MG PO TABS
500.0000 mg | ORAL_TABLET | Freq: Every day | ORAL | Status: DC
Start: 2013-10-06 — End: 2013-10-09
  Administered 2013-10-06 – 2013-10-08 (×3): 500 mg via ORAL
  Filled 2013-10-06 (×3): qty 2

## 2013-10-06 MED ORDER — SUCRALFATE 1 GM/10ML PO SUSP
1.0000 g | Freq: Three times a day (TID) | ORAL | Status: DC
  Administered 2013-10-06 – 2013-10-29 (×81): 1 g via ORAL
  Filled 2013-10-06 (×97): qty 10

## 2013-10-06 NOTE — Progress Notes (Signed)
Pt has no obvious or stated distress overnight but continues to desaturate quickly

## 2013-10-06 NOTE — Progress Notes (Signed)
Subjective: Max [name] had an episode of confusion last night Max [name] thinks may have been related to Seroquel. Max [name] is still concerned that his pain is not as well controlled as Max [name] would like but I explained to him again this was his respiratory status we do not want to give him a great deal of pain medication. Max [name] says Max [name] feels like his breathing is doing a little bit better.  Objective: Vital signs in last 24 hours: Temp:  [97.6 F (36.4 C)-97.8 F (36.6 C)] 97.6 F (36.4 C) (09/08 0758) Pulse Rate:  [44-138] 66 (09/08 0700) Resp:  [14-31] 15 (09/08 0700) BP: (98-134)/(14-92) 117/72 mmHg (09/08 0700) SpO2:  [59 %-99 %] 90 % (09/08 0811) FiO2 (%):  [100 %] 100 % (09/08 0811) Weight change:  Last BM Date: 10/05/13  Intake/Output from previous day: 09/07 0701 - 09/08 0700 In: 600 [IV Piggyback:600] Out: 900 [Urine:900]  PHYSICAL EXAM General appearance: alert, cooperative and mild distress Resp: rhonchi bilaterally Cardio: regular rate and rhythm, S1, S2 normal, no murmur, click, rub or gallop GI: soft, non-tender; bowel sounds normal; no masses,  no organomegaly Extremities: extremities normal, atraumatic, no cyanosis or edema  Lab Results:  Results for orders placed during the hospital encounter of 09/28/13 (from the past 48 hour(s))  BLOOD GAS, ARTERIAL     Status: Abnormal   Collection Time    10/04/13  3:53 PM      Result Value Ref Range   O2 Content 100.0     Delivery systems NON-REBREATHER OXYGEN MASK     pH, Arterial 7.370  7.350 - 7.450   pCO2 arterial 47.6 (*) 35.0 - 45.0 mmHg   pO2, Arterial 64.2 (*) 80.0 - 100.0 mmHg   Bicarbonate 26.8 (*) 20.0 - 24.0 mEq/L   TCO2 24.7  0 - 100 mmol/L   Acid-Base Excess 2.1 (*) 0.0 - 2.0 mmol/L   O2 Saturation 91.2     Patient temperature 37.0     Collection site RIGHT RADIAL     Drawn by [phone]     Sample type ARTERIAL     Allens test (pass/fail) PASS  PASS  CBC     Status: Abnormal   Collection Time    10/05/13  4:39 AM      Result  Value Ref Range   WBC 24.8 (*) 4.0 - 10.5 K/uL   RBC 3.31 (*) 4.22 - 5.81 MIL/uL   Hemoglobin 11.0 (*) 13.0 - 17.0 g/dL   HCT 33.3 (*) 39.0 - 52.0 %   MCV 100.6 (*) 78.0 - 100.0 fL   MCH 33.2  26.0 - 34.0 pg   MCHC 33.0  30.0 - 36.0 g/dL   RDW 12.6  11.5 - 15.5 %   Platelets 264  150 - 400 K/uL  MAGNESIUM     Status: None   Collection Time    10/05/13  4:39 AM      Result Value Ref Range   Magnesium 2.4  1.5 - 2.5 mg/dL  CK     Status: None   Collection Time    10/05/13  4:39 AM      Result Value Ref Range   Total CK 28  7 - 232 U/L  COMPREHENSIVE METABOLIC PANEL     Status: Abnormal   Collection Time    10/05/13  4:39 AM      Result Value Ref Range   Sodium 142  137 - 147 mEq/L   Potassium 4.8  3.7 - 5.3 mEq/L  Chloride 102  96 - 112 mEq/L   CO2 29  19 - 32 mEq/L   Glucose, Bld 136 (*) 70 - 99 mg/dL   BUN 29 (*) 6 - 23 mg/dL   Creatinine, Ser 0.91  0.50 - 1.35 mg/dL   Calcium 8.6  8.4 - 10.5 mg/dL   Total Protein 6.4  6.0 - 8.3 g/dL   Albumin 2.3 (*) 3.5 - 5.2 g/dL   AST 44 (*) 0 - 37 U/L   ALT 145 (*) 0 - 53 U/L   Alkaline Phosphatase 122 (*) 39 - 117 U/L   Total Bilirubin 0.3  0.3 - 1.2 mg/dL   GFR calc non Af Amer 88 (*) >90 mL/min   GFR calc Af Amer >90  >90 mL/min   Comment: (NOTE)     The eGFR has been calculated using the CKD EPI equation.     This calculation has not been validated in all clinical situations.     eGFR's persistently <90 mL/min signify possible Chronic Kidney     Disease.   Anion gap 11  5 - 15  SEDIMENTATION RATE     Status: Abnormal   Collection Time    10/05/13  4:39 AM      Result Value Ref Range   Sed Rate 65 (*) 0 - 16 mm/hr  VANCOMYCIN, TROUGH     Status: None   Collection Time    10/05/13  8:40 AM      Result Value Ref Range   Vancomycin Tr 17.7  10.0 - 20.0 ug/mL  BASIC METABOLIC PANEL     Status: Abnormal   Collection Time    10/06/13  4:04 AM      Result Value Ref Range   Sodium 142  137 - 147 mEq/L   Potassium 4.5  3.7  - 5.3 mEq/L   Chloride 101  96 - 112 mEq/L   CO2 31  19 - 32 mEq/L   Glucose, Bld 123 (*) 70 - 99 mg/dL   BUN 27 (*) 6 - 23 mg/dL   Creatinine, Ser 0.85  0.50 - 1.35 mg/dL   Calcium 8.5  8.4 - 10.5 mg/dL   GFR calc non Af Amer >90  >90 mL/min   GFR calc Af Amer >90  >90 mL/min   Comment: (NOTE)     The eGFR has been calculated using the CKD EPI equation.     This calculation has not been validated in all clinical situations.     eGFR's persistently <90 mL/min signify possible Chronic Kidney     Disease.   Anion gap 10  5 - 15  CBC     Status: Abnormal   Collection Time    10/06/13  4:04 AM      Result Value Ref Range   WBC 25.3 (*) 4.0 - 10.5 K/uL   RBC 3.44 (*) 4.22 - 5.81 MIL/uL   Hemoglobin 11.1 (*) 13.0 - 17.0 g/dL   HCT 33.6 (*) 39.0 - 52.0 %   MCV 97.7  78.0 - 100.0 fL   MCH 32.3  26.0 - 34.0 pg   MCHC 33.0  30.0 - 36.0 g/dL   RDW 13.2  11.5 - 15.5 %   Platelets 233  150 - 400 K/uL    ABGS  Recent Labs  10/04/13 1553  PHART 7.370  PO2ART 64.2*  TCO2 24.7  HCO3 26.8*   CULTURES Recent Results (from the past 240 hour(s))  MRSA PCR SCREENING  Status: None   Collection Time    09/29/13  1:45 AM      Result Value Ref Range Status   MRSA by PCR NEGATIVE  NEGATIVE Final   Comment:            The GeneXpert MRSA Assay (FDA     approved for NASAL specimens     only), is one component of a     comprehensive MRSA colonization     surveillance program. It is not     intended to diagnose MRSA     infection nor to guide or     monitor treatment for     MRSA infections.  CULTURE, BLOOD (ROUTINE X 2)     Status: None   Collection Time    09/29/13  4:36 AM      Result Value Ref Range Status   Specimen Description BLOOD LEFT ANTECUBITAL   Final   Special Requests BOTTLES DRAWN AEROBIC AND ANAEROBIC 10CC   Final   Culture NO GROWTH 5 DAYS   Final   Report Status 10/04/2013 FINAL   Final  CULTURE, BLOOD (ROUTINE X 2)     Status: None   Collection Time     09/29/13  4:36 AM      Result Value Ref Range Status   Specimen Description BLOOD LEFT HAND   Final   Special Requests BOTTLES DRAWN AEROBIC AND ANAEROBIC 8CC   Final   Culture NO GROWTH 5 DAYS   Final   Report Status 10/04/2013 FINAL   Final  CULTURE, RESPIRATORY (NON-EXPECTORATED)     Status: None   Collection Time    10/03/13  8:45 PM      Result Value Ref Range Status   Specimen Description THROAT   Final   Special Requests NONE   Final   Gram Stain PENDING   Incomplete   Culture     Final   Value: NO GROWTH     Performed at [facility]   Report Status PENDING   Incomplete   Studies/Results: No results found.  Medications:  Prior to Admission:  Prescriptions prior to admission  Medication Sig Dispense Refill  . albuterol (PROVENTIL HFA;VENTOLIN HFA) 108 (90 BASE) MCG/ACT inhaler Inhale 2 puffs into the lungs every 4 (four) hours as needed for wheezing or shortness of breath.  1 Inhaler  3  . clonazePAM (KLONOPIN) 1 MG tablet Take 1 mg by mouth daily.      . diazepam (VALIUM) 5 MG tablet Take 1 tablet (5 mg total) by mouth every 6 (six) hours as needed for muscle spasms.  10 tablet  0  . escitalopram (LEXAPRO) 20 MG tablet Take 20 mg by mouth daily.        Marland Kitchen gabapentin (NEURONTIN) 600 MG tablet Take 1,200 mg by mouth 3 (three) times daily.      Marland Kitchen ibuprofen (ADVIL,MOTRIN) 800 MG tablet Take 800 mg by mouth 2 (two) times daily as needed for moderate pain.      Marland Kitchen lisinopril-hydrochlorothiazide (PRINZIDE,ZESTORETIC) 20-12.5 MG per tablet Take 1 tablet by mouth daily.      . naproxen (NAPROSYN) 500 MG tablet Take 1 tablet (500 mg total) by mouth 2 (two) times daily with a meal.  30 tablet  0  . QUEtiapine (SEROQUEL) 100 MG tablet Take 100 mg by mouth at bedtime.      Marland Kitchen zolpidem (AMBIEN) 10 MG tablet Take 10 mg by mouth at bedtime.      Marland Kitchen  levofloxacin (LEVAQUIN) 750 MG tablet Take 1 tablet (750 mg total) by mouth daily.  7 tablet  0  . oxyCODONE-acetaminophen (PERCOCET)  5-325 MG per tablet Take 1 tablet by mouth every 4 (four) hours as needed.  20 tablet  0   Scheduled: . antiseptic oral rinse  7 mL Mouth Rinse q12n4p  . azithromycin  500 mg Intravenous Q24H  . chlorhexidine  15 mL Mouth Rinse BID  . clonazePAM  1 mg Oral Daily  . enoxaparin (LOVENOX) injection  60 mg Subcutaneous Q24H  . escitalopram  20 mg Oral Daily  . gabapentin  600 mg Oral TID  . guaiFENesin  1,200 mg Oral BID  . ipratropium-albuterol  3 mL Nebulization Q6H WA  . meropenem (MERREM) IV  1 g Intravenous 3 times per day  . methylPREDNISolone (SOLU-MEDROL) injection  60 mg Intravenous Q6H  . pantoprazole  40 mg Oral BID  . polyethylene glycol  17 g Oral TID  . QUEtiapine  100 mg Oral QHS  . senna  1 tablet Oral QHS  . sodium chloride  10-40 mL Intracatheter Q12H  . vancomycin  1,500 mg Intravenous Q12H  . zolpidem  10 mg Oral QHS   Continuous:  [id number], diazepam, HYDROmorphone (DILAUDID) injection, ondansetron (ZOFRAN) IV, ondansetron, oxyCODONE-acetaminophen, sodium chloride  Assesment: Max [name] was admitted with healthcare associated pneumonia and acute respiratory failure with hypoxia. Max [name] has a history of obstructive sleep apnea and is on CPAP. Max [name] has been using BiPAP here. Max [name] remains on non-rebreather mask and still has episodes of desaturation when Max [name] moves around or moves the mask. His chest x-ray is essentially unchanged. I think Max [name] is slowly slowly improving. Principal Problem:   Acute respiratory failure with hypoxia Active Problems:   HCAP (healthcare-associated pneumonia)   Dysphagia, unspecified(787.20)   Acute kidney injury   GERD (gastroesophageal reflux disease)   OSA on CPAP    Plan: Continue current treatments. I don't think is anything to add at this point. Perhaps take him off Seroquel. Max [name] says Max [name] took amitriptyline in the past    LOS: 8 days   Tahj Njoku L 10/06/2013, 8:16 AM

## 2013-10-06 NOTE — Progress Notes (Addendum)
PROGRESS NOTE  Max [name] OAC:[id number] DOB: 1950/12/15 DOA: 09/28/2013 PCP: [name], MD  Summary: 63 year old man with history of obstructive sleep apnea, asbestos exposure presented with increasing shortness of breath and was admitted for acute hypoxic respiratory failure and pneumonia. Oxygen requirement increased and antibiotics were broadened. CT chest revealed diffuse bilateral airspace disease. Other laboratory workup has been unrevealing, patient followed by pulmonology, plan to continue empiric antibiotics and steroids at this time for suspected inflammatory component. His condition remains tenuous but she has somewhat improved. If his condition declines may need bronchoscopy.  Assessment/Plan: 1. Acute hypoxic respiratory failure secondary to HCAP. Slowly improving. Continue supportive care and current treatment. Pulmonology on board, will follow recommendations. 2. Healthcare associated pneumonia. Specific etiology remains unclear but he seems to be responding to steroids suggesting an inflammatory component. Vasculitis labs still pending, ESR elevated. Consider viral pneumonia. May need bronchoscopy it is not improving or the next several days. 3. Acute kidney injury. Resolved. Secondary to poor oral intake, use of naprosyn and lisinopril/hydrochlorothiazide.  4. Dysphagia. History of esophageal stricture requiring dilatation. Followed by gastroenterology in [place] but requests f/u here in [place]. CT showed thickening of distal esophageal wall. Outpatient followup recommended. Will continue PPI, change diet to dysphagia 3 and add carafate 5. PTSD, depression, anxiety. Stable. May have had mild component of encephalopathy from steroids. Will continue current medication regimen 6. OSA, CPAP 7. Tobacco dependence in remission 8. Constipation: continue bowel regimen   Code Status: full code DVT prophylaxis: Lovenox Family Communication:  Disposition Plan:  home   Consultants:  Pulmonology  Procedures:  See below for x-ray reports   Antibiotics:  Zithromax 9/1 >>   Vancomycin 9/2 >>  Merrem 9/4 >>  Cefepime 9/2 >> 9/4  Ceftriaxone 9/1  HPI/Subjective: Feeling better, no fever. Main complaint outside SOB and hypoxia with minimal exertion; is difficulty swallowing and GERD.  Objective: Filed Vitals:   10/06/13 0700 10/06/13 0758 10/06/13 0800 10/06/13 0811  BP: 117/72     Pulse: 66  73   Temp:  97.6 F (36.4 C)    TempSrc:  Axillary    Resp: 15  19   Height:      Weight:      SpO2: 94%  91% 90%    Intake/Output Summary (Last 24 hours) at 10/06/13 0953 Last data filed at 10/06/13 0109  Gross per 24 hour  Intake    800 ml  Output    900 ml  Net   -100 ml     Filed Weights   10/03/13 0600 10/04/13 0500 10/05/13 0500  Weight: 118.4 kg (261 lb 0.4 oz) 119.7 kg (263 lb 14.3 oz) 121.1 kg (266 lb 15.6 oz)    Exam:     Afebrile, [name], still with some SOB with minimal exertion and O2 desat process when off nonrebreathing mask.  Respiratory: Clear to auscultation with fairly good air movement, no frank wheezes or rales; scattered rhonchi appreciated  Cardiovascular: regular rate and rhythm. No murmur, rub or gallop. No lower extremity edema. Telemetry sinus rhythm.  Abdomen: obese, no guarding or rebound, positive BS  Musculoskeletal: No joint swelling or erythema  Psychiatric: Grossly normal mood and affect. Speech fluent and appropriate.  Neuro: non focal deficit appreciated  Data Reviewed:  Excellent urine output  Basic metabolic panel unremarkable.   Total CK normal. WBC 24.8, difficult to interpret on high-dose steroids. ESR 65.   Blood cultures no growth, final. Sputum culture pending.  Scheduled Meds: . antiseptic  oral rinse  7 mL Mouth Rinse q12n4p  . azithromycin  500 mg Oral QHS  . chlorhexidine  15 mL Mouth Rinse BID  . clonazePAM  1 mg Oral Daily  . enoxaparin (LOVENOX) injection  60  mg Subcutaneous Q24H  . escitalopram  20 mg Oral Daily  . gabapentin  600 mg Oral TID  . guaiFENesin  1,200 mg Oral BID  . ipratropium-albuterol  3 mL Nebulization Q6H WA  . meropenem (MERREM) IV  1 g Intravenous 3 times per day  . methylPREDNISolone (SOLU-MEDROL) injection  60 mg Intravenous Q6H  . pantoprazole  40 mg Oral BID  . polyethylene glycol  17 g Oral TID  . QUEtiapine  100 mg Oral QHS  . senna  1 tablet Oral QHS  . sodium chloride  10-40 mL Intracatheter Q12H  . sucralfate  1 g Oral TID WC & HS  . vancomycin  1,500 mg Intravenous Q12H  . zolpidem  10 mg Oral QHS   Continuous Infusions:    Principal Problem:   Acute respiratory failure with hypoxia Active Problems:   HCAP (healthcare-associated pneumonia)   Dysphagia, unspecified(787.20)   Acute kidney injury   GERD (gastroesophageal reflux disease)   OSA on CPAP   Time spent 25  minutes  Barton Dubois, MD  [facility]  Pager [phone] If 7PM-7AM, please contact night-coverage at [email or web address], password [facility] 10/06/2013, 9:53 AM  LOS: 8 days

## 2013-10-06 NOTE — Progress Notes (Signed)
PHARMACIST - PHYSICIAN COMMUNICATION DR:   Hawkins CONCERNING: Antibiotic IV to Oral Route Change Policy  RECOMMENDATION: This patient is receiving Zithromax by the intravenous route.  Based on criteria approved by the Pharmacy and Therapeutics Committee, the antibiotic(s) is/are being converted to the equivalent oral dose form(s).   DESCRIPTION: These criteria include:  Patient being treated for a respiratory tract infection, urinary tract infection, cellulitis or clostridium difficile associated diarrhea if on metronidazole  The patient is not neutropenic and does not exhibit a GI malabsorption state  The patient is eating (either orally or via tube) and/or has been taking other orally administered medications for a least 24 hours  The patient is improving clinically and has a Tmax < 100.5  If you have questions about this conversion, please contact the Pharmacy Department  [x]  ( [phone] )  [facility] []  ( [phone] )  [facility]  []  ( [phone] )  [facility] []  ( [phone] )  [facility]   S. Sherell Christoffel, PharmD  

## 2013-10-07 LAB — BASIC METABOLIC PANEL
Anion gap: 10 (ref 5–15)
BUN: 21 mg/dL (ref 6–23)
CO2: 32 mEq/L (ref 19–32)
Calcium: 8.3 mg/dL — ABNORMAL LOW (ref 8.4–10.5)
Chloride: 100 mEq/L (ref 96–112)
Creatinine, Ser: 0.8 mg/dL (ref 0.50–1.35)
GFR calc Af Amer: >90 mL/min (ref >90)
GFR calc non Af Amer: >90 mL/min (ref >90)
Glucose, Bld: 174 mg/dL — ABNORMAL HIGH (ref 70–99)
Potassium: 4.6 mEq/L (ref 3.7–5.3)
Sodium: 142 mEq/L (ref 137–147)

## 2013-10-07 MED ORDER — METHOCARBAMOL 500 MG PO TABS
500.0000 mg | ORAL_TABLET | Freq: Four times a day (QID) | ORAL | Status: DC | PRN
Start: 2013-10-07 — End: 2013-10-18
  Administered 2013-10-09 – 2013-10-18 (×2): 500 mg via ORAL
  Filled 2013-10-07 (×2): qty 1

## 2013-10-07 MED ORDER — OXYCODONE-ACETAMINOPHEN 5-325 MG PO TABS: 1.0000 | ORAL_TABLET | Freq: Four times a day (QID) | ORAL | Status: DC | PRN

## 2013-10-07 MED ORDER — OXYCODONE-ACETAMINOPHEN 5-325 MG PO TABS
2.0000 | ORAL_TABLET | Freq: Four times a day (QID) | ORAL | Status: DC | PRN
  Administered 2013-10-07: 2 via ORAL
  Filled 2013-10-07: qty 2

## 2013-10-07 NOTE — Progress Notes (Signed)
PROGRESS NOTE  Max [name] SFK:[id number] DOB: Oct 07, 1950 DOA: 09/28/2013 PCP: [name], MD  Summary: 63 year old man with history of obstructive sleep apnea, asbestos exposure presented with increasing shortness of breath and was admitted for acute hypoxic respiratory failure and pneumonia. Oxygen requirement increased and antibiotics were broadened. CT chest revealed diffuse bilateral airspace disease. Other laboratory workup has been unrevealing, patient followed by pulmonology, plan to continue empiric antibiotics and steroids at this time for suspected inflammatory component. His condition remains tenuous but she has somewhat improved. If his condition declines may need bronchoscopy.  Assessment/Plan: 1. Acute hypoxic respiratory failure secondary to HCAP. Continue to improve slowly. Continue supportive care and current treatment. Pulmonology on board, will follow recommendations. 2. Healthcare associated pneumonia. Specific etiology remains unclear but he seems to be responding to steroids suggesting an inflammatory component. ESR elevated. Viral pneumonia is as part of differential, but given improvement with antibiotics and current regimen will continue current management. May need bronchoscopy at some point if not improving on the next several days. 3. Acute kidney injury. Resolved. Secondary to poor oral intake, use of naprosyn and lisinopril/hydrochlorothiazide.  4. Dysphagia. History of esophageal stricture requiring dilatation. Followed by gastroenterology in [place] but requests f/u here in [place]. CT showed thickening of distal esophageal wall. Outpatient followup recommended. Will continue PPI, change diet to dysphagia 3 and add carafate 5. PTSD, depression, anxiety. Stable. May have had mild component of encephalopathy from steroids. Will continue current medication regimen 6. OSA: continue CPAP 7. Tobacco dependence in remission 8. Constipation: continue bowel  regimen   Code Status: full code DVT prophylaxis: Lovenox Family Communication:  Disposition Plan: home when medically stable.   Consultants:  Pulmonology  Procedures:  See below for x-ray reports   Antibiotics:  Zithromax 9/1 >>   Vancomycin 9/2 >>  Merrem 9/4 >>  Cefepime 9/2 >> 9/4  HPI/Subjective: Feeling better, no fever. Still SOB with minimal exertion and desaturating when off non-rebreathing   Objective: Filed Vitals:   10/07/13 1200 10/07/13 1300 10/07/13 1400 10/07/13 1438  BP: 104/70 113/75 97/50   Pulse: 74 82 69   Temp: 97.4 F (36.3 C)     TempSrc: Axillary     Resp:      Height:      Weight:      SpO2: 95% 93% 95% 92%    Intake/Output Summary (Last 24 hours) at 10/07/13 1606 Last data filed at 10/07/13 0900  Gross per 24 hour  Intake      0 ml  Output   2550 ml  Net  -2550 ml     Filed Weights   10/04/13 0500 10/05/13 0500 10/07/13 0500  Weight: 119.7 kg (263 lb 14.3 oz) 121.1 kg (266 lb 15.6 oz) 120.1 kg (264 lb 12.4 oz)    Exam:     Afebrile, [name], still with some SOB with minimal exertion and O2 desat process when off nonrebreathing mask.  Respiratory: Clear to auscultation with fairly good air movement, no frank wheezes or rales; scattered rhonchi appreciated  Cardiovascular: regular rate and rhythm. No murmur, rub or gallop. No lower extremity edema. Telemetry sinus rhythm.  Abdomen: obese, no guarding or rebound, positive BS  Musculoskeletal: No joint swelling or erythema  Psychiatric: Grossly normal mood and affect. Speech fluent and appropriate.  Neuro: non focal deficit appreciated  Data Reviewed:  Excellent urine output  Basic metabolic panel unremarkable.   Total CK normal. WBC 24.8, difficult to interpret on high-dose steroids. ESR 65.  Blood cultures no growth, final. Sputum culture pending.  Scheduled Meds: . antiseptic oral rinse  7 mL Mouth Rinse q12n4p  . azithromycin  500 mg Oral QHS  .  chlorhexidine  15 mL Mouth Rinse BID  . clonazePAM  1 mg Oral Daily  . enoxaparin (LOVENOX) injection  60 mg Subcutaneous Q24H  . escitalopram  20 mg Oral Daily  . gabapentin  600 mg Oral TID  . guaiFENesin  1,200 mg Oral BID  . ipratropium-albuterol  3 mL Nebulization Q6H WA  . meropenem (MERREM) IV  1 g Intravenous 3 times per day  . methylPREDNISolone (SOLU-MEDROL) injection  60 mg Intravenous Q6H  . pantoprazole  40 mg Oral BID  . polyethylene glycol  17 g Oral TID  . QUEtiapine  100 mg Oral QHS  . senna  1 tablet Oral QHS  . sodium chloride  10-40 mL Intracatheter Q12H  . sucralfate  1 g Oral TID WC & HS  . vancomycin  1,500 mg Intravenous Q12H  . zolpidem  10 mg Oral QHS   Continuous Infusions:    Principal Problem:   Acute respiratory failure with hypoxia Active Problems:   HCAP (healthcare-associated pneumonia)   Dysphagia, unspecified(787.20)   Acute kidney injury   GERD (gastroesophageal reflux disease)   OSA on CPAP   Time spent 25  minutes  Barton Dubois, MD  [facility]  Pager [phone] If 7PM-7AM, please contact night-coverage at [email or web address], password [facility] 10/06/2013, 9:53 AM  LOS: 8 days

## 2013-10-07 NOTE — Progress Notes (Signed)
Pt refused to wear to wear BIPAP at this time will continue to monitor through out the night pt back on NRB spo2 94%

## 2013-10-07 NOTE — Progress Notes (Signed)
Nutrition Brief Note  Patient identified due to nutrition risk.  Admission diagnosis healthcare associated pneumonia and acute hypoxic respiratory failure.   Sodium  Date/Time Value Ref Range Status  10/07/2013  4:34 AM 142  137 - 147 mEq/L Final  10/06/2013  4:04 AM 142  137 - 147 mEq/L Final  10/05/2013  4:39 AM 142  137 - 147 mEq/L Final    Potassium  Date/Time Value Ref Range Status  10/07/2013  4:34 AM 4.6  3.7 - 5.3 mEq/L Final  10/06/2013  4:04 AM 4.5  3.7 - 5.3 mEq/L Final  10/05/2013  4:39 AM 4.8  3.7 - 5.3 mEq/L Final    No results found for this basename: phos    Magnesium  Date/Time Value Ref Range Status  10/05/2013  4:39 AM 2.4  1.5 - 2.5 mg/dL Final    Body mass index is 36.94 kg/(m^2). Patient meets criteria for obesity class II based on current BMI.   Nursing reports good appetite and po intake. Current diet order is Dysphagia 3 with thin liquids, patient is consuming approximately 100% of meals at this time. Labs and medications reviewed.   No nutrition interventions warranted at this time. If nutrition issues arise, please consult RD.   Royann Shivers MS,RD,CSG,LDN Office: [phone] Pager: [phone]

## 2013-10-07 NOTE — Progress Notes (Signed)
Subjective: He is slowly improving. He says he doesn't have any new complaints. He is still short of breath with exertion but that is improving. He is not coughing very much.  Objective: Vital signs in last 24 hours: Temp:  [97.6 F (36.4 C)-98.2 F (36.8 C)] 98.2 F (36.8 C) (09/09 0400) Pulse Rate:  [69-103] 70 (09/09 0200) Resp:  [14-31] 14 (09/09 0200) BP: (67-126)/(44-78) 118/68 mmHg (09/09 0200) SpO2:  [76 %-98 %] 92 % (09/09 0737) FiO2 (%):  [70 %-100 %] 100 % (09/09 0737) Weight:  [120.1 kg (264 lb 12.4 oz)] 120.1 kg (264 lb 12.4 oz) (09/09 0500) Weight change:  Last BM Date: 10/05/13  Intake/Output from previous day: 09/08 0701 - 09/09 0700 In: 1080 [P.O.:480; IV Piggyback:600] Out: 3050 [Urine:3050]  PHYSICAL EXAM General appearance: alert, cooperative and mild distress Resp: rhonchi bilaterally Cardio: regular rate and rhythm, S1, S2 normal, no murmur, click, rub or gallop GI: soft, non-tender; bowel sounds normal; no masses,  no organomegaly Extremities: extremities normal, atraumatic, no cyanosis or edema  Lab Results:  Results for orders placed during the hospital encounter of 09/28/13 (from the past 48 hour(s))  VANCOMYCIN, TROUGH     Status: None   Collection Time    10/05/13  8:40 AM      Result Value Ref Range   Vancomycin Tr 17.7  10.0 - 20.0 ug/mL  BASIC METABOLIC PANEL     Status: Abnormal   Collection Time    10/06/13  4:04 AM      Result Value Ref Range   Sodium 142  137 - 147 mEq/L   Potassium 4.5  3.7 - 5.3 mEq/L   Chloride 101  96 - 112 mEq/L   CO2 31  19 - 32 mEq/L   Glucose, Bld 123 (*) 70 - 99 mg/dL   BUN 27 (*) 6 - 23 mg/dL   Creatinine, Ser 0.85  0.50 - 1.35 mg/dL   Calcium 8.5  8.4 - 10.5 mg/dL   GFR calc non Af Amer >90  >90 mL/min   GFR calc Af Amer >90  >90 mL/min   Comment: (NOTE)     The eGFR has been calculated using the CKD EPI equation.     This calculation has not been validated in all clinical situations.     eGFR's  persistently <90 mL/min signify possible Chronic Kidney     Disease.   Anion gap 10  5 - 15  CBC     Status: Abnormal   Collection Time    10/06/13  4:04 AM      Result Value Ref Range   WBC 25.3 (*) 4.0 - 10.5 K/uL   RBC 3.44 (*) 4.22 - 5.81 MIL/uL   Hemoglobin 11.1 (*) 13.0 - 17.0 g/dL   HCT 33.6 (*) 39.0 - 52.0 %   MCV 97.7  78.0 - 100.0 fL   MCH 32.3  26.0 - 34.0 pg   MCHC 33.0  30.0 - 36.0 g/dL   RDW 13.2  11.5 - 15.5 %   Platelets 233  150 - 400 K/uL  BASIC METABOLIC PANEL     Status: Abnormal   Collection Time    10/07/13  4:34 AM      Result Value Ref Range   Sodium 142  137 - 147 mEq/L   Potassium 4.6  3.7 - 5.3 mEq/L   Chloride 100  96 - 112 mEq/L   CO2 32  19 - 32 mEq/L   Glucose, Bld  174 (*) 70 - 99 mg/dL   BUN 21  6 - 23 mg/dL   Creatinine, Ser 0.80  0.50 - 1.35 mg/dL   Calcium 8.3 (*) 8.4 - 10.5 mg/dL   GFR calc non Af Amer >90  >90 mL/min   GFR calc Af Amer >90  >90 mL/min   Comment: (NOTE)     The eGFR has been calculated using the CKD EPI equation.     This calculation has not been validated in all clinical situations.     eGFR's persistently <90 mL/min signify possible Chronic Kidney     Disease.   Anion gap 10  5 - 15    ABGS  Recent Labs  10/04/13 1553  PHART 7.370  PO2ART 64.2*  TCO2 24.7  HCO3 26.8*   CULTURES Recent Results (from the past 240 hour(s))  MRSA PCR SCREENING     Status: None   Collection Time    09/29/13  1:45 AM      Result Value Ref Range Status   MRSA by PCR NEGATIVE  NEGATIVE Final   Comment:            The GeneXpert MRSA Assay (FDA     approved for NASAL specimens     only), is one component of a     comprehensive MRSA colonization     surveillance program. It is not     intended to diagnose MRSA     infection nor to guide or     monitor treatment for     MRSA infections.  CULTURE, BLOOD (ROUTINE X 2)     Status: None   Collection Time    09/29/13  4:36 AM      Result Value Ref Range Status   Specimen  Description BLOOD LEFT ANTECUBITAL   Final   Special Requests BOTTLES DRAWN AEROBIC AND ANAEROBIC 10CC   Final   Culture NO GROWTH 5 DAYS   Final   Report Status 10/04/2013 FINAL   Final  CULTURE, BLOOD (ROUTINE X 2)     Status: None   Collection Time    09/29/13  4:36 AM      Result Value Ref Range Status   Specimen Description BLOOD LEFT HAND   Final   Special Requests BOTTLES DRAWN AEROBIC AND ANAEROBIC 8CC   Final   Culture NO GROWTH 5 DAYS   Final   Report Status 10/04/2013 FINAL   Final  CULTURE, RESPIRATORY (NON-EXPECTORATED)     Status: None   Collection Time    10/03/13  8:45 PM      Result Value Ref Range Status   Specimen Description THROAT   Final   Special Requests NONE   Final   Gram Stain     Final   Value: NO WBC SEEN     MODERATE SQUAMOUS EPITHELIAL CELLS PRESENT     FEW YEAST     FEW GRAM POSITIVE RODS     Performed at [facility]   Culture     Final   Value: MODERATE CANDIDA ALBICANS     Performed at [facility]   Report Status 10/06/2013 FINAL   Final   Studies/Results: Dg Chest Port 1 View  10/06/2013   CLINICAL DATA:  Sleep apnea, pneumonia, on BiPAP  EXAM: PORTABLE CHEST - 1 VIEW  COMPARISON:  Portable exam 0625 hr compared to 10/03/2013  FINDINGS: LEFT arm PICC line tip projects over SVC.  Severe diffuse BILATERAL airspace infiltrates persist, unchanged.  Heart size stable.  No gross pleural effusion or pneumothorax.  IMPRESSION: Persistent severe diffuse BILATERAL airspace infiltrates.   Electronically Signed   By: Lavonia Dana M.D.   On: 10/06/2013 08:11    Medications:  Prior to Admission:  Prescriptions prior to admission  Medication Sig Dispense Refill  . albuterol (PROVENTIL HFA;VENTOLIN HFA) 108 (90 BASE) MCG/ACT inhaler Inhale 2 puffs into the lungs every 4 (four) hours as needed for wheezing or shortness of breath.  1 Inhaler  3  . clonazePAM (KLONOPIN) 1 MG tablet Take 1 mg by mouth daily.      . diazepam (VALIUM) 5 MG tablet  Take 1 tablet (5 mg total) by mouth every 6 (six) hours as needed for muscle spasms.  10 tablet  0  . escitalopram (LEXAPRO) 20 MG tablet Take 20 mg by mouth daily.        Marland Kitchen gabapentin (NEURONTIN) 600 MG tablet Take 1,200 mg by mouth 3 (three) times daily.      Marland Kitchen ibuprofen (ADVIL,MOTRIN) 800 MG tablet Take 800 mg by mouth 2 (two) times daily as needed for moderate pain.      Marland Kitchen lisinopril-hydrochlorothiazide (PRINZIDE,ZESTORETIC) 20-12.5 MG per tablet Take 1 tablet by mouth daily.      . naproxen (NAPROSYN) 500 MG tablet Take 1 tablet (500 mg total) by mouth 2 (two) times daily with a meal.  30 tablet  0  . QUEtiapine (SEROQUEL) 100 MG tablet Take 100 mg by mouth at bedtime.      Marland Kitchen zolpidem (AMBIEN) 10 MG tablet Take 10 mg by mouth at bedtime.      Marland Kitchen levofloxacin (LEVAQUIN) 750 MG tablet Take 1 tablet (750 mg total) by mouth daily.  7 tablet  0  . oxyCODONE-acetaminophen (PERCOCET) 5-325 MG per tablet Take 1 tablet by mouth every 4 (four) hours as needed.  20 tablet  0   Scheduled: . antiseptic oral rinse  7 mL Mouth Rinse q12n4p  . azithromycin  500 mg Oral QHS  . chlorhexidine  15 mL Mouth Rinse BID  . clonazePAM  1 mg Oral Daily  . enoxaparin (LOVENOX) injection  60 mg Subcutaneous Q24H  . escitalopram  20 mg Oral Daily  . gabapentin  600 mg Oral TID  . guaiFENesin  1,200 mg Oral BID  . ipratropium-albuterol  3 mL Nebulization Q6H WA  . meropenem (MERREM) IV  1 g Intravenous 3 times per day  . methylPREDNISolone (SOLU-MEDROL) injection  60 mg Intravenous Q6H  . pantoprazole  40 mg Oral BID  . polyethylene glycol  17 g Oral TID  . QUEtiapine  100 mg Oral QHS  . senna  1 tablet Oral QHS  . sodium chloride  10-40 mL Intracatheter Q12H  . sucralfate  1 g Oral TID WC & HS  . vancomycin  1,500 mg Intravenous Q12H  . zolpidem  10 mg Oral QHS   Continuous:  FSE:[id number], diazepam, HYDROmorphone (DILAUDID) injection, methocarbamol, ondansetron (ZOFRAN) IV, ondansetron,  oxyCODONE-acetaminophen, sodium chloride  Assesment: He was admitted with healthcare associated pneumonia and associated acute hypoxic respiratory failure. He is known to have obstructive sleep apnea and has not always been compliant with CPAP. He has improved with intravenous steroids which would indicate that there is probably an inflammatory component of his problem in addition to an infectious component. I agree this very well could be viral pneumonia but since he is improving on current treatments I would continue with his current antibiotic coverage. Principal Problem:   Acute  respiratory failure with hypoxia Active Problems:   HCAP (healthcare-associated pneumonia)   Dysphagia, unspecified(787.20)   Acute kidney injury   GERD (gastroesophageal reflux disease)   OSA on CPAP    Plan: Continue treatments. Although his progress is slow he is improving    LOS: 9 days   Noreen Mackintosh L 10/07/2013, 8:01 AM

## 2013-10-08 MED ORDER — HYDROMORPHONE HCL PF 1 MG/ML IJ SOLN
1.0000 mg | Freq: Four times a day (QID) | INTRAMUSCULAR | Status: DC | PRN
  Administered 2013-10-08 – 2013-10-16 (×28): 1 mg via INTRAVENOUS
  Filled 2013-10-08 (×30): qty 1

## 2013-10-08 MED ORDER — ACETAMINOPHEN 325 MG PO TABS: 650.0000 mg | ORAL_TABLET | Freq: Four times a day (QID) | ORAL | Status: DC | PRN

## 2013-10-08 MED ORDER — OXYCODONE HCL 5 MG PO TABS
15.0000 mg | ORAL_TABLET | ORAL | Status: DC | PRN
  Administered 2013-10-08 – 2013-10-18 (×47): 15 mg via ORAL
  Filled 2013-10-08 (×47): qty 3

## 2013-10-08 MED ORDER — METHYLPREDNISOLONE SODIUM SUCC 125 MG IJ SOLR
60.0000 mg | Freq: Three times a day (TID) | INTRAMUSCULAR | Status: DC
  Administered 2013-10-08 – 2013-10-10 (×6): 60 mg via INTRAVENOUS
  Filled 2013-10-08 (×6): qty 2

## 2013-10-08 NOTE — Progress Notes (Signed)
Subjective: He says he feels better. He still wants more pain medication and of course problem with that as his respiratory status. He is coughing a little more. He is able to maintain his oxygenation longer periods of time without high flow oxygen  Objective: Vital signs in last 24 hours: Temp:  [97 F (36.1 C)-97.8 F (36.6 C)] 97.8 F (36.6 C) (09/10 0745) Pulse Rate:  [64-107] 79 (09/10 0700) Resp:  [13-26] 26 (09/10 0700) BP: (87-125)/(36-82) 103/55 mmHg (09/10 0700) SpO2:  [66 %-98 %] 83 % (09/10 0700) FiO2 (%):  [100 %] 100 % (09/09 1438) Weight:  [120.2 kg (264 lb 15.9 oz)] 120.2 kg (264 lb 15.9 oz) (09/10 0500) Weight change: 0.1 kg (3.5 oz) Last BM Date: 10/05/13  Intake/Output from previous day: 09/09 0701 - 09/10 0700 In: 480 [P.O.:480] Out: 1425 [Urine:1425]  PHYSICAL EXAM General appearance: alert, cooperative and moderate distress Resp: rhonchi bilaterally Cardio: regular rate and rhythm, S1, S2 normal, no murmur, click, rub or gallop GI: soft, non-tender; bowel sounds normal; no masses,  no organomegaly Extremities: extremities normal, atraumatic, no cyanosis or edema  Lab Results:  Results for orders placed during the hospital encounter of 09/28/13 (from the past 48 hour(s))  BASIC METABOLIC PANEL     Status: Abnormal   Collection Time    10/07/13  4:34 AM      Result Value Ref Range   Sodium 142  137 - 147 mEq/L   Potassium 4.6  3.7 - 5.3 mEq/L   Chloride 100  96 - 112 mEq/L   CO2 32  19 - 32 mEq/L   Glucose, Bld 174 (*) 70 - 99 mg/dL   BUN 21  6 - 23 mg/dL   Creatinine, Ser 0.80  0.50 - 1.35 mg/dL   Calcium 8.3 (*) 8.4 - 10.5 mg/dL   GFR calc non Af Amer >90  >90 mL/min   GFR calc Af Amer >90  >90 mL/min   Comment: (NOTE)     The eGFR has been calculated using the CKD EPI equation.     This calculation has not been validated in all clinical situations.     eGFR's persistently <90 mL/min signify possible Chronic Kidney     Disease.   Anion gap 10   5 - 15    ABGS No results found for this basename: PHART, PCO2, PO2ART, TCO2, HCO3,  in the last 72 hours CULTURES Recent Results (from the past 240 hour(s))  MRSA PCR SCREENING     Status: None   Collection Time    09/29/13  1:45 AM      Result Value Ref Range Status   MRSA by PCR NEGATIVE  NEGATIVE Final   Comment:            The GeneXpert MRSA Assay (FDA     approved for NASAL specimens     only), is one component of a     comprehensive MRSA colonization     surveillance program. It is not     intended to diagnose MRSA     infection nor to guide or     monitor treatment for     MRSA infections.  CULTURE, BLOOD (ROUTINE X 2)     Status: None   Collection Time    09/29/13  4:36 AM      Result Value Ref Range Status   Specimen Description BLOOD LEFT ANTECUBITAL   Final   Special Requests BOTTLES DRAWN AEROBIC AND ANAEROBIC 10CC  Final   Culture NO GROWTH 5 DAYS   Final   Report Status 10/04/2013 FINAL   Final  CULTURE, BLOOD (ROUTINE X 2)     Status: None   Collection Time    09/29/13  4:36 AM      Result Value Ref Range Status   Specimen Description BLOOD LEFT HAND   Final   Special Requests BOTTLES DRAWN AEROBIC AND ANAEROBIC 8CC   Final   Culture NO GROWTH 5 DAYS   Final   Report Status 10/04/2013 FINAL   Final  CULTURE, RESPIRATORY (NON-EXPECTORATED)     Status: None   Collection Time    10/03/13  8:45 PM      Result Value Ref Range Status   Specimen Description THROAT   Final   Special Requests NONE   Final   Gram Stain     Final   Value: NO WBC SEEN     MODERATE SQUAMOUS EPITHELIAL CELLS PRESENT     FEW YEAST     FEW GRAM POSITIVE RODS     Performed at [facility]   Culture     Final   Value: MODERATE CANDIDA ALBICANS     Performed at [facility]   Report Status 10/06/2013 FINAL   Final   Studies/Results: No results found.  Medications:  Prior to Admission:  Prescriptions prior to admission  Medication Sig Dispense Refill  .  albuterol (PROVENTIL HFA;VENTOLIN HFA) 108 (90 BASE) MCG/ACT inhaler Inhale 2 puffs into the lungs every 4 (four) hours as needed for wheezing or shortness of breath.  1 Inhaler  3  . clonazePAM (KLONOPIN) 1 MG tablet Take 1 mg by mouth daily.      . diazepam (VALIUM) 5 MG tablet Take 1 tablet (5 mg total) by mouth every 6 (six) hours as needed for muscle spasms.  10 tablet  0  . escitalopram (LEXAPRO) 20 MG tablet Take 20 mg by mouth daily.        Marland Kitchen gabapentin (NEURONTIN) 600 MG tablet Take 1,200 mg by mouth 3 (three) times daily.      Marland Kitchen ibuprofen (ADVIL,MOTRIN) 800 MG tablet Take 800 mg by mouth 2 (two) times daily as needed for moderate pain.      Marland Kitchen lisinopril-hydrochlorothiazide (PRINZIDE,ZESTORETIC) 20-12.5 MG per tablet Take 1 tablet by mouth daily.      . naproxen (NAPROSYN) 500 MG tablet Take 1 tablet (500 mg total) by mouth 2 (two) times daily with a meal.  30 tablet  0  . QUEtiapine (SEROQUEL) 100 MG tablet Take 100 mg by mouth at bedtime.      Marland Kitchen zolpidem (AMBIEN) 10 MG tablet Take 10 mg by mouth at bedtime.      Marland Kitchen levofloxacin (LEVAQUIN) 750 MG tablet Take 1 tablet (750 mg total) by mouth daily.  7 tablet  0  . oxyCODONE-acetaminophen (PERCOCET) 5-325 MG per tablet Take 1 tablet by mouth every 4 (four) hours as needed.  20 tablet  0   Scheduled: . antiseptic oral rinse  7 mL Mouth Rinse q12n4p  . azithromycin  500 mg Oral QHS  . chlorhexidine  15 mL Mouth Rinse BID  . clonazePAM  1 mg Oral Daily  . enoxaparin (LOVENOX) injection  60 mg Subcutaneous Q24H  . escitalopram  20 mg Oral Daily  . gabapentin  600 mg Oral TID  . guaiFENesin  1,200 mg Oral BID  . ipratropium-albuterol  3 mL Nebulization Q6H WA  . meropenem (MERREM) IV  1 g Intravenous 3 times per day  . methylPREDNISolone (SOLU-MEDROL) injection  60 mg Intravenous Q6H  . pantoprazole  40 mg Oral BID  . polyethylene glycol  17 g Oral TID  . QUEtiapine  100 mg Oral QHS  . senna  1 tablet Oral QHS  . sodium chloride  10-40  mL Intracatheter Q12H  . sucralfate  1 g Oral TID WC & HS  . vancomycin  1,500 mg Intravenous Q12H  . zolpidem  10 mg Oral QHS   Continuous:  [id number], diazepam, HYDROmorphone (DILAUDID) injection, methocarbamol, ondansetron (ZOFRAN) IV, ondansetron, oxyCODONE-acetaminophen, sodium chloride  Assesment: He was admitted with acute respiratory failure with hypoxia. He has healthcare associated pneumonia. He has improved with steroids so I think this probably some inflammatory component of this as well as infectious component. He seems to be doing better. He is requesting more pain medication. Principal Problem:   Acute respiratory failure with hypoxia Active Problems:   HCAP (healthcare-associated pneumonia)   Dysphagia, unspecified(787.20)   Acute kidney injury   GERD (gastroesophageal reflux disease)   OSA on CPAP    Plan: Continue IV antibiotics steroids etc.    LOS: 10 days   Dionicia Cerritos L 10/08/2013, 9:00 AM

## 2013-10-08 NOTE — Progress Notes (Signed)
PROGRESS NOTE  MALACKI MCPHEARSON ZOX:[id number] DOB: 06-13-1950 DOA: 09/28/2013 PCP: [name], MD  Summary: 63 year old man with history of obstructive sleep apnea, asbestos exposure presented with increasing shortness of breath and was admitted for acute hypoxic respiratory failure and pneumonia. Oxygen requirement increased and antibiotics were broadened. CT chest revealed diffuse bilateral airspace disease. Other laboratory workup has been unrevealing, patient followed by pulmonology, plan to continue empiric antibiotics and steroids at this time for suspected inflammatory component. His condition remains tenuous but she has somewhat improved. If his condition declines may need bronchoscopy.  Assessment/Plan: 1. Acute hypoxic respiratory failure secondary to HCAP. Continue to improve slowly. Continue supportive care and current treatment. Pulmonology on board, will follow recommendations. 2. Healthcare associated pneumonia. Specific etiology remains unclear but he seems to be responding to steroids suggesting an inflammatory component. ESR elevated. Viral pneumonia is as part of differential, but given improvement with antibiotics and current regimen will continue current management. May need bronchoscopy at some point if not improving on the next several days. 3. Acute kidney injury. Resolved. Secondary to poor oral intake, use of naprosyn and use of lisinopril/hydrochlorothiazide. Will monitor 4. Dysphagia. History of esophageal stricture requiring dilatation. Followed by gastroenterology in [place] but requests f/u here in [place]. CT showed thickening of distal esophageal wall. Outpatient followup recommended. Will continue PPI, continue dysphagia 3 diet and carafate 5. PTSD, depression, anxiety. Stable. May have had mild component of encephalopathy from steroids. Will continue current medication regimen. But start slowly weaning high dose solumedrol 6. OSA: continue CPAP QHS, and  enforced usage  7. Tobacco dependence in remission 8. Constipation: continue bowel regimen   Code Status: full code DVT prophylaxis: Lovenox Family Communication:  Disposition Plan: home when medically stable.   Consultants:  Pulmonology  Procedures:  See below for x-ray reports   Antibiotics:  Zithromax 9/1 >>   Vancomycin 9/2 >>  Merrem 9/4 >>  Cefepime 9/2 >> 9/4  HPI/Subjective: Feeling better, but still SOB with minimal exertion and desaturating when off non-rebreathing. No Fever. Asking for pain meds to be adjusted.  Objective: Filed Vitals:   10/08/13 0700 10/08/13 0745 10/08/13 0800 10/08/13 0900  BP: 103/55  104/64 108/58  Pulse: 79  93 89  Temp:  97.8 F (36.6 C)    TempSrc:  Oral    Resp: 26  26 21   Height:      Weight:      SpO2: 83%  89% 92%    Intake/Output Summary (Last 24 hours) at 10/08/13 1050 Last data filed at 10/08/13 0900  Gross per 24 hour  Intake    480 ml  Output   1725 ml  Net  -1245 ml     Filed Weights   10/05/13 0500 10/07/13 0500 10/08/13 0500  Weight: 121.1 kg (266 lb 15.6 oz) 120.1 kg (264 lb 12.4 oz) 120.2 kg (264 lb 15.9 oz)    Exam:     Afebrile, [name], still with some SOB with minimal exertion and O2 desat process when off nonrebreathing mask.  Respiratory: Clear to auscultation with fairly good air movement, no frank wheezes or rales; scattered rhonchi appreciated  Cardiovascular: regular rate and rhythm. No murmur, rub or gallop. No lower extremity edema. Telemetry sinus rhythm.  Abdomen: obese, no guarding or rebound, positive BS  Musculoskeletal: No joint swelling or erythema  Psychiatric: Grossly normal mood and affect. Speech fluent and appropriate.  Neuro: non focal deficit appreciated  Data Reviewed:  Excellent urine output  Basic metabolic  panel unremarkable.   Total CK normal. WBC 24.8, difficult to interpret on high-dose steroids. ESR 65.   Blood cultures no growth, final. Sputum  culture pending.  Scheduled Meds: . antiseptic oral rinse  7 mL Mouth Rinse q12n4p  . azithromycin  500 mg Oral QHS  . chlorhexidine  15 mL Mouth Rinse BID  . clonazePAM  1 mg Oral Daily  . enoxaparin (LOVENOX) injection  60 mg Subcutaneous Q24H  . escitalopram  20 mg Oral Daily  . gabapentin  600 mg Oral TID  . guaiFENesin  1,200 mg Oral BID  . ipratropium-albuterol  3 mL Nebulization Q6H WA  . meropenem (MERREM) IV  1 g Intravenous 3 times per day  . methylPREDNISolone (SOLU-MEDROL) injection  60 mg Intravenous 3 times per day  . pantoprazole  40 mg Oral BID  . polyethylene glycol  17 g Oral TID  . QUEtiapine  100 mg Oral QHS  . senna  1 tablet Oral QHS  . sodium chloride  10-40 mL Intracatheter Q12H  . sucralfate  1 g Oral TID WC & HS  . vancomycin  1,500 mg Intravenous Q12H  . zolpidem  10 mg Oral QHS   Continuous Infusions:    Principal Problem:   Acute respiratory failure with hypoxia Active Problems:   HCAP (healthcare-associated pneumonia)   Dysphagia, unspecified(787.20)   Acute kidney injury   GERD (gastroesophageal reflux disease)   OSA on CPAP   Time spent 25  minutes  Barton Dubois, MD  [facility]  Pager [phone] If 7PM-7AM, please contact night-coverage at [email or web address], password [facility] 10/06/2013, 9:53 AM  LOS: 8 days

## 2013-10-09 LAB — BASIC METABOLIC PANEL
Anion gap: 6 (ref 5–15)
BUN: 25 mg/dL — ABNORMAL HIGH (ref 6–23)
CO2: 37 mEq/L — ABNORMAL HIGH (ref 19–32)
Calcium: 8.6 mg/dL (ref 8.4–10.5)
Chloride: 97 mEq/L (ref 96–112)
Creatinine, Ser: 0.83 mg/dL (ref 0.50–1.35)
GFR calc Af Amer: >90 mL/min (ref >90)
GFR calc non Af Amer: >90 mL/min (ref >90)
Glucose, Bld: 140 mg/dL — ABNORMAL HIGH (ref 70–99)
Potassium: 5.3 mEq/L (ref 3.7–5.3)
Sodium: 140 mEq/L (ref 137–147)

## 2013-10-09 MED ORDER — AMOXICILLIN-POT CLAVULANATE 875-125 MG PO TABS
1.0000 | ORAL_TABLET | Freq: Two times a day (BID) | ORAL | Status: AC
  Administered 2013-10-09 – 2013-10-13 (×8): 1 via ORAL
  Filled 2013-10-09 (×10): qty 1

## 2013-10-09 NOTE — Progress Notes (Addendum)
PROGRESS NOTE  Max [name] LKG:[id number] DOB: 1951-01-26 DOA: 09/28/2013 PCP: [name], MD  Summary: 63 year old man with history of obstructive sleep apnea, asbestos exposure presented with increasing shortness of breath and was admitted for acute hypoxic respiratory failure and pneumonia. Oxygen requirement increased and antibiotics were broadened. CT chest revealed diffuse bilateral airspace disease. Other laboratory workup has been unrevealing, patient followed by pulmonology, plan to continue empiric antibiotics and steroids at this time for suspected inflammatory component. His condition remains tenuous but she has somewhat improved. If his condition declines may need bronchoscopy.  Assessment/Plan: 1. Acute hypoxic respiratory failure secondary to HCAP. Continue to improve slowly. Continue supportive care and current treatment. Pulmonology on board, will follow recommendations. 2. Healthcare associated pneumonia. Specific etiology remains unclear but he seems to be responding to steroids suggesting an inflammatory component. ESR elevated. Viral pneumonia is as part of differential, but given improvement with antibiotics and current regimen will continue current management (but will start tapering prednisone slowly). May need bronchoscopy at some point if not improving on the next several days. Will attempt narrowing antibiotics. 3. Acute kidney injury. Resolved. Secondary to poor oral intake, use of naprosyn and use of lisinopril/hydrochlorothiazide. Will monitor 4. Dysphagia. History of esophageal stricture requiring dilatation. Followed by gastroenterology in [employer] but requests f/u here in [place]. CT showed thickening of distal esophageal wall. Outpatient followup recommended. Will continue PPI, continue dysphagia 3 diet and carafate 5. PTSD, depression, anxiety. Stable. May have had mild component of encephalopathy from steroids. Will continue current medication regimen. But  start slowly weaning high dose solumedrol 6. OSA: continue CPAP QHS, and enforced usage  7. Tobacco dependence in remission 8. Constipation: continue bowel regimen   Code Status: full code DVT prophylaxis: Lovenox Family Communication:  Disposition Plan: home when medically stable.   Consultants:  Pulmonology  Procedures:  See below for x-ray reports   Antibiotics:  Zithromax 9/1 >> 911  Vancomycin 9/2 >>911  Merrem 9/4 >>911  Cefepime 9/2 >> 9/4  augmentin 911  HPI/Subjective: [name], still with SOB with minimal exertion and O2 desat process when off nonrebreathing mask. But slowly improving and feeling a lot better. Pain is better controlled.  Objective: Filed Vitals:   10/09/13 0756 10/09/13 0805 10/09/13 1131 10/09/13 1604  BP:      Pulse:      Temp: 97.9 F (36.6 C)  97.9 F (36.6 C) 97.6 F (36.4 C)  TempSrc: Oral  Oral Oral  Resp:      Height:      Weight:      SpO2:  96%      Intake/Output Summary (Last 24 hours) at 10/09/13 1708 Last data filed at 10/09/13 0912  Gross per 24 hour  Intake   1450 ml  Output   1500 ml  Net    -50 ml     Filed Weights   10/07/13 0500 10/08/13 0500 10/09/13 0500  Weight: 120.1 kg (264 lb 12.4 oz) 120.2 kg (264 lb 15.9 oz) 121 kg (266 lb 12.1 oz)    Exam:     Afebrile, [name], still with SOB with minimal exertion and O2 desat process when off nonrebreathing mask. But slowly improving and feeling a lot better.  Respiratory: Clear to auscultation with fairly good air movement, no frank wheezes or rales; scattered rhonchi appreciated  Cardiovascular: regular rate and rhythm. No murmur, rub or gallop. No lower extremity edema. Telemetry sinus rhythm.  Abdomen: obese, no guarding or rebound, positive BS  Musculoskeletal:  No joint swelling or erythema  Psychiatric: Grossly normal mood and affect. Speech fluent and appropriate.  Neuro: non focal deficit appreciated  Data Reviewed:  Excellent urine  output  Basic metabolic panel unremarkable.   Total CK normal. WBC 24.8, difficult to interpret on high-dose steroids. ESR 65.   Blood cultures no growth, final. Sputum culture pending.  Scheduled Meds: . antiseptic oral rinse  7 mL Mouth Rinse q12n4p  . azithromycin  500 mg Oral QHS  . chlorhexidine  15 mL Mouth Rinse BID  . clonazePAM  1 mg Oral Daily  . enoxaparin (LOVENOX) injection  60 mg Subcutaneous Q24H  . escitalopram  20 mg Oral Daily  . gabapentin  600 mg Oral TID  . guaiFENesin  1,200 mg Oral BID  . ipratropium-albuterol  3 mL Nebulization Q6H WA  . meropenem (MERREM) IV  1 g Intravenous 3 times per day  . methylPREDNISolone (SOLU-MEDROL) injection  60 mg Intravenous 3 times per day  . pantoprazole  40 mg Oral BID  . polyethylene glycol  17 g Oral TID  . QUEtiapine  100 mg Oral QHS  . senna  1 tablet Oral QHS  . sodium chloride  10-40 mL Intracatheter Q12H  . sucralfate  1 g Oral TID WC & HS  . vancomycin  1,500 mg Intravenous Q12H  . zolpidem  10 mg Oral QHS   Continuous Infusions:    Principal Problem:   Acute respiratory failure with hypoxia Active Problems:   HCAP (healthcare-associated pneumonia)   Dysphagia, unspecified(787.20)   Acute kidney injury   GERD (gastroesophageal reflux disease)   OSA on CPAP   Time spent 25  minutes  Barton Dubois, MD  [facility]  Pager [phone] If 7PM-7AM, please contact night-coverage at [email or web address], password [facility] 10/06/2013, 9:53 AM  LOS: 8 days

## 2013-10-09 NOTE — Progress Notes (Signed)
Subjective: He says he feels better. He has no new complaints. His pain is better controlled. His breathing has improved but he is still requiring high flow oxygen  Objective: Vital signs in last 24 hours: Temp:  [97.6 F (36.4 C)-98.4 F (36.9 C)] 98.1 F (36.7 C) (09/11 0500) Pulse Rate:  [64-97] 75 (09/11 0600) Resp:  [10-32] 17 (09/11 0600) BP: (87-135)/(32-91) 113/50 mmHg (09/11 0600) SpO2:  [69 %-99 %] 94 % (09/11 0600) FiO2 (%):  [100 %] 100 % (09/10 2000) Weight:  [121 kg (266 lb 12.1 oz)] 121 kg (266 lb 12.1 oz) (09/11 0500) Weight change: 0.8 kg (1 lb 12.2 oz) Last BM Date: 10/07/13  Intake/Output from previous day: 09/10 0701 - 09/11 0700 In: 4510 [P.O.:1200; I.V.:10; IV Piggyback:3300] Out: 2900 [Urine:2900]  PHYSICAL EXAM General appearance: alert, cooperative and no distress Resp: rhonchi bilaterally Cardio: regular rate and rhythm, S1, S2 normal, no murmur, click, rub or gallop GI: soft, non-tender; bowel sounds normal; no masses,  no organomegaly Extremities: extremities normal, atraumatic, no cyanosis or edema  Lab Results:  Results for orders placed during the hospital encounter of 09/28/13 (from the past 48 hour(s))  BASIC METABOLIC PANEL     Status: Abnormal   Collection Time    10/09/13  4:16 AM      Result Value Ref Range   Sodium 140  137 - 147 mEq/L   Potassium 5.3  3.7 - 5.3 mEq/L   Chloride 97  96 - 112 mEq/L   CO2 37 (*) 19 - 32 mEq/L   Glucose, Bld 140 (*) 70 - 99 mg/dL   BUN 25 (*) 6 - 23 mg/dL   Creatinine, Ser 0.83  0.50 - 1.35 mg/dL   Calcium 8.6  8.4 - 10.5 mg/dL   GFR calc non Af Amer >90  >90 mL/min   GFR calc Af Amer >90  >90 mL/min   Comment: (NOTE)     The eGFR has been calculated using the CKD EPI equation.     This calculation has not been validated in all clinical situations.     eGFR's persistently <90 mL/min signify possible Chronic Kidney     Disease.   Anion gap 6  5 - 15    ABGS No results found for this basename:  PHART, PCO2, PO2ART, TCO2, HCO3,  in the last 72 hours CULTURES Recent Results (from the past 240 hour(s))  CULTURE, RESPIRATORY (NON-EXPECTORATED)     Status: None   Collection Time    10/03/13  8:45 PM      Result Value Ref Range Status   Specimen Description THROAT   Final   Special Requests NONE   Final   Gram Stain     Final   Value: NO WBC SEEN     MODERATE SQUAMOUS EPITHELIAL CELLS PRESENT     FEW YEAST     FEW GRAM POSITIVE RODS     Performed at [facility]   Culture     Final   Value: MODERATE CANDIDA ALBICANS     Performed at [facility]   Report Status 10/06/2013 FINAL   Final   Studies/Results: No results found.  Medications:  Prior to Admission:  Prescriptions prior to admission  Medication Sig Dispense Refill  . albuterol (PROVENTIL HFA;VENTOLIN HFA) 108 (90 BASE) MCG/ACT inhaler Inhale 2 puffs into the lungs every 4 (four) hours as needed for wheezing or shortness of breath.  1 Inhaler  3  . clonazePAM (KLONOPIN) 1  MG tablet Take 1 mg by mouth daily.      . diazepam (VALIUM) 5 MG tablet Take 1 tablet (5 mg total) by mouth every 6 (six) hours as needed for muscle spasms.  10 tablet  0  . escitalopram (LEXAPRO) 20 MG tablet Take 20 mg by mouth daily.        Marland Kitchen gabapentin (NEURONTIN) 600 MG tablet Take 1,200 mg by mouth 3 (three) times daily.      Marland Kitchen ibuprofen (ADVIL,MOTRIN) 800 MG tablet Take 800 mg by mouth 2 (two) times daily as needed for moderate pain.      Marland Kitchen lisinopril-hydrochlorothiazide (PRINZIDE,ZESTORETIC) 20-12.5 MG per tablet Take 1 tablet by mouth daily.      . naproxen (NAPROSYN) 500 MG tablet Take 1 tablet (500 mg total) by mouth 2 (two) times daily with a meal.  30 tablet  0  . QUEtiapine (SEROQUEL) 100 MG tablet Take 100 mg by mouth at bedtime.      Marland Kitchen zolpidem (AMBIEN) 10 MG tablet Take 10 mg by mouth at bedtime.      Marland Kitchen levofloxacin (LEVAQUIN) 750 MG tablet Take 1 tablet (750 mg total) by mouth daily.  7 tablet  0  .  oxyCODONE-acetaminophen (PERCOCET) 5-325 MG per tablet Take 1 tablet by mouth every 4 (four) hours as needed.  20 tablet  0   Scheduled: . antiseptic oral rinse  7 mL Mouth Rinse q12n4p  . azithromycin  500 mg Oral QHS  . chlorhexidine  15 mL Mouth Rinse BID  . clonazePAM  1 mg Oral Daily  . enoxaparin (LOVENOX) injection  60 mg Subcutaneous Q24H  . escitalopram  20 mg Oral Daily  . gabapentin  600 mg Oral TID  . guaiFENesin  1,200 mg Oral BID  . ipratropium-albuterol  3 mL Nebulization Q6H WA  . meropenem (MERREM) IV  1 g Intravenous 3 times per day  . methylPREDNISolone (SOLU-MEDROL) injection  60 mg Intravenous 3 times per day  . pantoprazole  40 mg Oral BID  . polyethylene glycol  17 g Oral TID  . QUEtiapine  100 mg Oral QHS  . senna  1 tablet Oral QHS  . sodium chloride  10-40 mL Intracatheter Q12H  . sucralfate  1 g Oral TID WC & HS  . vancomycin  1,500 mg Intravenous Q12H  . zolpidem  10 mg Oral QHS   Continuous:  CLE:[id number], albuterol, diazepam, HYDROmorphone (DILAUDID) injection, methocarbamol, ondansetron (ZOFRAN) IV, ondansetron, oxyCODONE, sodium chloride  Assesment: He was admitted with bilateral diffuse pneumonia and acute respiratory failure with hypoxia. He is improving but slowly. This morning he says he feels much better. He has less pain. His breathing has improved. Principal Problem:   Acute respiratory failure with hypoxia Active Problems:   HCAP (healthcare-associated pneumonia)   Dysphagia, unspecified(787.20)   Acute kidney injury   GERD (gastroesophageal reflux disease)   OSA on CPAP    Plan: I agree with reduction of steroids. Continue his other medications.    LOS: 11 days   Armas Mcbee L 10/09/2013, 7:50 AM

## 2013-10-10 ENCOUNTER — Inpatient Hospital Stay (HOSPITAL_COMMUNITY): Payer: Medicaid Other

## 2013-10-10 MED ORDER — GABAPENTIN 300 MG PO CAPS
300.0000 mg | ORAL_CAPSULE | Freq: Three times a day (TID) | ORAL | Status: DC
  Administered 2013-10-10 – 2013-10-18 (×24): 300 mg via ORAL
  Filled 2013-10-10 (×25): qty 1

## 2013-10-10 MED ORDER — METHYLPREDNISOLONE SODIUM SUCC 125 MG IJ SOLR
60.0000 mg | Freq: Two times a day (BID) | INTRAMUSCULAR | Status: DC
  Administered 2013-10-10 – 2013-10-11 (×2): 60 mg via INTRAVENOUS
  Filled 2013-10-10 (×2): qty 2

## 2013-10-10 MED ORDER — INFLUENZA VAC SPLIT QUAD 0.5 ML IM SUSY
0.5000 mL | PREFILLED_SYRINGE | INTRAMUSCULAR | Status: AC
  Administered 2013-10-10: 0.5 mL via INTRAMUSCULAR
  Filled 2013-10-10: qty 0.5

## 2013-10-10 NOTE — Progress Notes (Signed)
Subjective: His feels much better. He is on the non-rebreather mask. He has no new complaints  Objective: Vital signs in last 24 hours: Temp:  [97.6 F (36.4 C)-98 F (36.7 C)] 97.9 F (36.6 C) (09/12 0748) Pulse Rate:  [62-84] 69 (09/12 0800) Resp:  [9-33] 13 (09/12 0800) BP: (101-124)/(69-101) 110/69 mmHg (09/12 0800) SpO2:  [83 %-99 %] 93 % (09/12 0800) FiO2 (%):  [55 %-86 %] 55 % (09/12 0800) Weight change:  Last BM Date: 10/08/13  Intake/Output from previous day: 09/11 0701 - 09/12 0700 In: 620 [I.V.:20; IV Piggyback:600] Out: 1175 [Urine:1175]  PHYSICAL EXAM General appearance: alert, cooperative and mild distress Resp: clear to auscultation bilaterally Cardio: regular rate and rhythm, S1, S2 normal, no murmur, click, rub or gallop GI: soft, non-tender; bowel sounds normal; no masses,  no organomegaly Extremities: extremities normal, atraumatic, no cyanosis or edema  Lab Results:  Results for orders placed during the hospital encounter of 09/28/13 (from the past 48 hour(s))  BASIC METABOLIC PANEL     Status: Abnormal   Collection Time    10/09/13  4:16 AM      Result Value Ref Range   Sodium 140  137 - 147 mEq/L   Potassium 5.3  3.7 - 5.3 mEq/L   Chloride 97  96 - 112 mEq/L   CO2 37 (*) 19 - 32 mEq/L   Glucose, Bld 140 (*) 70 - 99 mg/dL   BUN 25 (*) 6 - 23 mg/dL   Creatinine, Ser 0.83  0.50 - 1.35 mg/dL   Calcium 8.6  8.4 - 10.5 mg/dL   GFR calc non Af Amer >90  >90 mL/min   GFR calc Af Amer >90  >90 mL/min   Comment: (NOTE)     The eGFR has been calculated using the CKD EPI equation.     This calculation has not been validated in all clinical situations.     eGFR's persistently <90 mL/min signify possible Chronic Kidney     Disease.   Anion gap 6  5 - 15    ABGS No results found for this basename: PHART, PCO2, PO2ART, TCO2, HCO3,  in the last 72 hours CULTURES Recent Results (from the past 240 hour(s))  CULTURE, RESPIRATORY (NON-EXPECTORATED)      Status: None   Collection Time    10/03/13  8:45 PM      Result Value Ref Range Status   Specimen Description THROAT   Final   Special Requests NONE   Final   Gram Stain     Final   Value: NO WBC SEEN     MODERATE SQUAMOUS EPITHELIAL CELLS PRESENT     FEW YEAST     FEW GRAM POSITIVE RODS     Performed at [facility]   Culture     Final   Value: MODERATE CANDIDA ALBICANS     Performed at [facility]   Report Status 10/06/2013 FINAL   Final   Studies/Results: No results found.  Medications:  Prior to Admission:  Prescriptions prior to admission  Medication Sig Dispense Refill  . albuterol (PROVENTIL HFA;VENTOLIN HFA) 108 (90 BASE) MCG/ACT inhaler Inhale 2 puffs into the lungs every 4 (four) hours as needed for wheezing or shortness of breath.  1 Inhaler  3  . clonazePAM (KLONOPIN) 1 MG tablet Take 1 mg by mouth daily.      . diazepam (VALIUM) 5 MG tablet Take 1 tablet (5 mg total) by mouth every 6 (six) hours  as needed for muscle spasms.  10 tablet  0  . escitalopram (LEXAPRO) 20 MG tablet Take 20 mg by mouth daily.        Marland Kitchen gabapentin (NEURONTIN) 600 MG tablet Take 1,200 mg by mouth 3 (three) times daily.      Marland Kitchen ibuprofen (ADVIL,MOTRIN) 800 MG tablet Take 800 mg by mouth 2 (two) times daily as needed for moderate pain.      Marland Kitchen lisinopril-hydrochlorothiazide (PRINZIDE,ZESTORETIC) 20-12.5 MG per tablet Take 1 tablet by mouth daily.      . naproxen (NAPROSYN) 500 MG tablet Take 1 tablet (500 mg total) by mouth 2 (two) times daily with a meal.  30 tablet  0  . QUEtiapine (SEROQUEL) 100 MG tablet Take 100 mg by mouth at bedtime.      Marland Kitchen zolpidem (AMBIEN) 10 MG tablet Take 10 mg by mouth at bedtime.      Marland Kitchen levofloxacin (LEVAQUIN) 750 MG tablet Take 1 tablet (750 mg total) by mouth daily.  7 tablet  0  . oxyCODONE-acetaminophen (PERCOCET) 5-325 MG per tablet Take 1 tablet by mouth every 4 (four) hours as needed.  20 tablet  0   Scheduled: . amoxicillin-clavulanate  1  tablet Oral Q12H  . antiseptic oral rinse  7 mL Mouth Rinse q12n4p  . chlorhexidine  15 mL Mouth Rinse BID  . clonazePAM  1 mg Oral Daily  . enoxaparin (LOVENOX) injection  60 mg Subcutaneous Q24H  . escitalopram  20 mg Oral Daily  . gabapentin  600 mg Oral TID  . guaiFENesin  1,200 mg Oral BID  . Influenza vac split quadrivalent PF  0.5 mL Intramuscular Tomorrow-1000  . ipratropium-albuterol  3 mL Nebulization Q6H WA  . methylPREDNISolone (SOLU-MEDROL) injection  60 mg Intravenous Q12H  . pantoprazole  40 mg Oral BID  . polyethylene glycol  17 g Oral TID  . QUEtiapine  100 mg Oral QHS  . senna  1 tablet Oral QHS  . sodium chloride  10-40 mL Intracatheter Q12H  . sucralfate  1 g Oral TID WC & HS  . zolpidem  10 mg Oral QHS   Continuous:  OAC:[id number], albuterol, diazepam, HYDROmorphone (DILAUDID) injection, methocarbamol, ondansetron (ZOFRAN) IV, ondansetron, oxyCODONE, sodium chloride  Assesment: He is of uterus is irregular and associated itching respiratory failure with CRP he is slowly improving he he he is obstructive sleep apnea. He is on  Less oxygen now. Principal Problem:   Acute respiratory failure with hypoxia Active Problems:   HCAP (healthcare-associated pneumonia)   Dysphagia, unspecified(787.20)   Acute kidney injury   GERD (gastroesophageal reflux disease)   OSA on CPAP    Plan: Continue attempting reduction of oxygen. Continue other treatments.    LOS: 12 days   Abbagale Goguen L 10/10/2013, 8:52 AM

## 2013-10-10 NOTE — Progress Notes (Signed)
PROGRESS NOTE  Max [name] WIO:[id number] DOB: 10-25-1950 DOA: 09/28/2013 PCP: [name], MD  Summary: 63 year old man with history of obstructive sleep apnea, asbestos exposure presented with increasing shortness of breath and was admitted for acute hypoxic respiratory failure and pneumonia. Oxygen requirement increased and antibiotics were broadened. CT chest revealed diffuse bilateral airspace disease. Other laboratory workup has been unrevealing, patient followed by pulmonology, plan to continue empiric antibiotics and steroids at this time for suspected inflammatory component. His condition remains tenuous but she has continue slowly improving. If his condition declines might ended needing bronchoscopy.  Assessment/Plan: 1. Acute hypoxic respiratory failure secondary to HCAP. Continue to improve slowly. Continue supportive care and current treatment. Pulmonology on board, will follow recommendations. 2. Healthcare associated pneumonia. Specific etiology remains unclear but he seems to be responding to steroids suggesting an inflammatory component. ESR elevated. Viral pneumonia is as part of differential, but given improvement with antibiotics and current regimen will continue current management (but will continue slowly  tapering steroids). Will follow now on narrowed antibiotics (augmentin). Requiring less oxygen. Will repeat CXR 2 views  3. Acute kidney injury. Resolved. Secondary to poor oral intake, use of naprosyn and use of lisinopril/hydrochlorothiazide. Will monitor. Continue holding nephrotoxic agents. 4. Dysphagia. History of esophageal stricture requiring dilatation. Followed by gastroenterology in [place] but requests f/u here in [place]. CT showed thickening of distal esophageal wall. Outpatient followup recommended. Will continue PPI, continue dysphagia 3 diet and carafate 5. PTSD, depression, anxiety. Stable. May have had mild component of encephalopathy from steroids.  Will continue current medication regimen. But will continue slowly weaning steroids 6. OSA: continue CPAP QHS, and enforced usage  7. Tobacco dependence in remission 8. Constipation: continue bowel regimen 9. Chronic pain and sciatica: will continue chronic pain regimen and the use of neurontin.   Code Status: full code DVT prophylaxis: Lovenox Family Communication:  Disposition Plan: home when medically stable.   Consultants:  Pulmonology  Procedures:  See below for x-ray reports   Antibiotics:  Zithromax 9/1 >> 911  Vancomycin 9/2 >>911  Merrem 9/4 >>911  Cefepime 9/2 >> 9/4  augmentin 9/11>>  HPI/Subjective: Feeling much better; requiring a little less oxygen still with desat when off venturi mask, intermittently coughing and with mild SOB. No fever.  Objective: Filed Vitals:   10/10/13 0735 10/10/13 0748 10/10/13 0800 10/10/13 0900  BP:   110/69 100/68  Pulse:   69 73  Temp:  97.9 F (36.6 C)    TempSrc:      Resp:   13   Height:      Weight:      SpO2: 94%  93% 91%    Intake/Output Summary (Last 24 hours) at 10/10/13 0950 Last data filed at 10/10/13 0900  Gross per 24 hour  Intake    590 ml  Output   1175 ml  Net   -585 ml     Filed Weights   10/07/13 0500 10/08/13 0500 10/09/13 0500  Weight: 120.1 kg (264 lb 12.4 oz) 120.2 kg (264 lb 15.9 oz) 121 kg (266 lb 12.1 oz)    Exam:     Afebrile, [name], still with SOB. Feeling a lot better. Now using less oxygen (on venturi mask)  Respiratory: Clear to auscultation with fairly good air movement, no frank wheezes or rales; scattered rhonchi appreciated  Cardiovascular: regular rate and rhythm. No murmur, rub or gallop. No lower extremity edema. Telemetry sinus rhythm.  Abdomen: obese, no guarding or rebound, positive BS  Musculoskeletal: No joint swelling or erythema  Psychiatric: Grossly normal mood and affect. Speech fluent and appropriate.  Neuro: non focal deficit appreciated  Data  Reviewed:  Excellent urine output  Basic metabolic panel unremarkable.   Total CK normal. WBC 24.8, difficult to interpret on high-dose steroids. ESR 65.   Blood cultures no growth, final. Sputum culture pending.  Scheduled Meds: . amoxicillin-clavulanate  1 tablet Oral Q12H  . antiseptic oral rinse  7 mL Mouth Rinse q12n4p  . chlorhexidine  15 mL Mouth Rinse BID  . clonazePAM  1 mg Oral Daily  . enoxaparin (LOVENOX) injection  60 mg Subcutaneous Q24H  . escitalopram  20 mg Oral Daily  . gabapentin  600 mg Oral TID  . guaiFENesin  1,200 mg Oral BID  . ipratropium-albuterol  3 mL Nebulization Q6H WA  . methylPREDNISolone (SOLU-MEDROL) injection  60 mg Intravenous Q12H  . pantoprazole  40 mg Oral BID  . polyethylene glycol  17 g Oral TID  . QUEtiapine  100 mg Oral QHS  . senna  1 tablet Oral QHS  . sodium chloride  10-40 mL Intracatheter Q12H  . sucralfate  1 g Oral TID WC & HS  . zolpidem  10 mg Oral QHS   Continuous Infusions:    Principal Problem:   Acute respiratory failure with hypoxia Active Problems:   HCAP (healthcare-associated pneumonia)   Dysphagia, unspecified(787.20)   Acute kidney injury   GERD (gastroesophageal reflux disease)   OSA on CPAP   Time spent 25  minutes  Barton Dubois, MD  [facility]  Pager [phone] If 7PM-7AM, please contact night-coverage at [email or web address], password [facility] 10/06/2013, 9:53 AM  LOS: 8 days

## 2013-10-11 MED ORDER — AMITRIPTYLINE HCL 25 MG PO TABS
50.0000 mg | ORAL_TABLET | Freq: Every day | ORAL | Status: DC
  Administered 2013-10-11 – 2013-10-17 (×7): 50 mg via ORAL
  Filled 2013-10-11 (×7): qty 2

## 2013-10-11 MED ORDER — DIAZEPAM 5 MG PO TABS
5.0000 mg | ORAL_TABLET | Freq: Three times a day (TID) | ORAL | Status: DC
  Administered 2013-10-11 – 2013-10-18 (×20): 5 mg via ORAL
  Filled 2013-10-11 (×20): qty 1

## 2013-10-11 MED ORDER — METHYLPREDNISOLONE SODIUM SUCC 40 MG IJ SOLR
20.0000 mg | Freq: Two times a day (BID) | INTRAMUSCULAR | Status: DC
  Administered 2013-10-11 – 2013-10-13 (×4): 20 mg via INTRAVENOUS
  Filled 2013-10-11 (×4): qty 1

## 2013-10-11 MED ORDER — FUROSEMIDE 10 MG/ML IJ SOLN
40.0000 mg | Freq: Two times a day (BID) | INTRAMUSCULAR | Status: AC
  Administered 2013-10-11 – 2013-10-12 (×4): 40 mg via INTRAVENOUS
  Filled 2013-10-11 (×4): qty 4

## 2013-10-11 NOTE — Progress Notes (Signed)
PROGRESS NOTE  Max [name] CHY:[id number] DOB: 1950/07/06 DOA: 09/28/2013 PCP: [name], MD  Summary: 63 year old man with history of obstructive sleep apnea, asbestos exposure presented with increasing shortness of breath and was admitted for acute hypoxic respiratory failure and pneumonia. Oxygen requirement increased and antibiotics were broadened. CT chest revealed diffuse bilateral airspace disease. Other laboratory workup has been unrevealing, patient followed by pulmonology, plan to continue empiric antibiotics and steroids at this time for suspected inflammatory component. His condition remains tenuous but she has continue slowly improving. If his condition declines might ended needing bronchoscopy.  Assessment/Plan: 1. Acute hypoxic respiratory failure secondary to HCAP. Continue to improve slowly. Continue supportive care and current treatment. Pulmonology on board, will follow recommendations. 2. Healthcare associated pneumonia. Specific etiology remains unclear but he seems to be responding to steroids suggesting an inflammatory component. ESR elevated. Viral pneumonia is as part of differential, but given improvement with antibiotics and current regimen will continue current management (but will continue slowly  tapering steroids). Will follow now on narrowed antibiotics (augmentin). Requiring less oxygen. CXR 2 views reviwed and demonstrating interstitial edema vs ARDS and multilobar PNA. Will add lasix BID for couple doses and encourage CPAP use. Follow CXR 3. Acute kidney injury. Resolved. Secondary to poor oral intake, use of naprosyn and use of lisinopril/hydrochlorothiazide. Will monitor. Continue holding nephrotoxic agents. 4. Dysphagia. History of esophageal stricture requiring dilatation. Followed by gastroenterology in [place] but requests f/u here in [place]. CT showed thickening of distal esophageal wall. Outpatient followup recommended. Will continue PPI, continue  dysphagia 3 diet and carafate 5. PTSD, depression, anxiety. Stable. May have had mild component of encephalopathy from steroids. Will continue current medication regimen. Will continue slowly weaning steroids; after discussing and educating patient about auto-medication; will stop klonopin and use valium TID. 6. OSA: continue CPAP QHS, and enforced usage  7. Tobacco dependence in remission 8. Constipation: continue bowel regimen 9. Chronic pain and sciatica: will continue chronic pain regimen, continue robaxin and the use of neurontin.   Code Status: full code DVT prophylaxis: Lovenox Family Communication:  Disposition Plan: home when medically stable.   Consultants:  Pulmonology  Procedures:  See below for x-ray reports   Antibiotics:  Zithromax 9/1 >> 911  Vancomycin 9/2 >>911  Merrem 9/4 >>911  Cefepime 9/2 >> 9/4  augmentin 9/11>>  HPI/Subjective:  [name], still with SOB. Continue improving and is requiring a little less oxygen. No fever. Overnight had issues with automedication taking extra klonopin. No nausea, no vomiting.  Objective: Filed Vitals:   10/11/13 0800 10/11/13 0900 10/11/13 0936 10/11/13 1000  BP: 118/68 103/62  107/69  Pulse: 71 70  89  Temp:      TempSrc:      Resp: 18 16  19   Height:      Weight:      SpO2: 100% 92% 93% 95%    Intake/Output Summary (Last 24 hours) at 10/11/13 1044 Last data filed at 10/11/13 1020  Gross per 24 hour  Intake   2200 ml  Output   3570 ml  Net  -1370 ml     Filed Weights   10/08/13 0500 10/09/13 0500 10/11/13 0500  Weight: 120.2 kg (264 lb 15.9 oz) 121 kg (266 lb 12.1 oz) 113.8 kg (250 lb 14.1 oz)    Exam:     Afebrile, [name], still with SOB. Continue improving and is requiring a little less oxygen.  Respiratory: fairly good air movement, no frank wheezes; positive bibasilar crackles and  scattered rhonchi appreciated  Cardiovascular: regular rate and rhythm. No murmur, rubs or gallop. No lower  extremity edema. Telemetry sinus rhythm.  Abdomen: obese, no guarding or rebound, positive BS  Musculoskeletal: No joint swelling or erythema  Psychiatric: Grossly normal mood and affect. Speech fluent and appropriate.  Neuro: non focal deficit appreciated  Data Reviewed:  Excellent urine output  Basic metabolic panel unremarkable.   Total CK normal. WBC 24.8, difficult to interpret on high-dose steroids. ESR 65.   Blood cultures no growth, final. Sputum culture pending.  Scheduled Meds: . amitriptyline  50 mg Oral QHS  . amoxicillin-clavulanate  1 tablet Oral Q12H  . antiseptic oral rinse  7 mL Mouth Rinse q12n4p  . chlorhexidine  15 mL Mouth Rinse BID  . diazepam  5 mg Oral Q8H  . enoxaparin (LOVENOX) injection  60 mg Subcutaneous Q24H  . escitalopram  20 mg Oral Daily  . furosemide  40 mg Intravenous BID  . gabapentin  300 mg Oral TID  . guaiFENesin  1,200 mg Oral BID  . ipratropium-albuterol  3 mL Nebulization Q6H WA  . methylPREDNISolone (SOLU-MEDROL) injection  20 mg Intravenous Q12H  . pantoprazole  40 mg Oral BID  . polyethylene glycol  17 g Oral TID  . senna  1 tablet Oral QHS  . sodium chloride  10-40 mL Intracatheter Q12H  . sucralfate  1 g Oral TID WC & HS  . zolpidem  10 mg Oral QHS   Continuous Infusions:    Principal Problem:   Acute respiratory failure with hypoxia Active Problems:   HCAP (healthcare-associated pneumonia)   Dysphagia, unspecified(787.20)   Acute kidney injury   GERD (gastroesophageal reflux disease)   OSA on CPAP   Time spent 25  minutes  Barton Dubois, MD  [facility]  Pager [phone] If 7PM-7AM, please contact night-coverage at [email or web address], password [facility] 10/06/2013, 9:53 AM  LOS: 8 days

## 2013-10-11 NOTE — Progress Notes (Signed)
While given pain medication (Oxy IR 15 mg) and anti-anxiety medication (Valium 5 mg), the patient requested his other pain medication Dilaudid 1 mg by IV as well which is not due until after 3 am (as needed medication).  The patient then stated "I will just my own meds then."

## 2013-10-11 NOTE — Progress Notes (Signed)
Patient was taken off CPAP with Respiratory Therapy since he has been non-compliant to keep it on at night.  His oxygen saturation is still at a low 80's with Non-re-breather mask and 4 liters via nasal cannula.  Patient was educated by RT and nursing about the importance of his CPAP.   While in the room straightening and wiping off his table to have his flutter valve and incentive spirometer within reach for easy access, I found medications that were sitting in a cup holder on his beside table.  I asked the patient where did he get the pills (two blue E 64 pills)?  He stated "they are my Zantac from home."  I explained that I have to remove them from his room and the importance of him taking medication we give him for various medication reactions and recalls on lot number medications as an example.  He stated "I understand but what am I going to take for my heartburn?"  I informed him if he takes Zantac at home for heartburn we maybe able to ask the MD in the morning for a prescription for while he is in the hospital.

## 2013-10-11 NOTE — Progress Notes (Signed)
eLink Physician-Brief Progress Note Patient Name: Max [name] DOB: [birth year] MRN: [id number]   Date of Service  10/11/2013  HPI/Events of Note  63 yr old male with hypoxemia from lung injury following pneumonia.  CXR with severe bilat infiltrates.  Patient feels better but oxygen requirement still significant.  Still at risk for resp failure requiring intubation.  eICU Interventions  Continue to monitor closely Supplemental oxygen to keep sat 90% or greater     Intervention Category Major Interventions: Hypoxemia - evaluation and management  Henry Russel, P 10/11/2013, 3:51 PM

## 2013-10-11 NOTE — Progress Notes (Signed)
Subjective: Events of last night are noted. He says he understands that he should not be self-medicating. This apparently is a fairly long pattern because he was given Klonopin 1 mg and told to take 1 a day but continued to adjust medications on his own. He does have anxiety depression and posttraumatic stress disorder. His breathing seems to be doing better.  Objective: Vital signs in last 24 hours: Temp:  [97.6 F (36.4 C)-98.1 F (36.7 C)] 97.6 F (36.4 C) (09/13 0745) Pulse Rate:  [63-88] 63 (09/13 0700) Resp:  [8-24] 16 (09/13 0700) BP: (96-125)/(40-98) 108/60 mmHg (09/13 0700) SpO2:  [72 %-100 %] 96 % (09/13 0700) FiO2 (%):  [40 %-55 %] 40 % (09/13 0745) Weight:  [113.8 kg (250 lb 14.1 oz)] 113.8 kg (250 lb 14.1 oz) (09/13 0500) Weight change:  Last BM Date: 10/10/13  Intake/Output from previous day: 09/12 0701 - 09/13 0700 In: 2210 [P.O.:2160; I.V.:50] Out: 2695 [Urine:2695]  PHYSICAL EXAM General appearance: alert, cooperative and mild distress Resp: He has some rales and rhonchi bilaterally Cardio: regular rate and rhythm, S1, S2 normal, no murmur, click, rub or gallop GI: soft, non-tender; bowel sounds normal; no masses,  no organomegaly Extremities: extremities normal, atraumatic, no cyanosis or edema  Lab Results:  No results found for this or any previous visit (from the past 48 hour(s)).  ABGS No results found for this basename: PHART, PCO2, PO2ART, TCO2, HCO3,  in the last 72 hours CULTURES Recent Results (from the past 240 hour(s))  CULTURE, RESPIRATORY (NON-EXPECTORATED)     Status: None   Collection Time    10/03/13  8:45 PM      Result Value Ref Range Status   Specimen Description THROAT   Final   Special Requests NONE   Final   Gram Stain     Final   Value: NO WBC SEEN     MODERATE SQUAMOUS EPITHELIAL CELLS PRESENT     FEW YEAST     FEW GRAM POSITIVE RODS     Performed at [facility]   Culture     Final   Value: MODERATE CANDIDA ALBICANS      Performed at [facility]   Report Status 10/06/2013 FINAL   Final   Studies/Results: Dg Chest Port 1 View  10/10/2013   CLINICAL DATA:  Shortness of breath.  EXAM: PORTABLE CHEST - 1 VIEW  COMPARISON:  Chest x-ray 10/06/2013.  FINDINGS: Lung volumes are low. Diffuse interstitial and airspace disease throughout the lungs bilaterally, unchanged. No definite pleural effusions, although a trace bilateral pleural effusions are difficult to exclude. Pulmonary vasculature is completely obscured. Heart size appears borderline enlarged. Mediastinal contours are largely obscured. Multiple soft tissue anchors in the left humeral head, presumably from prior rotator cuff surgery.  IMPRESSION: 1. Slightly worsened aeration compared to the recent prior examination from 10/06/2013. Widespread interstitial and airspace disease is again concerning for potential severe pulmonary edema, inflammatory process such as ARDS, diffuse alveolar hemorrhage and/or severe multilobar pneumonia.   Electronically Signed   By: Trudie Reed M.D.   On: 10/10/2013 12:02    Medications:  Prior to Admission:  Prescriptions prior to admission  Medication Sig Dispense Refill  . albuterol (PROVENTIL HFA;VENTOLIN HFA) 108 (90 BASE) MCG/ACT inhaler Inhale 2 puffs into the lungs every 4 (four) hours as needed for wheezing or shortness of breath.  1 Inhaler  3  . clonazePAM (KLONOPIN) 1 MG tablet Take 1 mg by mouth daily.      Marland Kitchen  diazepam (VALIUM) 5 MG tablet Take 1 tablet (5 mg total) by mouth every 6 (six) hours as needed for muscle spasms.  10 tablet  0  . escitalopram (LEXAPRO) 20 MG tablet Take 20 mg by mouth daily.        Marland Kitchen gabapentin (NEURONTIN) 600 MG tablet Take 1,200 mg by mouth 3 (three) times daily.      Marland Kitchen ibuprofen (ADVIL,MOTRIN) 800 MG tablet Take 800 mg by mouth 2 (two) times daily as needed for moderate pain.      Marland Kitchen lisinopril-hydrochlorothiazide (PRINZIDE,ZESTORETIC) 20-12.5 MG per tablet Take 1 tablet by  mouth daily.      . naproxen (NAPROSYN) 500 MG tablet Take 1 tablet (500 mg total) by mouth 2 (two) times daily with a meal.  30 tablet  0  . QUEtiapine (SEROQUEL) 100 MG tablet Take 100 mg by mouth at bedtime.      Marland Kitchen zolpidem (AMBIEN) 10 MG tablet Take 10 mg by mouth at bedtime.      Marland Kitchen levofloxacin (LEVAQUIN) 750 MG tablet Take 1 tablet (750 mg total) by mouth daily.  7 tablet  0  . oxyCODONE-acetaminophen (PERCOCET) 5-325 MG per tablet Take 1 tablet by mouth every 4 (four) hours as needed.  20 tablet  0   Scheduled: . amoxicillin-clavulanate  1 tablet Oral Q12H  . antiseptic oral rinse  7 mL Mouth Rinse q12n4p  . chlorhexidine  15 mL Mouth Rinse BID  . clonazePAM  1 mg Oral Daily  . enoxaparin (LOVENOX) injection  60 mg Subcutaneous Q24H  . escitalopram  20 mg Oral Daily  . gabapentin  300 mg Oral TID  . guaiFENesin  1,200 mg Oral BID  . ipratropium-albuterol  3 mL Nebulization Q6H WA  . methylPREDNISolone (SOLU-MEDROL) injection  20 mg Intravenous Q12H  . pantoprazole  40 mg Oral BID  . polyethylene glycol  17 g Oral TID  . QUEtiapine  100 mg Oral QHS  . senna  1 tablet Oral QHS  . sodium chloride  10-40 mL Intracatheter Q12H  . sucralfate  1 g Oral TID WC & HS  . zolpidem  10 mg Oral QHS   Continuous:  [id number], albuterol, diazepam, HYDROmorphone (DILAUDID) injection, methocarbamol, ondansetron (ZOFRAN) IV, ondansetron, oxyCODONE, sodium chloride  Assesment: He was admitted with acute respiratory failure with hypoxia. He is healthcare associated pneumonia which is multilobar. He has multiple other medical problems and psychiatric issues. He is improving as far as his lung function is concerned and is now on nasal cannula. His chest x-ray yesterday looked worse but he looks significantly better. Principal Problem:   Acute respiratory failure with hypoxia Active Problems:   HCAP (healthcare-associated pneumonia)   Dysphagia, unspecified(787.20)   Acute kidney injury    GERD (gastroesophageal reflux disease)   OSA on CPAP    Plan: Continue with current treatments. He denies any other illicit drug use but some of his lung problem could be related to that if present. He is improving as far as his oxygenation is concerned and I would continue treatments    LOS: 13 days   Max [name] L 10/11/2013, 7:58 AM

## 2013-10-11 NOTE — Progress Notes (Addendum)
Patient was informed of the move and reasoning for search with the [employer].  Patient agreed to search by staff and Security.  All patients belongings searched prior to transfer to room ICU 9.  Patient still stated pills were Zantac until I explained I knew the medication was Klonopin 1 mg (E 64).  Patient then stated "the meds would probably be hidden in the foil in the room."  When patient was moved to ICU 9 from ICU 11 for search we found two more blue pills (E 64).  All pills placed in patient's drawer in medication room per House Supervisor to show MD in the morning.   Please, address morning Klonopin and prn Valium with AM MD prior to giving medication.   Patient did state that one Psychiatrist gave him Klonopin three times a day and another recently switched it to once daily in the AM.  He also prefers to take Amitriptyline instead of the Seroquel at bedtime because it doesn't give him bad dreams.

## 2013-10-11 NOTE — Progress Notes (Signed)
Rushed in the patient's room when seeing all leads off from the monitor and oxygen saturation below the 80's to see what was going on.  Patient was all tangled up trying to get his shirt from the sink which he asked for assistance removing earlier.  With the assistance of the other staff RN from ICU we straightened his telemetry leads, oxygen and blood pressure cuff he had another blue pill (E 64) shake out of his pillows.  We assisted him on putting his shirt back on.  Patient has made a second attempt to get up and reach for his cane and get up a second time with both the NT and I going in the room.  Patient stated he did not need any assistance and "was afraid to get me in trouble."  I said pharmacy will see what the medication is and he displayed increased nervousness and anxiety.    The above situation has been discussed with the [facility] supervisor and on-call Hospitalists.

## 2013-10-12 LAB — BASIC METABOLIC PANEL
Anion gap: 8 (ref 5–15)
BUN: 31 mg/dL — ABNORMAL HIGH (ref 6–23)
CO2: 40 mEq/L (ref 19–32)
Calcium: 8.6 mg/dL (ref 8.4–10.5)
Chloride: 92 mEq/L — ABNORMAL LOW (ref 96–112)
Creatinine, Ser: 0.92 mg/dL (ref 0.50–1.35)
GFR calc Af Amer: >90 mL/min (ref >90)
GFR calc non Af Amer: 87 mL/min — ABNORMAL LOW (ref >90)
Glucose, Bld: 98 mg/dL (ref 70–99)
Potassium: 5 mEq/L (ref 3.7–5.3)
Sodium: 140 mEq/L (ref 137–147)

## 2013-10-12 MED ORDER — METOCLOPRAMIDE HCL 5 MG/ML IJ SOLN: 10.0000 mg | Freq: Once | INTRAMUSCULAR | Status: DC

## 2013-10-12 NOTE — Progress Notes (Signed)
PROGRESS NOTE  Max [name] QMV:[id number] DOB: Apr 02, 1950 DOA: 09/28/2013 PCP: [name], MD  Summary: 63 year old man with history of obstructive sleep apnea, asbestos exposure presented with increasing shortness of breath and was admitted for acute hypoxic respiratory failure and pneumonia. Oxygen requirement increased and antibiotics were broadened. CT chest revealed diffuse bilateral airspace disease. Other laboratory workup has been unrevealing, patient followed by pulmonology, plan to continue empiric antibiotics and steroids at this time for suspected inflammatory component. His condition remains tenuous but she has continue slowly improving. If his condition declines might ended needing bronchoscopy.  Assessment/Plan: 1. Acute hypoxic respiratory failure secondary to HCAP. Continue to improve slowly. Continue supportive care and current treatment. Pulmonology on board, will follow recommendations. 2. Healthcare associated pneumonia. Specific etiology remains unclear but he seems to be responding to steroids suggesting an inflammatory component. ESR elevated. Viral pneumonia is as part of differential, but given improvement with antibiotics and current regimen will continue current management (but will continue slowly  tapering steroids). Will follow now on narrowed antibiotics (augmentin). Requiring less oxygen. CXR 2 views reviwed and demonstrating interstitial edema vs ARDS and multilobar PNA. Will add lasix BID for couple doses and encourage CPAP use. Follow CXR in am 3. Acute kidney injury. Resolved. Secondary to poor oral intake, use of naprosyn and use of lisinopril/hydrochlorothiazide. Will monitor. Continue holding nephrotoxic agents. 4. Dysphagia. History of esophageal stricture requiring dilatation. Followed by gastroenterology in [place] but requests f/u here in [place]. CT showed thickening of distal esophageal wall. Outpatient followup recommended. Will continue PPI,  continue dysphagia 3 diet and carafate 5. PTSD, depression, anxiety. Stable. May have had mild component of encephalopathy from steroids. Will continue current medication regimen. Will continue slowly weaning steroids; after discussing and educating patient about auto-medication; will stop klonopin and use valium TID. 6. OSA: continue CPAP QHS, and enforced usage  7. Tobacco dependence in remission 8. Constipation: continue bowel regimen 9. Chronic pain and sciatica: will continue chronic pain regimen, continue robaxin and the use of neurontin.   Code Status: full code DVT prophylaxis: Lovenox Family Communication:  Disposition Plan: home when medically stable.   Consultants:  Pulmonology  Procedures:  See below for x-ray reports   Antibiotics:  Zithromax 9/1 >> 911  Vancomycin 9/2 >>911  Merrem 9/4 >>911  Cefepime 9/2 >> 9/4  augmentin 9/11>>  HPI/Subjective:  [name], still with SOB. Continue slowly improving.  Objective: Filed Vitals:   10/12/13 1400 10/12/13 1434 10/12/13 1600 10/12/13 1638  BP: 119/71  101/63   Pulse: 92  84   Temp:    97.9 F (36.6 C)  TempSrc:    Oral  Resp: 13  14   Height:      Weight:      SpO2: 96% 94% 96%     Intake/Output Summary (Last 24 hours) at 10/12/13 1846 Last data filed at 10/12/13 1730  Gross per 24 hour  Intake    640 ml  Output   3700 ml  Net  -3060 ml     Filed Weights   10/09/13 0500 10/11/13 0500 10/12/13 0500  Weight: 121 kg (266 lb 12.1 oz) 113.8 kg (250 lb 14.1 oz) 110.8 kg (244 lb 4.3 oz)    Exam:     Afebrile, [name], still with SOB. Continue improving and is requiring a little less oxygen; but with ongoing desat when removing venturi/partial rebreather.  Respiratory: fairly good air movement, no frank wheezes; positive bibasilar crackles and scattered rhonchi appreciated  Cardiovascular: regular  rate and rhythm. No murmur, rubs or gallop. No lower extremity edema. Telemetry sinus rhythm.  Abdomen:  obese, no guarding or rebound, positive BS  Musculoskeletal: No joint swelling or erythema  Psychiatric: Grossly normal mood and affect. Speech fluent and appropriate.  Neuro: non focal deficit appreciated  Data Reviewed:  Excellent urine output  Basic metabolic panel unremarkable.   Total CK normal. WBC 24.8, difficult to interpret on high-dose steroids. ESR 65.   Blood cultures no growth, final. Sputum culture pending.  Scheduled Meds: . amitriptyline  50 mg Oral QHS  . amoxicillin-clavulanate  1 tablet Oral Q12H  . antiseptic oral rinse  7 mL Mouth Rinse q12n4p  . chlorhexidine  15 mL Mouth Rinse BID  . diazepam  5 mg Oral Q8H  . enoxaparin (LOVENOX) injection  60 mg Subcutaneous Q24H  . escitalopram  20 mg Oral Daily  . gabapentin  300 mg Oral TID  . guaiFENesin  1,200 mg Oral BID  . ipratropium-albuterol  3 mL Nebulization Q6H WA  . methylPREDNISolone (SOLU-MEDROL) injection  20 mg Intravenous Q12H  . pantoprazole  40 mg Oral BID  . polyethylene glycol  17 g Oral TID  . senna  1 tablet Oral QHS  . sodium chloride  10-40 mL Intracatheter Q12H  . sucralfate  1 g Oral TID WC & HS  . zolpidem  10 mg Oral QHS   Continuous Infusions:    Principal Problem:   Acute respiratory failure with hypoxia Active Problems:   HCAP (healthcare-associated pneumonia)   Dysphagia, unspecified(787.20)   Acute kidney injury   GERD (gastroesophageal reflux disease)   OSA on CPAP   Time spent 25  minutes  Barton Dubois, MD  [facility]  Pager [phone] If 7PM-7AM, please contact night-coverage at [email or web address], password [facility] 10/06/2013, 9:53 AM  LOS: 8 days

## 2013-10-12 NOTE — Plan of Care (Signed)
Problem: Phase I Progression Outcomes Goal: Pain controlled with appropriate interventions Outcome: Not Progressing Patient requesting pain medication often.

## 2013-10-12 NOTE — Progress Notes (Signed)
CRITICAL VALUE ALERT  Critical value received:  CO2 40  Date of notification:  10/12/13  Time of notification:  0640  Critical value read back: yes  Nurse who received alert:  A. Bascom Levels RN  MD notified (1st page):  Dr. Mort Sawyers  Time of first page:  [phone]  MD notified (2nd page):  Time of second page:  Responding MD:  None at this time.  Time MD responded:  Will have 1st follow-up with MD

## 2013-10-12 NOTE — Progress Notes (Signed)
Subjective: He is awake and alert. Oxygen saturation looks pretty good until he took his monitor off. He is sitting up eating on nasal cannula now but has been on a nonrebreather mask overnight.  Objective: Vital signs in last 24 hours: Temp:  [97.8 F (36.6 C)-98.2 F (36.8 C)] 97.8 F (36.6 C) (09/14 0719) Pulse Rate:  [65-107] 68 (09/14 0719) Resp:  [7-24] 10 (09/14 0719) BP: (89-129)/(46-107) 102/88 mmHg (09/14 0700) SpO2:  [76 %-97 %] 97 % (09/14 0719) FiO2 (%):  [40 %-60 %] 40 % (09/14 0719) Weight:  [110.8 kg (244 lb 4.3 oz)] 110.8 kg (244 lb 4.3 oz) (09/14 0500) Weight change: -3 kg (-6 lb 9.8 oz) Last BM Date: 10/12/13  Intake/Output from previous day: 09/13 0701 - 09/14 0700 In: 2080 [P.O.:2040; I.V.:40] Out: 4025 [Urine:4025]  PHYSICAL EXAM General appearance: alert and mild distress Resp: rhonchi bilaterally Cardio: regular rate and rhythm, S1, S2 normal, no murmur, click, rub or gallop GI: soft, non-tender; bowel sounds normal; no masses,  no organomegaly Extremities: extremities normal, atraumatic, no cyanosis or edema  Lab Results:  Results for orders placed during the hospital encounter of 09/28/13 (from the past 48 hour(s))  BASIC METABOLIC PANEL     Status: Abnormal   Collection Time    10/12/13  5:00 AM      Result Value Ref Range   Sodium 140  137 - 147 mEq/L   Potassium 5.0  3.7 - 5.3 mEq/L   Chloride 92 (*) 96 - 112 mEq/L   CO2 40 (*) 19 - 32 mEq/L   Comment: CRITICAL RESULT CALLED TO, READ BACK BY AND VERIFIED WITH:     FRASER,A AT 6:35AM ON 10/12/13 BY FESTERMAN,C   Glucose, Bld 98  70 - 99 mg/dL   BUN 31 (*) 6 - 23 mg/dL   Creatinine, Ser 0.92  0.50 - 1.35 mg/dL   Calcium 8.6  8.4 - 10.5 mg/dL   GFR calc non Af Amer 87 (*) >90 mL/min   GFR calc Af Amer >90  >90 mL/min   Comment: (NOTE)     The eGFR has been calculated using the CKD EPI equation.     This calculation has not been validated in all clinical situations.     eGFR's persistently  <90 mL/min signify possible Chronic Kidney     Disease.   Anion gap 8  5 - 15    ABGS No results found for this basename: PHART, PCO2, PO2ART, TCO2, HCO3,  in the last 72 hours CULTURES Recent Results (from the past 240 hour(s))  CULTURE, RESPIRATORY (NON-EXPECTORATED)     Status: None   Collection Time    10/03/13  8:45 PM      Result Value Ref Range Status   Specimen Description THROAT   Final   Special Requests NONE   Final   Gram Stain     Final   Value: NO WBC SEEN     MODERATE SQUAMOUS EPITHELIAL CELLS PRESENT     FEW YEAST     FEW GRAM POSITIVE RODS     Performed at [facility]   Culture     Final   Value: MODERATE CANDIDA ALBICANS     Performed at [facility]   Report Status 10/06/2013 FINAL   Final   Studies/Results: Dg Chest Port 1 View  10/10/2013   CLINICAL DATA:  Shortness of breath.  EXAM: PORTABLE CHEST - 1 VIEW  COMPARISON:  Chest x-ray 10/06/2013.  FINDINGS:  Lung volumes are low. Diffuse interstitial and airspace disease throughout the lungs bilaterally, unchanged. No definite pleural effusions, although a trace bilateral pleural effusions are difficult to exclude. Pulmonary vasculature is completely obscured. Heart size appears borderline enlarged. Mediastinal contours are largely obscured. Multiple soft tissue anchors in the left humeral head, presumably from prior rotator cuff surgery.  IMPRESSION: 1. Slightly worsened aeration compared to the recent prior examination from 10/06/2013. Widespread interstitial and airspace disease is again concerning for potential severe pulmonary edema, inflammatory process such as ARDS, diffuse alveolar hemorrhage and/or severe multilobar pneumonia.   Electronically Signed   By: Vinnie Langton M.D.   On: 10/10/2013 12:02    Medications:  Prior to Admission:  Prescriptions prior to admission  Medication Sig Dispense Refill  . albuterol (PROVENTIL HFA;VENTOLIN HFA) 108 (90 BASE) MCG/ACT inhaler Inhale 2 puffs  into the lungs every 4 (four) hours as needed for wheezing or shortness of breath.  1 Inhaler  3  . clonazePAM (KLONOPIN) 1 MG tablet Take 1 mg by mouth daily.      . diazepam (VALIUM) 5 MG tablet Take 1 tablet (5 mg total) by mouth every 6 (six) hours as needed for muscle spasms.  10 tablet  0  . escitalopram (LEXAPRO) 20 MG tablet Take 20 mg by mouth daily.        Marland Kitchen gabapentin (NEURONTIN) 600 MG tablet Take 1,200 mg by mouth 3 (three) times daily.      Marland Kitchen ibuprofen (ADVIL,MOTRIN) 800 MG tablet Take 800 mg by mouth 2 (two) times daily as needed for moderate pain.      Marland Kitchen lisinopril-hydrochlorothiazide (PRINZIDE,ZESTORETIC) 20-12.5 MG per tablet Take 1 tablet by mouth daily.      . naproxen (NAPROSYN) 500 MG tablet Take 1 tablet (500 mg total) by mouth 2 (two) times daily with a meal.  30 tablet  0  . QUEtiapine (SEROQUEL) 100 MG tablet Take 100 mg by mouth at bedtime.      Marland Kitchen zolpidem (AMBIEN) 10 MG tablet Take 10 mg by mouth at bedtime.      Marland Kitchen levofloxacin (LEVAQUIN) 750 MG tablet Take 1 tablet (750 mg total) by mouth daily.  7 tablet  0  . oxyCODONE-acetaminophen (PERCOCET) 5-325 MG per tablet Take 1 tablet by mouth every 4 (four) hours as needed.  20 tablet  0   Scheduled: . amitriptyline  50 mg Oral QHS  . amoxicillin-clavulanate  1 tablet Oral Q12H  . antiseptic oral rinse  7 mL Mouth Rinse q12n4p  . chlorhexidine  15 mL Mouth Rinse BID  . diazepam  5 mg Oral Q8H  . enoxaparin (LOVENOX) injection  60 mg Subcutaneous Q24H  . escitalopram  20 mg Oral Daily  . furosemide  40 mg Intravenous BID  . gabapentin  300 mg Oral TID  . guaiFENesin  1,200 mg Oral BID  . ipratropium-albuterol  3 mL Nebulization Q6H WA  . methylPREDNISolone (SOLU-MEDROL) injection  20 mg Intravenous Q12H  . pantoprazole  40 mg Oral BID  . polyethylene glycol  17 g Oral TID  . senna  1 tablet Oral QHS  . sodium chloride  10-40 mL Intracatheter Q12H  . sucralfate  1 g Oral TID WC & HS  . zolpidem  10 mg Oral QHS    Continuous:  [id number], albuterol, HYDROmorphone (DILAUDID) injection, methocarbamol, ondansetron (ZOFRAN) IV, ondansetron, oxyCODONE, sodium chloride  Assesment: He was admitted with healthcare associated pneumonia with diffuse infiltrates. I think he has acute lung injury as  well. 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 He had acute injury to his kidney associated with his primary problem and he's doing a little better with that. He is still requiring high flow oxygen. Principal Problem:   Acute respiratory failure with hypoxia Active Problems:   HCAP (healthcare-associated pneumonia)   Dysphagia, unspecified(787.20)   Acute kidney injury   GERD (gastroesophageal reflux disease)   OSA on CPAP    Plan: No change in treatments    LOS: 14 days   Keilan Nichol L 10/12/2013, 8:10 AM

## 2013-10-12 NOTE — Progress Notes (Signed)
Patient complaining about pain medication frequency. MD at bedside when PO pain medication given. MD stated that if patient was still in pain, he could have Dilaudid in 2 hours. Patient has asked for pain medication x 3 in the past hour, also stating that he could have the PO and IV pain medication at the same time. Spoke with MD about situation, patient is also stating he wishes to be transferred to [facility] per RT. No new orders at this time. Will continue to monitor patient and give pain medication when appropriate and time.

## 2013-10-13 ENCOUNTER — Inpatient Hospital Stay (HOSPITAL_COMMUNITY): Payer: Medicaid Other

## 2013-10-13 LAB — BASIC METABOLIC PANEL
Anion gap: 10 (ref 5–15)
BUN: 34 mg/dL — ABNORMAL HIGH (ref 6–23)
CO2: 39 mEq/L — ABNORMAL HIGH (ref 19–32)
Calcium: 8.7 mg/dL (ref 8.4–10.5)
Chloride: 91 mEq/L — ABNORMAL LOW (ref 96–112)
Creatinine, Ser: 0.8 mg/dL (ref 0.50–1.35)
GFR calc Af Amer: >90 mL/min (ref >90)
GFR calc non Af Amer: >90 mL/min (ref >90)
Glucose, Bld: 136 mg/dL — ABNORMAL HIGH (ref 70–99)
Potassium: 4.2 mEq/L (ref 3.7–5.3)
Sodium: 140 mEq/L (ref 137–147)

## 2013-10-13 MED ORDER — FUROSEMIDE 10 MG/ML IJ SOLN
40.0000 mg | Freq: Two times a day (BID) | INTRAMUSCULAR | Status: AC
  Administered 2013-10-13 – 2013-10-15 (×4): 40 mg via INTRAVENOUS
  Filled 2013-10-13 (×4): qty 4

## 2013-10-13 MED ORDER — PRO-STAT SUGAR FREE PO LIQD
30.0000 mL | Freq: Three times a day (TID) | ORAL | Status: DC
  Administered 2013-10-13 – 2013-10-21 (×14): 30 mL via ORAL
  Administered 2013-10-22: 18:00:00 via ORAL
  Administered 2013-10-24 (×2): 30 mL via ORAL
  Filled 2013-10-13 (×28): qty 30

## 2013-10-13 MED ORDER — PREDNISONE 20 MG PO TABS
40.0000 mg | ORAL_TABLET | Freq: Every day | ORAL | Status: DC
  Administered 2013-10-14 – 2013-10-18 (×5): 40 mg via ORAL
  Filled 2013-10-13 (×5): qty 2

## 2013-10-13 NOTE — Progress Notes (Signed)
Pt repeatedly tells various healthcare team members that he was not given all of his meds and tries to obtain more. Constantly berates various team members . Patient acknowledged all medications and receipt of medications during administration AND when questioned about this after he tried to obtain more meds from various other healthcare team members... Pt is calm and cooperative and pleasant .with no complaints or concerns at this time.

## 2013-10-13 NOTE — Progress Notes (Signed)
Pt taken to radiology on venturi mask @ 50% O2. Pt became SOB when standing in radiology. Had 2-3 word dyspnea. O2 applied on 100% NRB upon arrival to ICU bed. O2 saturations currently >90%.

## 2013-10-13 NOTE — Progress Notes (Signed)
Pt acted like he did not receive his pain medicine around 1300. Another RN had given it. When informing the pt that he had received his dose at the appropriate time, he asked a visitor if he got it at the time I had stated. The patient's visitor answered that he had received the medicine. The patient instructed the family member to say that he had not received the medication.
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
## 2013-10-13 NOTE — Progress Notes (Signed)
Subjective: He says he feels in a "holding pattern". He seems angry. He says he can pay his bills. He is upset that he is still on mask oxygen. He says he's not getting help with his mental health issues. He is considering request for transfer  Objective: Vital signs in last 24 hours: Temp:  [97.2 F (36.2 C)-98.4 F (36.9 C)] 98.4 F (36.9 C) (09/15 0400) Pulse Rate:  [78-111] 80 (09/15 0600) Resp:  [10-26] 12 (09/15 0600) BP: (101-128)/(63-84) 128/83 mmHg (09/15 0400) SpO2:  [85 %-98 %] 97 % (09/15 0600) FiO2 (%):  [60 %] 60 % (09/15 0153) Weight:  [112.4 kg (247 lb 12.8 oz)] 112.4 kg (247 lb 12.8 oz) (09/15 0500) Weight change: 1.6 kg (3 lb 8.4 oz) Last BM Date: 10/12/13  Intake/Output from previous day: 09/14 0701 - 09/15 0700 In: -  Out: 2500 [Urine:2500]  PHYSICAL EXAM General appearance: alert and mild distress Resp: rhonchi bilaterally Cardio: regular rate and rhythm, S1, S2 normal, no murmur, click, rub or gallop GI: soft, non-tender; bowel sounds normal; no masses,  no organomegaly Extremities: extremities normal, atraumatic, no cyanosis or edema  Lab Results:  Results for orders placed during the hospital encounter of 09/28/13 (from the past 48 hour(s))  BASIC METABOLIC PANEL     Status: Abnormal   Collection Time    10/12/13  5:00 AM      Result Value Ref Range   Sodium 140  137 - 147 mEq/L   Potassium 5.0  3.7 - 5.3 mEq/L   Chloride 92 (*) 96 - 112 mEq/L   CO2 40 (*) 19 - 32 mEq/L   Comment: CRITICAL RESULT CALLED TO, READ BACK BY AND VERIFIED WITH:     FRASER,A AT 6:35AM ON 10/12/13 BY FESTERMAN,C   Glucose, Bld 98  70 - 99 mg/dL   BUN 31 (*) 6 - 23 mg/dL   Creatinine, Ser 0.92  0.50 - 1.35 mg/dL   Calcium 8.6  8.4 - 10.5 mg/dL   GFR calc non Af Amer 87 (*) >90 mL/min   GFR calc Af Amer >90  >90 mL/min   Comment: (NOTE)     The eGFR has been calculated using the CKD EPI equation.     This calculation has not been validated in all clinical situations.      eGFR's persistently <90 mL/min signify possible Chronic Kidney     Disease.   Anion gap 8  5 - 15  BASIC METABOLIC PANEL     Status: Abnormal   Collection Time    10/13/13  4:50 AM      Result Value Ref Range   Sodium 140  137 - 147 mEq/L   Potassium 4.2  3.7 - 5.3 mEq/L   Chloride 91 (*) 96 - 112 mEq/L   CO2 39 (*) 19 - 32 mEq/L   Glucose, Bld 136 (*) 70 - 99 mg/dL   BUN 34 (*) 6 - 23 mg/dL   Creatinine, Ser 0.80  0.50 - 1.35 mg/dL   Calcium 8.7  8.4 - 10.5 mg/dL   GFR calc non Af Amer >90  >90 mL/min   GFR calc Af Amer >90  >90 mL/min   Comment: (NOTE)     The eGFR has been calculated using the CKD EPI equation.     This calculation has not been validated in all clinical situations.     eGFR's persistently <90 mL/min signify possible Chronic Kidney     Disease.   Anion gap  10  5 - 15    ABGS No results found for this basename: PHART, PCO2, PO2ART, TCO2, HCO3,  in the last 72 hours CULTURES Recent Results (from the past 240 hour(s))  CULTURE, RESPIRATORY (NON-EXPECTORATED)     Status: None   Collection Time    10/03/13  8:45 PM      Result Value Ref Range Status   Specimen Description THROAT   Final   Special Requests NONE   Final   Gram Stain     Final   Value: NO WBC SEEN     MODERATE SQUAMOUS EPITHELIAL CELLS PRESENT     FEW YEAST     FEW GRAM POSITIVE RODS     Performed at [facility]   Culture     Final   Value: MODERATE CANDIDA ALBICANS     Performed at [facility]   Report Status 10/06/2013 FINAL   Final   Studies/Results: No results found.  Medications:  Prior to Admission:  Prescriptions prior to admission  Medication Sig Dispense Refill  . albuterol (PROVENTIL HFA;VENTOLIN HFA) 108 (90 BASE) MCG/ACT inhaler Inhale 2 puffs into the lungs every 4 (four) hours as needed for wheezing or shortness of breath.  1 Inhaler  3  . clonazePAM (KLONOPIN) 1 MG tablet Take 1 mg by mouth daily.      . diazepam (VALIUM) 5 MG tablet Take 1 tablet  (5 mg total) by mouth every 6 (six) hours as needed for muscle spasms.  10 tablet  0  . escitalopram (LEXAPRO) 20 MG tablet Take 20 mg by mouth daily.        Marland Kitchen gabapentin (NEURONTIN) 600 MG tablet Take 1,200 mg by mouth 3 (three) times daily.      Marland Kitchen ibuprofen (ADVIL,MOTRIN) 800 MG tablet Take 800 mg by mouth 2 (two) times daily as needed for moderate pain.      Marland Kitchen lisinopril-hydrochlorothiazide (PRINZIDE,ZESTORETIC) 20-12.5 MG per tablet Take 1 tablet by mouth daily.      . naproxen (NAPROSYN) 500 MG tablet Take 1 tablet (500 mg total) by mouth 2 (two) times daily with a meal.  30 tablet  0  . QUEtiapine (SEROQUEL) 100 MG tablet Take 100 mg by mouth at bedtime.      Marland Kitchen zolpidem (AMBIEN) 10 MG tablet Take 10 mg by mouth at bedtime.      Marland Kitchen levofloxacin (LEVAQUIN) 750 MG tablet Take 1 tablet (750 mg total) by mouth daily.  7 tablet  0  . oxyCODONE-acetaminophen (PERCOCET) 5-325 MG per tablet Take 1 tablet by mouth every 4 (four) hours as needed.  20 tablet  0   Scheduled: . amitriptyline  50 mg Oral QHS  . antiseptic oral rinse  7 mL Mouth Rinse q12n4p  . chlorhexidine  15 mL Mouth Rinse BID  . diazepam  5 mg Oral Q8H  . enoxaparin (LOVENOX) injection  60 mg Subcutaneous Q24H  . escitalopram  20 mg Oral Daily  . feeding supplement (PRO-STAT SUGAR FREE 64)  30 mL Oral TID WC  . gabapentin  300 mg Oral TID  . guaiFENesin  1,200 mg Oral BID  . ipratropium-albuterol  3 mL Nebulization Q6H WA  . methylPREDNISolone (SOLU-MEDROL) injection  20 mg Intravenous Q12H  . pantoprazole  40 mg Oral BID  . polyethylene glycol  17 g Oral TID  . senna  1 tablet Oral QHS  . sodium chloride  10-40 mL Intracatheter Q12H  . sucralfate  1 g Oral TID  WC & HS  . zolpidem  10 mg Oral QHS   Continuous:  HFW:[id number], albuterol, HYDROmorphone (DILAUDID) injection, methocarbamol, ondansetron (ZOFRAN) IV, ondansetron, oxyCODONE, sodium chloride  Assesment: He was admitted with acute respiratory failure with  hypoxia and healthcare associated pneumonia. He has gradually improved but is still requiring high flow oxygen. He had acute kidney injury which has largely resolved. He has severe reflux. He is known to have obstructive sleep apnea on CPAP which he does not always use. He has chronic back pain and is on chronic pain medications and has posttraumatic stress disorder and is currently not seeing a psychiatrist. He has been self-medicating. Principal Problem:   Acute respiratory failure with hypoxia Active Problems:   HCAP (healthcare-associated pneumonia)   Dysphagia, unspecified(787.20)   Acute kidney injury   GERD (gastroesophageal reflux disease)   OSA on CPAP    Plan: Chest x-ray today to see if is any change from last chest x-ray. I don't really think is anything else to add at this point except he may need treatment at a facility that can offer inpatient psychiatric care as well    LOS: 15 days   Mckinnley Cottier L 10/13/2013, 8:16 AM

## 2013-10-13 NOTE — Progress Notes (Signed)
PROGRESS NOTE  Max [name] DJM:[id number] DOB: [birth year] DOA: 09/28/2013 PCP: [name], MD  Summary: 63 year old man with history of obstructive sleep apnea, asbestos exposure presented with increasing shortness of breath and was admitted for acute hypoxic respiratory failure and pneumonia. Oxygen requirement increased and antibiotics were broadened. CT chest revealed diffuse bilateral airspace disease. He has gradually improved but is still requiring high flow oxygen. He had acute kidney injury which has largely resolved. He has severe reflux. He is known to have obstructive sleep apnea on CPAP which he does not always use. He has chronic back pain and is on chronic pain medications and has posttraumatic stress disorder and is currently not seeing a psychiatrist. He has been self-medicating himself (even inside the hospital). CXR demonstrating superimposed ARDS/vascular congestion/interstitial edema as resultant of lung injury from PNA. Antibiotics tx completed, will continue lasix, weaning steroids/oxygen supplementation.   Assessment/Plan: 1. Acute hypoxic respiratory failure secondary to HCAP. Continue to improve slowly. Continue supportive care and current treatment. Pulmonology on board, will follow recommendations. 2. Healthcare associated pneumonia. Specific etiology remains unclear but he seems to be responding to steroids suggesting an inflammatory component. ESR elevated. Viral pneumonia is as part of differential, but given findings on CXR ARDS and vascular congestion/edema appears to be lung injury from PNA and new reason for his SOB. Requiring less oxygen. Encourage CPAP use. CXR on 9/15 demonstrating improvement in aeration, interstitial changes and opacities. Will continue lasix and steroids tapering. Antibiotics treatment completed. Continue supportive care and wean oxygen as tolerated.  3. Acute kidney injury. Resolved. Secondary to poor oral intake, use of naprosyn and use of  lisinopril/hydrochlorothiazide. Will monitor (patient now on lasix IV).  4. Dysphagia. History of esophageal stricture requiring dilatation. Followed by gastroenterology in [place] but requests f/u here in [place]. CT showed thickening of distal esophageal wall. Outpatient followup recommended. Will continue PPI, continue dysphagia 3 diet and carafate 5. PTSD, depression, anxiety. Stable. May have had mild component of encephalopathy from steroids. Will continue current medication regimen. Will continue slowly weaning steroids; after discussing and educating patient about auto-medication; klonopin has been stopped and valium TID started as per last psych rec's. 6. OSA: continue CPAP QHS, and enforced usage  7. Tobacco dependence in remission 8. Constipation: continue bowel regimen 9. Chronic pain and sciatica: will continue chronic pain regimen, continue robaxin and the use of neurontin.   Code Status: full code DVT prophylaxis: Lovenox Family Communication:  Disposition Plan: home when medically stable.   Consultants:  Pulmonology  Procedures:  See below for x-ray reports   Antibiotics:  Zithromax 9/1 >> 911  Vancomycin 9/2 >>911  Merrem 9/4 >>911  Cefepime 9/2 >> 9/4  augmentin 9/11>> 9/15  HPI/Subjective:  [name], upset/angry. Overall feeling better, but still with SOB. Continue slowly improving.  Objective: Filed Vitals:   10/13/13 0500 10/13/13 0600 10/13/13 0800 10/13/13 1000  BP:   108/59   Pulse: 78 80 88   Temp:    97.5 F (36.4 C)  TempSrc:    Axillary  Resp: 11 12    Height:      Weight: 112.4 kg (247 lb 12.8 oz)     SpO2: 98% 97% 94%     Intake/Output Summary (Last 24 hours) at 10/13/13 1224 Last data filed at 10/13/13 1000  Gross per 24 hour  Intake    500 ml  Output    550 ml  Net    -50 ml     [employer]  10/11/13 0500 10/12/13 0500 10/13/13 0500  Weight: 113.8 kg (250 lb 14.1 oz) 110.8 kg (244 lb 4.3 oz) 112.4 kg (247 lb 12.8 oz)     Exam:     Afebrile, [name], still with SOB. Continue improving and is requiring a little less oxygen; but with ongoing desat when removing venturi/partial rebreather. Upset and requesting pain meds/psych meds; which are not always provided by staff the way he wanted.  Respiratory: fairly good air movement, no frank wheezes; slight bibasilar crackles and scattered rhonchi appreciated  Cardiovascular: regular rate and rhythm. No murmur, rubs or gallop. No lower extremity edema. Telemetry sinus rhythm.  Abdomen: obese, no guarding or rebound, positive BS  Musculoskeletal: No joint swelling or erythema  Psychiatric: Grossly normal mood and affect. Speech fluent and appropriate.  Neuro: non focal deficit appreciated  Data Reviewed:  Excellent urine output, normal Creatinine    Basic metabolic panel unremarkable.   Total CK normal. WBC WNL now, ESR 65.   Blood cultures no growth, final. Sputum culture no growth up to date.  CXR demonstrating lung injury and probably ARDS  Improvement and decrease oxygen requirement with use of lasix   Scheduled Meds: . amitriptyline  50 mg Oral QHS  . antiseptic oral rinse  7 mL Mouth Rinse q12n4p  . chlorhexidine  15 mL Mouth Rinse BID  . diazepam  5 mg Oral Q8H  . enoxaparin (LOVENOX) injection  60 mg Subcutaneous Q24H  . escitalopram  20 mg Oral Daily  . feeding supplement (PRO-STAT SUGAR FREE 64)  30 mL Oral TID WC  . furosemide  40 mg Intravenous BID  . gabapentin  300 mg Oral TID  . guaiFENesin  1,200 mg Oral BID  . ipratropium-albuterol  3 mL Nebulization Q6H WA  . methylPREDNISolone (SOLU-MEDROL) injection  20 mg Intravenous Q12H  . pantoprazole  40 mg Oral BID  . polyethylene glycol  17 g Oral TID  . senna  1 tablet Oral QHS  . sodium chloride  10-40 mL Intracatheter Q12H  . sucralfate  1 g Oral TID WC & HS  . zolpidem  10 mg Oral QHS   Continuous Infusions:    Principal Problem:   Acute respiratory failure with  hypoxia Active Problems:   HCAP (healthcare-associated pneumonia)   Dysphagia, unspecified(787.20)   Acute kidney injury   GERD (gastroesophageal reflux disease)   OSA on CPAP   Time spent 25  minutes  Barton Dubois, MD  [facility]  Pager [phone] If 7PM-7AM, please contact night-coverage at [email or web address], password [email or web address] 10/06/2013, 9:53 AM  LOS: 16 days

## 2013-10-14 DIAGNOSIS — S27309A Unspecified injury of lung, unspecified, initial encounter: Secondary | ICD-10-CM

## 2013-10-14 DIAGNOSIS — F4310 Post-traumatic stress disorder, unspecified: Secondary | ICD-10-CM

## 2013-10-14 LAB — BASIC METABOLIC PANEL
Anion gap: 12 (ref 5–15)
BUN: 37 mg/dL — ABNORMAL HIGH (ref 6–23)
CO2: 38 mEq/L — ABNORMAL HIGH (ref 19–32)
Calcium: 8.8 mg/dL (ref 8.4–10.5)
Chloride: 92 mEq/L — ABNORMAL LOW (ref 96–112)
Creatinine, Ser: 0.9 mg/dL (ref 0.50–1.35)
GFR calc Af Amer: >90 mL/min (ref >90)
GFR calc non Af Amer: 88 mL/min — ABNORMAL LOW (ref >90)
Glucose, Bld: 108 mg/dL — ABNORMAL HIGH (ref 70–99)
Potassium: 3.8 mEq/L (ref 3.7–5.3)
Sodium: 142 mEq/L (ref 137–147)

## 2013-10-14 MED ORDER — NYSTATIN 100000 UNIT/GM EX CREA
TOPICAL_CREAM | Freq: Two times a day (BID) | CUTANEOUS | Status: DC
  Administered 2013-10-14: 22:00:00 via TOPICAL
  Administered 2013-10-14: 1 via TOPICAL
  Administered 2013-10-15 – 2013-10-19 (×7): via TOPICAL
  Administered 2013-10-20 (×2): 1 via TOPICAL
  Administered 2013-10-21 – 2013-11-02 (×19): via TOPICAL
  Administered 2013-11-03: 1 via TOPICAL
  Administered 2013-11-05 – 2013-11-07 (×2): via TOPICAL
  Filled 2013-10-14 (×7): qty 15

## 2013-10-14 NOTE — Progress Notes (Signed)
UR chart review completed.  

## 2013-10-14 NOTE — Progress Notes (Signed)
Subjective: He is awake and alert. He says he feels a little better. Chest x-ray yesterday looked better. He is less angry and is no longer talking about transfer  Objective: Vital signs in last 24 hours: Temp:  [97.2 F (36.2 C)-98.5 F (36.9 C)] 98.1 F (36.7 C) (09/16 0806) Pulse Rate:  [43-110] 86 (09/16 0600) Resp:  [10-28] 12 (09/16 0600) BP: (84-128)/(60-91) 84/60 mmHg (09/16 0600) SpO2:  [83 %-98 %] 92 % (09/16 0728) FiO2 (%):  [55 %-100 %] 55 % (09/15 1917) Weight:  [112.4 kg (247 lb 12.8 oz)] 112.4 kg (247 lb 12.8 oz) (09/16 0500) Weight change: 0 kg (0 lb) Last BM Date: 10/12/13  Intake/Output from previous day: 09/15 0701 - 09/16 0700 In: 1340 [P.O.:1320; I.V.:20] Out: 925 [Urine:925]  PHYSICAL EXAM General appearance: alert and mild distress Resp: rhonchi bilaterally Cardio: regular rate and rhythm, S1, S2 normal, no murmur, click, rub or gallop GI: soft, non-tender; bowel sounds normal; no masses,  no organomegaly Extremities: extremities normal, atraumatic, no cyanosis or edema  Lab Results:  Results for orders placed during the hospital encounter of 09/28/13 (from the past 48 hour(s))  BASIC METABOLIC PANEL     Status: Abnormal   Collection Time    10/13/13  4:50 AM      Result Value Ref Range   Sodium 140  137 - 147 mEq/L   Potassium 4.2  3.7 - 5.3 mEq/L   Chloride 91 (*) 96 - 112 mEq/L   CO2 39 (*) 19 - 32 mEq/L   Glucose, Bld 136 (*) 70 - 99 mg/dL   BUN 34 (*) 6 - 23 mg/dL   Creatinine, Ser 0.80  0.50 - 1.35 mg/dL   Calcium 8.7  8.4 - 10.5 mg/dL   GFR calc non Af Amer >90  >90 mL/min   GFR calc Af Amer >90  >90 mL/min   Comment: (NOTE)     The eGFR has been calculated using the CKD EPI equation.     This calculation has not been validated in all clinical situations.     eGFR's persistently <90 mL/min signify possible Chronic Kidney     Disease.   Anion gap 10  5 - 15  BASIC METABOLIC PANEL     Status: Abnormal   Collection Time    10/14/13   4:30 AM      Result Value Ref Range   Sodium 142  137 - 147 mEq/L   Potassium 3.8  3.7 - 5.3 mEq/L   Chloride 92 (*) 96 - 112 mEq/L   CO2 38 (*) 19 - 32 mEq/L   Glucose, Bld 108 (*) 70 - 99 mg/dL   BUN 37 (*) 6 - 23 mg/dL   Creatinine, Ser 0.90  0.50 - 1.35 mg/dL   Calcium 8.8  8.4 - 10.5 mg/dL   GFR calc non Af Amer 88 (*) >90 mL/min   GFR calc Af Amer >90  >90 mL/min   Comment: (NOTE)     The eGFR has been calculated using the CKD EPI equation.     This calculation has not been validated in all clinical situations.     eGFR's persistently <90 mL/min signify possible Chronic Kidney     Disease.   Anion gap 12  5 - 15    ABGS No results found for this basename: PHART, PCO2, PO2ART, TCO2, HCO3,  in the last 72 hours CULTURES No results found for this or any previous visit (from the past 240 hour(s)).  Studies/Results: Dg Chest 2 View  10/13/2013   CLINICAL DATA:  Short of breath.  EXAM: CHEST  2 VIEW  COMPARISON:  10/10/2013  FINDINGS: Since prior study, lung aeration has improved, most notably on the right. There are still persistent irregular interstitial and airspace opacities.  No pneumothorax.  No pleural effusion.  Cardiac silhouette is normal in size. No mediastinal or hilar masses.  Left PICC is stable and well positioned.  IMPRESSION: 1. Improved lung aeration when compared to the prior study, most notably on the right. Some of this apparent improvement may be due to differences in lung aeration, greater currently, and technical differences, current PA and lateral versus AP on prior study. 2. No new abnormalities.   Electronically Signed   By: Lajean Manes M.D.   On: 10/13/2013 09:45    Medications:  Prior to Admission:  Prescriptions prior to admission  Medication Sig Dispense Refill  . albuterol (PROVENTIL HFA;VENTOLIN HFA) 108 (90 BASE) MCG/ACT inhaler Inhale 2 puffs into the lungs every 4 (four) hours as needed for wheezing or shortness of breath.  1 Inhaler  3  .  clonazePAM (KLONOPIN) 1 MG tablet Take 1 mg by mouth daily.      . diazepam (VALIUM) 5 MG tablet Take 1 tablet (5 mg total) by mouth every 6 (six) hours as needed for muscle spasms.  10 tablet  0  . escitalopram (LEXAPRO) 20 MG tablet Take 20 mg by mouth daily.        Marland Kitchen gabapentin (NEURONTIN) 600 MG tablet Take 1,200 mg by mouth 3 (three) times daily.      Marland Kitchen ibuprofen (ADVIL,MOTRIN) 800 MG tablet Take 800 mg by mouth 2 (two) times daily as needed for moderate pain.      Marland Kitchen lisinopril-hydrochlorothiazide (PRINZIDE,ZESTORETIC) 20-12.5 MG per tablet Take 1 tablet by mouth daily.      . naproxen (NAPROSYN) 500 MG tablet Take 1 tablet (500 mg total) by mouth 2 (two) times daily with a meal.  30 tablet  0  . QUEtiapine (SEROQUEL) 100 MG tablet Take 100 mg by mouth at bedtime.      Marland Kitchen zolpidem (AMBIEN) 10 MG tablet Take 10 mg by mouth at bedtime.      Marland Kitchen levofloxacin (LEVAQUIN) 750 MG tablet Take 1 tablet (750 mg total) by mouth daily.  7 tablet  0  . oxyCODONE-acetaminophen (PERCOCET) 5-325 MG per tablet Take 1 tablet by mouth every 4 (four) hours as needed.  20 tablet  0   Scheduled: . amitriptyline  50 mg Oral QHS  . antiseptic oral rinse  7 mL Mouth Rinse q12n4p  . chlorhexidine  15 mL Mouth Rinse BID  . diazepam  5 mg Oral Q8H  . enoxaparin (LOVENOX) injection  60 mg Subcutaneous Q24H  . escitalopram  20 mg Oral Daily  . feeding supplement (PRO-STAT SUGAR FREE 64)  30 mL Oral TID WC  . furosemide  40 mg Intravenous BID  . gabapentin  300 mg Oral TID  . guaiFENesin  1,200 mg Oral BID  . ipratropium-albuterol  3 mL Nebulization Q6H WA  . pantoprazole  40 mg Oral BID  . polyethylene glycol  17 g Oral TID  . predniSONE  40 mg Oral Q breakfast  . senna  1 tablet Oral QHS  . sodium chloride  10-40 mL Intracatheter Q12H  . sucralfate  1 g Oral TID WC & HS  . zolpidem  10 mg Oral QHS   Continuous:  PIR:[id number], albuterol, HYDROmorphone (  DILAUDID) injection, methocarbamol, ondansetron  (ZOFRAN) IV, ondansetron, oxyCODONE, sodium chloride  Assesment: He was admitted with acute respiratory failure with hypoxia and he is known to have healthcare associated pneumonia. He has some element of acute lung injury as well but is slowly improving. Principal Problem:   Acute respiratory failure with hypoxia Active Problems:   HCAP (healthcare-associated pneumonia)   Dysphagia, unspecified(787.20)   Acute kidney injury   GERD (gastroesophageal reflux disease)   OSA on CPAP    Plan: Continue current treatments    LOS: 16 days   Kathyrn Warmuth L 10/14/2013, 8:11 AM

## 2013-10-14 NOTE — Plan of Care (Signed)
Problem: Phase I Progression Outcomes Goal: Dyspnea controlled at rest Outcome: Progressing Remains unable to tolerate even minimal activity w/o becoming severely sob. Goal: Pain controlled with appropriate interventions Outcome: Not Progressing Pt is frequently call for pain control meds, even after being told exactly when next med due.

## 2013-10-14 NOTE — Progress Notes (Signed)
10/14/13 1441  Integumentary  Skin Integrity Rash  Rash Location Groin  Rash Location Orientation Right;Left  Rash Intervention Cleansed  Heather Roberts 10/14/2013 2:43 PM

## 2013-10-14 NOTE — Progress Notes (Signed)
Pt is calm and pleasant at this time with no obvious or stated distress and no complaints. Pt has received medications on request except when asleep as agreed. Pt times for next availability are listed on room board in plain sight and pt is still trying to obtain additional med doses even when so drowsy that he is unable to form a complete sentence.

## 2013-10-14 NOTE — Progress Notes (Signed)
PROGRESS NOTE  Max [name] ZOX:[id number] DOB: July 05, 1950 DOA: 09/28/2013 PCP: Dorrene German, MD  Summary: 63 year old man with history of obstructive sleep apnea, asbestos exposure presented with increasing shortness of breath and was admitted for acute hypoxic respiratory failure and pneumonia. CT chest revealed diffuse bilateral airspace disease. Other laboratory workup has been unrevealing, patient followed by pulmonology, treated with empiric antibiotics and steroids for suspected inflammatory component. Hospitalization prolonged by high oxygen requirement and suspected ALI secondary to pneumonia. Hospitalization complicated by self medication, and frequent requests for pain medication. He is slowly improving but still requires high oxygen.  Assessment/Plan: 1. Acute hypoxic respiratory failure secondary to pneumonia, ALI. Slowly improving, off NRB, on Venturi. 2. Healthcare associated pneumonia. Treated, off abx now. 3. Acute lung injury. Appears to be clinically improving. Continue steroids. 4. Acute kidney injury. Resolved. Secondary to poor oral intake, Naprosyn. Also lisinopril, hydrochlorothiazide.  5. Dysphagia. History of esophageal stricture requiring dilatation. Followed by gastroenterology in [place] but requests f/u here in [place]. CT showed thickening of distal esophageal wall. Outpatient followup recommended. 6. PTSD, depression, anxiety. On Klonopin prior to admission, prefers amitriptyline to Seroquel. Has been self-medicating in the hospital with Klonopin which has been stopped. May have had mild component of encephalopathy from steroids. 7. OSA, CPAP as outpatient with intermittent compliance. 8. Tobacco dependence in remission 9. Chronic pain, sciatica.    Overall slowly improving, plan to continue steroids, wean oxygen as tolerated. Vasculitis workup was negative.  CMP, CBC in AM  Likely transfer to floor 9/17  Code Status: full code DVT prophylaxis:  Lovenox Family Communication:  Disposition Plan: home  Brendia Sacks, MD  [facility]  Pager [phone] If 7PM-7AM, please contact night-coverage at [email or web address], password [facility] 10/14/2013, 7:47 AM  LOS: 16 days   Consultants:  Pulmonology  Procedures:   Antibiotics:  Zithromax 9/1 >> 911  Vancomycin 9/2 >>911  Merrem 9/4 >>911  Cefepime 9/2 >> 9/4  augmentin 9/11>> 9/15  HPI/Subjective: Interval history noted. The patient is slowly feeling better. Sciatica is stable.  Objective: Filed Vitals:   10/14/13 0400 10/14/13 0500 10/14/13 0600 10/14/13 0728  BP: 98/63  84/60   Pulse: 93 100 86   Temp: 98.5 F (36.9 C)     TempSrc: Axillary     Resp: Height:      Weight:  112.4 kg (247 lb 12.8 oz)    SpO2: 96% 97% 95% 92%    Intake/Output Summary (Last 24 hours) at 10/14/13 0747 Last data filed at 10/13/13 2100  Gross per 24 hour  Intake   1340 ml  Output    925 ml  Net    415 ml     Filed Weights   10/12/13 0500 10/13/13 0500 10/14/13 0500  Weight: 110.8 kg (244 lb 4.3 oz) 112.4 kg (247 lb 12.8 oz) 112.4 kg (247 lb 12.8 oz)    Exam:     Afebrile, vital signs are stable. Oxygenation stable on Venturi mask.  Gen. Appears calm, comfortable.  Psychiatric. Speech fluent and clear.  Cardiovascular regular rate and rhythm. No murmur or gallop. No lower extremity edema.  Respiratory clear to auscultation bilaterally. No wheezes, rales or rhonchi. Normal respiratory effort.   Data Reviewed:  Basic metabolic panel with hypercarbia.  Scheduled Meds: . amitriptyline  50 mg Oral QHS  . antiseptic oral rinse  7 mL Mouth Rinse q12n4p  . chlorhexidine  15 mL Mouth Rinse BID  . diazepam  5 mg Oral Q8H  .  enoxaparin (LOVENOX) injection  60 mg Subcutaneous Q24H  . escitalopram  20 mg Oral Daily  . feeding supplement (PRO-STAT SUGAR FREE 64)  30 mL Oral TID WC  . furosemide  40 mg Intravenous BID  . gabapentin  300 mg Oral TID  . guaiFENesin   1,200 mg Oral BID  . ipratropium-albuterol  3 mL Nebulization Q6H WA  . pantoprazole  40 mg Oral BID  . polyethylene glycol  17 g Oral TID  . predniSONE  40 mg Oral Q breakfast  . senna  1 tablet Oral QHS  . sodium chloride  10-40 mL Intracatheter Q12H  . sucralfate  1 g Oral TID WC & HS  . zolpidem  10 mg Oral QHS   Continuous Infusions:    Principal Problem:   Acute respiratory failure with hypoxia Active Problems:   HCAP (healthcare-associated pneumonia)   Dysphagia, unspecified(787.20)   Acute kidney injury   GERD (gastroesophageal reflux disease)   OSA on CPAP   Acute lung injury   Time spent 20 minutes

## 2013-10-14 NOTE — Progress Notes (Signed)
Pt is alert and oriented, but continues to remove his oxygenation devices. When entering the room the patient had removed his venturi mask and was saturating in the 60s. After placing it back on his face, saturations increased to 94%. Patient has been instructed to keep his mask on, but has removed it multiple times. Education provided to increase compliance, but lack of patient effort is impeding progress.

## 2013-10-15 LAB — COMPREHENSIVE METABOLIC PANEL
ALT: 45 U/L (ref 0–53)
AST: 15 U/L (ref 0–37)
Albumin: 3.1 g/dL — ABNORMAL LOW (ref 3.5–5.2)
Alkaline Phosphatase: 124 U/L — ABNORMAL HIGH (ref 39–117)
Anion gap: 11 (ref 5–15)
BUN: 37 mg/dL — ABNORMAL HIGH (ref 6–23)
CO2: 37 mEq/L — ABNORMAL HIGH (ref 19–32)
Calcium: 9.1 mg/dL (ref 8.4–10.5)
Chloride: 91 mEq/L — ABNORMAL LOW (ref 96–112)
Creatinine, Ser: 0.92 mg/dL (ref 0.50–1.35)
GFR calc Af Amer: >90 mL/min (ref >90)
GFR calc non Af Amer: 87 mL/min — ABNORMAL LOW (ref >90)
Glucose, Bld: 132 mg/dL — ABNORMAL HIGH (ref 70–99)
Potassium: 3.7 mEq/L (ref 3.7–5.3)
Sodium: 139 mEq/L (ref 137–147)
Total Bilirubin: 0.5 mg/dL (ref 0.3–1.2)
Total Protein: 7.5 g/dL (ref 6.0–8.3)

## 2013-10-15 LAB — CBC
HCT: 43.6 % (ref 39.0–52.0)
Hemoglobin: 14.3 g/dL (ref 13.0–17.0)
MCH: 32.1 pg (ref 26.0–34.0)
MCHC: 32.8 g/dL (ref 30.0–36.0)
MCV: 98 fL (ref 78.0–100.0)
Platelets: 208 10*3/uL (ref 150–400)
RBC: 4.45 MIL/uL (ref 4.22–5.81)
RDW: 13.5 % (ref 11.5–15.5)
WBC: 30.8 10*3/uL — ABNORMAL HIGH (ref 4.0–10.5)

## 2013-10-15 NOTE — Plan of Care (Signed)
Problem: Phase III Progression Outcomes Goal: O2 sats > or equal to 93% on room air Outcome: Not Progressing Pt remains unable to tolerate o2 w/o venti mask. Can not tolerate o2  Just by nasal canula Goal: Pain controlled on oral analgesia Outcome: Not Progressing Pt continues to c/o of pain at all times it is very difficult for him to await for next due dose

## 2013-10-15 NOTE — Progress Notes (Signed)
PROGRESS NOTE  Max [name] YQM:[id number] DOB: Jan 12, 1951 DOA: 09/28/2013 PCP: Dorrene German, MD  Summary: 63 year old man with history of obstructive sleep apnea, asbestos exposure presented with increasing shortness of breath and was admitted for acute hypoxic respiratory failure and pneumonia. CT chest revealed diffuse bilateral airspace disease. Other laboratory workup has been unrevealing, patient followed by pulmonology, treated with empiric antibiotics and steroids for suspected inflammatory component. Hospitalization prolonged by high oxygen requirement and suspected ALI secondary to pneumonia. Hospitalization complicated by self medication, and frequent requests for pain medication. He is slowly improving but still requires high oxygen.  Assessment/Plan: 1. Acute hypoxic respiratory failure secondary to pneumonia, ALI. Very slow to improve. 2. Healthcare associated pneumonia. Treated, off abx now. 3. Acute lung injury. No significant change in the last 24 hours, however overall is improving. 4. Acute kidney injury. Resolved. Secondary to poor oral intake, Naprosyn. Also lisinopril, hydrochlorothiazide.  5. Dysphagia. History of esophageal stricture requiring dilatation. Followed by gastroenterology in [place] but requests f/u here in [place]. CT showed thickening of distal esophageal wall. Outpatient followup recommended. 6. PTSD, depression, anxiety. He has had some difficulty with this during this hospitalization, he was self-medicating earlier with Klonopin which has stopped. 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 7. OSA, CPAP as outpatient with intermittent compliance. 8. Tobacco dependence in remission 9. Chronic pain, sciatica.    Clinically appears stable, somewhat better although he continues to have a high oxygen requirement and significant leukocytosis. However, he has no fever, remains hemodynamically stable and has no  evidence of infection. Last chest x-ray performed showed some improvement in aeration. Plan to continue steroids orally and follow clinically given overall slow improvement. Pulmonology recommendations appreciated.   Check CBC with differential the morning. Consider repeat chest imaging 9/18.  Code Status: full code DVT prophylaxis: Lovenox Family Communication:  Disposition Plan: home  Brendia Sacks, MD  [facility]  Pager [phone] If 7PM-7AM, please contact night-coverage at [email or web address], password [facility] 10/15/2013, 8:25 AM  LOS: 17 days   Consultants:  Pulmonology  Procedures:   Antibiotics:  Zithromax 9/1 >> 911  Vancomycin 9/2 >>911  Merrem 9/4 >>911  Cefepime 9/2 >> 9/4  augmentin 9/11>> 9/15  HPI/Subjective: Patient requests pain medications frequently. He is noncompliant with oxygen supplementation per nursing notes.  Today he feels much better. "I am breathing much better, I want to go home". Eating fine. Bowels moving. No new complaints.  Objective: Filed Vitals:   10/15/13 4401 10/15/13 0741 10/15/13 0743 10/15/13 0800  BP:    96/80  Pulse: 110   130  Temp:  98.4 F (36.9 C)    TempSrc:  Oral    Resp: 15   30  Height:      Weight:      SpO2: 90%  92% 90%    Intake/Output Summary (Last 24 hours) at 10/15/13 0825 Last data filed at 10/15/13 0800  Gross per 24 hour  Intake   1560 ml  Output   2475 ml  Net   -915 ml     Filed Weights   10/13/13 0500 10/14/13 0500 10/15/13 0503  Weight: 112.4 kg (247 lb 12.8 oz) 112.4 kg (247 lb 12.8 oz) 111.1 kg (244 lb 14.9 oz)    Exam:     Afebrile, vital signs are stable, continues to require high to oxygen, Venturi mask. Desaturates quickly off oxygen.  Gen. Appears calm, comfortable.  Psychiatric. Speech fluent and clear.  Cardiovascular regular rate and  rhythm. No murmur, rub or gallop. Telemetry sinus rhythm/sinus tachycardia.  Respiratory clear to auscultation bilaterally. No wheezes,  rales or rhonchi. Normal respiratory effort.  Data Reviewed:  Excellent urine output.  Basic metabolic panel with hypercarbia, otherwise unremarkable. LFTs unremarkable.  Persistent leukocytosis, somewhat elevated today without evidence of infection.  Scheduled Meds: . amitriptyline  50 mg Oral QHS  . antiseptic oral rinse  7 mL Mouth Rinse q12n4p  . chlorhexidine  15 mL Mouth Rinse BID  . diazepam  5 mg Oral Q8H  . enoxaparin (LOVENOX) injection  60 mg Subcutaneous Q24H  . escitalopram  20 mg Oral Daily  . feeding supplement (PRO-STAT SUGAR FREE 64)  30 mL Oral TID WC  . gabapentin  300 mg Oral TID  . guaiFENesin  1,200 mg Oral BID  . ipratropium-albuterol  3 mL Nebulization Q6H WA  . nystatin cream   Topical BID  . pantoprazole  40 mg Oral BID  . polyethylene glycol  17 g Oral TID  . predniSONE  40 mg Oral Q breakfast  . senna  1 tablet Oral QHS  . sodium chloride  10-40 mL Intracatheter Q12H  . sucralfate  1 g Oral TID WC & HS  . zolpidem  10 mg Oral QHS   Continuous Infusions:    Principal Problem:   Acute respiratory failure with hypoxia Active Problems:   HCAP (healthcare-associated pneumonia)   Dysphagia, unspecified(787.20)   Acute kidney injury   GERD (gastroesophageal reflux disease)   OSA on CPAP   Acute lung injury   Time spent 25 minutes

## 2013-10-15 NOTE — Progress Notes (Signed)
Subjective: I think he is overall about the same. He has no new complaints. He did wear his BiPAP for about 5 hours last night. He is now on 50% oxygen with attempts at weaning underway  Objective: Vital signs in last 24 hours: Temp:  [97.4 F (36.3 C)-98.4 F (36.9 C)] 98.4 F (36.9 C) (09/17 0741) Pulse Rate:  [94-130] 130 (09/17 0800) Resp:  [9-30] 30 (09/17 0800) BP: (89-123)/(45-105) 96/80 mmHg (09/17 0800) SpO2:  [81 %-96 %] 90 % (09/17 0800) FiO2 (%):  [45 %-50 %] 45 % (09/17 0743) Weight:  [111.1 kg (244 lb 14.9 oz)] 111.1 kg (244 lb 14.9 oz) (09/17 0503) Weight change: -1.3 kg (-2 lb 13.9 oz) Last BM Date: 10/14/13  Intake/Output from previous day: 09/16 0701 - 09/17 0700 In: 1440 [P.O.:1440] Out: 2475 [Urine:2475]  PHYSICAL EXAM General appearance: alert and mild distress Resp: Rhonchi bilaterally Cardio: regular rate and rhythm, S1, S2 normal, no murmur, click, rub or gallop GI: soft, non-tender; bowel sounds normal; no masses,  no organomegaly Extremities: extremities normal, atraumatic, no cyanosis or edema  Lab Results:  Results for orders placed during the hospital encounter of 09/28/13 (from the past 48 hour(s))  BASIC METABOLIC PANEL     Status: Abnormal   Collection Time    10/14/13  4:30 AM      Result Value Ref Range   Sodium 142  137 - 147 mEq/L   Potassium 3.8  3.7 - 5.3 mEq/L   Chloride 92 (*) 96 - 112 mEq/L   CO2 38 (*) 19 - 32 mEq/L   Glucose, Bld 108 (*) 70 - 99 mg/dL   BUN 37 (*) 6 - 23 mg/dL   Creatinine, Ser 0.90  0.50 - 1.35 mg/dL   Calcium 8.8  8.4 - 10.5 mg/dL   GFR calc non Af Amer 88 (*) >90 mL/min   GFR calc Af Amer >90  >90 mL/min   Comment: (NOTE)     The eGFR has been calculated using the CKD EPI equation.     This calculation has not been validated in all clinical situations.     eGFR's persistently <90 mL/min signify possible Chronic Kidney     Disease.   Anion gap 12  5 - 15  COMPREHENSIVE METABOLIC PANEL     Status:  Abnormal   Collection Time    10/15/13  4:07 AM      Result Value Ref Range   Sodium 139  137 - 147 mEq/L   Potassium 3.7  3.7 - 5.3 mEq/L   Chloride 91 (*) 96 - 112 mEq/L   CO2 37 (*) 19 - 32 mEq/L   Glucose, Bld 132 (*) 70 - 99 mg/dL   BUN 37 (*) 6 - 23 mg/dL   Creatinine, Ser 0.92  0.50 - 1.35 mg/dL   Calcium 9.1  8.4 - 10.5 mg/dL   Total Protein 7.5  6.0 - 8.3 g/dL   Albumin 3.1 (*) 3.5 - 5.2 g/dL   AST 15  0 - 37 U/L   ALT 45  0 - 53 U/L   Alkaline Phosphatase 124 (*) 39 - 117 U/L   Total Bilirubin 0.5  0.3 - 1.2 mg/dL   GFR calc non Af Amer 87 (*) >90 mL/min   GFR calc Af Amer >90  >90 mL/min   Comment: (NOTE)     The eGFR has been calculated using the CKD EPI equation.     This calculation has not been validated in  all clinical situations.     eGFR's persistently <90 mL/min signify possible Chronic Kidney     Disease.   Anion gap 11  5 - 15  CBC     Status: Abnormal   Collection Time    10/15/13  4:07 AM      Result Value Ref Range   WBC 30.8 (*) 4.0 - 10.5 K/uL   RBC 4.45  4.22 - 5.81 MIL/uL   Hemoglobin 14.3  13.0 - 17.0 g/dL   HCT 43.6  39.0 - 52.0 %   MCV 98.0  78.0 - 100.0 fL   MCH 32.1  26.0 - 34.0 pg   MCHC 32.8  30.0 - 36.0 g/dL   RDW 13.5  11.5 - 15.5 %   Platelets 208  150 - 400 K/uL    ABGS No results found for this basename: PHART, PCO2, PO2ART, TCO2, HCO3,  in the last 72 hours CULTURES No results found for this or any previous visit (from the past 240 hour(s)). Studies/Results: Dg Chest 2 View  10/13/2013   CLINICAL DATA:  Short of breath.  EXAM: CHEST  2 VIEW  COMPARISON:  10/10/2013  FINDINGS: Since prior study, lung aeration has improved, most notably on the right. There are still persistent irregular interstitial and airspace opacities.  No pneumothorax.  No pleural effusion.  Cardiac silhouette is normal in size. No mediastinal or hilar masses.  Left PICC is stable and well positioned.  IMPRESSION: 1. Improved lung aeration when compared to the  prior study, most notably on the right. Some of this apparent improvement may be due to differences in lung aeration, greater currently, and technical differences, current PA and lateral versus AP on prior study. 2. No new abnormalities.   Electronically Signed   By: Lajean Manes M.D.   On: 10/13/2013 09:45    Medications:  Prior to Admission:  Prescriptions prior to admission  Medication Sig Dispense Refill  . albuterol (PROVENTIL HFA;VENTOLIN HFA) 108 (90 BASE) MCG/ACT inhaler Inhale 2 puffs into the lungs every 4 (four) hours as needed for wheezing or shortness of breath.  1 Inhaler  3  . clonazePAM (KLONOPIN) 1 MG tablet Take 1 mg by mouth daily.      . diazepam (VALIUM) 5 MG tablet Take 1 tablet (5 mg total) by mouth every 6 (six) hours as needed for muscle spasms.  10 tablet  0  . escitalopram (LEXAPRO) 20 MG tablet Take 20 mg by mouth daily.        Marland Kitchen gabapentin (NEURONTIN) 600 MG tablet Take 1,200 mg by mouth 3 (three) times daily.      Marland Kitchen ibuprofen (ADVIL,MOTRIN) 800 MG tablet Take 800 mg by mouth 2 (two) times daily as needed for moderate pain.      Marland Kitchen lisinopril-hydrochlorothiazide (PRINZIDE,ZESTORETIC) 20-12.5 MG per tablet Take 1 tablet by mouth daily.      . naproxen (NAPROSYN) 500 MG tablet Take 1 tablet (500 mg total) by mouth 2 (two) times daily with a meal.  30 tablet  0  . QUEtiapine (SEROQUEL) 100 MG tablet Take 100 mg by mouth at bedtime.      Marland Kitchen zolpidem (AMBIEN) 10 MG tablet Take 10 mg by mouth at bedtime.      Marland Kitchen levofloxacin (LEVAQUIN) 750 MG tablet Take 1 tablet (750 mg total) by mouth daily.  7 tablet  0  . oxyCODONE-acetaminophen (PERCOCET) 5-325 MG per tablet Take 1 tablet by mouth every 4 (four) hours as needed.  20 tablet  0   Scheduled: . amitriptyline  50 mg Oral QHS  . antiseptic oral rinse  7 mL Mouth Rinse q12n4p  . chlorhexidine  15 mL Mouth Rinse BID  . diazepam  5 mg Oral Q8H  . enoxaparin (LOVENOX) injection  60 mg Subcutaneous Q24H  . escitalopram  20 mg  Oral Daily  . feeding supplement (PRO-STAT SUGAR FREE 64)  30 mL Oral TID WC  . gabapentin  300 mg Oral TID  . guaiFENesin  1,200 mg Oral BID  . ipratropium-albuterol  3 mL Nebulization Q6H WA  . nystatin cream   Topical BID  . pantoprazole  40 mg Oral BID  . polyethylene glycol  17 g Oral TID  . predniSONE  40 mg Oral Q breakfast  . senna  1 tablet Oral QHS  . sodium chloride  10-40 mL Intracatheter Q12H  . sucralfate  1 g Oral TID WC & HS  . zolpidem  10 mg Oral QHS   Continuous:  OFB:PZWCHENIDPOEU, albuterol, HYDROmorphone (DILAUDID) injection, methocarbamol, ondansetron (ZOFRAN) IV, ondansetron, oxyCODONE, sodium chloride  Assesment: He was admitted with acute respiratory failure with hypoxia from healthcare associated pneumonia. In addition he has acute lung injury but is slowly responding. His course is complicated by posttraumatic stress disorder and his self-medication. His white blood count has gone up but I don't see any evidence of infection at this time. Principal Problem:   Acute respiratory failure with hypoxia Active Problems:   HCAP (healthcare-associated pneumonia)   Dysphagia, unspecified(787.20)   Acute kidney injury   GERD (gastroesophageal reflux disease)   OSA on CPAP   Acute lung injury    Plan: Continue current treatments    LOS: 17 days   Takelia Urieta L 10/15/2013, 8:11 AM

## 2013-10-15 NOTE — Progress Notes (Signed)
Patient wore bipap from 2300 until 0400, then wanted it off and asked for ice cream. At 0445, attempt to wean down to 5 liters [place], patient did not tolerate, oxygen dropped to the 60's, difficult to recover. remains restless, removing mask for multiple self tasks, drinking, tooth picks, etc. At present 88-90% if he'll keep the mask on.

## 2013-10-16 LAB — CBC WITH DIFFERENTIAL/PLATELET
Basophils Absolute: 0.1 10*3/uL (ref 0.0–0.1)
Basophils Relative: 0 % (ref 0–1)
Eosinophils Absolute: 0.2 10*3/uL (ref 0.0–0.7)
Eosinophils Relative: 1 % (ref 0–5)
HCT: 40.5 % (ref 39.0–52.0)
Hemoglobin: 13.5 g/dL (ref 13.0–17.0)
Lymphocytes Relative: 15 % (ref 12–46)
Lymphs Abs: 4 10*3/uL (ref 0.7–4.0)
MCH: 32.4 pg (ref 26.0–34.0)
MCHC: 33.3 g/dL (ref 30.0–36.0)
MCV: 97.1 fL (ref 78.0–100.0)
Monocytes Absolute: 2.9 10*3/uL — ABNORMAL HIGH (ref 0.1–1.0)
Monocytes Relative: 11 % (ref 3–12)
Neutro Abs: 18.9 10*3/uL — ABNORMAL HIGH (ref 1.7–7.7)
Neutrophils Relative %: 73 % (ref 43–77)
Platelets: 191 10*3/uL (ref 150–400)
RBC: 4.17 MIL/uL — ABNORMAL LOW (ref 4.22–5.81)
RDW: 13.6 % (ref 11.5–15.5)
WBC: 26.1 10*3/uL — ABNORMAL HIGH (ref 4.0–10.5)

## 2013-10-16 LAB — BASIC METABOLIC PANEL
Anion gap: 8 (ref 5–15)
BUN: 34 mg/dL — ABNORMAL HIGH (ref 6–23)
CO2: 38 mEq/L — ABNORMAL HIGH (ref 19–32)
Calcium: 8.8 mg/dL (ref 8.4–10.5)
Chloride: 92 mEq/L — ABNORMAL LOW (ref 96–112)
Creatinine, Ser: 0.92 mg/dL (ref 0.50–1.35)
GFR calc Af Amer: >90 mL/min (ref >90)
GFR calc non Af Amer: 87 mL/min — ABNORMAL LOW (ref >90)
Glucose, Bld: 128 mg/dL — ABNORMAL HIGH (ref 70–99)
Potassium: 3.6 mEq/L — ABNORMAL LOW (ref 3.7–5.3)
Sodium: 138 mEq/L (ref 137–147)

## 2013-10-16 MED ORDER — HYDROMORPHONE HCL 1 MG/ML IJ SOLN
1.0000 mg | Freq: Four times a day (QID) | INTRAMUSCULAR | Status: DC | PRN
  Administered 2013-10-16 – 2013-10-18 (×8): 1 mg via INTRAVENOUS
  Filled 2013-10-16 (×9): qty 1

## 2013-10-16 MED ORDER — ENOXAPARIN SODIUM 40 MG/0.4ML ~~LOC~~ SOLN
40.0000 mg | SUBCUTANEOUS | Status: DC
  Administered 2013-10-17 – 2013-11-07 (×21): 40 mg via SUBCUTANEOUS
  Filled 2013-10-16 (×23): qty 0.4

## 2013-10-16 MED ORDER — POTASSIUM CHLORIDE CRYS ER 20 MEQ PO TBCR
40.0000 meq | EXTENDED_RELEASE_TABLET | Freq: Two times a day (BID) | ORAL | Status: AC
  Administered 2013-10-16 (×2): 40 meq via ORAL
  Filled 2013-10-16 (×2): qty 2

## 2013-10-16 NOTE — Progress Notes (Signed)
Pt to be transferred upstairs to room 314. Pt is alert and oriented. Assessment is unchanged from this AM. Pt to be transferred in wheelchair with venturi mask. Report given to receiving nurse. Pt is aware and agreeable of transfer. All belongings transferred with pt.

## 2013-10-16 NOTE — Progress Notes (Signed)
PROGRESS NOTE  Max [name] ZOX:[id number] DOB: 09-02-1950 DOA: 09/28/2013 PCP: Dorrene German, MD  Summary: 63 year old man with history of obstructive sleep apnea, asbestos exposure presented with increasing shortness of breath and was admitted for acute hypoxic respiratory failure and pneumonia. CT chest revealed diffuse bilateral airspace disease. Other laboratory workup has been unrevealing, patient followed by pulmonology, treated with empiric antibiotics and steroids for suspected inflammatory component. Hospitalization prolonged by high oxygen requirement and suspected ALI secondary to pneumonia. Hospitalization complicated by self medication, and frequent requests for pain medication. He is slowly improving but still requires high oxygen.  Assessment/Plan: 1. Acute hypoxic respiratory failure secondary to pneumonia, ALI. Slowly improving. Continues on steroids. 2. Healthcare associated pneumonia. Treated, off abx now. 3. Acute lung injury. Slowly improving. 4. Acute kidney injury. Resolved. Secondary to poor oral intake, Naprosyn. Also lisinopril, hydrochlorothiazide.  5. Dysphagia. History of esophageal stricture requiring dilatation. Followed by gastroenterology in [place] but requests f/u here in [place]. CT showed thickening of distal esophageal wall. Outpatient followup recommended. 6. PTSD, depression, anxiety. He has had some difficulty with this during this hospitalization, he was self-medicating earlier with Klonopin which has stopped. 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 7. OSA, CPAP as outpatient with intermittent compliance. 8. Tobacco dependence in remission 9. Chronic pain, sciatica.    Overall improving, tolerating nasal cannula. Plan to transfer to medical floor today. Anticipate discharge once he demonstrates stability on nasal cannula greater than 48 hours.  Replace potassium  Code Status: full code DVT  prophylaxis: Lovenox Family Communication:  Disposition Plan: home  Brendia Sacks, MD  [facility]  Pager [phone] If 7PM-7AM, please contact night-coverage at [email or web address], password [facility] 10/16/2013, 8:25 AM  LOS: 18 days   Consultants:  Pulmonology  Procedures:   Antibiotics:  Zithromax 9/1 >> 911  Vancomycin 9/2 >>911  Merrem 9/4 >>911  Cefepime 9/2 >> 9/4  augmentin 9/11>> 9/15  HPI/Subjective: Unable to wean to [place] yesterday.  Breathing better today. No new issues. Eating well.  Objective: Filed Vitals:   10/16/13 0400 10/16/13 0500 10/16/13 0600 10/16/13 0744  BP: 118/95  123/93   Pulse: 97  98   Temp:      TempSrc:      Resp: 13  11   Height:      Weight:  112 kg (246 lb 14.6 oz)    SpO2: 96%  95% 99%    Intake/Output Summary (Last 24 hours) at 10/16/13 0825 Last data filed at 10/15/13 2100  Gross per 24 hour  Intake   1560 ml  Output   1200 ml  Net    360 ml     Filed Weights   10/14/13 0500 10/15/13 0503 10/16/13 0500  Weight: 112.4 kg (247 lb 12.8 oz) 111.1 kg (244 lb 14.9 oz) 112 kg (246 lb 14.6 oz)    Exam:     Afebrile, VSS, sats >95% on 6L [place]  General appears calm and comforable  Alert, speech fluent and clear  Respiratory clear to auscultation bilaterally, fair air movement, no wheezes, rales or rhonchi. Moderate increased respiratory effort. Able to speak in full sentences.  Cardiovascular regular rate and rhythm. No murmur, rub or gallop. No lower extremity edema. Telemetry sinus rhythm.  Data Reviewed:  White blood cell count has decreased, 26.1. Potassium 3.6.  Scheduled Meds: . amitriptyline  50 mg Oral QHS  . antiseptic oral rinse  7 mL Mouth Rinse q12n4p  . chlorhexidine  15 mL  Mouth Rinse BID  . diazepam  5 mg Oral Q8H  . enoxaparin (LOVENOX) injection  60 mg Subcutaneous Q24H  . escitalopram  20 mg Oral Daily  . feeding supplement (PRO-STAT SUGAR FREE 64)  30 mL Oral TID WC  . gabapentin  300 mg Oral TID    . guaiFENesin  1,200 mg Oral BID  . ipratropium-albuterol  3 mL Nebulization Q6H WA  . nystatin cream   Topical BID  . pantoprazole  40 mg Oral BID  . polyethylene glycol  17 g Oral TID  . predniSONE  40 mg Oral Q breakfast  . senna  1 tablet Oral QHS  . sodium chloride  10-40 mL Intracatheter Q12H  . sucralfate  1 g Oral TID WC & HS  . zolpidem  10 mg Oral QHS   Continuous Infusions:    Principal Problem:   Acute respiratory failure with hypoxia Active Problems:   HCAP (healthcare-associated pneumonia)   Dysphagia, unspecified(787.20)   Acute kidney injury   GERD (gastroesophageal reflux disease)   OSA on CPAP   Acute lung injury   Time spent 20 minutes

## 2013-10-16 NOTE — Progress Notes (Signed)
Subjective: He says he feels about the same. He is sleepy this morning. He did use his BiPAP at least some last night. He is on 50% oxygen now with 99% oxygen saturation.  Objective: Vital signs in last 24 hours: Temp:  [96.3 F (35.7 C)-98.7 F (37.1 C)] 96.3 F (35.7 C) (09/18 0800) Pulse Rate:  [95-126] 95 (09/18 0800) Resp:  [9-35] 29 (09/18 0800) BP: (106-153)/(66-95) 112/85 mmHg (09/18 0800) SpO2:  [69 %-99 %] 93 % (09/18 0800) FiO2 (%):  [45 %-50 %] 50 % (09/18 0744) Weight:  [112 kg (246 lb 14.6 oz)] 112 kg (246 lb 14.6 oz) (09/18 0500) Weight change: 0.9 kg (1 lb 15.7 oz) Last BM Date: 10/14/13  Intake/Output from previous day: 09/17 0701 - 09/18 0700 In: 1680 [P.O.:1680] Out: 1200 [Urine:1200]  PHYSICAL EXAM General appearance: alert, cooperative and mild distress Resp: rhonchi bilaterally Cardio: regular rate and rhythm, S1, S2 normal, no murmur, click, rub or gallop GI: soft, non-tender; bowel sounds normal; no masses,  no organomegaly Extremities: extremities normal, atraumatic, no cyanosis or edema  Lab Results:  Results for orders placed during the hospital encounter of 09/28/13 (from the past 48 hour(s))  COMPREHENSIVE METABOLIC PANEL     Status: Abnormal   Collection Time    10/15/13  4:07 AM      Result Value Ref Range   Sodium 139  137 - 147 mEq/L   Potassium 3.7  3.7 - 5.3 mEq/L   Chloride 91 (*) 96 - 112 mEq/L   CO2 37 (*) 19 - 32 mEq/L   Glucose, Bld 132 (*) 70 - 99 mg/dL   BUN 37 (*) 6 - 23 mg/dL   Creatinine, Ser 0.92  0.50 - 1.35 mg/dL   Calcium 9.1  8.4 - 10.5 mg/dL   Total Protein 7.5  6.0 - 8.3 g/dL   Albumin 3.1 (*) 3.5 - 5.2 g/dL   AST 15  0 - 37 U/L   ALT 45  0 - 53 U/L   Alkaline Phosphatase 124 (*) 39 - 117 U/L   Total Bilirubin 0.5  0.3 - 1.2 mg/dL   GFR calc non Af Amer 87 (*) >90 mL/min   GFR calc Af Amer >90  >90 mL/min   Comment: (NOTE)     The eGFR has been calculated using the CKD EPI equation.     This calculation has not  been validated in all clinical situations.     eGFR's persistently <90 mL/min signify possible Chronic Kidney     Disease.   Anion gap 11  5 - 15  CBC     Status: Abnormal   Collection Time    10/15/13  4:07 AM      Result Value Ref Range   WBC 30.8 (*) 4.0 - 10.5 K/uL   RBC 4.45  4.22 - 5.81 MIL/uL   Hemoglobin 14.3  13.0 - 17.0 g/dL   HCT 43.6  39.0 - 52.0 %   MCV 98.0  78.0 - 100.0 fL   MCH 32.1  26.0 - 34.0 pg   MCHC 32.8  30.0 - 36.0 g/dL   RDW 13.5  11.5 - 15.5 %   Platelets 208  150 - 400 K/uL  BASIC METABOLIC PANEL     Status: Abnormal   Collection Time    10/16/13  7:39 AM      Result Value Ref Range   Sodium 138  137 - 147 mEq/L   Potassium 3.6 (*) 3.7 - 5.3  mEq/L   Chloride 92 (*) 96 - 112 mEq/L   CO2 38 (*) 19 - 32 mEq/L   Glucose, Bld 128 (*) 70 - 99 mg/dL   BUN 34 (*) 6 - 23 mg/dL   Creatinine, Ser 0.92  0.50 - 1.35 mg/dL   Calcium 8.8  8.4 - 10.5 mg/dL   GFR calc non Af Amer 87 (*) >90 mL/min   GFR calc Af Amer >90  >90 mL/min   Comment: (NOTE)     The eGFR has been calculated using the CKD EPI equation.     This calculation has not been validated in all clinical situations.     eGFR's persistently <90 mL/min signify possible Chronic Kidney     Disease.   Anion gap 8  5 - 15  CBC WITH DIFFERENTIAL     Status: Abnormal   Collection Time    10/16/13  7:39 AM      Result Value Ref Range   WBC 26.1 (*) 4.0 - 10.5 K/uL   RBC 4.17 (*) 4.22 - 5.81 MIL/uL   Hemoglobin 13.5  13.0 - 17.0 g/dL   HCT 40.5  39.0 - 52.0 %   MCV 97.1  78.0 - 100.0 fL   MCH 32.4  26.0 - 34.0 pg   MCHC 33.3  30.0 - 36.0 g/dL   RDW 13.6  11.5 - 15.5 %   Platelets 191  150 - 400 K/uL   Neutrophils Relative % 73  43 - 77 %   Neutro Abs 18.9 (*) 1.7 - 7.7 K/uL   Lymphocytes Relative 15  12 - 46 %   Lymphs Abs 4.0  0.7 - 4.0 K/uL   Monocytes Relative 11  3 - 12 %   Monocytes Absolute 2.9 (*) 0.1 - 1.0 K/uL   Eosinophils Relative 1  0 - 5 %   Eosinophils Absolute 0.2  0.0 - 0.7 K/uL    Basophils Relative 0  0 - 1 %   Basophils Absolute 0.1  0.0 - 0.1 K/uL    ABGS No results found for this basename: PHART, PCO2, PO2ART, TCO2, HCO3,  in the last 72 hours CULTURES No results found for this or any previous visit (from the past 240 hour(s)). Studies/Results: No results found.  Medications:  Prior to Admission:  Prescriptions prior to admission  Medication Sig Dispense Refill  . albuterol (PROVENTIL HFA;VENTOLIN HFA) 108 (90 BASE) MCG/ACT inhaler Inhale 2 puffs into the lungs every 4 (four) hours as needed for wheezing or shortness of breath.  1 Inhaler  3  . clonazePAM (KLONOPIN) 1 MG tablet Take 1 mg by mouth daily.      . diazepam (VALIUM) 5 MG tablet Take 1 tablet (5 mg total) by mouth every 6 (six) hours as needed for muscle spasms.  10 tablet  0  . escitalopram (LEXAPRO) 20 MG tablet Take 20 mg by mouth daily.        Marland Kitchen gabapentin (NEURONTIN) 600 MG tablet Take 1,200 mg by mouth 3 (three) times daily.      Marland Kitchen ibuprofen (ADVIL,MOTRIN) 800 MG tablet Take 800 mg by mouth 2 (two) times daily as needed for moderate pain.      Marland Kitchen lisinopril-hydrochlorothiazide (PRINZIDE,ZESTORETIC) 20-12.5 MG per tablet Take 1 tablet by mouth daily.      . naproxen (NAPROSYN) 500 MG tablet Take 1 tablet (500 mg total) by mouth 2 (two) times daily with a meal.  30 tablet  0  . QUEtiapine (SEROQUEL) 100 MG  tablet Take 100 mg by mouth at bedtime.      Marland Kitchen zolpidem (AMBIEN) 10 MG tablet Take 10 mg by mouth at bedtime.      Marland Kitchen levofloxacin (LEVAQUIN) 750 MG tablet Take 1 tablet (750 mg total) by mouth daily.  7 tablet  0  . oxyCODONE-acetaminophen (PERCOCET) 5-325 MG per tablet Take 1 tablet by mouth every 4 (four) hours as needed.  20 tablet  0   Scheduled: . amitriptyline  50 mg Oral QHS  . antiseptic oral rinse  7 mL Mouth Rinse q12n4p  . chlorhexidine  15 mL Mouth Rinse BID  . diazepam  5 mg Oral Q8H  . enoxaparin (LOVENOX) injection  60 mg Subcutaneous Q24H  . escitalopram  20 mg Oral  Daily  . feeding supplement (PRO-STAT SUGAR FREE 64)  30 mL Oral TID WC  . gabapentin  300 mg Oral TID  . guaiFENesin  1,200 mg Oral BID  . ipratropium-albuterol  3 mL Nebulization Q6H WA  . nystatin cream   Topical BID  . pantoprazole  40 mg Oral BID  . polyethylene glycol  17 g Oral TID  . predniSONE  40 mg Oral Q breakfast  . senna  1 tablet Oral QHS  . sodium chloride  10-40 mL Intracatheter Q12H  . sucralfate  1 g Oral TID WC & HS  . zolpidem  10 mg Oral QHS   Continuous:  ZDG:[id number], albuterol, HYDROmorphone (DILAUDID) injection, methocarbamol, ondansetron (ZOFRAN) IV, ondansetron, oxyCODONE, sodium chloride  Assesment: He was admitted with acute respiratory failure with hypoxia related to healthcare associated pneumonia. In addition to that he had acute kidney injury has chronic GERD with dysphagia and had acute lung injury from his pneumonia. He has slowly responded to antibiotic treatment and steroids. He is still requiring high flow oxygen but it has been reduced from 100% to 50% and 45% yesterday over the last several days. Principal Problem:   Acute respiratory failure with hypoxia Active Problems:   HCAP (healthcare-associated pneumonia)   Dysphagia, unspecified(787.20)   Acute kidney injury   GERD (gastroesophageal reflux disease)   OSA on CPAP   Acute lung injury    Plan: Continue attempts at reducing his oxygen requirement. I expect this will take several more days. I will be out of town until the 28th.    LOS: 18 days   Smt Lokey L 10/16/2013, 8:32 AM

## 2013-10-17 MED ORDER — ALTEPLASE 2 MG IJ SOLR
2.0000 mg | Freq: Once | INTRAMUSCULAR | Status: AC
  Administered 2013-10-17: 2 mg
  Filled 2013-10-17: qty 2

## 2013-10-17 NOTE — Progress Notes (Signed)
Stopped by to place CPAP PT already sleeping no problems noted

## 2013-10-17 NOTE — Progress Notes (Addendum)
PT decided he wanted to change his shirt ! Moves to chair and removes Venti mask; still has 6lpm/[place] in place. Saturation drops to 66 with alarms going off. PT then begin's to shout for help! PT becomes very dyspneic and begins turing blue. PT placed back on Venti mask . Oxygen saturation slowly rises back to 88-90. This is but one time of his many desaturations during one shift. Will continue to monitor. PT will need long term placement. It is doubtful his oxygen saturation will improve much more!!.

## 2013-10-17 NOTE — Progress Notes (Addendum)
Nurse called around 0500 and reported PT had been placed on BiPAP due to low oxygen saturation. PT had removed oxygen mask while asleep.

## 2013-10-17 NOTE — Progress Notes (Signed)
Inserted Anteplase 2.2cc into each port of the PICC line due to the line sluggish to flush and one line unable to flush, administered per protocol.

## 2013-10-17 NOTE — Progress Notes (Signed)
PROGRESS NOTE  EMRAN MOLZAHN ZOX:[id number] DOB: 1950-09-22 DOA: 09/28/2013 PCP: Dorrene German, MD  Summary: 63 year old man with history of obstructive sleep apnea, asbestos exposure presented with increasing shortness of breath and was admitted for acute hypoxic respiratory failure and pneumonia. CT chest revealed diffuse bilateral airspace disease. Other laboratory workup has been unrevealing, patient followed by pulmonology, treated with empiric antibiotics and steroids for suspected inflammatory component. Hospitalization prolonged by high oxygen requirement and suspected ALI secondary to pneumonia. Hospitalization complicated by self medication, and frequent requests for pain medication. He is slowly improving but still requires high oxygen.  Assessment/Plan: 1. Acute hypoxic respiratory failure secondary to pneumonia, ALI.  Very slow to improve. However, not worsening. 2. Healthcare associated pneumonia.  Treated with antibiotics.  3. Acute lung injury.  Slow to improve. 4. Acute kidney injury. Resolved. Secondary to poor oral intake, Naprosyn. Also lisinopril, hydrochlorothiazide.  5. Dysphagia. History of esophageal stricture requiring dilatation. Followed by gastroenterology in [place] but requests f/u here in [place]. CT showed thickening of distal esophageal wall. Outpatient followup recommended. 6. PTSD, depression, anxiety. He has had some difficulty with this during this hospitalization, he was self-medicating earlier with Klonopin which has stopped. 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 7. OSA, CPAP.  Continues to refuse intermittently. 8. Tobacco dependence in remission 9. Chronic pain, sciatica.    Overall stable though still needs veni-mask to maintain sats, quickly desats on [place]. Afebrile, VSS. Continue steroids, check CBC in AM, consider CXR in AM if oxygenation not improving.  Code Status: full code DVT prophylaxis:  Lovenox Family Communication:  Disposition Plan: home  Brendia Sacks, MD  [facility]  Pager [phone] If 7PM-7AM, please contact night-coverage at [email or web address], password [facility] 10/17/2013, 9:51 AM  LOS: 19 days   Consultants:  Pulmonology  Procedures:   Antibiotics:  Zithromax 9/1 >> 911  Vancomycin 9/2 >>911  Merrem 9/4 >>911  Cefepime 9/2 >> 9/4  augmentin 9/11>> 9/15  HPI/Subjective: Noted to do fairly well last night but noncompliant with CPAP.  Continues to feel better, breathing better but very SOB when going to bathroom.  Objective: Filed Vitals:   10/17/13 0500 10/17/13 0501 10/17/13 0511 10/17/13 0807  BP:  120/58    Pulse:  111 120   Temp:      TempSrc:      Resp:  33 30   Height:      Weight:      SpO2: 92% 92% 90% 79%    Intake/Output Summary (Last 24 hours) at 10/17/13 0951 Last data filed at 10/17/13 0400  Gross per 24 hour  Intake      0 ml  Output   2025 ml  Net  -2025 ml     Filed Weights   10/15/13 0503 10/16/13 0500 10/17/13 0455  Weight: 111.1 kg (244 lb 14.9 oz) 112 kg (246 lb 14.6 oz) 112.7 kg (248 lb 7.3 oz)    Exam:      afebrile, VSS  General: appears calm and comfortable, non-toxic. Currently on veni-mask  CV: RRR no m/r/g. No LE edema.  Resp: CTA bilaterally, no w/r/r. Normal resp effort.  Psych: speech fluent and clear.  Data Reviewed:   excellent UOP  Scheduled Meds: . alteplase  2 mg Intracatheter Once  . alteplase  2 mg Intracatheter Once  . amitriptyline  50 mg Oral QHS  . antiseptic oral rinse  7 mL Mouth Rinse q12n4p  . chlorhexidine  15 mL Mouth Rinse  BID  . diazepam  5 mg Oral Q8H  . enoxaparin (LOVENOX) injection  40 mg Subcutaneous Q24H  . escitalopram  20 mg Oral Daily  . feeding supplement (PRO-STAT SUGAR FREE 64)  30 mL Oral TID WC  . gabapentin  300 mg Oral TID  . guaiFENesin  1,200 mg Oral BID  . ipratropium-albuterol  3 mL Nebulization Q6H WA  . nystatin cream   Topical BID  .  pantoprazole  40 mg Oral BID  . polyethylene glycol  17 g Oral TID  . predniSONE  40 mg Oral Q breakfast  . senna  1 tablet Oral QHS  . sodium chloride  10-40 mL Intracatheter Q12H  . sucralfate  1 g Oral TID WC & HS  . zolpidem  10 mg Oral QHS   Continuous Infusions:    Principal Problem:   Acute respiratory failure with hypoxia Active Problems:   HCAP (healthcare-associated pneumonia)   Dysphagia, unspecified(787.20)   Acute kidney injury   GERD (gastroesophageal reflux disease)   OSA on CPAP   Acute lung injury   Time spent  20 minutes

## 2013-10-17 NOTE — Progress Notes (Addendum)
PT is still asleep and appears ok. He for the most part early in the shift had been arguing and complaining.  At times tonight it was difficult to tell exactly what was going on with him.Beginning of shift he appeared drugged and sleepy then later just angry. He stated around 2130 he always wore his CPAP. This is clearly not the case as I have had numerous times he would not wear it. Will continue to monitor and place on CPAP if needed or if he request. So far he has done well on just oxygen 50% Venti mask.Marland Kitchen

## 2013-10-18 DIAGNOSIS — G894 Chronic pain syndrome: Secondary | ICD-10-CM

## 2013-10-18 LAB — CBC
HCT: 36.2 % — ABNORMAL LOW (ref 39.0–52.0)
Hemoglobin: 12.1 g/dL — ABNORMAL LOW (ref 13.0–17.0)
MCH: 32.9 pg (ref 26.0–34.0)
MCHC: 33.4 g/dL (ref 30.0–36.0)
MCV: 98.4 fL (ref 78.0–100.0)
Platelets: 187 10*3/uL (ref 150–400)
RBC: 3.68 MIL/uL — ABNORMAL LOW (ref 4.22–5.81)
RDW: 13.7 % (ref 11.5–15.5)
WBC: 23.2 10*3/uL — ABNORMAL HIGH (ref 4.0–10.5)

## 2013-10-18 MED ORDER — PREDNISONE 20 MG PO TABS
30.0000 mg | ORAL_TABLET | Freq: Every day | ORAL | Status: DC
  Administered 2013-10-19: 30 mg via ORAL
  Filled 2013-10-18 (×3): qty 1

## 2013-10-18 MED ORDER — AMITRIPTYLINE HCL 50 MG PO TABS
50.0000 mg | ORAL_TABLET | Freq: Every evening | ORAL | Status: DC | PRN
  Administered 2013-10-18 – 2013-10-27 (×5): 50 mg via ORAL
  Filled 2013-10-18 (×3): qty 1
  Filled 2013-10-18: qty 2

## 2013-10-18 MED ORDER — OXYCODONE HCL 5 MG PO TABS
15.0000 mg | ORAL_TABLET | Freq: Four times a day (QID) | ORAL | Status: DC | PRN
Start: 2013-10-18 — End: 2013-10-19
  Administered 2013-10-18 – 2013-10-19 (×3): 15 mg via ORAL
  Filled 2013-10-18 (×3): qty 3

## 2013-10-18 MED ORDER — HYDROMORPHONE HCL 1 MG/ML IJ SOLN
1.0000 mg | Freq: Four times a day (QID) | INTRAMUSCULAR | Status: DC | PRN
  Administered 2013-10-18 – 2013-10-22 (×12): 1 mg via INTRAVENOUS
  Filled 2013-10-18 (×12): qty 1

## 2013-10-18 NOTE — Progress Notes (Signed)
PT moved back to ICU, with low oxygen saturations . This problem has not resolved in the last 20 days or improved to any degree. He has been on at least 55% Venti mask since his arrival. When he does wear the CPAP/BIPAP its because of low oxygen saturations; the goal was and still is  reducing the high oxygen requirement to keep his oxygen saturation above 90. Respiratory has been unable though many attempts made to accomplish this goal.

## 2013-10-18 NOTE — Progress Notes (Signed)
Patient was transported to ICU, RT placed on BIPAP 14/8 and 45% FIO2. Patient appears to be tolerating at this time. RT will continue to monitor

## 2013-10-18 NOTE — Progress Notes (Signed)
PROGRESS NOTE  Max [name] ZOX:[id number] DOB: 1950/01/30 DOA: 09/28/2013 PCP: Dorrene German, MD  Summary: 63 year old man with history of obstructive sleep apnea, asbestos exposure presented with increasing shortness of breath and was admitted for acute hypoxic respiratory failure and pneumonia. CT chest revealed diffuse bilateral airspace disease. Other laboratory workup has been unrevealing, patient followed by pulmonology, treated with empiric antibiotics and steroids for suspected inflammatory component. Hospitalization prolonged by high oxygen requirement and suspected ALI secondary to pneumonia. Hospitalization complicated by self medication, and frequent requests for pain medication. He is slowly improving but still requires high oxygen.  Assessment/Plan: 1. Acute hypoxic respiratory failure secondary to pneumonia, ALI.  Continues to require high flow oxygen, very slow to improve. Appears to have plateaued. 2. Healthcare associated pneumonia. Treated with antibiotics.  3. Acute lung injury. Continues to require high flow oxygen but stable. 4. Acute kidney injury. Resolved. Secondary to poor oral intake, Naprosyn. Also lisinopril, hydrochlorothiazide.  5. Dysphagia. History of esophageal stricture requiring dilatation. Followed by gastroenterology in [place] but requests f/u here in [place]. CT showed thickening of distal esophageal wall. Outpatient followup recommended. 6. PTSD, depression, anxiety.  Stable at this point. Earlier in the admission he was self administering Klonopin. 7. OSA, CPAP.  Continues to refuse intermittently. 8. Tobacco dependence in remission 9. Chronic pain, sciatica.     No clinical change for the last 48 hours, although stable and nontoxic he nevertheless continues to require Venturi mask oxygenation and quickly desaturates office. Compliance has continued to be an issue. He remains afebrile and leukocytosis is improving.  Plan repeat chest x-ray  tomorrow, discussed with pulmonology for further recommendations.  Start slow steroid wean.  Code Status: full code DVT prophylaxis: Lovenox Family Communication:  Disposition Plan: home  Brendia Sacks, MD  [facility]  Pager [phone] If 7PM-7AM, please contact night-coverage at [email or web address], password [facility] 10/18/2013, 3:32 PM  LOS: 20 days   Consultants:  Pulmonology  Procedures:   Antibiotics:  Zithromax 9/1 >> 911  Vancomycin 9/2 >>911  Merrem 9/4 >>911  Cefepime 9/2 >> 9/4  augmentin 9/11>> 9/15  HPI/Subjective: Compliance with oxygenation remains poor. Continues to require Ventimask.  Continues to feel better, breathing better. Does get very SOB when off mask. Main complaints are long-standing dysphagia and some sciatica.   Objective: Filed Vitals:   10/18/13 0157 10/18/13 0202 10/18/13 0620 10/18/13 1521  BP:   117/81 104/67  Pulse:  88 96 87  Temp:   97.9 F (36.6 C) 97.9 F (36.6 C)  TempSrc:   Oral Oral  Resp:  Height:      Weight:   110.3 kg (243 lb 2.7 oz)   SpO2: 97% 97% 99%     Intake/Output Summary (Last 24 hours) at 10/18/13 1532 Last data filed at 10/18/13 1018  Gross per 24 hour  Intake    970 ml  Output    650 ml  Net    320 ml     Filed Weights   10/16/13 0500 10/17/13 0455 10/18/13 0620  Weight: 112 kg (246 lb 14.6 oz) 112.7 kg (248 lb 7.3 oz) 110.3 kg (243 lb 2.7 oz)    Exam:     Afebrile, vital signs stable.  Appears calm, comfortable. Nontoxic.  Respiratory with fair air movement. No wheezes or rhonchi. A few posterior rales. Moderate increased respiratory effort without change. Able to speak in full sentences.  Cardiovascular regular rate and rhythm. No murmur, or gallop. No lower extremity edema.  Psychiatric. Grossly normal mood and affect. Speech fluent and appropriate. Able to speak at length in paragraphs without difficulty.  Data Reviewed:   WBC trending down 26.1 >> 23.2.  Scheduled  Meds: . amitriptyline  50 mg Oral QHS  . antiseptic oral rinse  7 mL Mouth Rinse q12n4p  . chlorhexidine  15 mL Mouth Rinse BID  . diazepam  5 mg Oral Q8H  . enoxaparin (LOVENOX) injection  40 mg Subcutaneous Q24H  . escitalopram  20 mg Oral Daily  . feeding supplement (PRO-STAT SUGAR FREE 64)  30 mL Oral TID WC  . gabapentin  300 mg Oral TID  . guaiFENesin  1,200 mg Oral BID  . ipratropium-albuterol  3 mL Nebulization Q6H WA  . nystatin cream   Topical BID  . pantoprazole  40 mg Oral BID  . polyethylene glycol  17 g Oral TID  . predniSONE  40 mg Oral Q breakfast  . senna  1 tablet Oral QHS  . sodium chloride  10-40 mL Intracatheter Q12H  . sucralfate  1 g Oral TID WC & HS  . zolpidem  10 mg Oral QHS   Continuous Infusions:    Principal Problem:   Acute respiratory failure with hypoxia Active Problems:   HCAP (healthcare-associated pneumonia)   Dysphagia, unspecified(787.20)   Acute kidney injury   GERD (gastroesophageal reflux disease)   OSA on CPAP   Acute lung injury   Time spent 20 minutes

## 2013-10-18 NOTE — Progress Notes (Addendum)
CTSP for hypoxia. Patient had pulled mask off. Sats improved with reapplication.  Patient c/o sciatica  On exam  Awake, alert, appears ill Eyes: PERRL Resp: fair air movement, no w/r/r/. Increased resp effort, speaks in short sentences CV: RRR no m/r/g. No LE edema. Psych: Speech fluent and clear, oriented to self, location, month Musculoskeletal: moves all extremities to command, normal tone and strength all extremities. No focal deficits.  A/P Profound hypoxia secondary to self-removal of venturi mask. ALI S/p treatment for pneumonia  Transfer to SDU BiPAP, NPO, stop pain meds, anxiolytics Check ABG May need intubation if fails to respond Stat CXR  Addendum 1820 Back to baseline, awake alert, moves all extremities, asking for pain medication Sat 94%, normal RR Plan short term BiPAP, cancel ABG and CXR. No indication for intubation. Hold pain meds and anxiolytics short term but will need to restart in near future.  Brendia Sacks, MD [facility] [phone]

## 2013-10-18 NOTE — Progress Notes (Signed)
Transferred pt to ICU report given to [place], [place].

## 2013-10-18 NOTE — Progress Notes (Signed)
Pt removing mask repeatedly throughout the day to talk, eat, and drink.  O2 sat drop in to 40's after a few minutes.  Pt repeatedly asked to keep mask on or hold it close to face whilst eating and drinking.  Pt non-compliant and takes mask off. O2 at 6l/min [place] and 50% venti mask.  Pt being monitored by central monitoring.  Will continue to teach and monitor pt.

## 2013-10-18 NOTE — Progress Notes (Signed)
Found pt lying on the bed with venti-mask off, o2 sat 39%, pt unaware mask was off.  Placed mask on pt and O2 sat rose to 88% after a few minutes.  Pt appears sleepy. Pt follows commands and states pain 10/10 on pain scale. Notified Dr Irene Limbo of pt state.  Received order to hold pain meds until he can see him.

## 2013-10-19 ENCOUNTER — Inpatient Hospital Stay (HOSPITAL_COMMUNITY): Payer: Medicaid Other

## 2013-10-19 DIAGNOSIS — Z5189 Encounter for other specified aftercare: Secondary | ICD-10-CM

## 2013-10-19 DIAGNOSIS — J9589 Other postprocedural complications and disorders of respiratory system, not elsewhere classified: Secondary | ICD-10-CM

## 2013-10-19 DIAGNOSIS — S27309A Unspecified injury of lung, unspecified, initial encounter: Secondary | ICD-10-CM

## 2013-10-19 DIAGNOSIS — J9600 Acute respiratory failure, unspecified whether with hypoxia or hypercapnia: Secondary | ICD-10-CM

## 2013-10-19 DIAGNOSIS — J80 Acute respiratory distress syndrome: Secondary | ICD-10-CM

## 2013-10-19 LAB — BASIC METABOLIC PANEL
Anion gap: 9 (ref 5–15)
BUN: 20 mg/dL (ref 6–23)
CO2: 35 mEq/L — ABNORMAL HIGH (ref 19–32)
Calcium: 8.7 mg/dL (ref 8.4–10.5)
Chloride: 97 mEq/L (ref 96–112)
Creatinine, Ser: 0.88 mg/dL (ref 0.50–1.35)
GFR calc Af Amer: >90 mL/min (ref >90)
GFR calc non Af Amer: 89 mL/min — ABNORMAL LOW (ref >90)
Glucose, Bld: 142 mg/dL — ABNORMAL HIGH (ref 70–99)
Potassium: 3.5 mEq/L — ABNORMAL LOW (ref 3.7–5.3)
Sodium: 141 mEq/L (ref 137–147)

## 2013-10-19 LAB — CBC
HCT: 33.1 % — ABNORMAL LOW (ref 39.0–52.0)
Hemoglobin: 10.6 g/dL — ABNORMAL LOW (ref 13.0–17.0)
MCH: 31.9 pg (ref 26.0–34.0)
MCHC: 32 g/dL (ref 30.0–36.0)
MCV: 99.7 fL (ref 78.0–100.0)
Platelets: 169 10*3/uL (ref 150–400)
RBC: 3.32 MIL/uL — ABNORMAL LOW (ref 4.22–5.81)
RDW: 13 % (ref 11.5–15.5)
WBC: 18.4 10*3/uL — ABNORMAL HIGH (ref 4.0–10.5)

## 2013-10-19 MED ORDER — DIAZEPAM 5 MG PO TABS
5.0000 mg | ORAL_TABLET | Freq: Four times a day (QID) | ORAL | Status: DC | PRN
  Administered 2013-10-19 – 2013-10-21 (×6): 5 mg via ORAL
  Filled 2013-10-19 (×6): qty 1

## 2013-10-19 MED ORDER — SODIUM CHLORIDE 0.9 % IJ SOLN
INTRAMUSCULAR | Status: AC
  Filled 2013-10-19: qty 500

## 2013-10-19 MED ORDER — OXYCODONE-ACETAMINOPHEN 5-325 MG PO TABS
1.0000 | ORAL_TABLET | Freq: Four times a day (QID) | ORAL | Status: DC | PRN
  Administered 2013-10-19: 2 via ORAL
  Administered 2013-10-20: 1 via ORAL
  Administered 2013-10-20 – 2013-10-22 (×6): 2 via ORAL
  Filled 2013-10-19 (×3): qty 2
  Filled 2013-10-19: qty 1
  Filled 2013-10-19 (×10): qty 2

## 2013-10-19 MED ORDER — IOHEXOL 300 MG/ML  SOLN
80.0000 mL | Freq: Once | INTRAMUSCULAR | Status: AC | PRN
  Administered 2013-10-19: 80 mL via INTRAVENOUS

## 2013-10-19 MED ORDER — PANTOPRAZOLE SODIUM 40 MG IV SOLR
40.0000 mg | Freq: Two times a day (BID) | INTRAVENOUS | Status: DC
  Administered 2013-10-20 (×2): 40 mg via INTRAVENOUS
  Filled 2013-10-19 (×3): qty 40

## 2013-10-19 MED ORDER — ACETAMINOPHEN 650 MG RE SUPP: 650.0000 mg | Freq: Four times a day (QID) | RECTAL | Status: DC | PRN

## 2013-10-19 MED ORDER — METHYLPREDNISOLONE SODIUM SUCC 125 MG IJ SOLR
125.0000 mg | Freq: Four times a day (QID) | INTRAMUSCULAR | Status: AC
  Administered 2013-10-20 (×3): 125 mg via INTRAVENOUS
  Filled 2013-10-19 (×3): qty 2

## 2013-10-19 MED ORDER — SODIUM CHLORIDE 0.9 % IJ SOLN
INTRAMUSCULAR | Status: AC
  Filled 2013-10-19: qty 45

## 2013-10-19 NOTE — Progress Notes (Signed)
UR chart review completed.  
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
## 2013-10-19 NOTE — Progress Notes (Signed)
Patient back off BiPAP on venti-mask ,and nasal cannula still requiring 55% oxygen.

## 2013-10-19 NOTE — Progress Notes (Signed)
Patient transferred to [facility] 2 Heart room 25, via care link, report called to [facility].  Vitals stable for this patient at time of transport.  Patient requested pain management at time of transport, Dilaudid  IV given and Valium  PO per order. Pain rated 9 at that time.

## 2013-10-19 NOTE — Progress Notes (Signed)
PROGRESS NOTE  Max [name] ZOX:[id number] DOB: 28-Apr-1950 DOA: 09/28/2013 PCP: Dorrene German, MD  Summary: 63 year old man with history of obstructive sleep apnea, asbestos exposure presented with increasing shortness of breath and was admitted for acute hypoxic respiratory failure and pneumonia. CT chest revealed diffuse bilateral airspace disease with broad differential. Treated for HCAP. Other laboratory workup has been unrevealing including HIV and vasculitis panel. Patient followed by pulmonology, treated with empiric antibiotics and steroids for suspected inflammatory component. Greatest improvement came after initiation of steroids. Hospitalization prolonged by high oxygen requirement and suspected ALI secondary to pneumonia. Subsequent CXR with some improvement, hemodynamics stable but unable to wean off ventimask. Remains afebrile, WBC trending down. Because of failure to improve plan transfer to [facility].  Assessment/Plan: 1. Acute hypoxic respiratory failure secondary to pneumonia, ALI. Course has waxed and waned but improved no further last 4 days. Remains stable on ventimask. 2. Healthcare associated pneumonia. Treated with antibiotics.  3. Acute lung injury. Stable. 4. Acute kidney injury. Resolved. Secondary to poor oral intake, Naprosyn. Also lisinopril, hydrochlorothiazide.  5. Dysphagia. History of esophageal stricture requiring dilatation. Followed by gastroenterology in [place] but requests f/u here in [place]. CT showed thickening of distal esophageal wall. Outpatient followup recommended. 6. PTSD, depression, anxiety.  Stable at this point. Earlier in the admission he was self administering Klonopin. 7. OSA, CPAP.  8. Tobacco dependence in remission 9. Chronic pain, sciatica.  Stable.   Plan continue steroids at current dose  Discussed with patient, because of failure to improve I recommend transfer to higher level of care, he prefers [facility].  Discussed with Dr.  Beatriz Stallion SDU, thoracic consult for consideration of VATS.   Discussed with Dr. Domenick Gong team will see in consult. Plan repeat CT chest to f/u.  Discussed with Dr. Sharon Seller [facility] 1 accepting physician.  Code Status: full code DVT prophylaxis: Lovenox Family Communication:  Disposition Plan: home  Brendia Sacks, MD  [facility]  Pager [phone] If 7PM-7AM, please contact night-coverage at [email or web address], password [facility] 10/19/2013, 8:40 AM  LOS: 21 days   Consultants:  Pulmonology  Procedures:   Antibiotics:  Zithromax 9/1 >> 911  Vancomycin 9/2 >>911  Merrem 9/4 >>911  Cefepime 9/2 >> 9/4  augmentin 9/11>> 9/15  HPI/Subjective: Much better after BiPAP last night. Breathing fine, no complaints except chronic pain.   Objective: Filed Vitals:   10/19/13 0600 10/19/13 0700 10/19/13 0737 10/19/13 0756  BP: 110/44 104/61    Pulse: 73 73    Temp:    97.9 F (36.6 C)  TempSrc:    Axillary  Resp: 16 15    Height:      Weight:      SpO2: 96% 96% 93%     Intake/Output Summary (Last 24 hours) at 10/19/13 0840 Last data filed at 10/18/13 2200  Gross per 24 hour  Intake    480 ml  Output    450 ml  Net     30 ml     Filed Weights   10/17/13 0455 10/18/13 0620 10/19/13 0500  Weight: 112.7 kg (248 lb 7.3 oz) 110.3 kg (243 lb 2.7 oz) 109.2 kg (240 lb 11.9 oz)    Exam:     Remains afebrile, vital signs are stable. On venimask   Gen. Appears calm, comfortable. Nontoxic.  Psychiatric. Alert. Speech fluent and clear.   Cardiovascular regular rate and rhythm. No murmur, rub or gallop. No lower extremity edema.  Respiratory. Fair air movement. No frank wheezes, rales or rhonchi. Mild  increased respiratory effort. Speak in full sentences.  Moves all extremities well.  Data Reviewed:   WBC continues to decrease 26.1 >> 23.2 >> 18.4  K+ 3.5, BMP otherwise stable  Scheduled Meds: . antiseptic oral rinse  7 mL Mouth Rinse q12n4p  . chlorhexidine  15  mL Mouth Rinse BID  . enoxaparin (LOVENOX) injection  40 mg Subcutaneous Q24H  . escitalopram  20 mg Oral Daily  . feeding supplement (PRO-STAT SUGAR FREE 64)  30 mL Oral TID WC  . guaiFENesin  1,200 mg Oral BID  . ipratropium-albuterol  3 mL Nebulization Q6H WA  . nystatin cream   Topical BID  . pantoprazole  40 mg Oral BID  . polyethylene glycol  17 g Oral TID  . predniSONE  30 mg Oral Q breakfast  . senna  1 tablet Oral QHS  . sodium chloride  10-40 mL Intracatheter Q12H  . sucralfate  1 g Oral TID WC & HS  . zolpidem  10 mg Oral QHS   Continuous Infusions:    Principal Problem:   Acute respiratory failure with hypoxia Active Problems:   HCAP (healthcare-associated pneumonia)   Dysphagia, unspecified(787.20)   Acute kidney injury   GERD (gastroesophageal reflux disease)   OSA on CPAP   Acute lung injury   Time spent 50 minutes counseling and coordination of care

## 2013-10-20 DIAGNOSIS — J984 Other disorders of lung: Secondary | ICD-10-CM

## 2013-10-20 LAB — CBC
HCT: 34.2 % — ABNORMAL LOW (ref 39.0–52.0)
Hemoglobin: 11 g/dL — ABNORMAL LOW (ref 13.0–17.0)
MCH: 31.5 pg (ref 26.0–34.0)
MCHC: 32.2 g/dL (ref 30.0–36.0)
MCV: 98 fL (ref 78.0–100.0)
Platelets: 185 10*3/uL (ref 150–400)
RBC: 3.49 MIL/uL — ABNORMAL LOW (ref 4.22–5.81)
RDW: 13.6 % (ref 11.5–15.5)
WBC: 13.1 10*3/uL — ABNORMAL HIGH (ref 4.0–10.5)

## 2013-10-20 LAB — RESPIRATORY VIRUS PANEL
Adenovirus: NOT DETECTED
Influenza A H1: NOT DETECTED
Influenza A H3: NOT DETECTED
Influenza A: NOT DETECTED
Influenza B: NOT DETECTED
Metapneumovirus: NOT DETECTED
Parainfluenza 1: NOT DETECTED
Parainfluenza 2: NOT DETECTED
Parainfluenza 3: NOT DETECTED
Respiratory Syncytial Virus A: NOT DETECTED
Respiratory Syncytial Virus B: NOT DETECTED
Rhinovirus: NOT DETECTED

## 2013-10-20 LAB — BASIC METABOLIC PANEL
Anion gap: 10 (ref 5–15)
BUN: 19 mg/dL (ref 6–23)
CO2: 33 mEq/L — ABNORMAL HIGH (ref 19–32)
Calcium: 8.8 mg/dL (ref 8.4–10.5)
Chloride: 96 mEq/L (ref 96–112)
Creatinine, Ser: 0.8 mg/dL (ref 0.50–1.35)
GFR calc Af Amer: >90 mL/min (ref >90)
GFR calc non Af Amer: >90 mL/min (ref >90)
Glucose, Bld: 145 mg/dL — ABNORMAL HIGH (ref 70–99)
Potassium: 4.2 mEq/L (ref 3.7–5.3)
Sodium: 139 mEq/L (ref 137–147)

## 2013-10-20 LAB — ABO/RH: ABO/RH(D): O POS

## 2013-10-20 LAB — MRSA PCR SCREENING: MRSA by PCR: NEGATIVE

## 2013-10-20 LAB — SURGICAL PCR SCREEN
MRSA, PCR: NEGATIVE
Staphylococcus aureus: NEGATIVE

## 2013-10-20 LAB — APTT: aPTT: 31 seconds (ref 24–37)

## 2013-10-20 MED ORDER — PANTOPRAZOLE SODIUM 40 MG PO TBEC
40.0000 mg | DELAYED_RELEASE_TABLET | Freq: Two times a day (BID) | ORAL | Status: DC
  Administered 2013-10-20 – 2013-10-22 (×4): 40 mg via ORAL
  Filled 2013-10-20 (×4): qty 1

## 2013-10-20 MED ORDER — METHYLPREDNISOLONE SODIUM SUCC 125 MG IJ SOLR
125.0000 mg | Freq: Four times a day (QID) | INTRAMUSCULAR | Status: AC
  Administered 2013-10-20 – 2013-10-22 (×8): 125 mg via INTRAVENOUS
  Filled 2013-10-20 (×9): qty 2

## 2013-10-20 MED ORDER — ENSURE COMPLETE PO LIQD
237.0000 mL | Freq: Two times a day (BID) | ORAL | Status: DC
  Administered 2013-10-20 – 2013-10-24 (×4): 237 mL via ORAL
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
## 2013-10-20 NOTE — Progress Notes (Signed)
NUTRITION FOLLOW-UP  DOCUMENTATION CODES Per approved criteria  -Obesity Unspecified   INTERVENTION: ProStat 30 ml TID (each 30 ml provides 100 kcal, 15 gr protein)   Ensure Complete po BID, each supplement provides 350 kcal and 13 grams of protein  NUTRITION DIAGNOSIS: Increased nutrient needs related to acute hypoxic respiratory failure as evidenced by guidelines for estimated nutrition need; ongoing.   Goal: Pt to meet >/= 90% of their estimated nutrition needs, not met.   Monitor: Po intake, labs and wt trends    ASSESSMENT: Admission diagnosis healthcare associated pneumonia and acute hypoxic respiratory failure. He is slowly improving per MD. His hx includes chronic pain, PTSD, constipation and GERD.  Diet advanced to Dysphagia 3 on 9/8.   Per pt he has been consuming about 50% of his meals. Pt is willing to try ensure to meet kcal needs.   Height: Ht Readings from Last 1 Encounters:  09/29/13 5' 11"  (1.803 m)    Weight: Wt Readings from Last 1 Encounters:  10/20/13 238 lb 12.1 oz (108.3 kg)  Weight variable   BMI:  Body mass index is 33.31 kg/(m^2). obesity class I  Estimated Nutritional Needs: Kcal: 2595-6387 Protein: 117-140 gr Fluid: 1.7-2.0 liters daily  Skin: No issues noted   Diet Order: Dysphagia 3 thin liquids     Intake/Output Summary (Last 24 hours) at 10/20/13 1052 Last data filed at 10/20/13 0300  Gross per 24 hour  Intake    720 ml  Output    500 ml  Net    220 ml    Last BM: 9/22  Labs:   Recent Labs Lab 10/16/13 0739 10/19/13 0521 10/20/13 0630  NA 138 141 139  K 3.6* 3.5* 4.2  CL 92* 97 96  CO2 38* 35* 33*  BUN 34* 20 19  CREATININE 0.92 0.88 0.80  CALCIUM 8.8 8.7 8.8  GLUCOSE 128* 142* 145*    CBG (last 3)  No results found for this basename: GLUCAP,  in the last 72 hours  Scheduled Meds: . antiseptic oral rinse  7 mL Mouth Rinse q12n4p  . chlorhexidine  15 mL Mouth Rinse BID  . enoxaparin (LOVENOX) injection   40 mg Subcutaneous Q24H  . escitalopram  20 mg Oral Daily  . feeding supplement (PRO-STAT SUGAR FREE 64)  30 mL Oral TID WC  . guaiFENesin  1,200 mg Oral BID  . ipratropium-albuterol  3 mL Nebulization Q6H WA  . nystatin cream   Topical BID  . pantoprazole (PROTONIX) IV  40 mg Intravenous Q12H  . polyethylene glycol  17 g Oral TID  . senna  1 tablet Oral QHS  . sodium chloride  10-40 mL Intracatheter Q12H  . sucralfate  1 g Oral TID WC & HS  . zolpidem  10 mg Oral QHS    Continuous Infusions:  Maylon Peppers RD, LDN, CNSC [phone] Pager [phone] After Hours Pager

## 2013-10-20 NOTE — Progress Notes (Signed)
PROGRESS NOTE  Max [name] WGN:[id number] DOB: Aug 09, 1950 DOA: 09/28/2013 PCP: Dorrene German, MD  Summary: 63 year old man with history of obstructive sleep apnea, asbestos exposure presented with increasing shortness of breath and was admitted for acute hypoxic respiratory failure and pneumonia 09/27/13 at [facility]. CT chest revealed diffuse bilateral airspace disease with broad differential. Treated for HCAP. Other laboratory workup has been unrevealing including HIV and vasculitis panel. Patient followed by pulmonology, treated with empiric antibiotics and steroids added 10/02/13 for suspected inflammatory component. Greatest improvement came after initiation of steroids-initial thought was bronchoscopy might be needed 9/7 but felt high risk at the time. Hospitalization prolonged by high oxygen requirement and suspected ALI secondary to pneumonia. Subsequent CXR with some improvement, hemodynamics stable but unable to wean off ventimask-with m0vement or removal of Ventimask desats to high 70's. Remains afebrile, WBC trending down. Because of failure to improve plan transfer to [facility].  Assessment/Plan: 1. Acute hypoxic respiratory failure secondary to pneumonia, ALI. Course has waxed and waned but improved no further last 4 days. CCM consulted and have discussed risks benefits of lung biopsy but risk of prolonged mech ventilation which patient clearly doesn't wish.  Rpt CT scan 9/21 unchanged from prior one done 9/3.  Currently on slu-medrol 125 mg IV q6.  Defer further options for management to Pulmonology 2. Healthcare associated pneumonia. Treated with antibiotics. These were discontinued 10/13/13 3. Acute lung injury. Stable. 4. Acute kidney injury. Resolved. Secondary to poor oral intake, Naprosyn. Also lisinopril, hydrochlorothiazide.  5. Dysphagia. History of esophageal stricture requiring dilatation. Followed by gastroenterology in [place] but requests f/u here in [place]. CT showed thickening  of distal esophageal wall. Outpatient followup recommended. 6. PTSD, depression, anxiety.  Stable at this point. Earlier in the admission he was self administering Klonopin. 7. OSA, CPAP-try to continue use q pm 8. Tobacco dependence in remission 9. Chronic pain, sciatica.  Stable.   Discussed with Dr. Beatriz Stallion SDU, thoracic consult to see still   Discussed with Dr. Domenick Gong team will see in consult.    Code Status: full code DVT prophylaxis: Lovenox Family Communication:  Disposition Plan: home  Pleas Koch, MD [facility] [phone]  10/20/2013, 8:06 AM  LOS: 22 days   Consultants:  Pulmonology  Procedures:   Antibiotics:  Zithromax 9/1 >> 911  Vancomycin 9/2 >>911  Merrem 9/4 >>911  Cefepime 9/2 >> 9/4  augmentin 9/11>> 9/15  HPI/Subjective:  Doing poorly Events overnight noted.  Mild sputum.  No CP Didn't;t tolerate Bipap very well No diarr No n/v   Objective: Filed Vitals:   10/20/13 0600 10/20/13 0620 10/20/13 0700 10/20/13 0733  BP:    139/77  Pulse: 69  72 86  Temp:    97.4 F (36.3 C)  TempSrc:    Oral  Resp: Height:      Weight:  108.3 kg (238 lb 12.1 oz)    SpO2: 99%  100% 98%    Intake/Output Summary (Last 24 hours) at 10/20/13 0806 Last data filed at 10/20/13 0300  Gross per 24 hour  Intake   1200 ml  Output   1075 ml  Net    125 ml     Filed Weights   10/18/13 0620 10/19/13 0500 10/20/13 0620  Weight: 110.3 kg (243 lb 2.7 oz) 109.2 kg (240 lb 11.9 oz) 108.3 kg (238 lb 12.1 oz)    Exam:     Remains afebrile, vital signs are stable. On ventimask   Gen. Appears  calm, comfortable. Nontoxic.  Psychiatric. Alert. Speech fluent and clear.   Cardiovascular regular rate and rhythm. No murmur, rub or gallop. No lower extremity edema.  Respiratory. Fair air movement. Fine crackles posterio-basally  Moves all extremities well.    Scheduled Meds: . antiseptic oral rinse  7 mL Mouth Rinse q12n4p    . chlorhexidine  15 mL Mouth Rinse BID  . enoxaparin (LOVENOX) injection  40 mg Subcutaneous Q24H  . escitalopram  20 mg Oral Daily  . feeding supplement (PRO-STAT SUGAR FREE 64)  30 mL Oral TID WC  . guaiFENesin  1,200 mg Oral BID  . ipratropium-albuterol  3 mL Nebulization Q6H WA  . methylPREDNISolone (SOLU-MEDROL) injection  125 mg Intravenous Q6H  . nystatin cream   Topical BID  . pantoprazole (PROTONIX) IV  40 mg Intravenous Q12H  . polyethylene glycol  17 g Oral TID  . senna  1 tablet Oral QHS  . sodium chloride  10-40 mL Intracatheter Q12H  . sucralfate  1 g Oral TID WC & HS  . zolpidem  10 mg Oral QHS   Continuous Infusions:    Principal Problem:   Acute respiratory failure with hypoxia Active Problems:   HCAP (healthcare-associated pneumonia)   Dysphagia, unspecified(787.20)   Acute kidney injury   GERD (gastroesophageal reflux disease)   OSA on CPAP   Acute lung injury   Acute respiratory failure   ARDS (adult respiratory distress syndrome)   20 min

## 2013-10-20 NOTE — Progress Notes (Signed)
Upon pt arrival Craige Cotta, NP notified. Orders placed to not give PO medications due to respiratory distress. VS remain stable. Will continue to monitor.   Debara Pickett, RN

## 2013-10-20 NOTE — Progress Notes (Signed)
Discussed wearing CPAP with pt. & pt. stated that he just wants to wear his oxygen tonight. Attempted to wean pt. To a 55% VM from a NRB & SAT's immediately fell into the low 80's. RN is aware.

## 2013-10-20 NOTE — Progress Notes (Signed)
Pulmonary Brief Follow up: Patient with ILD with dyspnea and hypoxia.  After discussion with the patient and his sister, they are agreeable to a surgical lung biopsy. CT Surgery consulted (Dr. Morton Peters) Cont with steroids and bronchodilators.   Stephanie Acre, MD [facility] Pager [phone] On Call Pager [phone]

## 2013-10-21 ENCOUNTER — Inpatient Hospital Stay (HOSPITAL_COMMUNITY): Payer: Medicaid Other

## 2013-10-21 DIAGNOSIS — I369 Nonrheumatic tricuspid valve disorder, unspecified: Secondary | ICD-10-CM

## 2013-10-21 LAB — POCT I-STAT 3, ART BLOOD GAS (G3+)
Acid-Base Excess: 6 mmol/L — ABNORMAL HIGH (ref 0.0–2.0)
Bicarbonate: 31.7 mEq/L — ABNORMAL HIGH (ref 20.0–24.0)
O2 Saturation: 100 %
Patient temperature: 97
TCO2: 33 mmol/L (ref 0–100)
pCO2 arterial: 46.9 mmHg — ABNORMAL HIGH (ref 35.0–45.0)
pH, Arterial: 7.435 (ref 7.350–7.450)
pO2, Arterial: 176 mmHg — ABNORMAL HIGH (ref 80.0–100.0)

## 2013-10-21 LAB — URINALYSIS, ROUTINE W REFLEX MICROSCOPIC
Bilirubin Urine: NEGATIVE
Glucose, UA: NEGATIVE mg/dL
Hgb urine dipstick: NEGATIVE
Ketones, ur: NEGATIVE mg/dL
Leukocytes, UA: NEGATIVE
Nitrite: NEGATIVE
Protein, ur: NEGATIVE mg/dL
Specific Gravity, Urine: 1.034 — ABNORMAL HIGH (ref 1.005–1.030)
Urobilinogen, UA: 1 mg/dL (ref 0.0–1.0)
pH: 6 (ref 5.0–8.0)

## 2013-10-21 LAB — HEPATIC FUNCTION PANEL
ALT: 42 U/L (ref 0–53)
AST: 15 U/L (ref 0–37)
Albumin: 2.5 g/dL — ABNORMAL LOW (ref 3.5–5.2)
Alkaline Phosphatase: 94 U/L (ref 39–117)
Bilirubin, Direct: <0.2 mg/dL (ref 0.0–0.3)
Total Bilirubin: 0.3 mg/dL (ref 0.3–1.2)
Total Protein: 6.4 g/dL (ref 6.0–8.3)

## 2013-10-21 LAB — PROTIME-INR
INR: 1.19 (ref 0.00–1.49)
Prothrombin Time: 15.1 seconds (ref 11.6–15.2)

## 2013-10-21 LAB — PREPARE RBC (CROSSMATCH)

## 2013-10-21 MED ORDER — DEXTROSE 5 % IV SOLN
1.5000 g | INTRAVENOUS | Status: AC
  Administered 2013-10-22: 1.5 g via INTRAVENOUS
  Filled 2013-10-21: qty 1.5

## 2013-10-21 NOTE — Progress Notes (Signed)
Name: Max [name] MRN: [id number] DOB: 05/09/1950    ADMISSION DATE:  09/28/2013 CONSULTATION DATE:  10/19/2013  REFERRING MD :  [facility] PRIMARY SERVICE:  TRH  CHIEF COMPLAINT:  Dyspnea  BRIEF PATIENT DESCRIPTION: 63 year old male who presented to [facility] 8/31 with dyspnea and in ARF. Dyspnea was thought to be due to HCAP which developed into ARDS during his hospital stay. While these symptoms were improvingmp, his O2 demand has not. He was still having increased O2 requirements with BiPAP. CT chest 9/21 showed extensive bilateral GGO and he was transferred to [facility] for pulmonary evaluation.    SIGNIFICANT EVENTS: 8/31 - admitted to [facility] for presumed HCAP 9/4 - minimal response to ABX, steroids added. 9/6 - Subjective improvement with steroids, PRN BiPAP 9/8 - CXR with severe bilateral infiltrates, persistent. C/w ALI 9/12 - found to be self-medicating with sedating meds.  9/16 - able to reduce FiO2 demand to venti mask, PRN BiPAP at night, off ABX 9/21 - still very slow to improve, transferred to [facility] for additional pulm eval.   STUDIES: 9/3 CT chest > Extensive bilateral ground-glass attenuation airspace disease with superimposed interlobular septal thickening.  9/21 ct chest > Persistent GGO with some fluctuation in distribution. No other significant change. Most c/w ARDS/ atypical PNA.    LINES / TUBES: PICC 9/4 >>>  CULTURES: All from [facility] were negative > Blood 9/1, Expectorated Sputum 9/5  ANTIBIOTICS: Zithromax 9/1 > 911 -- Vancomycin 9/2 > 911-- Merrem 9/4 > 911-- Cefepime 9/2 > 9/4 -- augmentin 9/11 > 9/15   HISTORY OF PRESENT ILLNESS:  63 year old male with PMH of OSA (non-complaint with CPAP), HTN, HTN, and behavioral health issues, presented to [facility] hospital 8/31 c/o dyspnea. He was noted to have bilateral lower lobe infiltrates and a story consistent with PNA.  Based on recent inpatient exposure during left meniscus repair 06/2013 he was given HCAP coverage. He continued to decline,  however, with increased FiO2 demands, eventually escalating to BiPAP. He imaging continued to worsen as infiltrates became more severe and diffuse. They were consistent with ALI presumable from his PNA. He was initiated on IV steroids which seemed to help his symptoms somewhat, but he was still requiring BiPAP with high FiO2 and imaging was slow to improve. 9/21 he was transferred to [facility] for further pulm evaluation.     SUBJECTIVE:  Sitting up in chair, still requiring NRB to maintain saturation. Plan for VATS tomorrow.  VITAL SIGNS: Temp:  [97 F (36.1 C)-98.2 F (36.8 C)] 97.8 F (36.6 C) (09/23 1237) Pulse Rate:  [75-123] 86 (09/23 1237) Resp:  [20-26] 23 (09/23 1237) BP: (95-121)/(66-78) 95/68 mmHg (09/23 1237) SpO2:  [75 %-100 %] 75 % (09/23 1237) FiO2 (%):  [100 %] 100 % (09/23 0400) Weight:  [236 lb 5.3 oz (107.2 kg)] 236 lb 5.3 oz (107.2 kg) (09/23 0400)  PHYSICAL EXAMINATION: General:  Obese male on BiPAP in NAD Neuro:  Alert, oriented, no focal deficit HEENT:  [place]/AT, PERRL, no JVD noted Cardiovascular:  RRR, no MRG noted Lungs:  Diffuse rales, resps even, unlabored on BiPAP Abdomen:  Obese, soft, non-tender. BS normoactive Musculoskeletal:  No acute deformity or ROM limitation Skin:  Intact   Recent Labs Lab 10/16/13 0739 10/19/13 0521 10/20/13 0630  NA 138 141 139  K 3.6* 3.5* 4.2  CL 92* 97 96  CO2 38* 35* 33*  BUN 34* 20 19  CREATININE 0.92 0.88 0.80  GLUCOSE 128* 142* 145*  Recent Labs Lab 10/18/13 0536 10/19/13 0521 10/20/13 0630  HGB 12.1* 10.6* 11.0*  HCT 36.2* 33.1* 34.2*  WBC 23.2* 18.4* 13.1*  PLT 187 169 185   Ct Chest W Contrast  10/19/2013   CLINICAL DATA:  Healthcare associated pneumonia. Shortness of breath.  EXAM: CT CHEST WITH CONTRAST  TECHNIQUE: Multidetector CT imaging of the chest was performed during intravenous contrast administration.  CONTRAST:  80mL OMNIPAQUE IOHEXOL 300 MG/ML  SOLN  COMPARISON:  Radiographs 10/13/2013 and  10/19/2013. Chest CT 10/01/2013.  FINDINGS: Mediastinum: There are no enlarged mediastinal, hilar or axillary lymph nodes. The thyroid gland and trachea appear normal. There is dilatation and wall thickening of the distal esophagus, similar to the prior examination. The heart size is normal. Left arm PICC extends to the SVC right atrial junction. There is stable mild atherosclerosis of the aorta, great vessels and coronary arteries.  Lungs/Pleura: Mild pleural thickening is present bilaterally. There is no pleural or pericardial effusion.Again demonstrated are extensive ground-glass opacities throughout both lungs with associated mild traction bronchiectasis and mosaic pattern. Compared with the prior CT of 3 weeks ago, there is some fluctuation in the pattern with increased disease in the left apex and superior segment of the right lower lobe. The basilar components of the lower lobes appear somewhat improved. There is no cavitation or endobronchial component.  Upper abdomen:  Unremarkable.  There is no adrenal mass.  Musculoskeletal/Chest wall: There are degenerative changes throughout the thoracic spine.  IMPRESSION: 1. Compared with the prior CT from 10/01/2013, there has been some fluctuation in the distribution of the bilateral ground-glass opacities but no other significant change. Findings remain most consistent with atypical pneumonia and/or ARDS. 2. There is no adenopathy or significant pleural or pericardial effusion. 3. Esophageal dilatation and mild wall thickening again noted.   Electronically Signed   By: Roxy Horseman M.D.   On: 10/19/2013 17:05   Dg Chest Port 1 View  10/21/2013   CLINICAL DATA:  Difficulty breathing and cough  EXAM: PORTABLE CHEST - 1 VIEW  COMPARISON:  Chest radiograph and chest CT October 19, 2013  FINDINGS: Widespread reticulonodular disease is again noted bilaterally. There is moderate consolidation throughout the left lung, stable. No new opacity. Heart is enlarged with  pulmonary vascularity grossly within normal limits. Central catheter tip is at the cavoatrial junction. No pneumothorax. No appreciable adenopathy.  IMPRESSION: Widespread interstitial and to a lesser degree alveolar consolidation. 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 There may well be superimposed congestive heart failure and/or pneumonia. The appearance is essentially stable compared to 2 days prior.   Electronically Signed   By: Bretta Bang M.D.   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 He will require VATS to determine etiology, and expect a prolong wean of the ventilator.   - f\u respiratory viral panel - no roles for anti-bacterials at this point - cont high dose steroids 125 mg IV q6 of solumedrol for 3 days. Day 2/3, not much improvement at this time, will cont with current course.   - Plan for VATS tomorrow, will continue to follow along.   H/o OSA on CPAP (Non-compliant) -SpO2 goal >90% -BiPAP prn to achieve SpO2 goal. 4hrs on/ 4 off if  tolerates  Pulmonary Consult time - 35 mins  Stephanie Acre, MD [place] [facility] Pager [phone] On Call Pager [phone]

## 2013-10-21 NOTE — Progress Notes (Signed)
  Echocardiogram 2D Echocardiogram has been performed.  Max [name] 10/21/2013, 10:18 AM

## 2013-10-21 NOTE — Progress Notes (Addendum)
PROGRESS NOTE  Max [name] ZOX:[id number] DOB: December 05, 1950 DOA: 09/28/2013 PCP: Dorrene German, MD  Summary: 63 year old man with history of obstructive sleep apnea, asbestos exposure presented with increasing shortness of breath and was admitted for acute hypoxic respiratory failure and pneumonia 09/27/13 at [facility]. CT chest revealed diffuse bilateral airspace disease with broad differential. Treated for HCAP. Other laboratory workup has been unrevealing including HIV and vasculitis panel. Patient followed by pulmonology, treated with empiric antibiotics and steroids added 10/02/13 for suspected inflammatory component. Greatest improvement came after initiation of steroids-initial thought was bronchoscopy might be needed 9/7 but felt high risk at the time. Hospitalization prolonged by high oxygen requirement and suspected ALI secondary to pneumonia. Subsequent CXR with some improvement, hemodynamics stable but unable to wean off ventimask-with m0vement or removal of Ventimask desats to high 70's. Because of failure to improve plan transfer to [facility].  Assessment/Plan: 1. Acute hypoxic respiratory failure secondary to pneumonia, ALI. For VATs tomorrow 2. Healthcare associated pneumonia. treated 3. Acute kidney injury. Resolved. Secondary to poor oral intake, Naprosyn. Also lisinopril, hydrochlorothiazide.  4. Dysphagia. History of esophageal stricture requiring dilatation. Followed by gastroenterology in [place]. CT showed thickening of distal esophageal wall. Outpatient followup recommended. 5. PTSD, depression, anxiety.  Stable at this point. Earlier in the admission he was self administering Klonopin. 6. OSA, CPAP-try to continue use q pm 7. Tobacco dependence in remission 8. Chronic pain, sciatica.  Preoccupied with discussions of pain medications.   Code Status: full code DVT prophylaxis: Lovenox Family Communication:  Disposition Plan:   10/21/2013, 4:56 PM  LOS: 23 days    Consultants:  Pulmonology  TCTS  Procedures:   Antibiotics:  Zithromax 9/1 >> 911  Vancomycin 9/2 >>911  Merrem 9/4 >>911  Cefepime 9/2 >> 9/4  augmentin 9/11>> 9/15  HPI/Subjective:  Talking about pain medicine incessantly  Objective: Filed Vitals:   10/21/13 0400 10/21/13 0802 10/21/13 1237 10/21/13 1652  BP:  121/78 95/68 123/84  Pulse:  113 86 90  Temp: 97.1 F (36.2 C) 97 F (36.1 C) 97.8 F (36.6 C) 97.5 F (36.4 C)  TempSrc: Oral Oral Oral Oral  Resp: Height:  (1.778 m)     Weight: 107.2 kg (236 lb 5.3 oz)     SpO2: 97% 89% 75% 96%    Intake/Output Summary (Last 24 hours) at 10/21/13 1656 Last data filed at 10/21/13 1500  Gross per 24 hour  Intake   1280 ml  Output   1375 ml  Net    -95 ml     Filed Weights   10/19/13 0500 10/20/13 0620 10/21/13 0400  Weight: 109.2 kg (240 lb 11.9 oz) 108.3 kg (238 lb 12.1 oz) 107.2 kg (236 lb 5.3 oz)    Exam:     Gen. Appears calm, comfortable. Nontoxic. Talkative on NRB  Cardiovascular regular rate and rhythm. No murmur, rub or gallop. No lower extremity edema.  Respiratory. Fair air movement. Fine crackles posterio-basally  Abd: s, nt, nd  Ext no CCE  Scheduled Meds: . antiseptic oral rinse  7 mL Mouth Rinse q12n4p  . [START ON 10/22/2013] cefUROXime (ZINACEF)  IV  1.5 g Intravenous 60 min Pre-Op  . chlorhexidine  15 mL Mouth Rinse BID  . enoxaparin (LOVENOX) injection  40 mg Subcutaneous Q24H  . escitalopram  20 mg Oral Daily  . feeding supplement (ENSURE COMPLETE)  237 mL Oral BID BM  . feeding supplement (PRO-STAT SUGAR FREE 64)  30 mL Oral  TID WC  . guaiFENesin  1,200 mg Oral BID  . ipratropium-albuterol  3 mL Nebulization Q6H WA  . methylPREDNISolone (SOLU-MEDROL) injection  125 mg Intravenous Q6H  . nystatin cream   Topical BID  . pantoprazole  40 mg Oral BID  . polyethylene glycol  17 g Oral TID  . senna  1 tablet Oral QHS  . sodium chloride  10-40 mL Intracatheter  Q12H  . sucralfate  1 g Oral TID WC & HS  . zolpidem  10 mg Oral QHS   Continuous Infusions:    Crista Curb, MD [facility] [phone]

## 2013-10-21 NOTE — Progress Notes (Signed)
  Subjective: Severe interstitial lung disease with high 02 requirements CCM has rec VATS lung bx which is indicatewill plan VATS in am- procedure indications and risks d/w patient  Objective: Vital signs in last 24 hours: Temp:  [97 F (36.1 C)-98.2 F (36.8 C)] 97 F (36.1 C) (09/23 0802) Pulse Rate:  [75-134] 113 (09/23 0802) Cardiac Rhythm:  [-] Normal sinus rhythm (09/23 0400) Resp:  [20-26] 26 (09/23 0802) BP: (96-131)/(66-78) 121/78 mmHg (09/23 0802) SpO2:  [79 %-100 %] 89 % (09/23 0802) FiO2 (%):  [100 %] 100 % (09/23 0400) Weight:  [236 lb 5.3 oz (107.2 kg)] 236 lb 5.3 oz (107.2 kg) (09/23 0400)  Hemodynamic parameters for last 24 hours:   nsr afebrile  Intake/Output from previous day: 09/22 0701 - 09/23 0700 In: 960 [P.O.:960] Out: 1550 [Urine:1550] Intake/Output this shift:   EXAM Alert on 02 Coarse breath sound bilaterally Heart RRR , no murmur extrem warm abd nontender   Lab Results:  Recent Labs  10/19/13 0521 10/20/13 0630  WBC 18.4* 13.1*  HGB 10.6* 11.0*  HCT 33.1* 34.2*  PLT 169 185   BMET:  Recent Labs  10/19/13 0521 10/20/13 0630  NA 141 139  K 3.5* 4.2  CL 97 96  CO2 35* 33*  GLUCOSE 142* 145*  BUN 20 19  CREATININE 0.88 0.80  CALCIUM 8.7 8.8    PT/INR:  Recent Labs  10/21/13 0500  LABPROT 15.1  INR 1.19   ABG    Component Value Date/Time   PHART 7.370 10/04/2013 1553   HCO3 26.8* 10/04/2013 1553   TCO2 24.7 10/04/2013 1553   O2SAT 91.2 10/04/2013 1553   CBG (last 3)  No results found for this basename: GLUCAP,  in the last 72 hours  Assessment/Plan:  L VATS for bx in am 9-24 Patient understands he will be on vent postop > 24 hrs   LOS: 23 days    VAN TRIGT III,PETER 10/21/2013

## 2013-10-22 ENCOUNTER — Inpatient Hospital Stay (HOSPITAL_COMMUNITY): Payer: Medicaid Other

## 2013-10-22 ENCOUNTER — Encounter (HOSPITAL_COMMUNITY)
Admission: EM | Disposition: A | Discharge status: Skilled Nursing Facility | Payer: Self-pay | Source: Home / Self Care | Attending: Internal Medicine

## 2013-10-22 ENCOUNTER — Inpatient Hospital Stay (HOSPITAL_COMMUNITY): Payer: Medicaid Other | Admitting: Anesthesiology

## 2013-10-22 ENCOUNTER — Encounter (HOSPITAL_COMMUNITY): Payer: Self-pay | Admitting: Certified Registered"

## 2013-10-22 ENCOUNTER — Encounter (HOSPITAL_COMMUNITY): Payer: Medicaid Other | Admitting: Anesthesiology

## 2013-10-22 DIAGNOSIS — J8410 Pulmonary fibrosis, unspecified: Secondary | ICD-10-CM

## 2013-10-22 DIAGNOSIS — R1310 Dysphagia, unspecified: Secondary | ICD-10-CM

## 2013-10-22 HISTORY — PX: VIDEO ASSISTED THORACOSCOPY (VATS)/WEDGE RESECTION: SHX6174

## 2013-10-22 LAB — POCT I-STAT 7, (LYTES, BLD GAS, ICA,H+H)
Acid-Base Excess: 6 mmol/L — ABNORMAL HIGH (ref 0.0–2.0)
Bicarbonate: 31.6 mEq/L — ABNORMAL HIGH (ref 20.0–24.0)
Calcium, Ion: 1.18 mmol/L (ref 1.13–1.30)
HCT: 33 % — ABNORMAL LOW (ref 39.0–52.0)
Hemoglobin: 11.2 g/dL — ABNORMAL LOW (ref 13.0–17.0)
O2 Saturation: 96 %
Patient temperature: 97.8
Potassium: 4.3 mEq/L (ref 3.7–5.3)
Sodium: 138 mEq/L (ref 137–147)
TCO2: 33 mmol/L (ref 0–100)
pCO2 arterial: 46.9 mmHg — ABNORMAL HIGH (ref 35.0–45.0)
pH, Arterial: 7.435 (ref 7.350–7.450)
pO2, Arterial: 76 mmHg — ABNORMAL LOW (ref 80.0–100.0)

## 2013-10-22 LAB — POCT I-STAT 3, ART BLOOD GAS (G3+)
Acid-Base Excess: 1 mmol/L (ref 0.0–2.0)
Acid-Base Excess: 6 mmol/L — ABNORMAL HIGH (ref 0.0–2.0)
Acid-Base Excess: 6 mmol/L — ABNORMAL HIGH (ref 0.0–2.0)
Bicarbonate: 34.1 mEq/L — ABNORMAL HIGH (ref 20.0–24.0)
Bicarbonate: 32.4 mEq/L — ABNORMAL HIGH (ref 20.0–24.0)
Bicarbonate: 31.1 mEq/L — ABNORMAL HIGH (ref 20.0–24.0)
O2 Saturation: 98 %
O2 Saturation: 98 %
O2 Saturation: 94 %
Patient temperature: 97.5
Patient temperature: 97.5
Patient temperature: 97.5
TCO2: 37 mmol/L (ref 0–100)
TCO2: 34 mmol/L (ref 0–100)
TCO2: 33 mmol/L (ref 0–100)
pCO2 arterial: 83.8 mmHg (ref 35.0–45.0)
pCO2 arterial: 53.7 mmHg — ABNORMAL HIGH (ref 35.0–45.0)
pCO2 arterial: 46.1 mmHg — ABNORMAL HIGH (ref 35.0–45.0)
pH, Arterial: 7.214 — ABNORMAL LOW (ref 7.350–7.450)
pH, Arterial: 7.386 (ref 7.350–7.450)
pH, Arterial: 7.435 (ref 7.350–7.450)
pO2, Arterial: 132 mmHg — ABNORMAL HIGH (ref 80.0–100.0)
pO2, Arterial: 107 mmHg — ABNORMAL HIGH (ref 80.0–100.0)
pO2, Arterial: 65 mmHg — ABNORMAL LOW (ref 80.0–100.0)

## 2013-10-22 LAB — TRIGLYCERIDES: Triglycerides: 256 mg/dL — ABNORMAL HIGH (ref <150)

## 2013-10-22 LAB — GLUCOSE, CAPILLARY
Glucose-Capillary: 148 mg/dL — ABNORMAL HIGH (ref 70–99)
Glucose-Capillary: 164 mg/dL — ABNORMAL HIGH (ref 70–99)
Glucose-Capillary: 193 mg/dL — ABNORMAL HIGH (ref 70–99)

## 2013-10-22 SURGERY — VIDEO ASSISTED THORACOSCOPY (VATS)/WEDGE RESECTION
Anesthesia: General | Site: Chest | Laterality: Left

## 2013-10-22 MED ORDER — FENTANYL CITRATE 0.05 MG/ML IJ SOLN: 100.0000 ug | INTRAMUSCULAR | Status: DC | PRN

## 2013-10-22 MED ORDER — HYDROMORPHONE HCL 1 MG/ML IJ SOLN: 0.2500 mg | INTRAMUSCULAR | Status: DC | PRN

## 2013-10-22 MED ORDER — HYDROMORPHONE HCL 1 MG/ML IJ SOLN
INTRAMUSCULAR | Status: DC | PRN
  Administered 2013-10-22: 1 mg via INTRAVENOUS

## 2013-10-22 MED ORDER — GLYCOPYRROLATE 0.2 MG/ML IJ SOLN
INTRAMUSCULAR | Status: AC
  Filled 2013-10-22: qty 3

## 2013-10-22 MED ORDER — ONDANSETRON HCL 4 MG/2ML IJ SOLN
INTRAMUSCULAR | Status: AC
  Filled 2013-10-22: qty 2

## 2013-10-22 MED ORDER — LACTATED RINGERS IV SOLN
INTRAVENOUS | Status: DC | PRN
  Administered 2013-10-22: 07:00:00 via INTRAVENOUS

## 2013-10-22 MED ORDER — PROPOFOL 10 MG/ML IV EMUL
INTRAVENOUS | Status: AC
  Administered 2013-10-22: 75 ug/kg/min via INTRAVENOUS
  Filled 2013-10-22: qty 100

## 2013-10-22 MED ORDER — PROPOFOL 10 MG/ML IV BOLUS
INTRAVENOUS | Status: AC
  Filled 2013-10-22: qty 20

## 2013-10-22 MED ORDER — FENTANYL CITRATE 0.05 MG/ML IJ SOLN
INTRAMUSCULAR | Status: AC
  Filled 2013-10-22: qty 5

## 2013-10-22 MED ORDER — PHENYLEPHRINE HCL 10 MG/ML IJ SOLN
INTRAMUSCULAR | Status: AC
  Filled 2013-10-22: qty 1

## 2013-10-22 MED ORDER — FENTANYL CITRATE 0.05 MG/ML IJ SOLN
100.0000 ug | INTRAMUSCULAR | Status: DC | PRN
  Administered 2013-10-23 – 2013-10-24 (×5): 100 ug via INTRAVENOUS
  Filled 2013-10-22 (×5): qty 2

## 2013-10-22 MED ORDER — LIDOCAINE HCL (CARDIAC) 20 MG/ML IV SOLN
INTRAVENOUS | Status: AC
  Filled 2013-10-22: qty 5

## 2013-10-22 MED ORDER — ACETAMINOPHEN 160 MG/5ML PO SOLN
1000.0000 mg | Freq: Four times a day (QID) | ORAL | Status: DC
  Administered 2013-10-22 – 2013-10-24 (×4): 1000 mg via ORAL
  Filled 2013-10-22 (×12): qty 40

## 2013-10-22 MED ORDER — PHENYLEPHRINE HCL 10 MG/ML IJ SOLN
10.0000 mg | INTRAMUSCULAR | Status: DC | PRN
  Administered 2013-10-22: 20 ug/min via INTRAVENOUS

## 2013-10-22 MED ORDER — BISACODYL 5 MG PO TBEC: 10.0000 mg | DELAYED_RELEASE_TABLET | Freq: Every day | ORAL | Status: DC

## 2013-10-22 MED ORDER — METOCLOPRAMIDE HCL 5 MG/ML IJ SOLN
10.0000 mg | Freq: Four times a day (QID) | INTRAMUSCULAR | Status: AC
  Administered 2013-10-22 – 2013-10-23 (×4): 10 mg via INTRAVENOUS
  Filled 2013-10-22 (×4): qty 2

## 2013-10-22 MED ORDER — SODIUM CHLORIDE 0.9 % IV SOLN
INTRAVENOUS | Status: DC
  Filled 2013-10-22: qty 2.5

## 2013-10-22 MED ORDER — ONDANSETRON HCL 4 MG/2ML IJ SOLN
INTRAMUSCULAR | Status: DC | PRN
  Administered 2013-10-22: 4 mg via INTRAVENOUS

## 2013-10-22 MED ORDER — ONDANSETRON HCL 4 MG/2ML IJ SOLN: 4.0000 mg | Freq: Four times a day (QID) | INTRAMUSCULAR | Status: DC | PRN

## 2013-10-22 MED ORDER — MIDAZOLAM HCL 2 MG/2ML IJ SOLN
INTRAMUSCULAR | Status: AC
  Filled 2013-10-22: qty 2

## 2013-10-22 MED ORDER — FAMOTIDINE IN NACL 20-0.9 MG/50ML-% IV SOLN
20.0000 mg | Freq: Two times a day (BID) | INTRAVENOUS | Status: DC
  Administered 2013-10-22 – 2013-10-27 (×11): 20 mg via INTRAVENOUS
  Filled 2013-10-22 (×13): qty 50

## 2013-10-22 MED ORDER — ROCURONIUM BROMIDE 50 MG/5ML IV SOLN
INTRAVENOUS | Status: AC
  Filled 2013-10-22: qty 1

## 2013-10-22 MED ORDER — SODIUM CHLORIDE 0.45 % IV SOLN
INTRAVENOUS | Status: DC
  Administered 2013-10-22 – 2013-10-23 (×4): via INTRAVENOUS

## 2013-10-22 MED ORDER — CETYLPYRIDINIUM CHLORIDE 0.05 % MT LIQD
7.0000 mL | Freq: Four times a day (QID) | OROMUCOSAL | Status: DC
  Administered 2013-10-22 – 2013-10-27 (×20): 7 mL via OROMUCOSAL

## 2013-10-22 MED ORDER — SENNOSIDES-DOCUSATE SODIUM 8.6-50 MG PO TABS: 1.0000 | ORAL_TABLET | Freq: Every day | ORAL | Status: DC

## 2013-10-22 MED ORDER — IPRATROPIUM-ALBUTEROL 0.5-2.5 (3) MG/3ML IN SOLN
3.0000 mL | RESPIRATORY_TRACT | Status: AC
  Administered 2013-10-22 – 2013-10-23 (×7): 3 mL via RESPIRATORY_TRACT
  Filled 2013-10-22 (×8): qty 3

## 2013-10-22 MED ORDER — CHLORHEXIDINE GLUCONATE 0.12 % MT SOLN
15.0000 mL | Freq: Two times a day (BID) | OROMUCOSAL | Status: DC
  Administered 2013-10-22 – 2013-10-26 (×10): 15 mL via OROMUCOSAL
  Filled 2013-10-22 (×9): qty 15

## 2013-10-22 MED ORDER — MIDAZOLAM HCL 2 MG/2ML IJ SOLN: 2.0000 mg | INTRAMUSCULAR | Status: DC | PRN

## 2013-10-22 MED ORDER — 0.9 % SODIUM CHLORIDE (POUR BTL) OPTIME
TOPICAL | Status: DC | PRN
  Administered 2013-10-22: 2000 mL

## 2013-10-22 MED ORDER — FENTANYL CITRATE 0.05 MG/ML IJ SOLN
INTRAMUSCULAR | Status: DC | PRN
  Administered 2013-10-22: 50 ug via INTRAVENOUS
  Administered 2013-10-22: 100 ug via INTRAVENOUS
  Administered 2013-10-22 (×2): 50 ug via INTRAVENOUS
  Administered 2013-10-22: 100 ug via INTRAVENOUS
  Administered 2013-10-22: 50 ug via INTRAVENOUS
  Administered 2013-10-22 (×2): 100 ug via INTRAVENOUS
  Administered 2013-10-22: 150 ug via INTRAVENOUS

## 2013-10-22 MED ORDER — MIDAZOLAM HCL 5 MG/5ML IJ SOLN
INTRAMUSCULAR | Status: DC | PRN
  Administered 2013-10-22: 1 mg via INTRAVENOUS
  Administered 2013-10-22: 2 mg via INTRAVENOUS
  Administered 2013-10-22: 1 mg via INTRAVENOUS

## 2013-10-22 MED ORDER — PROPOFOL 10 MG/ML IV BOLUS
INTRAVENOUS | Status: DC | PRN
  Administered 2013-10-22: 200 mg via INTRAVENOUS

## 2013-10-22 MED ORDER — PHENYLEPHRINE 40 MCG/ML (10ML) SYRINGE FOR IV PUSH (FOR BLOOD PRESSURE SUPPORT)
PREFILLED_SYRINGE | INTRAVENOUS | Status: AC
  Filled 2013-10-22: qty 20

## 2013-10-22 MED ORDER — OXYCODONE HCL 5 MG PO TABS: 5.0000 mg | ORAL_TABLET | Freq: Once | ORAL | Status: DC | PRN

## 2013-10-22 MED ORDER — ACETAMINOPHEN 500 MG PO TABS
1000.0000 mg | ORAL_TABLET | Freq: Four times a day (QID) | ORAL | Status: DC
  Administered 2013-10-23 – 2013-10-24 (×3): 1000 mg via ORAL
  Filled 2013-10-22 (×10): qty 2

## 2013-10-22 MED ORDER — PROPOFOL 10 MG/ML IV EMUL
0.0000 ug/kg/min | INTRAVENOUS | Status: DC
  Administered 2013-10-22: 45 ug/kg/min via INTRAVENOUS
  Administered 2013-10-22 (×4): 75 ug/kg/min via INTRAVENOUS
  Administered 2013-10-23: 40 ug/kg/min via INTRAVENOUS
  Administered 2013-10-23: 35 ug/kg/min via INTRAVENOUS
  Administered 2013-10-23: 50 ug/kg/min via INTRAVENOUS
  Administered 2013-10-23: 30 ug/kg/min via INTRAVENOUS
  Administered 2013-10-23: 40 ug/kg/min via INTRAVENOUS
  Administered 2013-10-23: 50 ug/kg/min via INTRAVENOUS
  Administered 2013-10-23 – 2013-10-24 (×2): 40 ug/kg/min via INTRAVENOUS
  Administered 2013-10-24 (×3): 50 ug/kg/min via INTRAVENOUS
  Administered 2013-10-24: 45 ug/kg/min via INTRAVENOUS
  Administered 2013-10-25: 50 ug/kg/min via INTRAVENOUS
  Filled 2013-10-22 (×23): qty 100

## 2013-10-22 MED ORDER — IPRATROPIUM BROMIDE 0.02 % IN SOLN
0.5000 mg | RESPIRATORY_TRACT | Status: DC | PRN
Start: 2013-10-22 — End: 2013-10-29

## 2013-10-22 MED ORDER — POTASSIUM CHLORIDE 10 MEQ/50ML IV SOLN: 10.0000 meq | Freq: Every day | INTRAVENOUS | Status: DC | PRN

## 2013-10-22 MED ORDER — DEXAMETHASONE SODIUM PHOSPHATE 4 MG/ML IJ SOLN
INTRAMUSCULAR | Status: AC
  Filled 2013-10-22: qty 1

## 2013-10-22 MED ORDER — PROPOFOL INFUSION 10 MG/ML OPTIME
INTRAVENOUS | Status: DC | PRN
Start: 2013-10-22 — End: 2013-10-22
  Administered 2013-10-22: 75 ug/kg/min via INTRAVENOUS

## 2013-10-22 MED ORDER — MIDAZOLAM HCL 2 MG/2ML IJ SOLN
2.0000 mg | INTRAMUSCULAR | Status: DC | PRN
  Administered 2013-10-24 – 2013-10-25 (×2): 2 mg via INTRAVENOUS
  Filled 2013-10-22 (×2): qty 2

## 2013-10-22 MED ORDER — HYDROMORPHONE HCL 1 MG/ML IJ SOLN
INTRAMUSCULAR | Status: AC
  Filled 2013-10-22: qty 1

## 2013-10-22 MED ORDER — MIDAZOLAM HCL 2 MG/2ML IJ SOLN
2.0000 mg | INTRAMUSCULAR | Status: DC | PRN
  Administered 2013-10-23 – 2013-10-25 (×3): 2 mg via INTRAVENOUS
  Filled 2013-10-22 (×3): qty 2

## 2013-10-22 MED ORDER — INSULIN ASPART 100 UNIT/ML ~~LOC~~ SOLN
1.0000 [IU] | SUBCUTANEOUS | Status: DC
  Administered 2013-10-22: 2 [IU] via SUBCUTANEOUS
  Administered 2013-10-22: 1 [IU] via SUBCUTANEOUS
  Administered 2013-10-22: 2 [IU] via SUBCUTANEOUS
  Administered 2013-10-23 (×2): 1 [IU] via SUBCUTANEOUS
  Administered 2013-10-25 (×2): 2 [IU] via SUBCUTANEOUS
  Administered 2013-10-25: 3 [IU] via SUBCUTANEOUS
  Administered 2013-10-25 (×2): 2 [IU] via SUBCUTANEOUS
  Administered 2013-10-25: 3 [IU] via SUBCUTANEOUS
  Administered 2013-10-26 (×3): 2 [IU] via SUBCUTANEOUS
  Administered 2013-10-26: 1 [IU] via SUBCUTANEOUS
  Administered 2013-10-26: 2 [IU] via SUBCUTANEOUS
  Administered 2013-10-26: 3 [IU] via SUBCUTANEOUS
  Administered 2013-10-27: 1 [IU] via SUBCUTANEOUS

## 2013-10-22 MED ORDER — ROCURONIUM BROMIDE 100 MG/10ML IV SOLN
INTRAVENOUS | Status: DC | PRN
  Administered 2013-10-22 (×2): 50 mg via INTRAVENOUS
  Administered 2013-10-22: 10 mg via INTRAVENOUS
  Administered 2013-10-22: 15 mg via INTRAVENOUS
  Administered 2013-10-22: 25 mg via INTRAVENOUS

## 2013-10-22 MED ORDER — OXYCODONE HCL 5 MG/5ML PO SOLN: 5.0000 mg | Freq: Once | ORAL | Status: DC | PRN

## 2013-10-22 MED ORDER — NEOSTIGMINE METHYLSULFATE 10 MG/10ML IV SOLN
INTRAVENOUS | Status: AC
  Filled 2013-10-22: qty 1

## 2013-10-22 SURGICAL SUPPLY — 66 items
APPLICATOR TIP COSEAL (VASCULAR PRODUCTS) IMPLANT
APPLICATOR TIP EXT COSEAL (VASCULAR PRODUCTS) ×3 IMPLANT
CANISTER SUCTION 2500CC (MISCELLANEOUS) ×3 IMPLANT
CATH KIT ON Q 5IN SLV (PAIN MANAGEMENT) IMPLANT
CATH THORACIC 28FR (CATHETERS) ×3 IMPLANT
CATH THORACIC 36FR (CATHETERS) IMPLANT
CATH THORACIC 36FR RT ANG (CATHETERS) IMPLANT
CLIP TI MEDIUM 6 (CLIP) IMPLANT
CONT SPEC 4OZ CLIKSEAL STRL BL (MISCELLANEOUS) ×9 IMPLANT
COVER SURGICAL LIGHT HANDLE (MISCELLANEOUS) ×6 IMPLANT
DERMABOND ADVANCED (GAUZE/BANDAGES/DRESSINGS) ×2
DERMABOND ADVANCED .7 DNX12 (GAUZE/BANDAGES/DRESSINGS) ×1 IMPLANT
DRAPE LAPAROSCOPIC ABDOMINAL (DRAPES) ×3 IMPLANT
DRAPE PROXIMA HALF (DRAPES) ×3 IMPLANT
DRAPE WARM FLUID 44X44 (DRAPE) ×3 IMPLANT
ELECT BLADE 6.5 EXT (BLADE) ×3 IMPLANT
ELECT REM PT RETURN 9FT ADLT (ELECTROSURGICAL) ×3
ELECTRODE REM PT RTRN 9FT ADLT (ELECTROSURGICAL) ×1 IMPLANT
GAUZE SPONGE 4X4 12PLY STRL (GAUZE/BANDAGES/DRESSINGS) ×3 IMPLANT
GLOVE BIO SURGEON STRL SZ 6.5 (GLOVE) ×6 IMPLANT
GLOVE BIO SURGEONS STRL SZ 6.5 (GLOVE) ×3
GLOVE SURG SIGNA 7.5 PF LTX (GLOVE) IMPLANT
GOWN STRL REUS W/ TWL LRG LVL3 (GOWN DISPOSABLE) ×4 IMPLANT
GOWN STRL REUS W/TWL LRG LVL3 (GOWN DISPOSABLE) ×8
HANDLE STAPLE ENDO GIA SHORT (STAPLE) ×2
KIT BASIN OR (CUSTOM PROCEDURE TRAY) ×3 IMPLANT
KIT ROOM TURNOVER OR (KITS) ×3 IMPLANT
KIT SUCTION CATH 14FR (SUCTIONS) ×3 IMPLANT
NS IRRIG 1000ML POUR BTL (IV SOLUTION) ×12 IMPLANT
PACK CHEST (CUSTOM PROCEDURE TRAY) ×3 IMPLANT
PAD ARMBOARD 7.5X6 YLW CONV (MISCELLANEOUS) ×6 IMPLANT
RELOAD EGIA 45 MED/THCK PURPLE (STAPLE) ×9 IMPLANT
RELOAD EGIA 60 MED/THCK PURPLE (STAPLE) ×3 IMPLANT
SEALANT PROGEL (MISCELLANEOUS) IMPLANT
SEALANT SURG COSEAL 4ML (VASCULAR PRODUCTS) IMPLANT
SEALANT SURG COSEAL 8ML (VASCULAR PRODUCTS) ×3 IMPLANT
SOLUTION ANTI FOG 6CC (MISCELLANEOUS) ×3 IMPLANT
STAPLER ENDO GIA 12MM SHORT (STAPLE) ×1 IMPLANT
SUT PROLENE 3 0 SH DA (SUTURE) ×6 IMPLANT
SUT PROLENE 4 0 RB 1 (SUTURE)
SUT PROLENE 4-0 RB1 .5 CRCL 36 (SUTURE) IMPLANT
SUT PROLENE 6 0 C 1 30 (SUTURE) IMPLANT
SUT SILK  1 MH (SUTURE) ×4
SUT SILK 1 MH (SUTURE) ×2 IMPLANT
SUT SILK 2 0SH CR/8 30 (SUTURE) IMPLANT
SUT SILK 3 0SH CR/8 30 (SUTURE) IMPLANT
SUT VIC AB 1 CTX 18 (SUTURE) ×3 IMPLANT
SUT VIC AB 1 CTX 36 (SUTURE)
SUT VIC AB 1 CTX36XBRD ANBCTR (SUTURE) IMPLANT
SUT VIC AB 2-0 CT2 18 VCP726D (SUTURE) IMPLANT
SUT VIC AB 2-0 CTX 36 (SUTURE) ×3 IMPLANT
SUT VIC AB 2-0 UR6 27 (SUTURE) IMPLANT
SUT VIC AB 3-0 SH 18 (SUTURE) IMPLANT
SUT VIC AB 3-0 SH 8-18 (SUTURE) IMPLANT
SUT VIC AB 3-0 X1 27 (SUTURE) ×3 IMPLANT
SUT VICRYL 2 TP 1 (SUTURE) ×6 IMPLANT
SWAB COLLECTION DEVICE MRSA (MISCELLANEOUS) IMPLANT
SYSTEM SAHARA CHEST DRAIN ATS (WOUND CARE) ×3 IMPLANT
TAPE CLOTH SURG 4X10 WHT LF (GAUZE/BANDAGES/DRESSINGS) ×3 IMPLANT
TIP APPLICATOR SPRAY EXTEND 16 (VASCULAR PRODUCTS) IMPLANT
TOWEL OR 17X24 6PK STRL BLUE (TOWEL DISPOSABLE) ×3 IMPLANT
TOWEL OR 17X26 10 PK STRL BLUE (TOWEL DISPOSABLE) ×6 IMPLANT
TRAP SPECIMEN MUCOUS 40CC (MISCELLANEOUS) IMPLANT
TRAY FOLEY CATH 16FRSI W/METER (SET/KITS/TRAYS/PACK) ×3 IMPLANT
TUBE ANAEROBIC SPECIMEN COL (MISCELLANEOUS) IMPLANT
WATER STERILE IRR 1000ML POUR (IV SOLUTION) ×6 IMPLANT

## 2013-10-22 NOTE — Transfer of Care (Signed)
Immediate Anesthesia Transfer of Care Note  Patient: Max [name]  Procedure(s) Performed: Procedure(s): VIDEO ASSISTED THORACOSCOPY (VATS)/WEDGE RESECTION (Left)  Patient Location: ICU  Anesthesia Type:General  Level of Consciousness: Patient remains intubated per anesthesia plan  Airway & Oxygen Therapy: Patient remains intubated per anesthesia plan and Patient placed on Ventilator (see vital sign flow sheet for setting)  Post-op Assessment: Post -op Vital signs reviewed and stable and report to RN on 2H  Post vital signs: Reviewed and stable  Complications: No apparent anesthesia complications

## 2013-10-22 NOTE — Progress Notes (Signed)
The patient was examined and preop studies reviewed. There has been no change from the prior exam and the patient is ready for surgery.   plan Left VATS lung biopsy on J Floor today

## 2013-10-22 NOTE — Progress Notes (Signed)
Pt w/ cuff leak,  Pt loosing volume from vent.  VSS, pushed OET down to 22 (d/t cxray 5.7 cm above carina), RN aware.  Sat 100%, BBSH = coarse clear.

## 2013-10-22 NOTE — Progress Notes (Signed)
OET exchanged per CCM d/t ? Cuff leak on prev. OET.  Pt placed on 100% fio2 prior for preoxygenation. Tube exchanged w/ no noted difficulty,  Tube placed at prev. Spot.  Cuff pressure holding steady at approx 24 cmH2O.  Per MD, wait for approx 1 hour for  f/u ABG

## 2013-10-22 NOTE — Progress Notes (Addendum)
Sputum sample collected , labeled and sent to lab w/ requisition.

## 2013-10-22 NOTE — Progress Notes (Signed)
Panic ABG results given to Dr. Dema Severin.  Per MD order, rate increased to 24, fio2 weaned to 50%

## 2013-10-22 NOTE — Op Note (Signed)
NAME:  Max [name], Max [name] NO.:  [id number]  MEDICAL RECORD NO.:  [id number]  LOCATION:  2H04C                        FACILITY:  [facility]  PHYSICIAN:  Kerin Perna, M.D.  DATE OF BIRTH:  24-Mar-1950  DATE OF PROCEDURE:  10/22/2013 DATE OF DISCHARGE:                              OPERATIVE REPORT   OPERATION:  Left VATS (video-assisted thoracoscopic surgery) with mini thoracotomy and wedge resection-lung biopsy of left upper lobe.  SURGEON:  Kerin Perna, MD  ASSISTANT:  Pauline Good, PA-C  PREOPERATIVE DIAGNOSIS:  Bilateral dense infiltrates with acute respiratory insufficiency of unknown etiology.  POSTOPERATIVE DIAGNOSIS:  Bilateral dense infiltrates with acute respiratory insufficiency of unknown etiology.  ANESTHESIA:  General by Dr. Achille Rich.  DESCRIPTION OF PROCEDURE:  After informed consent had been obtained, and the patient was carefully told that he would probably remain on the ventilator for adequate pulmonary support following the lung biopsy, the patient was brought to the operating room and placed supine on the operating table where general anesthesia was induced.  He was intubated through a single-lumen tube as his lung disease was so severe that single lung ventilation would not be a technique that would have any likelihood of success for the anesthesia management of the procedure. The patient was positioned and turned slightly to the left side.  The left chest was prepped and draped as a sterile field.  A proper time-out was performed.  A small incision was made in the fifth interspace and an anterolateral thoracotomy.  The scope was inserted.  The lungs were blackened and inflamed with erythematous changes as well as contracted with low lung volumes from a restrictive process.  The left upper lobe was isolated and a lung clamp was placed.  Using Endo-GIA stapling devices, two adequate pieces of left upper lobe were resected in  2 separate wedge resections.  The staple lines were hemostatic.  The staple lines were reinforced with a medical adhesive-Coseal.  A small incision was made for a 28-French chest tube which was placed and directed to the apex of the left pleural space.  The lungs re-expanded. The 2 ribs were reapproximated with a single figure-of-eight #2 Vicryl. The fascia was closed with interrupted #1 Vicryl.  The subcutaneous and skin layers were closed with running Vicryl.  Sterile dressings were applied.  The chest tube was placed to underwater seal Pleur-Evac drainage system.  There was no air leak.  There was minimal blood loss.  A chest x-ray was taken in the operating room, which showed proper placement of the chest tube, no pneumothorax, and the endotracheal tube in good position.     Kerin Perna, M.D.     PV/MEDQ  D:  10/22/2013  T:  10/22/2013  Job:  [id number]

## 2013-10-22 NOTE — Progress Notes (Signed)
S/p mini anterior thoracotomy, LUL bx x2. Remains on vent. Appreciate consultants.  Discussed with Dr. Dema Severin. Transfer to PCCM. Call [facility] when extubated and out of ICU.  Crista Curb, MD [facility] [phone]

## 2013-10-22 NOTE — Progress Notes (Signed)
REDUCED tidal vol to 6 ml/kg per ARDS protocol, increased rate to 32.  Pt tol change well so far.

## 2013-10-22 NOTE — Anesthesia Preprocedure Evaluation (Signed)
Anesthesia Evaluation  Patient identified by MRN, date of birth, ID band Patient awake    Reviewed: Allergy & Precautions, H&P , NPO status , Patient's Chart, lab work & pertinent test results  Airway Mallampati: II  Neck ROM: full    Dental   Pulmonary sleep apnea , former smoker,          Cardiovascular hypertension,     Neuro/Psych Anxiety Depression  Neuromuscular disease    GI/Hepatic GERD-  ,  Endo/Other  obese  Renal/GU      Musculoskeletal   Abdominal   Peds  Hematology   Anesthesia Other Findings   Reproductive/Obstetrics                           Anesthesia Physical Anesthesia Plan  ASA: II  Anesthesia Plan: General   Post-op Pain Management:    Induction: Intravenous  Airway Management Planned: Oral ETT and Double Lumen EBT  Additional Equipment: Arterial line, CVP and Ultrasound Guidance Line Placement  Intra-op Plan:   Post-operative Plan: Extubation in OR  Informed Consent: I have reviewed the patients History and Physical, chart, labs and discussed the procedure including the risks, benefits and alternatives for the proposed anesthesia with the patient or authorized representative who has indicated his/her understanding and acceptance.     Plan Discussed with: CRNA, Anesthesiologist and Surgeon  Anesthesia Plan Comments:         Anesthesia Quick Evaluation

## 2013-10-22 NOTE — Progress Notes (Signed)
Called to pt's room by RN for patient on ventilator. Pt appeared to not be receiving his VTe, elevated RR,  had audible leak, and breath could be seen exiting upon exhalation. Cuff was inflated. Advanced tube by 2cm to 24 lips (as CXR showed above carina by 6cm). Pt appears to receiving his volumes, and has improved lung sounds throughout.

## 2013-10-22 NOTE — Progress Notes (Signed)
Pt placed on ARDS protocol by MD.  VT reduced to 55ml/kg per protocol, rate increased to 28, fio2 weaned to 35% per Dr. Dema Severin request.

## 2013-10-22 NOTE — Brief Op Note (Signed)
09/28/2013 - 10/22/2013  10:05 AM  PATIENT:  Max [name]  63 y.o. male  PRE-OPERATIVE DIAGNOSIS:  ARDS  POST-OPERATIVE DIAGNOSIS:  ARDS  PROCEDURE:  Procedure(s):  VIDEO ASSISTED THORACOSCOPY (VATS) -Mini Anterior Thoracotomy -Lung Biopsy Left Upper Lobe x 2  SURGEON:  Surgeon(s) and Role:    * Kerin Perna, MD - Primary  PHYSICIAN ASSISTANT: Lowella Dandy PA-C  ANESTHESIA:   general  EBL:     BLOOD ADMINISTERED:none  DRAINS: 28 Straight chest tube left chest   LOCAL MEDICATIONS USED:  NONE  SPECIMEN:  Source of Specimen:  Left upper lobe biopsy x 2  DISPOSITION OF SPECIMEN:  Microbiology, Pathology  COUNTS:  YES  TOURNIQUET:  * No tourniquets in log *  DICTATION: .Dragon Dictation  PLAN OF CARE: Admit to inpatient   PATIENT DISPOSITION:  ICU - intubated and hemodynamically stable.   Delay start of Pharmacological VTE agent (>24hrs) due to surgical blood loss or risk of bleeding: yes
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
## 2013-10-22 NOTE — Procedures (Signed)
Procedure Note:  Orotracheal Intubation - TUBE EXCHANGE Implied consent due to emergent nature of patient's condition. Correct Patient, Name & ID confirmed.  Patient noted to have tidal volume loss while on the ventilator, cuff pressure check regular, but patient with persistent cuff leak.   The patient was pre-oxygenated and then, under direct visualization, a 8.0 mm cuffed endotracheal tube was placed through the vocal cords into the trachea, over a tube exchanger. Total attempts made 1. Position confirmed by auscultation of lungs (good breath sounds bilaterally) and no stomach sounds. Tube secured at 22 cm. Pulse ox 98 %. CO2 detector in place with appropriate color change.  Pt tolerated procedure well.  No complications were noted.  CXR ordered  Stephanie Acre, MD [facility] Pager [phone] On Call Pager [phone]

## 2013-10-23 ENCOUNTER — Inpatient Hospital Stay (HOSPITAL_COMMUNITY): Payer: Medicaid Other

## 2013-10-23 ENCOUNTER — Encounter (HOSPITAL_COMMUNITY): Payer: Self-pay | Admitting: Cardiothoracic Surgery

## 2013-10-23 LAB — BLOOD GAS, ARTERIAL
Acid-Base Excess: 4.6 mmol/L — ABNORMAL HIGH (ref 0.0–2.0)
Bicarbonate: 28.4 mEq/L — ABNORMAL HIGH (ref 20.0–24.0)
Drawn by: 369891
FIO2: 0.35 %
MECHVT: 440 mL
O2 Saturation: 96.4 %
PEEP: 8 cmH2O
Patient temperature: 98.6
RATE: 32 resp/min
TCO2: 29.7 mmol/L (ref 0–100)
pCO2 arterial: 41.3 mmHg (ref 35.0–45.0)
pH, Arterial: 7.453 — ABNORMAL HIGH (ref 7.350–7.450)
pO2, Arterial: 84.3 mmHg (ref 80.0–100.0)

## 2013-10-23 LAB — GLUCOSE, CAPILLARY
Glucose-Capillary: 145 mg/dL — ABNORMAL HIGH (ref 70–99)
Glucose-Capillary: 124 mg/dL — ABNORMAL HIGH (ref 70–99)
Glucose-Capillary: 70 mg/dL (ref 70–99)
Glucose-Capillary: 82 mg/dL (ref 70–99)
Glucose-Capillary: 78 mg/dL (ref 70–99)
Glucose-Capillary: 81 mg/dL (ref 70–99)

## 2013-10-23 LAB — CBC
HCT: 33.8 % — ABNORMAL LOW (ref 39.0–52.0)
Hemoglobin: 11.1 g/dL — ABNORMAL LOW (ref 13.0–17.0)
MCH: 31.1 pg (ref 26.0–34.0)
MCHC: 32.8 g/dL (ref 30.0–36.0)
MCV: 94.7 fL (ref 78.0–100.0)
Platelets: 170 10*3/uL (ref 150–400)
RBC: 3.57 MIL/uL — ABNORMAL LOW (ref 4.22–5.81)
RDW: 13.4 % (ref 11.5–15.5)
WBC: 15.8 10*3/uL — ABNORMAL HIGH (ref 4.0–10.5)

## 2013-10-23 LAB — BASIC METABOLIC PANEL
Anion gap: 12 (ref 5–15)
BUN: 24 mg/dL — ABNORMAL HIGH (ref 6–23)
CO2: 29 mEq/L (ref 19–32)
Calcium: 8.4 mg/dL (ref 8.4–10.5)
Chloride: 102 mEq/L (ref 96–112)
Creatinine, Ser: 0.82 mg/dL (ref 0.50–1.35)
GFR calc Af Amer: >90 mL/min (ref >90)
GFR calc non Af Amer: >90 mL/min (ref >90)
Glucose, Bld: 99 mg/dL (ref 70–99)
Potassium: 4.2 mEq/L (ref 3.7–5.3)
Sodium: 143 mEq/L (ref 137–147)

## 2013-10-23 MED ORDER — BISACODYL 10 MG RE SUPP
10.0000 mg | Freq: Every day | RECTAL | Status: DC
  Administered 2013-10-24 – 2013-11-07 (×2): 10 mg via RECTAL
  Filled 2013-10-23 (×9): qty 1

## 2013-10-23 MED ORDER — GUAIFENESIN 100 MG/5ML PO SOLN
30.0000 mL | Freq: Four times a day (QID) | ORAL | Status: DC
  Administered 2013-10-23 – 2013-10-29 (×22): 600 mg
  Filled 2013-10-23 (×32): qty 30

## 2013-10-23 MED ORDER — GUAIFENESIN 200 MG PO TABS
1200.0000 mg | ORAL_TABLET | Freq: Two times a day (BID) | ORAL | Status: DC
  Filled 2013-10-23 (×2): qty 6

## 2013-10-23 MED ORDER — SENNOSIDES 8.8 MG/5ML PO SYRP
5.0000 mL | ORAL_SOLUTION | Freq: Every day | ORAL | Status: DC
  Administered 2013-10-23 – 2013-10-25 (×3): 5 mL
  Filled 2013-10-23 (×18): qty 5

## 2013-10-23 MED ORDER — DOCUSATE SODIUM 50 MG/5ML PO LIQD
50.0000 mg | Freq: Every day | ORAL | Status: DC
  Administered 2013-10-23 – 2013-10-25 (×3): 50 mg
  Filled 2013-10-23 (×18): qty 10
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
## 2013-10-23 NOTE — Progress Notes (Signed)
ABG    Component Value Date/Time   PHART 7.453* 10/23/2013 0348   PCO2ART 41.3 10/23/2013 0348   PO2ART 84.3 10/23/2013 0348   HCO3 28.4* 10/23/2013 0348   TCO2 29.7 10/23/2013 0348   O2SAT 96.4 10/23/2013 0348    FI02 35%, RR32, VT 440, +8

## 2013-10-23 NOTE — Progress Notes (Addendum)
      [address]       [place] [address]             [phone]      1 Day Post-Op Procedure(s) (LRB): VIDEO ASSISTED THORACOSCOPY (VATS)/WEDGE RESECTION (Left)  Subjective:  Patient alert, remains intubated  Objective: Vital signs in last 24 hours: Temp:  [97.5 F (36.4 C)-98.1 F (36.7 C)] 98.1 F (36.7 C) (09/25 0310) Pulse Rate:  [50-97] 78 (09/25 0700) Cardiac Rhythm:  [-] Normal sinus rhythm (09/24 2000) Resp:  [0-32] 16 (09/25 0600) BP: (112-143)/(50-71) 120/64 mmHg (09/25 0310) SpO2:  [83 %-100 %] 97 % (09/25 0700) Arterial Line BP: (107-159)/(51-69) 135/69 mmHg (09/25 0700) FiO2 (%):  [35 %-60 %] 35 % (09/25 0310) Weight:  [242 lb 15.2 oz (110.2 kg)-245 lb 13 oz (111.5 kg)] 242 lb 15.2 oz (110.2 kg) (09/25 0500)  Intake/Output from previous day: 09/24 0701 - 09/25 0700 In: 4218.9 [I.V.:3698.9; NG/GT:420; IV Piggyback:100] Out: 2783 [Urine:2400; Emesis/NG output:225; Blood:30; Chest Tube:128] Intake/Output this shift: Total I/O In: -  Out: 250 [Urine:250]  General appearance: alert, cooperative and no distress Heart: regular rate and rhythm Lungs: diminished breath sounds bibasilar Abdomen: soft, non-tender; bowel sounds normal; no masses,  no organomegaly Wound: clean and dry, + air leak with chest tube, minor sub-q air  Lab Results:  Recent Labs  10/22/13 1821 10/23/13 0500  WBC  --  15.8*  HGB 11.2* 11.1*  HCT 33.0* 33.8*  PLT  --  170   BMET:  Recent Labs  10/22/13 1821 10/23/13 0500  NA 138 143  K 4.3 4.2  CL  --  102  CO2  --  29  GLUCOSE  --  99  BUN  --  24*  CREATININE  --  0.82  CALCIUM  --  8.4    PT/INR:  Recent Labs  10/21/13 0500  LABPROT 15.1  INR 1.19   ABG    Component Value Date/Time   PHART 7.453* 10/23/2013 0348   HCO3 28.4* 10/23/2013 0348   TCO2 29.7 10/23/2013 0348   O2SAT 96.4 10/23/2013 0348   CBG (last 3)   Recent Labs  10/22/13 2012 10/22/13 2319 10/23/13 0335  GLUCAP 193*  145* 124*    Assessment/Plan: S/P Procedure(s) (LRB): VIDEO ASSISTED THORACOSCOPY (VATS)/WEDGE RESECTION (Left)  1. Chest tube- + air leak with cough, output low, CXR with bilateral pulmonary edema, atelectasis, opacities 2. Dispo- leave chest tube in place until extubated, will monitor air leak, sub q air, repeat CXR in AM 3. Care per primary   LOS: 25 days    [name], [name] 10/23/2013  patient examined and medical record reviewed,agree with above note. VAN TRIGT III,PETER 10/23/2013

## 2013-10-23 NOTE — Anesthesia Postprocedure Evaluation (Signed)
  Anesthesia Post-op Note  Patient: Max [name]  Procedure(s) Performed: Procedure(s): VIDEO ASSISTED THORACOSCOPY (VATS)/WEDGE RESECTION (Left)  Patient Location: ICU  Anesthesia Type:General  Level of Consciousness: sedated  Airway and Oxygen Therapy: Patient remains intubated per anesthesia plan  Post-op Pain: none  Post-op Assessment: Post-op Vital signs reviewed, Patient's Cardiovascular Status Stable and Respiratory Function Stable  Post-op Vital Signs: Reviewed and stable  Last Vitals:  Filed Vitals:   10/23/13 0821  BP:   Pulse: 74  Temp:   Resp: 34    Complications: No apparent anesthesia complications

## 2013-10-23 NOTE — Progress Notes (Signed)
NUTRITION FOLLOW-UP  DOCUMENTATION CODES Per approved criteria  -Obesity Unspecified   INTERVENTION:  Once extubated and diet resumed, continue:  ProStat 30 ml TID (each 30 ml provides 100 kcal, 15 gr protein)   Ensure Complete po BID, each supplement provides 350 kcal and 13 grams of protein  If unable to extubate recommend:  Vital High Protein @ 20 ml/hr via OG tube 60 ml Prostat QID TF: 1280 kcal, 162 grams protein, and 401 ml H2O TF and Propofol: 1874 total kcal (25 kcal/kg IBW)  NUTRITION DIAGNOSIS: Increased nutrient needs related to acute hypoxic respiratory failure as evidenced by guidelines for estimated nutrition need; ongoing.   Goal: Pt to meet >/= 90% of their estimated nutrition needs, not met.   Monitor: Po intake, labs and wt trends   ASSESSMENT: Admission diagnosis healthcare associated pneumonia and acute hypoxic respiratory failure. He is slowly improving per MD. His hx includes chronic pain, PTSD, constipation and GERD.  Diet advanced to Dysphagia 3 on 9/8.   Pt underwent left VATS for bx 9/24. Pt remains intubated at this time. Per RN hopeful for extubation soon. MD not yet seen pt today. Pt awake and alert on vent.   Patient is currently intubated on ventilator support MV: 14.6 L/min Temp (24hrs), Avg:97.8 F (36.6 C), Min:97.5 F (36.4 C), Max:98.1 F (36.7 C)  Propofol: 22.5 ml/hr provides 594 kcal per day from lipid - pt remains alert and awake on vent.   Height: Ht Readings from Last 1 Encounters:  10/22/13 _0  (1.778 m)    Weight: Wt Readings from Last 1 Encounters:  10/23/13 242 lb 15.2 oz (110.2 kg)    BMI:  Body mass index is 34.86 kg/(m^2). obesity class I  Estimated Nutritional Needs: Kcal: 1716-1950 Vent kcal: 2179 Protein: 117-140 gr Vent protein: >/= 150 grams Fluid: 1.7-2.0 liters daily  Skin: No issues noted   Diet Order: NPO     Intake/Output Summary (Last 24 hours) at 10/23/13 0830 Last data filed at 10/23/13  0800  Gross per 24 hour  Intake 4218.93 ml  Output   3033 ml  Net 1185.93 ml    Last BM: 9/22  Labs:   Recent Labs Lab 10/19/13 0521 10/20/13 0630 10/22/13 1821 10/23/13 0500  NA 141 139 138 143  K 3.5* 4.2 4.3 4.2  CL 97 96  --  102  CO2 35* 33*  --  29  BUN 20 19  --  24*  CREATININE 0.88 0.80  --  0.82  CALCIUM 8.7 8.8  --  8.4  GLUCOSE 142* 145*  --  99    CBG (last 3)   Recent Labs  10/22/13 2319 10/23/13 0335 10/23/13 0758  GLUCAP 145* 124* 70    Scheduled Meds: . acetaminophen  1,000 mg Oral 4 times per day   Or  . acetaminophen (TYLENOL) oral liquid 160 mg/5 mL  1,000 mg Oral 4 times per day  . antiseptic oral rinse  7 mL Mouth Rinse QID  . bisacodyl  10 mg Oral Daily  . chlorhexidine  15 mL Mouth Rinse BID  . enoxaparin (LOVENOX) injection  40 mg Subcutaneous Q24H  . escitalopram  20 mg Oral Daily  . famotidine (PEPCID) IV  20 mg Intravenous Q12H  . feeding supplement (ENSURE COMPLETE)  237 mL Oral BID BM  . feeding supplement (PRO-STAT SUGAR FREE 64)  30 mL Oral TID WC  . guaiFENesin  1,200 mg Oral BID  . insulin aspart  1-3 Units  Subcutaneous 6 times per day  . ipratropium-albuterol  3 mL Nebulization Q4H  . nystatin cream   Topical BID  . pantoprazole  40 mg Oral BID  . polyethylene glycol  17 g Oral TID  . senna  1 tablet Oral QHS  . senna-docusate  1 tablet Oral QHS  . sodium chloride  10-40 mL Intracatheter Q12H  . sucralfate  1 g Oral TID WC & HS  . zolpidem  10 mg Oral QHS    Continuous Infusions: . sodium chloride 100 mL/hr at 10/23/13 0209  . propofol 35 mcg/kg/min (10/23/13 0625)   [place], [place], [place] Pager [phone] After Hours Pager

## 2013-10-23 NOTE — Progress Notes (Signed)
Per MD plans for today:  Attempted SBT this afternoon, pt w/ apnea presently d/t pt sedated.  RN aware.

## 2013-10-24 ENCOUNTER — Inpatient Hospital Stay (HOSPITAL_COMMUNITY): Payer: Medicaid Other

## 2013-10-24 LAB — CBC
HCT: 35 % — ABNORMAL LOW (ref 39.0–52.0)
Hemoglobin: 11.5 g/dL — ABNORMAL LOW (ref 13.0–17.0)
MCH: 31.3 pg (ref 26.0–34.0)
MCHC: 32.9 g/dL (ref 30.0–36.0)
MCV: 95.1 fL (ref 78.0–100.0)
Platelets: 163 10*3/uL (ref 150–400)
RBC: 3.68 MIL/uL — ABNORMAL LOW (ref 4.22–5.81)
RDW: 14 % (ref 11.5–15.5)
WBC: 14 10*3/uL — ABNORMAL HIGH (ref 4.0–10.5)

## 2013-10-24 LAB — GLUCOSE, CAPILLARY
Glucose-Capillary: 103 mg/dL — ABNORMAL HIGH (ref 70–99)
Glucose-Capillary: 70 mg/dL (ref 70–99)
Glucose-Capillary: 75 mg/dL (ref 70–99)
Glucose-Capillary: 77 mg/dL (ref 70–99)
Glucose-Capillary: 85 mg/dL (ref 70–99)
Glucose-Capillary: 94 mg/dL (ref 70–99)

## 2013-10-24 LAB — COMPREHENSIVE METABOLIC PANEL
ALT: 38 U/L (ref 0–53)
AST: 13 U/L (ref 0–37)
Albumin: 2.5 g/dL — ABNORMAL LOW (ref 3.5–5.2)
Alkaline Phosphatase: 71 U/L (ref 39–117)
Anion gap: 10 (ref 5–15)
BUN: 18 mg/dL (ref 6–23)
CO2: 28 mEq/L (ref 19–32)
Calcium: 8.2 mg/dL — ABNORMAL LOW (ref 8.4–10.5)
Chloride: 103 mEq/L (ref 96–112)
Creatinine, Ser: 0.87 mg/dL (ref 0.50–1.35)
GFR calc Af Amer: >90 mL/min (ref >90)
GFR calc non Af Amer: 89 mL/min — ABNORMAL LOW (ref >90)
Glucose, Bld: 82 mg/dL (ref 70–99)
Potassium: 3.8 mEq/L (ref 3.7–5.3)
Sodium: 141 mEq/L (ref 137–147)
Total Bilirubin: 0.5 mg/dL (ref 0.3–1.2)
Total Protein: 5.7 g/dL — ABNORMAL LOW (ref 6.0–8.3)

## 2013-10-24 LAB — TYPE AND SCREEN
ABO/RH(D): O POS
Antibody Screen: NEGATIVE
Unit division: 0
Unit division: 0

## 2013-10-24 LAB — PROCALCITONIN: Procalcitonin: <0.1 ng/mL

## 2013-10-24 LAB — CULTURE, RESPIRATORY W GRAM STAIN

## 2013-10-24 MED ORDER — VITAL HIGH PROTEIN PO LIQD
237.0000 mL | ORAL | Status: DC
  Administered 2013-10-24: 237 mL
  Administered 2013-10-25: 10 mL
  Filled 2013-10-24 (×5): qty 1000

## 2013-10-24 MED ORDER — PRO-STAT SUGAR FREE PO LIQD
60.0000 mL | Freq: Four times a day (QID) | ORAL | Status: DC
  Administered 2013-10-24 – 2013-10-25 (×6): 60 mL
  Filled 2013-10-24 (×10): qty 60

## 2013-10-24 MED ORDER — METHYLPREDNISOLONE SODIUM SUCC 125 MG IJ SOLR
80.0000 mg | Freq: Four times a day (QID) | INTRAMUSCULAR | Status: DC
  Administered 2013-10-24 – 2013-10-29 (×21): 80 mg via INTRAVENOUS
  Filled 2013-10-24 (×3): qty 1.28
  Filled 2013-10-24 (×2): qty 2
  Filled 2013-10-24 (×16): qty 1.28
  Filled 2013-10-24: qty 2
  Filled 2013-10-24 (×2): qty 1.28
  Filled 2013-10-24: qty 2

## 2013-10-24 MED ORDER — FENTANYL CITRATE 0.05 MG/ML IJ SOLN
50.0000 ug | INTRAMUSCULAR | Status: DC | PRN
  Administered 2013-10-24 – 2013-10-25 (×6): 100 ug via INTRAVENOUS
  Filled 2013-10-24 (×6): qty 2

## 2013-10-24 NOTE — Progress Notes (Signed)
Max [name], [place] paged regarding abnormal CXR; pt resting quietly c no current symptoms of pain or distress; VSS; vent settings unchanged; no new orders received; will continue to monitor

## 2013-10-24 NOTE — Progress Notes (Signed)
Pt removed OG tube prior to administering 2200 meds. New OG inserted by Exie Parody. Placement verified by two nurses auscultating and XRay obtained. Spoke with MD in eLink to confirm placement. Tube feeds restarted. Will continue to assess and monitor patient.  Toniann Fail RN
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
## 2013-10-24 NOTE — Progress Notes (Signed)
NUTRITION FOLLOW-UP/CONSULT FOR TF MGT  DOCUMENTATION CODES Per approved criteria  -Obesity Unspecified   INTERVENTION: Initiate Vital High Protein @ 10 ml/hr via OG tube 60 ml Prostat QID TF: 1040 kcal, 141 grams protein, and 201 ml H2O TF and Propofol: 2056 total kcal (27 kcal/kg IBW)  NUTRITION DIAGNOSIS: Increased nutrient needs related to acute hypoxic respiratory failure as evidenced by guidelines for estimated nutrition need; ongoing.   Goal: Enteral nutrition to provide 60-70% of estimated calorie needs (22-25 kcals/kg ideal body weight) and 100% of estimated protein needs, based on ASPEN guidelines for permissive underfeeding in critically ill obese individuals; not met  Monitor: Po intake, labs and wt trends   ASSESSMENT: Admission diagnosis healthcare associated pneumonia and acute hypoxic respiratory failure. He is slowly improving per MD. His hx includes chronic pain, PTSD, constipation and GERD.  Diet advanced to Dysphagia 3 on 9/8.    9/26: Pt remains intubated on vent. RD consulted for enteral/tube feeding initiation and management. High rate of propofol  9/25: Pt underwent left VATS for bx 9/24. Pt remains intubated at this time. Per RN hopeful for extubation soon. MD not yet seen pt today. Pt awake and alert on vent.   Patient is currently intubated on ventilator support MV: 14.2 L/min Temp (24hrs), Avg:98.5 F (36.9 C), Min:97.8 F (36.6 C), Max:99.1 F (37.3 C)  Propofol: 38.5 ml/hr provides 1016 kcal per day from lipid  Height: Ht Readings from Last 1 Encounters:  10/22/13 5' 10"  (1.778 m)    Weight: Wt Readings from Last 1 Encounters:  10/24/13 242 lb 8.1 oz (110 kg)    BMI:  Body mass index is 34.8 kg/(m^2). obesity class I  Estimated Nutritional Needs: Kcal: 2287 (Goal: 9518-8416) Protein: >/= 150 grams Fluid: 1.7-2.0 liters daily  Skin: No issues noted; +1 RLE and LLE edema  Diet Order: NPO     Intake/Output Summary (Last 24 hours)  at 10/24/13 1326 Last data filed at 10/24/13 1300  Gross per 24 hour  Intake 3448.85 ml  Output   4145 ml  Net -696.15 ml    Last BM: 9/23  Labs:   Recent Labs Lab 10/20/13 0630 10/22/13 1821 10/23/13 0500 10/24/13 0531  NA 139 138 143 141  K 4.2 4.3 4.2 3.8  CL 96  --  102 103  CO2 33*  --  29 28  BUN 19  --  24* 18  CREATININE 0.80  --  0.82 0.87  CALCIUM 8.8  --  8.4 8.2*  GLUCOSE 145*  --  99 82    CBG (last 3)   Recent Labs  10/24/13 0358 10/24/13 0747 10/24/13 1145  GLUCAP 85 77 94    Scheduled Meds: . antiseptic oral rinse  7 mL Mouth Rinse QID  . bisacodyl  10 mg Rectal Daily  . chlorhexidine  15 mL Mouth Rinse BID  . docusate  50 mg Per Tube QHS  . enoxaparin (LOVENOX) injection  40 mg Subcutaneous Q24H  . escitalopram  20 mg Oral Daily  . famotidine (PEPCID) IV  20 mg Intravenous Q12H  . feeding supplement (ENSURE COMPLETE)  237 mL Oral BID BM  . feeding supplement (PRO-STAT SUGAR FREE 64)  30 mL Oral TID WC  . guaiFENesin  30 mL Per Tube 4 times per day  . insulin aspart  1-3 Units Subcutaneous 6 times per day  . methylPREDNISolone (SOLU-MEDROL) injection  80 mg Intravenous Q6H  . nystatin cream   Topical BID  . polyethylene  glycol  17 g Oral TID  . sennosides  5 mL Per Tube QHS  . sodium chloride  10-40 mL Intracatheter Q12H  . sucralfate  1 g Oral TID WC & HS  . zolpidem  10 mg Oral QHS    Continuous Infusions: . sodium chloride 100 mL/hr at 10/24/13 0800  . propofol 50 mcg/kg/min (10/24/13 1244)   Pryor Ochoa RD, LDN Inpatient Clinical Dietitian Pager: [phone] After Hours Pager: [phone]

## 2013-10-24 NOTE — Progress Notes (Addendum)
      [address]       [place] [address]             [phone]       2 Days Post-Op Procedure(s) (LRB): VIDEO ASSISTED THORACOSCOPY (VATS)/WEDGE RESECTION (Left)  Subjective: He remains intubated but alert  Objective: Vital signs in last 24 hours: Temp:  [98.1 F (36.7 C)-99.1 F (37.3 C)] 98.1 F (36.7 C) (09/26 0745) Pulse Rate:  [70-96] 76 (09/26 0745) Cardiac Rhythm:  [-] Normal sinus rhythm (09/26 0400) Resp:  [17-35] 32 (09/26 0745) BP: (96-110)/(48-64) 103/48 mmHg (09/26 0745) SpO2:  [92 %-100 %] 99 % (09/26 0745) Arterial Line BP: (84-138)/(43-71) 136/67 mmHg (09/26 0600) FiO2 (%):  [35 %] 35 % (09/26 0745) Weight:  [242 lb 8.1 oz (110 kg)] 242 lb 8.1 oz (110 kg) (09/26 0400)     Intake/Output from previous day: 09/25 0701 - 09/26 0700 In: 3477.9 [I.V.:3007.9; NG/GT:150; IV Piggyback:100] Out: 3970 [Urine:3800; Chest Tube:170]   Physical Exam:  Cardiovascular: RRR. Pulmonary: Diminished at left base; right lung is clear no rales, wheezes, or rhonchi. Abdomen: Soft, non tender, bowel sounds present. Extremities: Trace lower extremity edema. Wounds: Clean and dry.  No erythema or signs of infection. Chest Tube: to water seal, has some tidling but no true air leak  Lab Results: CBC: Recent Labs  10/23/13 0500 10/24/13 0531  WBC 15.8* 14.0*  HGB 11.1* 11.5*  HCT 33.8* 35.0*  PLT 170 163   BMET:  Recent Labs  10/23/13 0500 10/24/13 0531  NA 143 141  K 4.2 3.8  CL 102 103  CO2 29 28  GLUCOSE 99 82  BUN 24* 18  CREATININE 0.82 0.87  CALCIUM 8.4 8.2*    PT/INR: No results found for this basename: LABPROT, INR,  in the last 72 hours ABG:  INR: Will add last result for INR, ABG once components are confirmed Will add last 4 CBG results once components are confirmed  Assessment/Plan:  1. CV - SR in the 90's this am. 2.  Pulmonary - S/p lung biopsies. Await final pathology. Chest tube with 170 cc of sanguinous output.  Some tidling but no true air leak appreciated. CXR this am shows continued bilateral diffuse interstitial prominences, left atelectasis and probable small effusion, no pneumothorax. Chest tube to remain for now. On IV steroids. Vent management per CCM/pulmonary. 3. H and H stable at 11.5 and 35  ZIMMERMAN,DONIELLE MPA-C 10/24/2013,10:31 AM  Chart reviewed, patient examined, agree with above. I would keep the chest tube in until he gets off the vent.

## 2013-10-25 ENCOUNTER — Inpatient Hospital Stay (HOSPITAL_COMMUNITY): Payer: Medicaid Other

## 2013-10-25 LAB — PROCALCITONIN: Procalcitonin: <0.1 ng/mL

## 2013-10-25 LAB — GLUCOSE, CAPILLARY
Glucose-Capillary: 159 mg/dL — ABNORMAL HIGH (ref 70–99)
Glucose-Capillary: 171 mg/dL — ABNORMAL HIGH (ref 70–99)
Glucose-Capillary: 185 mg/dL — ABNORMAL HIGH (ref 70–99)
Glucose-Capillary: 192 mg/dL — ABNORMAL HIGH (ref 70–99)
Glucose-Capillary: 205 mg/dL — ABNORMAL HIGH (ref 70–99)
Glucose-Capillary: 213 mg/dL — ABNORMAL HIGH (ref 70–99)
Glucose-Capillary: 250 mg/dL — ABNORMAL HIGH (ref 70–99)

## 2013-10-25 LAB — SEDIMENTATION RATE: Sed Rate: 70 mm/hr — ABNORMAL HIGH (ref 0–16)

## 2013-10-25 LAB — TRIGLYCERIDES: Triglycerides: 197 mg/dL — ABNORMAL HIGH (ref <150)

## 2013-10-25 LAB — CK TOTAL AND CKMB (NOT AT ARMC)
CK, MB: 1.1 ng/mL (ref 0.3–4.0)
Relative Index: INVALID (ref 0.0–2.5)
Total CK: 87 U/L (ref 7–232)

## 2013-10-25 LAB — RHEUMATOID FACTOR: Rhuematoid fact SerPl-aCnc: <10 IU/mL (ref <=14)

## 2013-10-25 LAB — LACTIC ACID, PLASMA: Lactic Acid, Venous: 1.2 mmol/L (ref 0.5–2.2)

## 2013-10-25 MED ORDER — PROPOFOL 10 MG/ML IV EMUL
5.0000 ug/kg/min | INTRAVENOUS | Status: DC
  Administered 2013-10-25 (×3): 40 ug/kg/min via INTRAVENOUS
  Administered 2013-10-26: 45 ug/kg/min via INTRAVENOUS
  Filled 2013-10-25 (×3): qty 100

## 2013-10-25 MED ORDER — SODIUM CHLORIDE 0.9 % IV SOLN
INTRAVENOUS | Status: DC
  Administered 2013-10-25: 10:00:00 via INTRAVENOUS

## 2013-10-25 MED ORDER — PROPOFOL 10 MG/ML IV EMUL
INTRAVENOUS | Status: AC
  Filled 2013-10-25: qty 100

## 2013-10-25 NOTE — Progress Notes (Signed)
Pt with continued restlessness this evening; resp rate 40's; pt wanting to talk, write, eat, and pull at ET tube; pt requests some "medicine to sleep tonight" O2 sats decreasing to lower 90's with resp therapy at bedside; vent settings changed by RT; PA Babcock aware-orders received to restart propofol gtt for night time; will continue to closely monitor

## 2013-10-25 NOTE — Progress Notes (Addendum)
      [address]       [place] [address]             [phone]       3 Days Post-Op Procedure(s) (LRB): VIDEO ASSISTED THORACOSCOPY (VATS)/WEDGE RESECTION (Left)  Subjective: Patient extubated yesterday morning. On Venti mask this am. He is alert and oriented  Objective: Vital signs in last 24 hours: Temp:  [97.5 F (36.4 C)-99.2 F (37.3 C)] 97.9 F (36.6 C) (09/27 0742) Pulse Rate:  [72-96] 79 (09/27 0900) Cardiac Rhythm:  [-] Normal sinus rhythm (09/27 0800) Resp:  [18-36] 24 (09/27 0900) BP: (98-141)/(36-82) 140/36 mmHg (09/27 0803) SpO2:  [94 %-100 %] 94 % (09/27 0900) Arterial Line BP: (94-153)/(51-71) 153/69 mmHg (09/27 0900) FiO2 (%):  [35 %-40 %] 40 % (09/27 0813) Weight:  [238 lb 12.1 oz (108.3 kg)] 238 lb 12.1 oz (108.3 kg) (09/27 0443)     Intake/Output from previous day: 09/26 0701 - 09/27 0700 In: 4346.4 [I.V.:3276.4; NG/GT:1020; IV Piggyback:50] Out: 4950 [Urine:4910; Chest Tube:40]   Physical Exam:  Cardiovascular: RRR Pulmonary: Crackles Abdomen: Soft, non tender, bowel sounds present. Extremities: Trace lower extremity edema. Wounds: Clean and dry.  No erythema or signs of infection. Chest Tube: to water seal, 2+ air leak  Lab Results: CBC:  Recent Labs  10/23/13 0500 10/24/13 0531  WBC 15.8* 14.0*  HGB 11.1* 11.5*  HCT 33.8* 35.0*  PLT 170 163   BMET:   Recent Labs  10/23/13 0500 10/24/13 0531  NA 143 141  K 4.2 3.8  CL 102 103  CO2 29 28  GLUCOSE 99 82  BUN 24* 18  CREATININE 0.82 0.87  CALCIUM 8.4 8.2*    PT/INR: No results found for this basename: LABPROT, INR,  in the last 72 hours ABG:  INR: Will add last result for INR, ABG once components are confirmed Will add last 4 CBG results once components are confirmed  Assessment/Plan:  1. CV - SR in the 70's this am. 2.  Pulmonary - S/p lung biopsies. Await final pathology. Chest tube with 25 cc of sanguinous output.  2+ air leak appreciated.  CXR shows patient is rotated to the right, no pneumothorax. Chest tube to remain for now. Possibly remove in am. .On IV steroids. 3. H and H stable  at 11.9 and 35.9 4. Management per pulmonary  ZIMMERMAN,DONIELLE MPA-C 10/25/2013,9:31 AM    Large swing in fluid column  In pleurovac- leave CT today patient examined and medical record reviewed,agree with above note. VAN TRIGT III,Jakie Debow 10/27/2013
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
## 2013-10-25 NOTE — Progress Notes (Signed)
Pt placed back on full vent support d/t pt became agitated and RR increased to 40's, Sat dropped to 85%.  Sat initially increased to low 90's%, but then pt desat to 87% on 35% fio2.  Fio2 increased to 40%, sat increased to 92%.  RN aware, at bedside.

## 2013-10-26 LAB — TISSUE CULTURE
Culture: NO GROWTH
Gram Stain: NONE SEEN

## 2013-10-26 LAB — CYCLIC CITRUL PEPTIDE ANTIBODY, IGG: Cyclic Citrullin Peptide Ab: <2 U/mL (ref 0.0–5.0)

## 2013-10-26 LAB — GLUCOSE, CAPILLARY
Glucose-Capillary: 180 mg/dL — ABNORMAL HIGH (ref 70–99)
Glucose-Capillary: 162 mg/dL — ABNORMAL HIGH (ref 70–99)
Glucose-Capillary: 155 mg/dL — ABNORMAL HIGH (ref 70–99)
Glucose-Capillary: 141 mg/dL — ABNORMAL HIGH (ref 70–99)
Glucose-Capillary: 239 mg/dL — ABNORMAL HIGH (ref 70–99)

## 2013-10-26 LAB — ANTI-SCLERODERMA ANTIBODY: Scleroderma (Scl-70) (ENA) Antibody, IgG: <1

## 2013-10-26 LAB — PROCALCITONIN: Procalcitonin: <0.1 ng/mL

## 2013-10-26 MED ORDER — SUCCINYLCHOLINE CHLORIDE 20 MG/ML IJ SOLN
INTRAMUSCULAR | Status: AC
  Filled 2013-10-26: qty 1

## 2013-10-26 MED ORDER — MIDAZOLAM HCL 2 MG/2ML IJ SOLN
INTRAMUSCULAR | Status: AC
  Filled 2013-10-26: qty 2

## 2013-10-26 MED ORDER — MORPHINE SULFATE 2 MG/ML IJ SOLN
1.0000 mg | INTRAMUSCULAR | Status: DC | PRN
  Administered 2013-11-01 – 2013-11-02 (×2): 2 mg via INTRAVENOUS
  Filled 2013-10-26 (×2): qty 1

## 2013-10-26 MED ORDER — FUROSEMIDE 10 MG/ML IJ SOLN
40.0000 mg | Freq: Once | INTRAMUSCULAR | Status: AC
  Administered 2013-10-26: 40 mg via INTRAVENOUS
  Filled 2013-10-26: qty 4

## 2013-10-26 MED ORDER — ETOMIDATE 2 MG/ML IV SOLN
INTRAVENOUS | Status: AC
  Filled 2013-10-26: qty 20

## 2013-10-26 MED ORDER — FENTANYL CITRATE 0.05 MG/ML IJ SOLN
INTRAMUSCULAR | Status: AC
  Filled 2013-10-26: qty 2

## 2013-10-26 MED ORDER — LIDOCAINE HCL (CARDIAC) 20 MG/ML IV SOLN
INTRAVENOUS | Status: AC
  Filled 2013-10-26: qty 5

## 2013-10-26 MED ORDER — ROCURONIUM BROMIDE 50 MG/5ML IV SOLN
INTRAVENOUS | Status: AC
  Filled 2013-10-26: qty 2
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
## 2013-10-26 NOTE — Procedures (Signed)
Extubation Procedure Note  Patient Details:   Name: Max [name] DOB: [birth year] MRN: [id number]   Airway Documentation:     Evaluation  O2 sats: stable throughout Complications: No apparent complications Patient did tolerate procedure well. Bilateral Breath Sounds: Rhonchi Suctioning: Oral Yes pt on NRB 100%  Toula Moos 10/26/2013, 9:59 AM

## 2013-10-27 ENCOUNTER — Inpatient Hospital Stay (HOSPITAL_COMMUNITY): Payer: Medicaid Other

## 2013-10-27 LAB — BASIC METABOLIC PANEL
Anion gap: 12 (ref 5–15)
BUN: 28 mg/dL — ABNORMAL HIGH (ref 6–23)
CO2: 30 mEq/L (ref 19–32)
Calcium: 8.7 mg/dL (ref 8.4–10.5)
Chloride: 99 mEq/L (ref 96–112)
Creatinine, Ser: 0.8 mg/dL (ref 0.50–1.35)
GFR calc Af Amer: >90 mL/min (ref >90)
GFR calc non Af Amer: >90 mL/min (ref >90)
Glucose, Bld: 129 mg/dL — ABNORMAL HIGH (ref 70–99)
Potassium: 4.5 mEq/L (ref 3.7–5.3)
Sodium: 141 mEq/L (ref 137–147)

## 2013-10-27 LAB — CBC
HCT: 35.9 % — ABNORMAL LOW (ref 39.0–52.0)
Hemoglobin: 11.9 g/dL — ABNORMAL LOW (ref 13.0–17.0)
MCH: 30.9 pg (ref 26.0–34.0)
MCHC: 33.1 g/dL (ref 30.0–36.0)
MCV: 93.2 fL (ref 78.0–100.0)
Platelets: 259 10*3/uL (ref 150–400)
RBC: 3.85 MIL/uL — ABNORMAL LOW (ref 4.22–5.81)
RDW: 13.5 % (ref 11.5–15.5)
WBC: 17.2 10*3/uL — ABNORMAL HIGH (ref 4.0–10.5)

## 2013-10-27 LAB — GLUCOSE, CAPILLARY
Glucose-Capillary: 162 mg/dL — ABNORMAL HIGH (ref 70–99)
Glucose-Capillary: 140 mg/dL — ABNORMAL HIGH (ref 70–99)
Glucose-Capillary: 153 mg/dL — ABNORMAL HIGH (ref 70–99)
Glucose-Capillary: 155 mg/dL — ABNORMAL HIGH (ref 70–99)
Glucose-Capillary: 252 mg/dL — ABNORMAL HIGH (ref 70–99)
Glucose-Capillary: 265 mg/dL — ABNORMAL HIGH (ref 70–99)

## 2013-10-27 LAB — SJOGRENS SYNDROME-A EXTRACTABLE NUCLEAR ANTIBODY: SSA (Ro) (ENA) Antibody, IgG: <1

## 2013-10-27 LAB — SJOGRENS SYNDROME-B EXTRACTABLE NUCLEAR ANTIBODY: SSB (La) (ENA) Antibody, IgG: <1

## 2013-10-27 MED ORDER — FAMOTIDINE 20 MG PO TABS
20.0000 mg | ORAL_TABLET | Freq: Two times a day (BID) | ORAL | Status: DC
Start: 2013-10-27 — End: 2013-10-27
  Filled 2013-10-27: qty 1

## 2013-10-27 MED ORDER — PANTOPRAZOLE SODIUM 40 MG IV SOLR
40.0000 mg | Freq: Every day | INTRAVENOUS | Status: DC
  Administered 2013-10-27: 40 mg via INTRAVENOUS
  Filled 2013-10-27: qty 40

## 2013-10-27 MED ORDER — INSULIN ASPART 100 UNIT/ML ~~LOC~~ SOLN
1.0000 [IU] | Freq: Three times a day (TID) | SUBCUTANEOUS | Status: DC
  Administered 2013-10-27: 3 [IU] via SUBCUTANEOUS
  Administered 2013-10-28 (×2): 2 [IU] via SUBCUTANEOUS
  Administered 2013-10-28: 3 [IU] via SUBCUTANEOUS
  Administered 2013-10-29: 1 [IU] via SUBCUTANEOUS

## 2013-10-27 MED ORDER — ENSURE COMPLETE PO LIQD
237.0000 mL | Freq: Three times a day (TID) | ORAL | Status: DC
  Administered 2013-10-27 – 2013-10-29 (×6): 237 mL via ORAL
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
## 2013-10-27 NOTE — Progress Notes (Signed)
NUTRITION FOLLOW-UP  DOCUMENTATION CODES Per approved criteria  -Obesity Unspecified   INTERVENTION: Continue Ensure Complete po TID, each supplement provides 350 kcal and 13 grams of protein - pt prefers chocolate  NUTRITION DIAGNOSIS: Increased nutrient needs related to acute hypoxic respiratory failure as evidenced by guidelines for estimated nutrition need; ongoing.   Goal: Enteral nutrition to provide 60-70% of estimated calorie needs (22-25 kcals/kg ideal body weight) and 100% of estimated protein needs, based on ASPEN guidelines for permissive underfeeding in critically ill obese individuals; NA  Pt to meet >/= 90% of their estimated nutrition needs   Monitor: Po intake, labs and wt trends   ASSESSMENT: Admission diagnosis healthcare associated pneumonia and acute hypoxic respiratory failure. His hx includes chronic pain, PTSD, constipation and GERD.   Pt underwent left VATS for bx 9/24 and remained intubated until 9/28. Pt now extubated and on PO diet eating 100% of his meals. Pt likes his ensure supplements.    Height: Ht Readings from Last 1 Encounters:  10/22/13 5\' 10"  (1.778 m)    Weight: Wt Readings from Last 1 Encounters:  10/27/13 237 lb (107.502 kg)  Pt negative 10  Liters  BMI:  Body mass index is 34.01 kg/(m^2). obesity class I  Estimated Nutritional Needs: Kcal: 1716-1950  Protein: 117-140 gr  Fluid: 1.7-2.0 liters daily  Skin:  Left chest incision +1 edema RLE and LLE  Diet Order: Cardiac  Meal Completion: 100%    Intake/Output Summary (Last 24 hours) at 10/27/13 1530 Last data filed at 10/27/13 1448  Gross per 24 hour  Intake   1950 ml  Output   2225 ml  Net   -275 ml    Last BM: 9/23  Labs:   Recent Labs Lab 10/23/13 0500 10/24/13 0531 10/27/13 0341  NA 143 141 141  K 4.2 3.8 4.5  CL 102 103 99  CO2 29 28 30   BUN 24* 18 28*  CREATININE 0.82 0.87 0.80  CALCIUM 8.4 8.2* 8.7  GLUCOSE 99 82 129*    CBG (last 3)   Recent  Labs  10/27/13 0343 10/27/13 0804 10/27/13 1234  GLUCAP 140* 153* 155*    Scheduled Meds: . bisacodyl  10 mg Rectal Daily  . docusate  50 mg Per Tube QHS  . enoxaparin (LOVENOX) injection  40 mg Subcutaneous Q24H  . escitalopram  20 mg Oral Daily  . feeding supplement (ENSURE COMPLETE)  237 mL Oral TID WC  . guaiFENesin  30 mL Per Tube 4 times per day  . insulin aspart  1-3 Units Subcutaneous TID AC  . methylPREDNISolone (SOLU-MEDROL) injection  80 mg Intravenous Q6H  . nystatin cream   Topical BID  . pantoprazole (PROTONIX) IV  40 mg Intravenous Daily  . polyethylene glycol  17 g Oral TID  . sennosides  5 mL Per Tube QHS  . sodium chloride  10-40 mL Intracatheter Q12H  . sucralfate  1 g Oral TID WC & HS  . zolpidem  10 mg Oral QHS    Continuous Infusions: . sodium chloride 10 mL/hr at 10/26/13 2000   Kendell BaneHeather Ainsley Deakins RD, LDN, CNSC [phone] Pager [phone] After Hours Pager

## 2013-10-28 ENCOUNTER — Inpatient Hospital Stay (HOSPITAL_COMMUNITY): Payer: Medicaid Other

## 2013-10-28 LAB — GLUCOSE, CAPILLARY
Glucose-Capillary: 227 mg/dL — ABNORMAL HIGH (ref 70–99)
Glucose-Capillary: 180 mg/dL — ABNORMAL HIGH (ref 70–99)
Glucose-Capillary: 173 mg/dL — ABNORMAL HIGH (ref 70–99)

## 2013-10-28 LAB — ALDOLASE: Aldolase: 6.6 U/L (ref <=8.1)

## 2013-10-28 MED ORDER — CLONAZEPAM 0.5 MG PO TABS
1.0000 mg | ORAL_TABLET | Freq: Every day | ORAL | Status: DC
  Administered 2013-10-28 – 2013-10-29 (×2): 1 mg via ORAL
  Filled 2013-10-28: qty 1
  Filled 2013-10-28: qty 2

## 2013-10-28 MED ORDER — GABAPENTIN 400 MG PO CAPS
400.0000 mg | ORAL_CAPSULE | Freq: Three times a day (TID) | ORAL | Status: DC
  Administered 2013-10-28 – 2013-11-08 (×35): 400 mg via ORAL
  Filled 2013-10-28 (×37): qty 1

## 2013-10-28 MED ORDER — OXYCODONE-ACETAMINOPHEN 5-325 MG PO TABS
1.0000 | ORAL_TABLET | Freq: Four times a day (QID) | ORAL | Status: DC | PRN
  Administered 2013-10-28 – 2013-11-05 (×26): 1 via ORAL
  Filled 2013-10-28 (×26): qty 1

## 2013-10-28 NOTE — Progress Notes (Signed)
Inpatient Diabetes Program Recommendations  AACE/ADA: New Consensus Statement on Inpatient Glycemic Control (2013)  Target Ranges:  Prepandial:   less than 140 mg/dL      Peak postprandial:   less than 180 mg/dL (1-2 hours)      Critically ill patients:  140 - 180 mg/dL   Results for Max [name], Advait D (MRN [id number]) as of 10/28/2013 10:56  Ref. Range 10/27/2013 08:04 10/27/2013 12:34 10/27/2013 16:47 10/27/2013 20:34 10/28/2013 09:02  Glucose-Capillary Latest Range: 70-99 mg/dL 347153 (H) 425155 (H) 956252 (H) 265 (H) 227 (H)   Diabetes history: No Outpatient Diabetes medications: NA Current orders for Inpatient glycemic control: Novolog 1-3 units AC (ICU Glycemic Control order set, phase 1)  Inpatient Diabetes Program Recommendations Correction (SSI): Noted pt is ordered Solumedrol 80 mg Q6H and CBGs remain consistently elevated. Please consider discontinuing ICU Novolog correction scale and order regular Glycemic Control order set using Moderate correction scale ACHS.  Thanks, Orlando PennerMarie Neda Willenbring, RN, MSN, CCRN Diabetes Coordinator Inpatient Diabetes Program [phone] (Team Pager) [phone] ([facility] office) [phone] [facility] office)

## 2013-10-28 NOTE — Progress Notes (Addendum)
      [address]       [place] [id number]             [phone]       6 Days Post-Op Procedure(s) (LRB): VIDEO ASSISTED THORACOSCOPY (VATS)/WEDGE RESECTION (Left)  Subjective: Patient . On Venti mask this am. He is alert and oriented  Objective: Vital signs in last 24 hours: Temp:  [97.5 F (36.4 C)-98.8 F (37.1 C)] 98.8 F (37.1 C) (09/30 0348) Pulse Rate:  [75-99] 88 (09/30 0600) Cardiac Rhythm:  [-] Normal sinus rhythm (09/29 2000) Resp:  [16-26] 16 (09/30 0600) BP: (112-138)/(69-96) 138/70 mmHg (09/30 0400) SpO2:  [93 %-100 %] 97 % (09/30 0600) FiO2 (%):  [30 %-100 %] 30 % (09/30 0600) Weight:  [231 lb 8 oz (105.008 kg)] 231 lb 8 oz (105.008 kg) (09/30 0500)     Intake/Output from previous day: 09/29 0701 - 09/30 0700 In: 1770 [P.O.:1680; I.V.:40; IV Piggyback:50] Out: 1595 [Urine:1575; Chest Tube:20]   Physical Exam:  Cardiovascular: RRR Pulmonary: Some rales Abdomen: Soft, non tender, bowel sounds present. Wounds: Clean and dry.  No erythema or signs of infection. Chest Tube: to water seal, some tidling with cough but no true air leak  Lab Results: CBC:  Recent Labs  10/27/13 0341  WBC 17.2*  HGB 11.9*  HCT 35.9*  PLT 259   BMET:   Recent Labs  10/27/13 0341  NA 141  K 4.5  CL 99  CO2 30  GLUCOSE 129*  BUN 28*  CREATININE 0.80  CALCIUM 8.7    PT/INR: No results found for this basename: LABPROT, INR,  in the last 72 hours ABG:  INR: Will add last result for INR, ABG once components are confirmed Will add last 4 CBG results once components are confirmed  Assessment/Plan:  1. CV - SR in the 70's this am. 2.  Pulmonary - S/p lung biopsies. Await final pathology. Chest tube with 20 cc of sanguinous output. There is some tidling with cough but no true air leak. CXR shows patient is rotated to the left, trace left apical pneumothorax. Hope to remove. He was NRB dependent prior to admission.On IV steroids. 3. H and H  stable  at 11.9 and 35.9 4. Management per pulmonary  ZIMMERMAN,DONIELLE MPA-C 10/28/2013,8:36 AM   CXR personally reviewed DC chest tube  patient examined and medical record reviewed,agree with above note. VAN TRIGT III,Ellery Meroney 10/28/2013
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
## 2013-10-28 NOTE — Progress Notes (Addendum)
Left chest tube removed without difficulty. Nylon sutured secured. Vasoline gauze and 4x4 with tape applied. Patient tolerated procedure well. STAT portable CXR ordered.  Leave chest tube sutures in place for 10-14 days because of high-dose steroid therapy.

## 2013-10-28 NOTE — Progress Notes (Signed)
Report called to Rn [id number]. Will transfer via wc.

## 2013-10-29 ENCOUNTER — Inpatient Hospital Stay (HOSPITAL_COMMUNITY): Payer: Medicaid Other

## 2013-10-29 DIAGNOSIS — J9601 Acute respiratory failure with hypoxia: Secondary | ICD-10-CM

## 2013-10-29 DIAGNOSIS — J8409 Other alveolar and parieto-alveolar conditions: Secondary | ICD-10-CM

## 2013-10-29 LAB — CBC
HCT: 35.7 % — ABNORMAL LOW (ref 39.0–52.0)
Hemoglobin: 12.3 g/dL — ABNORMAL LOW (ref 13.0–17.0)
MCH: 31.6 pg (ref 26.0–34.0)
MCHC: 34.5 g/dL (ref 30.0–36.0)
MCV: 91.8 fL (ref 78.0–100.0)
Platelets: 284 10*3/uL (ref 150–400)
RBC: 3.89 MIL/uL — ABNORMAL LOW (ref 4.22–5.81)
RDW: 13.3 % (ref 11.5–15.5)
WBC: 15.6 10*3/uL — ABNORMAL HIGH (ref 4.0–10.5)

## 2013-10-29 LAB — BASIC METABOLIC PANEL
Anion gap: 12 (ref 5–15)
BUN: 23 mg/dL (ref 6–23)
CO2: 27 mEq/L (ref 19–32)
Calcium: 8.5 mg/dL (ref 8.4–10.5)
Chloride: 97 mEq/L (ref 96–112)
Creatinine, Ser: 0.75 mg/dL (ref 0.50–1.35)
GFR calc Af Amer: >90 mL/min (ref >90)
GFR calc non Af Amer: >90 mL/min (ref >90)
Glucose, Bld: 260 mg/dL — ABNORMAL HIGH (ref 70–99)
Potassium: 3.8 mEq/L (ref 3.7–5.3)
Sodium: 136 mEq/L — ABNORMAL LOW (ref 137–147)

## 2013-10-29 LAB — GLUCOSE, CAPILLARY
Glucose-Capillary: 143 mg/dL — ABNORMAL HIGH (ref 70–99)
Glucose-Capillary: 151 mg/dL — ABNORMAL HIGH (ref 70–99)

## 2013-10-29 MED ORDER — ALBUTEROL SULFATE (2.5 MG/3ML) 0.083% IN NEBU
2.5000 mg | INHALATION_SOLUTION | RESPIRATORY_TRACT | Status: DC | PRN
Start: 2013-10-29 — End: 2013-11-08

## 2013-10-29 MED ORDER — METHYLPREDNISOLONE SODIUM SUCC 125 MG IJ SOLR
60.0000 mg | Freq: Two times a day (BID) | INTRAMUSCULAR | Status: DC
  Administered 2013-10-29 – 2013-11-03 (×10): 60 mg via INTRAVENOUS
  Filled 2013-10-29 (×4): qty 0.96
  Filled 2013-10-29: qty 2
  Filled 2013-10-29 (×2): qty 0.96
  Filled 2013-10-29: qty 2
  Filled 2013-10-29 (×3): qty 0.96
  Filled 2013-10-29: qty 2
  Filled 2013-10-29 (×2): qty 0.96

## 2013-10-29 MED ORDER — CLONAZEPAM 1 MG PO TABS
1.0000 mg | ORAL_TABLET | Freq: Two times a day (BID) | ORAL | Status: DC
  Administered 2013-10-29 – 2013-11-08 (×20): 1 mg via ORAL
  Filled 2013-10-29 (×20): qty 1

## 2013-10-29 NOTE — Progress Notes (Signed)
Inpatient Diabetes Program Recommendations  AACE/ADA: New Consensus Statement on Inpatient Glycemic Control (2013)  Target Ranges:  Prepandial:   less than 140 mg/dL      Peak postprandial:   less than 180 mg/dL (1-2 hours)      Critically ill patients:  140 - 180 mg/dL  Results for Max [name], [name] (MRN [id number]) as of 10/29/2013 10:05  Ref. Range 10/27/2013 08:04 10/27/2013 12:34 10/27/2013 16:47 10/27/2013 20:34 10/28/2013 09:02 10/28/2013 11:49 10/28/2013 17:03 10/29/2013 08:33  Glucose-Capillary Latest Range: 70-99 mg/dL 782153 (H) 956155 (H) 213252 (H) 265 (H) 227 (H) 180 (H) 173 (H) 143 (H)   Diabetes history: No  Outpatient Diabetes medications: NA  Current orders for Inpatient glycemic control: Novolog 1-3 units AC (ICU Glycemic Control order set, phase 1)  Inpatient Diabetes Program Recommendations Correction (SSI): Noted pt is ordered Solumedrol 80 mg Q6H which is contributing to elevated glucose. Please consider discontinuing ICU Novolog correction scale and order Novolog correction ACHS using regular Glycemic Control order set.  Thanks, Orlando PennerMarie Candus Braud, RN, MSN, CCRN Diabetes Coordinator Inpatient Diabetes Program [phone] (Team Pager) [phone] ([facility] office) [phone] [facility] office)

## 2013-10-29 NOTE — Progress Notes (Addendum)
      [address]       [place] [id number]             [phone]       7 Days Post-Op Procedure(s) (LRB): VIDEO ASSISTED THORACOSCOPY (VATS)/WEDGE RESECTION (Left)  Subjective: Patient . On Venti mask this am. He is alert and oriented. He is eating breakfast.  Objective: Vital signs in last 24 hours: Temp:  [97.4 F (36.3 C)-97.9 F (36.6 C)] 97.7 F (36.5 C) (10/01 0800) Pulse Rate:  [62-96] 92 (10/01 0800) Cardiac Rhythm:  [-] Normal sinus rhythm (09/30 2005) Resp:  [15-28] 26 (10/01 0800) BP: (104-142)/(64-89) 104/76 mmHg (10/01 0800) SpO2:  [80 %-97 %] 95 % (10/01 0800) FiO2 (%):  [30 %] 30 % (09/30 1151)     Intake/Output from previous day: 09/30 0701 - 10/01 0700 In: 1080 [P.O.:1080] Out: 2351 [Urine:2350; Stool:1]   Physical Exam:  Cardiovascular: RRR Pulmonary: Fairly clear this am Abdomen: Soft, non tender, bowel sounds present. Wounds: Clean and dry.  No erythema or signs of infection.   Lab Results: CBC:  Recent Labs  10/27/13 0341 10/29/13 0500  WBC 17.2* 15.6*  HGB 11.9* 12.3*  HCT 35.9* 35.7*  PLT 259 284   BMET:   Recent Labs  10/27/13 0341 10/29/13 0500  NA 141 136*  K 4.5 3.8  CL 99 97  CO2 30 27  GLUCOSE 129* 260*  BUN 28* 23  CREATININE 0.80 0.75  CALCIUM 8.7 8.5    PT/INR: No results found for this basename: LABPROT, INR,  in the last 72 hours ABG:  INR: Will add last result for INR, ABG once components are confirmed Will add last 4 CBG results once components are confirmed  Assessment/Plan:  1. CV - SR in the 70's this am. 2.  Pulmonary - S/p lung biopsies. Chest tube removed yesterday. CXR this am is stable (showstrace trace, left apical pneumothorax, bilateral airspace disease L>R).On IV steroids. Final pathology revealed:1. 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 Dr. Donata ClayVan Trigt to discuss pathology with patient. 3. H and H stable  at 12.3 and 35.7 4. Management per pulmonary 5. I have arranged a follow up appointment with Dr. Donata ClayVan Trigt   ZIMMERMAN,DONIELLE MPA-C 10/29/2013,9:03 AM   patient examined and medical record reviewed,agree with above note. VAN TRIGT III,PETER 10/29/2013

## 2013-10-29 NOTE — Progress Notes (Signed)
   SIGNIFICANT EVENTS:  8/31 - admitted [facility] for presumed HCAP  9/3 CT chest > Extensive bilateral ground-glass attenuation airspace disease with superimposed interlobular septal thickening.  9/4 - minimal response to ABX, steroids added.  9/6 - Subjective improvement with steroids, PRN BiPAP  9/8 - CXR with severe bilateral infiltrates, persistent. C/w ALI  9/12 - found to be self-medicating with sedating meds.  9/16 - able to reduce FiO2 demand to venti mask, PRN BiPAP at night, off ABX  9/21 - still very slow to improve, transferred to [facility] for additional pulm eval. Started on high dose steroids from 9/21-9/24  9/21 ct chest > Persistent GGO with some fluctuation in distribution. No other significant change. Most c/w ARDS/ atypical PNA.  9/24 - CTS (Dr. Donata ClayVan Trigt) - Mini Anterior Thoracotomy -Lung Biopsy Left Upper Lobe x 2  10/24/13: Intermittently very agitated. Peep 8, fio2 35%, RESTART SOLUMEDROL 10/01 tolerating VM without distress. Transfer to med-surg ordered. Methylpred decreased  No new complaints. No distress. SpO2 98% on VM  Filed Vitals:   10/29/13 0500 10/29/13 0600 10/29/13 0800 10/29/13 1133  BP: 116/77 117/64 104/76 124/79  Pulse: 95 90 92 114  Temp:   97.7 F (36.5 C) 97.8 F (36.6 C)  TempSrc:   Oral Oral  Resp: 28 23 26 22   Height:      Weight:      SpO2: 85% 94% 95% 92%   Pleasant, NAD No JVD Bilateral crackles RRR NABS No edema  I have reviewed all of today's lab results. Relevant abnormalities are discussed in the A/P section  CXR: No change in a small left apical pneumothorax. 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  After 5:30 PM or weekends, call [phone]

## 2013-10-29 NOTE — Discharge Instructions (Signed)
Thoracotomy, Care After °Refer to this sheet in the next few weeks. These instructions provide you with information on caring for yourself after your procedure. Your health care provider may also give you more specific instructions. Your treatment has been planned according to current medical practices, but problems sometimes occur. Call your health care provider if you have any problems or questions after your procedure. °WHAT TO EXPECT AFTER YOUR PROCEDURE °After your procedure, it is typical to have the following sensations: °· You may feel pain at the incision site. °· You may be constipated from the pain medicine given and the change in your level of activity. °· You may feel extremely tired. °HOME CARE INSTRUCTIONS °· Take over-the-counter or prescription medicines for pain, discomfort, or fever only as directed by your health care provider. It is very important to take pain relieving medicine before your pain becomes severe. You will be able to breathe and cough more comfortably if your pain is well controlled. °· Take deep breaths. Deep breathing helps to keep your lungs inflated and protects against a lung infection (pneumonia). °· Cough frequently. Even though coughing may cause discomfort, coughing is important to clear mucus (phlegm) and expand your lungs. Coughing helps prevent pneumonia. If it hurts to cough, hold a pillow against your chest when you cough. This may help with the discomfort. °· Continue to use an incentive spirometer as directed. The use of an incentive spirometer helps to keep your lungs inflated and protects against pneumonia. °· Change the bandages over your incision as needed or as directed by your health care provider. °· Remove the bandages over your chest tube site as directed by your health care provider. °· Resume your normal diet as directed. It is important to have adequate protein, calories, vitamins, and minerals to promote healing. °· Prevent constipation. °¨ Eat  high-fiber foods such as whole grain cereals and breads, brown rice, beans, and fresh fruits and vegetables. °¨ Drink enough water and fluids to keep your urine clear or pale yellow. Avoid drinking beverages containing caffeine. Beverages containing caffeine can cause dehydration and harden your stool. °¨ Talk to your health care provider about taking a stool softener or laxative. °· Avoid lifting until you are instructed otherwise. °· Do not drive until directed by your health care provider.  Do not drive while taking pain medicines (narcotics). °· Do not bathe, swim, or use a hot tub until directed by your health care provider. You may shower instead. Gently wash the area of your incision with water and soap as directed. Do not use anything else to clean your incision except as directed by your health care provider. °· Do not use any tobacco products including cigarettes, chewing tobacco, or electronic cigarettes. °· Avoid secondhand smoke. °· Schedule an appointment for stitch (suture) or staple removal as directed. °· Schedule and attend all follow-up visits as directed by your health care provider. It is important to keep all your appointments. °· Participate in pulmonary rehabilitation as directed by your health care provider. °· Do not travel by airplane for 2 weeks after your chest tube is removed. °SEEK MEDICAL CARE IF: °· You are bleeding from your wounds. °· Your heartbeat seems irregular. °· You have redness, swelling, or increasing pain in the wounds. °· There is pus coming from your wounds. °· There is a bad smell coming from the wound or dressing. °· You have a fever or chills. °· You have nausea or are vomiting. °· You have muscle aches. °SEEK   IMMEDIATE MEDICAL CARE IF: °· You have a rash. °· You have difficulty breathing. °· You have a reaction or side effect to medicines given. °· You have persistent nausea. °· You have lightheadedness or feel faint. °· You have shortness of breath or chest  pain. °· You have persistent pain. °Document Released: 06/30/2010 Document Revised: 01/20/2013 Document Reviewed: 09/03/2012 °ExitCare® Patient Information ©2015 [employer]. This information is not intended to replace advice given to you by your health care provider. Make sure you discuss any questions you have with your health care provider. ° °

## 2013-10-30 ENCOUNTER — Encounter (HOSPITAL_COMMUNITY): Payer: Self-pay

## 2013-10-30 DIAGNOSIS — J189 Pneumonia, unspecified organism: Principal | ICD-10-CM

## 2013-10-30 DIAGNOSIS — S27309D Unspecified injury of lung, unspecified, subsequent encounter: Secondary | ICD-10-CM

## 2013-10-30 DIAGNOSIS — J80 Acute respiratory distress syndrome: Secondary | ICD-10-CM

## 2013-10-30 LAB — GLUCOSE, CAPILLARY
Glucose-Capillary: 163 mg/dL — ABNORMAL HIGH (ref 70–99)
Glucose-Capillary: 93 mg/dL (ref 70–99)
Glucose-Capillary: 188 mg/dL — ABNORMAL HIGH (ref 70–99)
Glucose-Capillary: 160 mg/dL — ABNORMAL HIGH (ref 70–99)

## 2013-10-30 NOTE — Progress Notes (Signed)
Placed pt on 6L of O2 via nasal cannula instead of venturi mask per pt request. Respiratory therapist stated to put pt on nasal cannula at 6L.  Will continue to monitor pt. Nelda MarseilleJenny Thacker, RN

## 2013-10-30 NOTE — Evaluation (Signed)
Physical Therapy Evaluation Patient Details Name: Max [name] MRN: [id number] DOB: [birth year] Today's Date: 10/30/2013   History of Present Illness  63 y.o. 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 Underwent VATS procedure 09/29/13.  Clinical Impression  Pt admitted with above. Pt currently with functional limitations due to the deficits listed below (see PT Problem List). Max [name] is very motivated to improve and willing to work with therapy this afternoon. Ambulates 3 bouts up to 70 feet while using a rolling walker at a min guard - min assist level. On 10 L supplemental O2, SpO2 was sporadic ranging from 71-92% during therapy regardless of whether pt was ambulating or at rest. Averaging low 80s (81-82%) during ambulation while on 10L supplemental oxygen. Eventually back to 92% SpO2 while in semi-supine position and performing pursed lip breathing. Pt short of breath throughout therapy session but reports he "feels fine." Pt will benefit from skilled PT to increase their independence and safety with mobility to allow discharge to the venue listed below.       Follow Up Recommendations Home health PT;Supervision for mobility/OOB    Equipment Recommendations  Rolling walker with 5" wheels    Recommendations for Other Services OT consult     Precautions / Restrictions Precautions Precautions: None Restrictions Weight Bearing Restrictions: No      Mobility  Bed Mobility Overal bed mobility: Modified Independent                Transfers Overall transfer level: Needs assistance Equipment used: Rolling walker (2 wheeled);Quad cane Transfers: Sit to/from Stand Sit to Stand: [facility] transfer comment: Min guard for safety. VC for hand and walker placement. Performed from lowest bed setting x3. using posterior knees for support initialy for posterior lean able to self correct.  Ambulation/Gait Ambulation/Gait  assistance: Min assist Ambulation Distance (Feet): 70 Feet (additonal bouts of 15 and 50) Assistive device: Rolling walker (2 wheeled);Straight cane Gait Pattern/deviations: Step-through pattern;Decreased stride length;Scissoring;Staggering right;Narrow base of support   Gait velocity interpretation: Below normal speed for age/gender General Gait Details: Educated on safe DME use with cane and rolling walker. Ultimately much safer with RW for support. Min assist intermittently for balance loss typically to pts right side; min guard with use of RW. VC frequently to keep wide base of support due to significant scissoring.  SpO2 in general comments.  Stairs            Wheelchair Mobility    Modified Rankin (Stroke Patients Only)       Balance Overall balance assessment: Needs assistance Sitting-balance support: No upper extremity supported;Feet supported Sitting balance-Leahy Scale: Good     Standing balance support: Single extremity supported Standing balance-Leahy Scale: Poor                               Pertinent Vitals/Pain Pain Assessment: 0-10 Pain Score: 5  Pain Location: back radiating down bilateral legs to posterior knee Pain Descriptors / Indicators: Sharp Pain Intervention(s): Monitored during session;Repositioned;Limited activity within patient's tolerance    Home Living Family/patient expects to be discharged to:: Private residence Living Arrangements: Spouse/significant other Available Help at Discharge: Friend(s);Available 24 hours/day ("girlfriend") Type of Home: Apartment Home Access: Level entry     Home Layout: One level Home Equipment: Cane - single point;Bedside commode      Prior Function Level of  Independence: Independent with assistive device(s)         Comments: Used cane for ambulation     Hand Dominance   Dominant Hand: Right    Extremity/Trunk Assessment   Upper Extremity Assessment: Defer to OT evaluation            Lower Extremity Assessment: Generalized weakness         Communication   Communication: No difficulties  Cognition Arousal/Alertness: Awake/alert Behavior During Therapy: WFL for tasks assessed/performed Overall Cognitive Status: Within Functional Limits for tasks assessed                      General Comments General comments (skin integrity, edema, etc.): SpO2 ranged from 71-92 percent while on 10 L supplemental O2. Averaged low 80s while ambulating; instructions for pursed lip breathing brought SpO2 up more significantly while seated vs ambulating. Pt was seated when dropped to 71% at one point. He was left in semi-supine position on 10L supplemental O2 and SpO2 at 92%.    Exercises        Assessment/Plan    PT Assessment Patient needs continued PT services  PT Diagnosis Difficulty walking;Abnormality of gait;Acute pain;Generalized weakness   PT Problem List Decreased strength;Decreased activity tolerance;Decreased balance;Decreased mobility;Decreased knowledge of use of DME;Decreased knowledge of precautions;Cardiopulmonary status limiting activity;Pain  PT Treatment Interventions DME instruction;Gait training;Functional mobility training;Therapeutic activities;Therapeutic exercise;Balance training;Neuromuscular re-education;Patient/family education   PT Goals (Current goals can be found in the Care Plan section) Acute Rehab PT Goals Patient Stated Goal: Get back to lifting weights PT Goal Formulation: With patient Time For Goal Achievement: 11/06/13 Potential to Achieve Goals: Fair    Frequency Min 3X/week   Barriers to discharge        Co-evaluation               End of Session Equipment Utilized During Treatment: Gait belt;Oxygen (10L Supplemental O2) Activity Tolerance: Treatment limited secondary to medical complications (Comment) (low oxygen) Patient left: in chair;with call bell/phone within reach Nurse Communication: Mobility  status;Other (comment) (O2 ranges)         Time: [id number] PT Time Calculation (min): 40 min   Charges:   PT Evaluation $Initial PT Evaluation Tier I: 1 Procedure PT Treatments $Gait Training: 8-22 mins $Therapeutic Activity: 8-22 mins   PT G Codes:         Max MerlesLogan Secor Deborra [name], [place]PT [phone]  [place]erton MountBarbour, Draedyn Weidinger S 10/30/2013, 1:54 PM

## 2013-10-30 NOTE — Progress Notes (Signed)
SIGNIFICANT EVENTS:  8/31 - admitted [facility] for presumed HCAP  9/3 CT chest > Extensive bilateral ground-glass attenuation airspace disease with superimposed interlobular septal thickening.  9/4 - minimal response to ABX, steroids added.  9/6 - Subjective improvement with steroids, PRN BiPAP  9/8 - CXR with severe bilateral infiltrates, persistent. C/w ALI  9/12 - found to be self-medicating with sedating meds.  9/16 - able to reduce FiO2 demand to venti mask, PRN BiPAP at night, off ABX  9/21 - still very slow to improve, transferred to [facility] for additional pulm eval. Started on high dose steroids from 9/21-9/24  9/21 ct chest > Persistent GGO with some fluctuation in distribution. No other significant change. Most c/w ARDS/ atypical PNA.  9/24 - CTS (Dr. Donata Clay) - Mini Anterior Thoracotomy -Lung Biopsy Left Upper Lobe x 2  10/24/13: Intermittently very agitated. Peep 8, fio2 35%, RESTART SOLUMEDROL 10/01 tolerating VM without distress. Transfer to med-surg ordered. Methylpred decreased 10.02 awake and alert with no distress on flor No new complaints. No distress. SpO2 96% on VM  Filed Vitals:   10/29/13 1133 10/29/13 1727 10/29/13 2143 10/30/13 0554  BP: 124/79 132/86 115/70 113/80  Pulse: 114 99 101 85  Temp: 97.8 F (36.6 C) 97.7 F (36.5 C) 97.4 F (36.3 C) 97.6 F (36.4 C)  TempSrc: Oral Oral Oral Oral  Resp: 22 22 22 20   Height:      Weight:      SpO2: 92% 91% 92% 95%   Pleasant, NAD No JVD Bilateral crackles, v mask 10 l/m flow RRR NABS No edema   Recent Labs Lab 10/24/13 0531 10/27/13 0341 10/29/13 0500  NA 141 141 136*  K 3.8 4.5 3.8  CL 103 99 97  CO2 28 30 27   BUN 18 28* 23  CREATININE 0.87 0.80 0.75  GLUCOSE 82 129* 260*    Recent Labs Lab 10/24/13 0531 10/27/13 0341 10/29/13 0500  HGB 11.5* 11.9* 12.3*  HCT 35.0* 35.9* 35.7*  WBC 14.0* 17.2* 15.6*  PLT 163 259 284    Dg Chest Port 1 View  10/29/2013   CLINICAL DATA:  Status post left VA  TLS, lung biopsy. Question pneumothorax.  EXAM: PORTABLE CHEST - 1 VIEW  COMPARISON:  Single view of the chest 10/28/2013.  FINDINGS: Left PICC remains in place. Small left pneumothorax is unchanged. The right lung is expanded. Left worse than right airspace disease is is unchanged. Heart size is normal.  IMPRESSION: No change in a small left apical pneumothorax. Left worse than right airspace disease is also unchanged.   Electronically Signed   By: Drusilla Kanner M.D.   On: 10/29/2013 07:32   Dg Chest Port 1 View  10/28/2013   CLINICAL DATA:  Hypertension, sleep apnea, post LEFT thoracostomy tube removal  EXAM: PORTABLE CHEST - 1 VIEW  COMPARISON:  Portable exam 1303 hr compared to 0441 hr  FINDINGS: LEFT arm PICC line tip projects over cavoatrial junction.  Interval removal of LEFT thoracostomy tube.  Persistent small LEFT apex pneumothorax little changed.  Enlargement of cardiac silhouette.  BILATERAL pulmonary infiltrates slightly greater on LEFT, little changed.  No mediastinal shift.  Bones unremarkable.  IMPRESSION: Persistent small apical LEFT pneumothorax following thoracostomy tube removal, not significantly changed from earlier exam.   Electronically Signed   By: Ulyses Southward M.D.   On: 10/28/2013 13:17     IMP: Acute hypoxic resp failure - overall slowly improving Severe diffuse ILD Lung bx c/w DAD -  unclear etiology H/O OSA - noncompliant Steroid induced hyperglycemia without prior dx of DM Chronic back pain Chronic clonazepam use Insomnia, chronic  PLAN:  Med-surg floor PT eval and Rx with increasing ambulation DC planning - might be ready first of next week Wean O2 as able - will need LTOT- document needs daily Taper steroids as able -currently on 60 mg solumedrol q 12 h(last decrease date 10/1) ,will need long slow taper to off as dictated by CXR response Cont PRN Percocet Increase clonazepam back to his usual dose of BID Return him to [facility]  But remain as a pulmonary  consult and we will see Monday for follow up.  Brett CanalesSteve Minor ACNP [facility] Pager [phone] till 3 pm If no answer page [phone] 10/30/2013, 9:43 AM  Patient seen and examined, agree with above note.  I dictated the care and orders written for this patient under my direction.  Alyson ReedyWesam G Yacoub, MD [phone]

## 2013-10-31 ENCOUNTER — Inpatient Hospital Stay (HOSPITAL_COMMUNITY): Payer: Medicaid Other

## 2013-10-31 DIAGNOSIS — J849 Interstitial pulmonary disease, unspecified: Secondary | ICD-10-CM

## 2013-10-31 LAB — CBC
HCT: 36 % — ABNORMAL LOW (ref 39.0–52.0)
Hemoglobin: 12.3 g/dL — ABNORMAL LOW (ref 13.0–17.0)
MCH: 31.9 pg (ref 26.0–34.0)
MCHC: 34.2 g/dL (ref 30.0–36.0)
MCV: 93.3 fL (ref 78.0–100.0)
Platelets: 323 10*3/uL (ref 150–400)
RBC: 3.86 MIL/uL — ABNORMAL LOW (ref 4.22–5.81)
RDW: 13.9 % (ref 11.5–15.5)
WBC: 16.3 10*3/uL — ABNORMAL HIGH (ref 4.0–10.5)

## 2013-10-31 LAB — BASIC METABOLIC PANEL
Anion gap: 15 (ref 5–15)
BUN: 23 mg/dL (ref 6–23)
CO2: 25 mEq/L (ref 19–32)
Calcium: 8.4 mg/dL (ref 8.4–10.5)
Chloride: 96 mEq/L (ref 96–112)
Creatinine, Ser: 0.81 mg/dL (ref 0.50–1.35)
GFR calc Af Amer: >90 mL/min (ref >90)
GFR calc non Af Amer: >90 mL/min (ref >90)
Glucose, Bld: 195 mg/dL — ABNORMAL HIGH (ref 70–99)
Potassium: 4 mEq/L (ref 3.7–5.3)
Sodium: 136 mEq/L — ABNORMAL LOW (ref 137–147)

## 2013-10-31 LAB — GLUCOSE, CAPILLARY
Glucose-Capillary: 150 mg/dL — ABNORMAL HIGH (ref 70–99)
Glucose-Capillary: 121 mg/dL — ABNORMAL HIGH (ref 70–99)
Glucose-Capillary: 194 mg/dL — ABNORMAL HIGH (ref 70–99)

## 2013-10-31 MED ORDER — SUCRALFATE 1 G PO TABS
1.0000 g | ORAL_TABLET | Freq: Three times a day (TID) | ORAL | Status: DC
  Administered 2013-10-31 – 2013-11-08 (×25): 1 g via ORAL
  Filled 2013-10-31 (×27): qty 1

## 2013-10-31 NOTE — Progress Notes (Signed)
Pt ambulated in the hall on 35% O2 venturi mask. O2 sat dropped quickly to 67%. Pt returned to room. O2 sat up to 91% after 10 minutes of resting on side of bed. Will continue to monitor. Hortencia ConradiWendi Vedika Dumlao, RN

## 2013-10-31 NOTE — Progress Notes (Signed)
Subjective: Stable overnight with no increased wob.  Requiring nasal oxygen with venturi intermittantly.  Tells me he has been walking around room without difficulties.Minimal cough or mucus  Objective: Vital signs in last 24 hours: Blood pressure 104/75, pulse 78, temperature 97.9 F (36.6 C), temperature source Oral, resp. rate 20, height 5\' 10"  (1.778 m), weight 105.008 kg (231 lb 8 oz), SpO2 90.00%.  Intake/Output from previous day: 10/02 0701 - 10/03 0700 In: 565 [P.O.:565] Out: 850 [Urine:850]   Physical Exam:   obese male in nad Nose without purulence or d/c noted. Neck without LN or TMG Chest with crackles 1/3 up, no wheezing. Cor with rrr abd soft, nt/nd, bs+ LE with minimal edema, no cyanosis Alert and orieinted, moves all 4.    Lab Results:  Recent Labs  10/29/13 0500 10/31/13 0525  WBC 15.6* 16.3*  HGB 12.3* 12.3*  HCT 35.7* 36.0*  PLT 284 323   BMET  Recent Labs  10/29/13 0500 10/31/13 0525  NA 136* 136*  K 3.8 4.0  CL 97 96  CO2 27 25  GLUCOSE 260* 195*  BUN 23 23  CREATININE 0.75 0.81  CALCIUM 8.5 8.4    Studies/Results: Dg Chest 2 View  10/31/2013   CLINICAL DATA:  Diffuse alveolar damage. Shortness of breath and hypertension.  EXAM: CHEST  2 VIEW  COMPARISON:  10/29/2013  FINDINGS: Left PICC remains in place with tip near the cavoatrial junction. Cardiomediastinal silhouette is partially obscured but appears unchanged and overall within normal limits. Small left apical pneumothorax on the prior study is no longer definitely identified. Coarse, diffuse bilateral lung opacities, left greater than right, do not appear significantly changed. There is elevation of the right hemidiaphragm. No definite pleural effusion is identified.  IMPRESSION: 1. Small left apical pneumothorax no longer definitely identified. 2. No significant interval change in diffuse, left greater than right lung infiltrates.   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  Unfortunately, VATS bx was not helpful.  Has reached a period of stability on steroids, but unclear whether this is acceptable for him to be able to go home.    -continue steroids -try and mobilize, and assess oxygen needs for possible d/c   Barbaraann ShareKeith M. Clance, M.D. 10/31/2013, 12:06 PM

## 2013-11-01 LAB — GLUCOSE, CAPILLARY
Glucose-Capillary: 136 mg/dL — ABNORMAL HIGH (ref 70–99)
Glucose-Capillary: 99 mg/dL (ref 70–99)

## 2013-11-01 NOTE — Plan of Care (Signed)
Problem: Phase II Progression Outcomes Goal: O2 sats > equal to 90% on RA or at baseline Outcome: Progressing O2 sat on ambulation 79%; needing 02 pt still on venturi mask with 02 Goal: Dyspnea controlled w/progressive activity Outcome: Not Progressing Patient on venturi mask continous

## 2013-11-01 NOTE — Progress Notes (Signed)
Subjective:  Still requiring high flow oxygen, with venturi currently.  Nurses have documented severe desaturation with ambulation  Objective: Vital signs in last 24 hours: Blood pressure 103/79, pulse 93, temperature 97.6 F (36.4 C), temperature source Oral, resp. rate 18, height 5\' 10"  (1.778 m), weight 105.008 kg (231 lb 8 oz), SpO2 94.00%.  Intake/Output from previous day: 10/03 0701 - 10/04 0700 In: 263 [P.O.:240; I.V.:23] Out: 1000 [Urine:1000]   Physical Exam:   obese male in nad Nose without purulence or d/c noted. Neck without LN or TMG Chest with crackles 1/3 up, no wheezing. Cor with rrr abd soft, nt/nd, bs+ LE with minimal edema, no cyanosis Alert and orieinted, moves all 4.    Lab Results:  Recent Labs  10/31/13 0525  WBC 16.3*  HGB 12.3*  HCT 36.0*  PLT 323   BMET  Recent Labs  10/31/13 0525  NA 136*  K 4.0  CL 96  CO2 25  GLUCOSE 195*  BUN 23  CREATININE 0.81  CALCIUM 8.4    Studies/Results: Dg Chest 2 View  10/31/2013   CLINICAL DATA:  Diffuse alveolar damage. Shortness of breath and hypertension.  EXAM: CHEST  2 VIEW  COMPARISON:  10/29/2013  FINDINGS: Left PICC remains in place with tip near the cavoatrial junction. Cardiomediastinal silhouette is partially obscured but appears unchanged and overall within normal limits. Small left apical pneumothorax on the prior study is no longer definitely identified. Coarse, diffuse bilateral lung opacities, left greater than right, do not appear significantly changed. There is elevation of the right hemidiaphragm. No definite pleural effusion is identified.  IMPRESSION: 1. Small left apical pneumothorax no longer definitely identified. 2. No significant interval change in diffuse, left greater than right lung infiltrates.   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  He had mild LL ground glass  infiltrates on ct A/P 08/2013, then 2 weeks later extensive ISLD bilat on CT chest.  VATS bx with DAD.  This is most c/w AIP unless there is some exposure that he has had that we are not aware of.   -continue steroids -I do not see how we can send the pt home with his high flow oxygen requirements.  I suspect he will either get better with steroids or flounder for a long period of time.    Barbaraann ShareKeith M. Margie Urbanowicz, M.D. 11/01/2013, 11:59 AM

## 2013-11-01 NOTE — Progress Notes (Signed)
SATURATION QUALIFICATIONS: (This note is used to comply with regulatory documentation for home oxygen)  Patient Saturations on Room Air at Rest = NA  Patient Saturations on Room Air while Ambulating = NA  Patient Saturations on 10 Liters of oxygen using Venturi 35% mask  while Ambulating = 79  Please briefly explain why patient needs home oxygen: Pt desaturate while ambulating

## 2013-11-02 LAB — GLUCOSE, CAPILLARY
Glucose-Capillary: 178 mg/dL — ABNORMAL HIGH (ref 70–99)
Glucose-Capillary: 134 mg/dL — ABNORMAL HIGH (ref 70–99)
Glucose-Capillary: 190 mg/dL — ABNORMAL HIGH (ref 70–99)

## 2013-11-02 MED ORDER — OXYCODONE HCL 5 MG PO TABS
5.0000 mg | ORAL_TABLET | Freq: Four times a day (QID) | ORAL | Status: DC | PRN
  Administered 2013-11-02 – 2013-11-05 (×11): 5 mg via ORAL
  Filled 2013-11-02 (×11): qty 1

## 2013-11-02 MED ORDER — PANTOPRAZOLE SODIUM 40 MG PO TBEC
40.0000 mg | DELAYED_RELEASE_TABLET | Freq: Every day | ORAL | Status: DC
  Administered 2013-11-02 – 2013-11-08 (×7): 40 mg via ORAL
  Filled 2013-11-02 (×6): qty 1

## 2013-11-02 MED ORDER — FAMOTIDINE 40 MG/5ML PO SUSR
40.0000 mg | Freq: Every day | ORAL | Status: DC
  Administered 2013-11-02 – 2013-11-08 (×7): 40 mg via ORAL
  Filled 2013-11-02 (×8): qty 5

## 2013-11-02 NOTE — Progress Notes (Signed)
Physical Therapy Treatment Patient Details Name: Max [name] MRN: [id number] DOB: 02/18/1950 Today's Date: 11/02/2013    History of Present Illness 63 y.o. 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 Underwent VATS procedure 09/29/13.    PT Comments    Pt motivated to return to PLOF.  De-sat with gait on 6L/min, but recovers within 2 minutes with sitting rest break.  Pt would benefit from OT consult.  Follow Up Recommendations  Home health PT;Supervision for mobility/OOB     Equipment Recommendations  Rolling walker with 5" wheels    Recommendations for Other Services OT consult     Precautions / Restrictions Precautions Precautions: None    Mobility  Bed Mobility Overal bed mobility: Modified Independent                Transfers Overall transfer level: Needs assistance Equipment used: Rolling walker (2 wheeled) Transfers: Sit to/from Stand Sit to Stand: Supervision         General transfer comment: sit to stand multiple reps  Ambulation/Gait Ambulation/Gait assistance: Min guard Ambulation Distance (Feet): 120 Feet (with 1 standing rest break) Assistive device: Rolling walker (2 wheeled) Gait Pattern/deviations: Step-through pattern     General Gait Details: Cueing throughout gait to inhale through nose.  Pt on 6 L/min o2 via nasal canula. After gait, de-sat to76% but went back to 92% in less than 2 minutes.   Stairs            Wheelchair Mobility    Modified Rankin (Stroke Patients Only)       Balance   Sitting-balance support: No upper extremity supported Sitting balance-Leahy Scale: Good     Standing balance support: Bilateral upper extremity supported Standing balance-Leahy Scale: Fair (with RW)                      Cognition Arousal/Alertness: Awake/alert Behavior During Therapy: WFL for tasks assessed/performed Overall Cognitive Status: Within Functional Limits for tasks  assessed                      Exercises General Exercises - Lower Extremity Long Arc Quad: 10 reps;Both;Seated Hip ABduction/ADduction: Both;10 reps;Seated Hip Flexion/Marching: Standing;Both;10 reps Mini-Sqauts: Strengthening;10 reps;Standing    General Comments General comments (skin integrity, edema, etc.): RN had stated that MD aware pt's o2 sats drop with gait, but to continue to ambulate.  Pt down to 6L now and on nasal canula.      Pertinent Vitals/Pain Pain Assessment: Faces Faces Pain Scale: No hurt    Home Living                      Prior Function            PT Goals (current goals can now be found in the care plan section) Acute Rehab PT Goals Patient Stated Goal: Get oxygen levels better. PT Goal Formulation: With patient Time For Goal Achievement: 11/06/13 Potential to Achieve Goals: Good Progress towards PT goals: Progressing toward goals    Frequency  Min 3X/week    PT Plan Current plan remains appropriate    Co-evaluation             End of Session Equipment Utilized During Treatment: Gait belt;Oxygen Activity Tolerance: Other (comment) (decreased o2 sats with gait, MD and RN aware) Patient left: in bed;with call bell/phone within reach     Time: 1136-1202 PT Time Calculation (min): 26  min  Charges:  $Gait Training: 8-22 mins $Therapeutic Exercise: 8-22 mins                    G Codes:      Belinda Schlichting LUBECK 11/02/2013, 12:30 PM

## 2013-11-02 NOTE — Progress Notes (Signed)
NUTRITION FOLLOW-UP  INTERVENTION:  Continue to encourage PO intake  RD will continue to monitor  NUTRITION DIAGNOSIS: Increased nutrient needs related to acute hypoxic respiratory failure as evidenced by guidelines for estimated nutrition need; improving.   Goal: Pt to meet >/= 90% of their estimated nutrition needs, met   Monitor: Po intake, labs and wt trends   ASSESSMENT: Admission diagnosis healthcare associated pneumonia and acute hypoxic respiratory failure. His hx includes chronic pain, PTSD, constipation and GERD. Pt underwent left VATS for bx 9/24 and remained intubated until 9/28.  Pt current weight is 231 lb, on 9/1 pt was 249 lb (7% wt loss x 1 month). Ensure supplement was discontinued 10/1.  PO intake is 100%, pt states that he is eating great after being on a liquid diet for 7 weeks. Pt is not interested in receiving Ensure supplements again.   Labs reviewed: Low Na Glucose: 195  Height: Ht Readings from Last 1 Encounters:  10/22/13 _0  (1.778 m)    Weight: Wt Readings from Last 1 Encounters:  10/28/13 231 lb 8 oz (105.008 kg)    BMI:  Body mass index is 33.22 kg/(m^2). obesity class I  Estimated Nutritional Needs: Kcal: 8333-8329  Protein: 117-140 gr  Fluid: 1.7-2.0 liters daily  Skin:  Left chest incision +1 edema RLE and LLE  Diet Order: Cardiac  Meal Completion: 100%    Intake/Output Summary (Last 24 hours) at 11/02/13 0913 Last data filed at 11/02/13 0857  Gross per 24 hour  Intake     20 ml  Output    700 ml  Net   -680 ml    Last BM: 10/5  Labs:   Recent Labs Lab 10/27/13 0341 10/29/13 0500 10/31/13 0525  NA 141 136* 136*  K 4.5 3.8 4.0  CL 99 97 96  CO2 _1 BUN 28* 23 23  CREATININE 0.80 0.75 0.81  CALCIUM 8.7 8.5 8.4  GLUCOSE 129* 260* 195*    CBG (last 3)   Recent Labs  11/01/13 0743 11/01/13 1158 11/02/13 0804  GLUCAP 136* 99 178*    Scheduled Meds: . bisacodyl  10 mg Rectal Daily  .  clonazePAM  1 mg Oral BID  . docusate  50 mg Per Tube QHS  . enoxaparin (LOVENOX) injection  40 mg Subcutaneous Q24H  . escitalopram  20 mg Oral Daily  . gabapentin  400 mg Oral TID  . methylPREDNISolone (SOLU-MEDROL) injection  60 mg Intravenous Q12H  . nystatin cream   Topical BID  . polyethylene glycol  17 g Oral TID  . sennosides  5 mL Per Tube QHS  . sodium chloride  10-40 mL Intracatheter Q12H  . sucralfate  1 g Oral TID WC  . zolpidem  10 mg Oral QHS    Continuous Infusions: . sodium chloride 10 mL/hr at 10/26/13 2000   Clayton Bibles, MS, RD, [facility] Provisionally Licensed Dietitian Nutritionist Pager: [phone]

## 2013-11-02 NOTE — Progress Notes (Signed)
11 Days Post-Op Procedure(s) (LRB): VIDEO ASSISTED THORACOSCOPY (VATS)/WEDGE RESECTION (Left) Subjective: Recovering from L open lung biopsy for interstitial pneumonitis Chest tube is out, no effusion or pntx Min drainage from medial  aspect of incision- on hi dose steroids On 6 L O2 [place] to maintain sats Objective: Vital signs in last 24 hours: Temp:  [97.5 F (36.4 C)-97.7 F (36.5 C)] 97.5 F (36.4 C) (10/05 0500) Pulse Rate:  [88-96] 96 (10/05 0500) Cardiac Rhythm:  [-]  Resp:  [17-20] 17 (10/05 0500) BP: (112-114)/(75) 112/75 mmHg (10/05 0500) SpO2:  [77 %-95 %] 79 % (10/05 1031) FiO2 (%):  [30 %] 30 % (10/05 0622)  Hemodynamic parameters for last 24 hours:  afebrile nsr  Intake/Output from previous day: 10/04 0701 - 10/05 0700 In: -  Out: 700 [Urine:700] Intake/Output this shift: Total I/O In: 260 [P.O.:240; I.V.:20] Out: -   Incision examined and covered with new dressing Chest tube suture remains in place  Lab Results:  Recent Labs  10/31/13 0525  WBC 16.3*  HGB 12.3*  HCT 36.0*  PLT 323   BMET:  Recent Labs  10/31/13 0525  NA 136*  K 4.0  CL 96  CO2 25  GLUCOSE 195*  BUN 23  CREATININE 0.81  CALCIUM 8.4    PT/INR: No results found for this basename: LABPROT, INR,  in the last 72 hours ABG    Component Value Date/Time   PHART 7.453* 10/23/2013 0348   HCO3 28.4* 10/23/2013 0348   TCO2 29.7 10/23/2013 0348   O2SAT 96.4 10/23/2013 0348   CBG (last 3)   Recent Labs  11/01/13 0743 11/01/13 1158 11/02/13 0804  GLUCAP 136* 99 178*    Assessment/Plan: S/P Procedure(s) (LRB): VIDEO ASSISTED THORACOSCOPY (VATS)/WEDGE RESECTION (Left) Daily dressing changes I will follow pt in office after DC   LOS: 35 days    VAN TRIGT III,Max [name] 11/02/2013

## 2013-11-02 NOTE — Progress Notes (Signed)
Subjective:  Remains on 30% venturi (staff MD note - down to [facility] and looks much better than 2 weeks ago).  Afebrile.  Pt with lots of questions regarding fibrosis diagnosis.   Pulmonary History: Smoking:  Started smoking cigarettes at age 63, 1 pk per 3 days.  Has smoked heavy pot "all his life".   Work:  Was in [employer], toured in [place], potential agent orange exposure ("was near the outer parameters of exposure zone").  Worked Holiday representative, asbestos exposures.   Environment:  [place] in the city [place] most of [place], bats exposure with military svc   Objective: Vital signs in last 24 hours: Blood pressure 112/75, pulse 96, temperature 97.5 F (36.4 C), temperature source Oral, resp. rate 17, height 5\' 10"  (1.778 m), weight 231 lb 8 oz (105.008 kg), SpO2 90.00%.  Intake/Output from previous day: 10/04 0701 - 10/05 0700 In: -  Out: 700 [Urine:700]  Physical Exam: Obese male in nad Nose without purulence or d/c noted. Neck without LN or TMG Chest with dry crackles 1/3 up, no wheezing. Cor with rrr Abd soft, nt/nd, bs+ LE with minimal edema, no cyanosis Alert and orieinted, moves all 4.    Lab Results:  PULMONARY No results found for this basename: PHART, PCO2, PCO2ART, PO2, PO2ART, HCO3, TCO2, O2SAT,  in the last 168 hours  CBC  Recent Labs Lab 10/27/13 0341 10/29/13 0500 10/31/13 0525  HGB 11.9* 12.3* 12.3*  HCT 35.9* 35.7* 36.0*  WBC 17.2* 15.6* 16.3*  PLT 259 284 323    COAGULATION No results found for this basename: INR,  in the last 168 hours  CARDIAC  No results found for this basename: TROPONINI,  in the last 168 hours No results found for this basename: PROBNP,  in the last 168 hours   CHEMISTRY  Recent Labs Lab 10/27/13 0341 10/29/13 0500 10/31/13 0525  NA 141 136* 136*  K 4.5 3.8 4.0  CL 99 97 96  CO2 30 27 25   GLUCOSE 129* 260* 195*  BUN 28* 23 23  CREATININE 0.80 0.75 0.81  CALCIUM 8.7 8.5 8.4   Estimated  Creatinine Clearance: 111.8 ml/min (by C-G formula based on Cr of 0.81).   LIVER No results found for this basename: AST, ALT, ALKPHOS, BILITOT, PROT, ALBUMIN, INR,  in the last 168 hours   INFECTIOUS No results found for this basename: LATICACIDVEN, PROCALCITON,  in the last 168 hours   ENDOCRINE CBG (last 3)   Recent Labs  11/01/13 0743 11/01/13 1158 11/02/13 0804  GLUCAP 136* 99 178*         IMAGING x48h No results found.    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  He had mild LL ground glass infiltrates on CT A/P 08/2013, then 2 weeks later extensive ISLD bilat on CT chest.  VATS bx with DAD with baseline fibrosis nos (not UIP, no granuloma, automimune negative(.  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 No evidence this is UIP flare. No evidence of asbestosis (staff MD comment() L Apical PTX - resolved Leukocytosis - secondary to steroid administration     - 11/02/13: sitting at 6L [place] pulse ox 91%, drops to high 70s walking hallway (staff MD comment)   Plan: -continue steroids IV  -  ADD GERD Rx - PPI + H2 blockade  (staff MD comment) -will need pulmonary follow up with Dr. Marchelle Gearingamaswamy for ISLD -wean O2 for sats >88% -we can not send the pt home with his high flow oxygen requirements.  Hopeful he will either get better with steroids but he may flounder for a long period of time. - weeks to months - wioll get care mgmt to look for LTACT (staff md comment( -discontinue morphine, add additional 5 oxy IR for breakthrough pain relief -f/u cxr in am  -monitor fever curve / leukocytosis  -will need PICC line d/c'd at time of discharge (maybe)  Canary BrimBrandi Ollis, NP-C [place] [facility] Pgr: [phone] or [phone]  11/02/2013,  9:56 AM    STAFF MD   - see my note above. Expect slow recovery hopefully. Continue steroids but Rx aggessively for gERD. This is DAD - AIP or GERD mediated   Dr. Kalman ShanMurali Eufelia Veno, M.D., [facility].C.P Pulmonary and Critical Care Medicine Staff Physician [facility] [place] [facility] Pager: [phone], If no answer or between  15:00h - 7:00h: call [phone]  11/02/2013 11:18 AM

## 2013-11-02 NOTE — Progress Notes (Signed)
Thank you for consult on [name]. He continues to be limited due to high oxygen needs. HHPT recommended for follow up. Will defer CIR consult for now.

## 2013-11-03 ENCOUNTER — Inpatient Hospital Stay (HOSPITAL_COMMUNITY): Payer: Medicaid Other

## 2013-11-03 DIAGNOSIS — K219 Gastro-esophageal reflux disease without esophagitis: Secondary | ICD-10-CM

## 2013-11-03 LAB — BASIC METABOLIC PANEL
Anion gap: 15 (ref 5–15)
BUN: 21 mg/dL (ref 6–23)
CO2: 29 mEq/L (ref 19–32)
Calcium: 9 mg/dL (ref 8.4–10.5)
Chloride: 95 mEq/L — ABNORMAL LOW (ref 96–112)
Creatinine, Ser: 0.79 mg/dL (ref 0.50–1.35)
GFR calc Af Amer: >90 mL/min (ref >90)
GFR calc non Af Amer: >90 mL/min (ref >90)
Glucose, Bld: 151 mg/dL — ABNORMAL HIGH (ref 70–99)
Potassium: 4.7 mEq/L (ref 3.7–5.3)
Sodium: 139 mEq/L (ref 137–147)

## 2013-11-03 LAB — GLUCOSE, CAPILLARY
Glucose-Capillary: 163 mg/dL — ABNORMAL HIGH (ref 70–99)
Glucose-Capillary: 184 mg/dL — ABNORMAL HIGH (ref 70–99)
Glucose-Capillary: 164 mg/dL — ABNORMAL HIGH (ref 70–99)
Glucose-Capillary: 188 mg/dL — ABNORMAL HIGH (ref 70–99)

## 2013-11-03 LAB — CBC
HCT: 41.7 % (ref 39.0–52.0)
Hemoglobin: 13.8 g/dL (ref 13.0–17.0)
MCH: 31.2 pg (ref 26.0–34.0)
MCHC: 33.1 g/dL (ref 30.0–36.0)
MCV: 94.1 fL (ref 78.0–100.0)
Platelets: 331 10*3/uL (ref 150–400)
RBC: 4.43 MIL/uL (ref 4.22–5.81)
RDW: 14.5 % (ref 11.5–15.5)
WBC: 27.7 10*3/uL — ABNORMAL HIGH (ref 4.0–10.5)

## 2013-11-03 MED ORDER — PREDNISONE 50 MG PO TABS
60.0000 mg | ORAL_TABLET | Freq: Every day | ORAL | Status: DC
  Administered 2013-11-04 – 2013-11-05 (×2): 60 mg via ORAL
  Filled 2013-11-03 (×4): qty 1

## 2013-11-03 MED ORDER — FUROSEMIDE 40 MG PO TABS
40.0000 mg | ORAL_TABLET | Freq: Once | ORAL | Status: AC
  Administered 2013-11-03: 40 mg via ORAL
  Filled 2013-11-03: qty 1

## 2013-11-03 NOTE — Progress Notes (Signed)
Patient ambulated approx 200 feet on 6L oxygen nasal cannula. Resting sats 91%. Ambulating sats dropped to 70%. Back to 91% resting. Pt tolerated ambulation well.

## 2013-11-03 NOTE — Clinical Social Work Psychosocial (Signed)
Clinical Social Work Department BRIEF PSYCHOSOCIAL ASSESSMENT 11/03/2013  Patient:  Max [name], Max [name]     Account Number:  [id number]     Admit date:  09/28/2013  Clinical Social Worker:  Lovey Newcomer  Date/Time:  11/03/2013 12:17 PM  Referred by:  Physician  Date Referred:  11/03/2013 Referred for  SNF Placement   Other Referral:   NA   Interview type:  Patient Other interview type:   Patient alert and oriented at time of assessment.    PSYCHOSOCIAL DATA Living Status:  SIGNIFICANT OTHER Admitted from facility:   Level of care:   Primary support name:  Derold Dorsch Primary support relationship to patient:  SIBLING Degree of support available:   Support is good.    CURRENT CONCERNS Current Concerns  Post-Acute Placement   Other Concerns:   NA    SOCIAL WORK ASSESSMENT / PLAN CSW met with patient at bedside along with BSW intern and RNCM. CSW explained to patient that patient is not able to go to [facility] at this time due to insurance and that his oxygen needs exceed the capabilities of home oxygen. Patient agrees and states that he will be agreeable to going to short term SNF placement until he can be weaned down to about 4L. CSW explained that many facilities cannot accommodate high flow rate oxygen and that his options will be limited by this, and his insurance (medicaid) is also a limitation. Patient vernbalized understanding and is agreeable to SNF search. Patient reports that he lives with his significant other who is a great support to him. CSW will assist with DC.   Assessment/plan status:  Psychosocial Support/Ongoing Assessment of Needs Other assessment/ plan:   Complete Fl2, Fax, PASRR   Information/referral to community resources:   CSW contact information given.    PATIENT'S/FAMILY'S RESPONSE TO PLAN OF CARE: Patient is agreeable to SNF placement at discharge. Patient is hopefull for DC to [facility] in [place].       Liz Beach  MSW, [place], [place], [phone]

## 2013-11-03 NOTE — Progress Notes (Signed)
Patient complaining of indigestion. No prns ordered. Notified critical care on call. Stated they would place orders.

## 2013-11-03 NOTE — Progress Notes (Signed)
Pulmonary History: Smoking:  Started smoking cigarettes at age 63, 1 pk per 3 days.  Has smoked heavy pot "all his life".   Work:  Was in [employer], [place], potential agent orange exposure ("was near the outer parameters of exposure zone").  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 Desats with activity to 75% (walk hallway and back) but feeling better slowly,  Objective: Vital signs in last 24 hours: Blood pressure 106/84, pulse 84, temperature 97.6 F (36.4 C), temperature source Oral, resp. rate 18, height 5\' 10"  (1.778 m), weight 231 lb 8 oz (105.008 kg), SpO2 95.00%.  Intake/Output from previous day: 10/05 0701 - 10/06 0700 In: 260 [P.O.:240; I.V.:20] Out: 1300 [Urine:1300]  Physical Exam: White male in nad sitting in chair Nose without purulence or d/c noted. Neck without LN or TMG Chest with dry crackles 1/3 up, no wheezing. Cor with rrr Abd soft, nt/nd, bs+ LE with minimal edema, no cyanosis Alert and orieinted, moves all 4.  Wants to go home this week  Lab Results:  PULMONARY No results found for this basename: PHART, PCO2, PCO2ART, PO2, PO2ART, HCO3, TCO2, O2SAT,  in the last 168 hours  CBC  Recent Labs Lab 10/29/13 0500 10/31/13 0525 11/03/13 0530  HGB 12.3* 12.3* 13.8  HCT 35.7* 36.0* 41.7  WBC 15.6* 16.3* 27.7*  PLT 284 323 331    COAGULATION No results found for this basename: INR,  in the last 168 hours  CARDIAC  No results found for this basename: TROPONINI,  in the last 168 hours No results found for this basename: PROBNP,  in the last 168 hours   CHEMISTRY  Recent Labs Lab 10/29/13 0500 10/31/13 0525 11/03/13 0530  NA 136* 136* 139  K 3.8 4.0 4.7   CL 97 96 95*  CO2 27 25 29   GLUCOSE 260* 195* 151*  BUN 23 23 21   CREATININE 0.75 0.81 0.79  CALCIUM 8.5 8.4 9.0   Estimated Creatinine Clearance: 113.2 ml/min (by C-G formula based on Cr of 0.79).   LIVER No results found for this basename: AST, ALT, ALKPHOS, BILITOT, PROT, ALBUMIN, INR,  in the last 168 hours   INFECTIOUS No results found for this basename: LATICACIDVEN, PROCALCITON,  in the last 168 hours   ENDOCRINE CBG (last 3)   Recent Labs  11/02/13 1157 11/02/13 2146 11/03/13 0743  GLUCAP 134* 190* 163*  On solumedrol 60 mg q 12 h       IMAGING x48h Dg Chest 2 View  11/03/2013   CLINICAL DATA:  Shortness of breath, interstitial lung disease.  EXAM: CHEST  2 VIEW  COMPARISON:  October 31, 2013.  FINDINGS: Stable cardiomediastinal silhouette. Left-sided PICC line is unchanged in position with distal tip in expected position of the SVC. Minimal left apical pneumothorax is noted. Stable diffuse opacity is noted throughout both lungs, with left greater than right. Mild left pleural effusion is noted. Bony thorax is intact.  IMPRESSION: Stable bilateral lung opacities are noted which may represent edema or interstitial pneumonia. Minimal left apical pneumothorax is noted. These results will be called to the ordering clinician or representative by the Radiologist Assistant, and communication documented in the PACS  or zVision Dashboard.   Electronically Signed   By: Roque Lias M.D.   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  He had mild LL ground glass infiltrates on CT A/P 08/2013, then 2 weeks later extensive ISLD bilat on CT chest.  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  On 11/02/13: he gave hx of significant GERD cotnrolled with PPI leading upto admission and findings of esophageal thickening on 09/19/13 CT abdomen. 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 Marchelle Gearing for ILD or Tammy PArrett < 7-10 days from DC (staff MD comment) -wean O2 for sats >88% -aim to dc to SNF 11/04/13 for 1 week before going home (bed available at [facility], staff MD comment) - 5 oxy IR for breakthrough pain relief -f/u cxr as needed -monitor fever curve / leukocytosis  - . One dose PO lasix as bun/creat ok.  Brett Canales Minor ACNP [facility] PCCM Pager [phone] till 3 pm If no answer page [phone] 11/03/2013, 9:51 AM   STaff note   - personally evaluated patient. He is same as yesterday. Desats with activity on 6L [place]. CXR 11/03/2013 is improved compared to 10 days ago. Possible small left ptx stable. Change to po steroids. Aim to SNF dc 10.7.15. I updated him. No family at bedside. He will fu with me or NP < 7-10 days from dc. PRognosis uncertain. Rest per NP   Dr. Kalman Shan, M.D., [facility].C.P Pulmonary and Critical Care Medicine Staff Physician [facility] [place] [facility] Pager: [phone], If no answer or between  15:00h - 7:00h: call [phone]  11/03/2013 11:23 AM

## 2013-11-03 NOTE — Clinical Social Work Placement (Signed)
Clinical Social Work Department CLINICAL SOCIAL WORK PLACEMENT NOTE 11/03/2013  Patient:  Max [name],Max [name]  Account Number:  [id number] Admit date:  09/28/2013  Clinical Social Worker:  Cherre BlancJOSEPH BRYANT [name], [place]  Date/time:  11/03/2013 04:00 PM  Clinical Social Work is seeking post-discharge placement for this patient at the following level of care:   SKILLED NURSING   (*CSW will update this form in Epic as items are completed)   11/03/2013  [facility]family [facility] with [facility]'s list of facilities offering this level of care within the geographic area requested by the patient (or if unable, by the patient's family).  11/03/2013  Patient/family informed of their freedom to choose among providers that offer the needed level of care, that participate in Medicare, Medicaid or managed care program needed by the patient, have an available bed and are willing to accept the patient.  11/03/2013  Patient/family informed of [facility]' ownership interest in [facility], as well as of the fact that they are under no obligation to receive care at this facility.  PASARR submitted to [employer] on 11/03/2013 PASARR number received on   FL2 transmitted to all facilities in geographic area requested by pt/family on  11/03/2013 FL2 transmitted to all facilities within larger geographic area on 11/03/2013  Patient informed that his/her managed care company has contracts with or will negotiate with  certain facilities, including the following:     Patient/family informed of bed offers received:   Patient chooses bed at  Physician recommends and patient chooses bed at    Patient to be transferred to  on   Patient to be transferred to facility by  Patient and family notified of transfer on  Name of family member notified:    The following physician request were entered in Epic: Physician Request  Please sign FL2.    Additional Comments:  MD,  please sign FL2 and 30 day note in patient's chart for patient's PASRR number.    Roddie McBryant Thania Woodlief MSW, [place], [place], [phone]

## 2013-11-03 NOTE — Progress Notes (Signed)
SATURATION QUALIFICATIONS: (This note is used to comply with regulatory documentation for home oxygen)  Patient Saturations on Room Air at Rest = n/a  Patient Saturations on Room Air while Ambulating = n/a  Patient Saturations on 6Liters of oxygen while Ambulating = 75-78%  Please briefly explain why patient needs home oxygen: Pt desaturate while ambulate

## 2013-11-04 ENCOUNTER — Inpatient Hospital Stay (HOSPITAL_COMMUNITY): Payer: Medicaid Other

## 2013-11-04 ENCOUNTER — Encounter (HOSPITAL_COMMUNITY): Payer: Self-pay | Admitting: Radiology

## 2013-11-04 LAB — GLUCOSE, CAPILLARY
Glucose-Capillary: 138 mg/dL — ABNORMAL HIGH (ref 70–99)
Glucose-Capillary: 145 mg/dL — ABNORMAL HIGH (ref 70–99)
Glucose-Capillary: 125 mg/dL — ABNORMAL HIGH (ref 70–99)

## 2013-11-04 MED ORDER — MIDAZOLAM HCL 2 MG/2ML IJ SOLN
INTRAMUSCULAR | Status: AC
  Filled 2013-11-04: qty 2

## 2013-11-04 MED ORDER — LIDOCAINE HCL 1 % IJ SOLN
INTRAMUSCULAR | Status: AC
  Filled 2013-11-04: qty 20

## 2013-11-04 MED ORDER — FENTANYL CITRATE 0.05 MG/ML IJ SOLN
25.0000 ug | INTRAMUSCULAR | Status: DC | PRN
  Administered 2013-11-05 – 2013-11-08 (×15): 50 ug via INTRAVENOUS
  Filled 2013-11-04 (×14): qty 2

## 2013-11-04 MED ORDER — FENTANYL CITRATE 0.05 MG/ML IJ SOLN
INTRAMUSCULAR | Status: AC
  Filled 2013-11-04: qty 2

## 2013-11-04 MED ORDER — FENTANYL CITRATE 0.05 MG/ML IJ SOLN
INTRAMUSCULAR | Status: AC | PRN
  Administered 2013-11-04: 50 ug via INTRAVENOUS

## 2013-11-04 MED ORDER — MIDAZOLAM HCL 2 MG/2ML IJ SOLN
INTRAMUSCULAR | Status: AC | PRN
  Administered 2013-11-04: 1 mg via INTRAVENOUS

## 2013-11-04 NOTE — Sedation Documentation (Addendum)
Pt still conversing, stated he is very hungry. No signs of pain, no moaning or grimacing. VSS.

## 2013-11-04 NOTE — H&P (Signed)
Agree.  For CT guided left pigtail chest tube placement today.

## 2013-11-04 NOTE — H&P (Signed)
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 97 96 95*  CO2 30 27 25 29   GLUCOSE 129* 260* 195* 151*  BUN 28* 23 23 21   CALCIUM 8.7 8.5 8.4 9.0  CREATININE 0.80 0.75 0.81 0.79  GFRNONAA >90 >90 >90 >90  GFRAA >90 >90 >90 >90    LIVER FUNCTION TESTS:  Recent Labs  10/05/13 0439 10/15/13 0407 10/21/13 0500 10/24/13 0531  BILITOT  0.3 0.5 0.3 0.5  AST 44* 15 15 13   ALT 145* 45 42 38  ALKPHOS 122* 124* 94 71  PROT 6.4 7.5 6.4 5.7*  ALBUMIN 2.3* 3.1* 2.5* 2.5*    TUMOR MARKERS: No results found for this basename: AFPTM, CEA, CA199, CHROMGRNA,  in the last 8760 hours  Assessment and Plan: 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 Fredia Sorrow and Dr. Donata Clay urgency of procedure. Risks and Benefits discussed with the patient. All of the patient's questions were answered, patient is agreeable to proceed. Consent signed and in  chart. Leukocytosis on prednisone   Thank you for this interesting consult.  I greatly enjoyed meeting Max [name] and look forward to participating in their care.   I spent a total of 40 minutes face to face in clinical consultation, greater than 50% of which was counseling/coordinating care   Signed: Berneta Levins 11/04/2013, 11:32 AM

## 2013-11-04 NOTE — Progress Notes (Signed)
Paged provider on call about pt getting more pain medicine. No more medicine to be given at this time.

## 2013-11-04 NOTE — Procedures (Signed)
Procedure:  CT guided left tube thoracostomy Findings:  Small anterior left PTX.  12 Fr pigtail catheter placed in pleural space.  Aspiration resulted in complete evacuation of PTX.  Tube connected to [facility].  Will place to wall suction at -20 mm Hg.

## 2013-11-04 NOTE — Progress Notes (Addendum)
      [address]       [place] [id number]             [phone]       13 Days Post-Op Procedure(s) (LRB): VIDEO ASSISTED THORACOSCOPY (VATS)/WEDGE RESECTION (Left)  Subjective: Patient hoping to go home, but states someone said he may need another chest tube.  Objective: Vital signs in last 24 hours: Temp:  [97.5 F (36.4 C)-97.9 F (36.6 C)] 97.5 F (36.4 C) (10/07 0540) Pulse Rate:  [76-103] 76 (10/07 0540) Cardiac Rhythm:  [-]  Resp:  [18] 18 (10/07 0540) BP: (128-144)/(83-92) 128/83 mmHg (10/07 0540) SpO2:  [83 %-99 %] 97 % (10/07 0540)     Intake/Output from previous day: 10/06 0701 - 10/07 0700 In: 20 [I.V.:20] Out: -    Physical Exam:  Cardiovascular: RRR Pulmonary: Some coarse breath sounds Abdomen: Soft, non tender, bowel sounds present. Wounds: Left chest tube site islean and dry.  No erythema or signs of infection. Edge of anterior left chest wound with slight skin separation. Serous drainage. NO purulence or erythema.    Lab Results: CBC: Recent Labs  11/03/13 0530  WBC 27.7*  HGB 13.8  HCT 41.7  PLT 331   BMET:  Recent Labs  11/03/13 0530  NA 139  K 4.7  CL 95*  CO2 29  GLUCOSE 151*  BUN 21  CREATININE 0.79  CALCIUM 9.0    PT/INR: No results found for this basename: LABPROT, INR,  in the last 72 hours ABG:  INR: Will add last result for INR, ABG once components are confirmed Will add last 4 CBG results once components are confirmed  Assessment/Plan:  1.  Pulmonary - acute respiratory failure secondary to ISLD. On Prednisone.  On 6 liters of oxygen. CXR today shows  increase right apical pneumothorax. Needs another chest tube. Discussed with Brett CanalesSteve Minor and Dr. Donata ClayVan Trigt. Will ask IR to place. 2. Management per medicine and pulmonary 3.Regarding anterior chest wound, dry 4x4 with tape. Change daily. Do not put betadine or neosporin on wound.  ZIMMERMAN,DONIELLE MPA-C 11/04/2013,10:19 AM  Left apical  pneumothorax has slowly increased in size and although not large it is interfering with his oxygenation problems. Will consult interventional radiology to place CT directed pigtail catheter to Pleur-evac. patient examined and medical record reviewed,agree with above note. VAN TRIGT III,PETER 11/04/2013

## 2013-11-04 NOTE — Sedation Documentation (Signed)
Pt talking w/ Dr. Fredia SorrowYamagata during procedure, no signs of pain noted.

## 2013-11-04 NOTE — Progress Notes (Signed)
Pulmonary History: Smoking:  Started smoking cigarettes at age 63, 1 pk per 3 days.  Has smoked heavy pot "all his life".   Work:  Was in [employer], tou[place] in [place], potential agent orange exposure ("was near the outer parameters of exposure zone").  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  9/4 - minimal response to ABX, steroids added.  9/6 - Subjective improvement with steroids, PRN BiPAP  9/8 - CXR with severe bilateral infiltrates, persistent. C/w ALI  9/12 - found to be self-medicating with sedating meds.  9/16 - able to reduce FiO2 demand to venti mask, PRN BiPAP at night, off ABX  9/21 - still very slow to improve, [facility]m [facility]. Started on high dose steroids from 9/21-9/24  9/21 ct chest > Persistent GGO with some fluctuation in distribution. No other significant change. Most c/w ARDS/ atypical PNA.  9/24 - CTS (Dr. Donata ClayVan Trigt) - Mini Anterior Thoracotomy -Lung Biopsy Left Upper Lobe x 2  10/24/13: Intermittently very agitated. Peep 8, fio2 35%, RESTART SOLUMEDROL  10/01 tolerating VM without distress. Transfer to med-surg ordered. Methylpred decreased  10.02 awake and alert with no distress on floor 10/6 try po steroids 10/7 left apical pnx worse. IR to place chest tube  Subjective:  O2 demands down  10/67/15: Pnx worse on cxr  Objective: Vital signs in last 24 hours: Blood pressure 128/83, pulse 76, temperature 97.5 F (36.4 C), temperature source Oral, resp. rate 18, height 5\' 10"  (1.778 m), weight 231 lb 8 oz (105.008 kg), SpO2 97.00%.  Intake/Output from previous day: 10/06 0701 - 10/07 0700 In: 20 [I.V.:20] Out: -   Physical  Exam: White male in nad sitting in chair Nose without purulence or d/c noted. Neck without LN or TMG Chest with decreased bs left Cor with rrr Abd soft, nt/nd, bs+ LE with minimal edema, no cyanosis Alert and orieinted, moves all 4.    Lab Results:  PULMONARY No results found for this basename: PHART, PCO2, PCO2ART, PO2, PO2ART, HCO3, TCO2, O2SAT,  in the last 168 hours  CBC  Recent Labs Lab 10/29/13 0500 10/31/13 0525 11/03/13 0530  HGB 12.3* 12.3* 13.8  HCT 35.7* 36.0* 41.7  WBC 15.6* 16.3* 27.7*  PLT 284 323 331    COAGULATION No results found for this basename: INR,  in the last 168 hours  CARDIAC  No results found for this basename: TROPONINI,  in the last 168 hours No results found for this basename: PROBNP,  in the last 168 hours   CHEMISTRY  Recent Labs Lab 10/29/13 0500 10/31/13 0525 11/03/13 0530  NA 136* 136* 139  K 3.8 4.0 4.7  CL 97 96 95*  CO2 27 25 29   GLUCOSE 260* 195* 151*  BUN 23 23 21   CREATININE 0.75 0.81 0.79  CALCIUM 8.5 8.4 9.0   Estimated Creatinine Clearance: 113.2 ml/min (by C-G formula based on Cr of 0.79).   LIVER No results found for this basename: AST, ALT, ALKPHOS, BILITOT, PROT, ALBUMIN, INR,  in the last 168 hours   INFECTIOUS No results found for this basename: LATICACIDVEN, PROCALCITON,  in the last 168 hours   ENDOCRINE CBG (last 3)   Recent  Labs  11/03/13 1717 11/03/13 2210 11/04/13 0750  GLUCAP 164* 188* 138*  On solumedrol 60 mg q 12 h       IMAGING x48h Dg Chest 2 View  11/03/2013   CLINICAL DATA:  Shortness of breath, interstitial lung disease.  EXAM: CHEST  2 VIEW  COMPARISON:  October 31, 2013.  FINDINGS: Stable cardiomediastinal silhouette. Left-sided PICC line is unchanged in position with distal tip in expected position of the SVC. Minimal left apical pneumothorax is noted. Stable diffuse opacity is noted throughout both lungs, with left greater than right. Mild left pleural effusion is  noted. Bony thorax is intact.  IMPRESSION: Stable bilateral lung opacities are noted which may represent edema or interstitial pneumonia. Minimal left apical pneumothorax is noted. These results will be called to the ordering clinician or representative by the Radiologist Assistant, and communication documented in the PACS or zVision Dashboard.   Electronically Signed   By: Roque Lias M.D.   On: 11/03/2013 08:19   Dg Chest Port 1 View  11/04/2013   CLINICAL DATA:  Shortness of breath, postop VATS and left lung resection. Acute respiratory failure.  EXAM: PORTABLE CHEST - 1 VIEW  COMPARISON:  11/03/2013 and CT chest 10/19/2013.  FINDINGS: Trachea is midline. Heart is enlarged, stable. Left PICC tip projects over the SVC RA junction. Lungs are low in volume with diffuse mixed interstitial and airspace disease. Associated coarsening is better seen on prior exams. Small left apical pneumothorax, slightly increased. No definite pleural fluid.  IMPRESSION: 1. Slight increase in small left apical pneumothorax. 2. Low lung volumes with diffuse mixed interstitial and airspace disease, as before. Associated coarsening is better appreciated on prior exams and is indicative of interstitial lung disease, recently biopsied.   Electronically Signed   By: Leanna Battles M.D.   On: 11/04/2013 08:31      Assessment/Plan:  Acute respiratory failure secondary to ISLD of unknown origin.  He had mild LL ground glass infiltrates on CT A/P 08/2013, then 2 weeks later extensive ISLD bilat on CT chest.  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  On 11/02/13: he gave hx of significant GERD cotnrolled with PPI leading upto admission and findings of esophageal thickening on 09/19/13 CT abdomen. 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 Marchelle Gearing for ILD or Tammy PArrett < 7-10 days from DC  -wean O2 for sats >88% -aim to dc to SNF 11/04/13 for 1 week before going home (bed available at [facility], staff MD comment) - 5 oxy IR for breakthrough pain relief -f/u cxr as needed -monitor fever curve / leukocytosis  -CVTS has order chest per IR , this will delay transfer to snf.  Brett Canales Minor ACNP [facility] Pager [phone] till 3 pm If no answer page [phone] 11/04/2013, 10:32 AM    STAFF NOTE - - personally evaluted patient 11/04/2013 He looks stable on 6L [place] and dressed up to go to SNF. However, left ptx is now worse. CVTS called for chest tube placement. Cancel SNF transfer. This is a set back. REst per NP  Dr. Kalman Shan,  M.D., F.C.C.P Pulmonary and Critical Care Medicine Staff Physician [facility] [place] [facility] Pager: [phone], If no answer or between  15:00h - 7:00h: call [phone]  11/04/2013 10:56 AM

## 2013-11-04 NOTE — Progress Notes (Signed)
PT Cancellation Note  Patient Details Name: Vicente SereneJerry D Linam MRN: [id number] DOB: [birth year]   Cancelled Treatment:    Reason Eval/Treat Not Completed: Patient at procedure or test/unavailable, pt getting ready to go down to procedure, will check back tomorrow.   Nekoda Chock, [place] 11/04/2013, 2:38 PM

## 2013-11-05 ENCOUNTER — Inpatient Hospital Stay (HOSPITAL_COMMUNITY): Payer: Medicaid Other

## 2013-11-05 DIAGNOSIS — J939 Pneumothorax, unspecified: Secondary | ICD-10-CM

## 2013-11-05 LAB — GLUCOSE, CAPILLARY
Glucose-Capillary: 95 mg/dL (ref 70–99)
Glucose-Capillary: 175 mg/dL — ABNORMAL HIGH (ref 70–99)
Glucose-Capillary: 210 mg/dL — ABNORMAL HIGH (ref 70–99)

## 2013-11-05 MED ORDER — PREDNISONE 50 MG PO TABS
50.0000 mg | ORAL_TABLET | Freq: Every day | ORAL | Status: DC
  Administered 2013-11-06 – 2013-11-08 (×3): 50 mg via ORAL
  Filled 2013-11-05 (×4): qty 1

## 2013-11-05 MED ORDER — OXYCODONE-ACETAMINOPHEN 5-325 MG PO TABS
2.0000 | ORAL_TABLET | Freq: Four times a day (QID) | ORAL | Status: DC | PRN
  Administered 2013-11-05 – 2013-11-08 (×8): 2 via ORAL
  Filled 2013-11-05 (×9): qty 2

## 2013-11-05 MED ORDER — SODIUM CHLORIDE 0.9 % IV SOLN
INTRAVENOUS | Status: DC
  Administered 2013-11-05: 17:00:00 via INTRAVENOUS

## 2013-11-05 NOTE — Progress Notes (Signed)
Occupational Therapy Evaluation Patient Details Name: Max SereneJerry D Pescador MRN: [id number] DOB: [birth year] Today's Date: 11/05/2013    History of Present Illness 63 y.o. 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 Underwent VATS procedure 09/29/13.   Clinical Impression   Patient presents to OT with decreased ADL independence and safety due to decreased activity tolerance and O2 desaturations. Began education on energy conservation techniques. Patient needs further education next session.    Follow Up Recommendations  SNF    Equipment Recommendations  Other (comment) (tbd at SNF)    Recommendations for Other Services       Precautions / Restrictions Precautions Precautions: None Precaution Comments: chest tube placed 10/7 Restrictions Weight Bearing Restrictions: No      Mobility Bed Mobility Overal bed mobility: Modified Independent                Transfers     Transfers: Sit to/from Stand Sit to Stand: Modified independent (Device/Increase time)         General transfer comment:  (stood from recliner without A)    Balance                                            ADL Overall ADL's : Needs assistance/impaired Eating/Feeding: Independent;Sitting   Grooming: Wash/dry hands;Wash/dry face;Set up   Upper Body Bathing: Minimal assitance   Lower Body Bathing: Minimal assistance   Upper Body Dressing : Minimal assistance   Lower Body Dressing: Minimal assistance                 General ADL Comments: Pt declined EOB/OOB due to pain from chest tube. Patient recently had pain medications and reports it is feeling better. Observed pt earlier sitting EOB eating lunch that family brought without difficulty. Issued energy conservation handout and began education. Needs further education next session.     Vision                     Perception     Praxis      Pertinent Vitals/Pain Pain  Assessment: 0-10 Pain Score: 7  Pain Location: chest tube insertion site Pain Descriptors / Indicators: Aching;Sore Pain Intervention(s): Limited activity within patient's tolerance     Hand Dominance Right   Extremity/Trunk Assessment Upper Extremity Assessment Upper Extremity Assessment: Overall WFL for tasks assessed   Lower Extremity Assessment Lower Extremity Assessment: Defer to PT evaluation       Communication Communication Communication: No difficulties   Cognition Arousal/Alertness: Awake/alert Behavior During Therapy: WFL for tasks assessed/performed Overall Cognitive Status: Within Functional Limits for tasks assessed                     General Comments       Exercises       Shoulder Instructions      Home Living Family/patient expects to be discharged to:: Skilled nursing facility Living Arrangements: Spouse/significant other Available Help at Discharge: Friend(s);Available 24 hours/day Type of Home: Apartment Home Access: Level entry     Home Layout: One level               Home Equipment: Cane - single point;Bedside commode          Prior Functioning/Environment Level of Independence: Independent with assistive device(s)  OT Diagnosis: Acute pain   OT Problem List: Decreased activity tolerance;Cardiopulmonary status limiting activity   OT Treatment/Interventions: Self-care/ADL training;Therapeutic exercise;Energy conservation;DME and/or AE instruction;Therapeutic activities;Patient/family education    OT Goals(Current goals can be found in the care plan section) Acute Rehab OT Goals Patient Stated Goal: Get oxygen levels better. OT Goal Formulation: With patient Time For Goal Achievement: 11/19/13 Potential to Achieve Goals: Good  OT Frequency: Min 2X/week   Barriers to D/C:            Co-evaluation              End of Session    Activity Tolerance: Patient limited by pain Patient left: in  bed;with call bell/phone within reach   Time: 1220-1234 OT Time Calculation (min): 14 min Charges:  OT General Charges $OT Visit: 1 Procedure OT Evaluation $Initial OT Evaluation Tier I: 1 Procedure OT Treatments $Self Care/Home Management : 8-22 mins G-Codes:    Shamarcus Hoheisel A 11/19/2013, 12:41 PM

## 2013-11-05 NOTE — Progress Notes (Addendum)
      [address]       [place] [id number]             [phone]      14 Days Post-Op Procedure(s) (LRB): VIDEO ASSISTED THORACOSCOPY (VATS)/WEDGE RESECTION (Left)  Subjective:  Patient states he had a lot of pain overnight.  States he was not given any pain medication.  He states he needs all the pain medication he can received right now.  However, he has multiple agents ordered.  Objective: Vital signs in last 24 hours: Temp:  [97 F (36.1 C)-98.5 F (36.9 C)] 98.5 F (36.9 C) (10/08 0600) Pulse Rate:  [72-105] 86 (10/08 0600) Cardiac Rhythm:  [-] Normal sinus rhythm (10/07 1755) Resp:  [15-24] 20 (10/08 0600) BP: (117-146)/(77-96) 117/77 mmHg (10/08 0600) SpO2:  [93 %-100 %] 100 % (10/08 0600)  Intake/Output from previous day: 10/07 0701 - 10/08 0700 In: 222 [P.O.:222] Out: 1620 [Urine:1600; Chest Tube:20] Intake/Output this shift: Total I/O In: 10 [I.V.:10] Out: 10 [Chest Tube:10]  General appearance: alert, cooperative and no distress Heart: regular rate and rhythm Lungs: coarse, scattered Wound: clean and dry  Lab Results:  Recent Labs  11/03/13 0530  WBC 27.7*  HGB 13.8  HCT 41.7  PLT 331   BMET:  Recent Labs  11/03/13 0530  NA 139  K 4.7  CL 95*  CO2 29  GLUCOSE 151*  BUN 21  CREATININE 0.79  CALCIUM 9.0    PT/INR: No results found for this basename: LABPROT, INR,  in the last 72 hours ABG    Component Value Date/Time   PHART 7.453* 10/23/2013 0348   HCO3 28.4* 10/23/2013 0348   TCO2 29.7 10/23/2013 0348   O2SAT 96.4 10/23/2013 0348   CBG (last 3)   Recent Labs  11/04/13 1152 11/04/13 1912 11/05/13 0744  GLUCAP 145* 125* 95    Assessment/Plan: S/P Procedure(s) (LRB): VIDEO ASSISTED THORACOSCOPY (VATS)/WEDGE RESECTION (Left)  1. Interstitial Lung Disease 2. S/P Pigtail Catheter placement for Left Pneumothorax- no air leak appreciated this morning, CXR shows resolution of pneumothorax 3. Dispo- will leave  chest tube to suction today, care per primary   LOS: 38 days    [name], [name] 11/05/2013  patient examined and medical record reviewed,agree with above note. Try water seal  on tube tomorrow VAN TRIGT III,Zakariye Nee 11/05/2013

## 2013-11-05 NOTE — Progress Notes (Signed)
Physical Therapy Treatment Patient Details Name: Max [name] MRN: [id number] DOB: 10/09/1950 Today's Date: 11/05/2013    History of Present Illness 63 y.o. 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 Underwent VATS procedure 09/29/13.    PT Comments    Pt with chest tube insertion yesterday and having 8/10 pain today.  Gait much more limited today due to "exhaustion " per patient.  Follow Up Recommendations  SNF     Equipment Recommendations  Rolling walker with 5" wheels    Recommendations for Other Services OT consult     Precautions / Restrictions Restrictions Weight Bearing Restrictions: No    Mobility  Bed Mobility Overal bed mobility: Modified Independent                Transfers     Transfers: Sit to/from Stand Sit to Stand: Modified independent (Device/Increase time)         General transfer comment:  (stood from recliner without A)  Ambulation/Gait Ambulation/Gait assistance: Min guard Ambulation Distance (Feet): 20 Feet (10) Assistive device: None Gait Pattern/deviations: Decreased stride length Gait velocity: decreased   General Gait Details: Pt walked around the bed then took rest break with o2 at75% and HR 176.  Once vitals improved, walked 10' turned around and back to bed with o2 sats 72% and HR 147 on 6L/min.  At EOB after 3 minutes o2 91% and HR 123.     Stairs            Wheelchair Mobility    Modified Rankin (Stroke Patients Only)       Balance                                    Cognition Arousal/Alertness: Awake/alert Behavior During Therapy: WFL for tasks assessed/performed Overall Cognitive Status: Within Functional Limits for tasks assessed                      Exercises      General Comments        Pertinent Vitals/Pain Pain Assessment: 0-10 Pain Score: 8  Pain Location: chest tube insertion site Pain Descriptors / Indicators:  Sore;Aching Pain Intervention(s): Limited activity within patient's tolerance;Relaxation;Patient requesting pain meds-RN notified o2 72% HR 147 after 20' gait and increased to 91% and HR 123 at EOB within 3 mins on 6 L/min    Home Living                      Prior Function            PT Goals (current goals can now be found in the care plan section) Acute Rehab PT Goals Patient Stated Goal: Get oxygen levels better. PT Goal Formulation: With patient Time For Goal Achievement: 11/12/13 Potential to Achieve Goals: Good Progress towards PT goals: Not progressing toward goals - comment (limited by pain from chest tube insertion yesterday.)    Frequency  Min 3X/week    PT Plan Discharge plan needs to be updated    Co-evaluation             End of Session Equipment Utilized During Treatment: Other (comment);Oxygen (chest tube present) Activity Tolerance: Patient limited by pain;Patient limited by fatigue Patient left: in bed;with call bell/phone within reach     Time: 0912-0932 PT Time Calculation (min): 20 min  Charges:  $Gait Training:  8-22 mins                    G Codes:      Anastaisa Wooding LUBECK 11/05/2013, 9:48 AM

## 2013-11-05 NOTE — Plan of Care (Signed)
Problem: Phase I Progression Outcomes Goal: Initial discharge plan identified Outcome: Completed/Met Date Met:  11/05/13 PT. Recommending SNF for ST-rehab.

## 2013-11-05 NOTE — Progress Notes (Signed)
Pulmonary History: Smoking:  Started smoking cigarettes at age 63, 1 pk per 3 days.  Has smoked heavy pot "all his life".   Work:  Was in [employer], toured in [place], potential agent orange exposure ("was near the outer parameters of exposure zone").  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  9/4 - minimal response to ABX, steroids added.  9/6 - Subjective improvement with steroids, PRN BiPAP  9/8 - CXR with severe bilateral infiltrates, persistent. C/w ALI  9/12 - found to be self-medicating with sedating meds.  9/16 - able to reduce FiO2 demand to venti mask, PRN BiPAP at night, off ABX  9/21 - still very slow to improve, [facility]m [facility]. Started on high dose steroids from 9/21-9/24  9/21 ct chest > Persistent GGO with some fluctuation in distribution. No other significant change. Most c/w ARDS/ atypical PNA.  9/24 - CTS (Dr. Donata ClayVan Trigt) - Mini Anterior Thoracotomy -Lung Biopsy Left Upper Lobe x 2  10/24/13: Intermittently very agitated. Peep 8, fio2 35%, RESTART SOLUMEDROL  10/01 tolerating VM without distress. Transfer to med-surg ordered. Methylpred decreased  10.02 awake and alert with no distress on floor 10/6 try po steroids 10/7 left apical pnx worse. Pigtail catheter inserted.   Subjective:  O2 demands down  11/04/13: Pnx worse on cxr  Objective: Vital signs in last 24 hours: Blood pressure 126/82, pulse 110, temperature 97.5 F (36.4 C), temperature source Oral, resp. rate 20, height 5\' 10"  (1.778 m), weight 105.008 kg (231 lb 8 oz), SpO2 96.00%.  Intake/Output from previous day: 10/07 0701 - 10/08 0700 In: 222 [P.O.:222] Out: 1620 [Urine:1600;  Chest Tube:20]  Physical Exam: General:  Overweight male (231 lbs) in NAD Neuro:  Alert, oriented x 4, no focal deficit HEENT:  [place]/AT, PERRL, no JVD Cardiovascular:  RRR Lungs:  Equal, unlabored, clear breath sounds bilateral posterior lung fields.  Abdomen:  Soft, non-tender, non-distended Musculoskeletal:  ROM intact, no acute deformity Skin:  Intact, MMM    Lab Results:  PULMONARY No results found for this basename: PHART, PCO2, PCO2ART, PO2, PO2ART, HCO3, TCO2, O2SAT,  in the last 168 hours  CBC  Recent Labs Lab 10/31/13 0525 11/03/13 0530  HGB 12.3* 13.8  HCT 36.0* 41.7  WBC 16.3* 27.7*  PLT 323 331    COAGULATION No results found for this basename: INR,  in the last 168 hours  CARDIAC  No results found for this basename: TROPONINI,  in the last 168 hours No results found for this basename: PROBNP,  in the last 168 hours   CHEMISTRY  Recent Labs Lab 10/31/13 0525 11/03/13 0530  NA 136* 139  K 4.0 4.7  CL 96 95*  CO2 25 29  GLUCOSE 195* 151*  BUN 23 21  CREATININE 0.81 0.79  CALCIUM 8.4 9.0   Estimated Creatinine Clearance: 113.2 ml/min (by C-G formula based on Cr of 0.79).   LIVER No results found for this basename: AST, ALT, ALKPHOS, BILITOT, PROT, ALBUMIN, INR,  in the last 168 hours   INFECTIOUS No results found for this basename: LATICACIDVEN, PROCALCITON,  in the last 168 hours   ENDOCRINE CBG (last 3)   Recent Labs  11/04/13  1152 11/04/13 1912 11/05/13 0744  GLUCAP 145* 125* 95  On solumedrol 60 mg q 12 h       IMAGING x48h Dg Chest Port 1 View  11/05/2013   CLINICAL DATA:  Chest tube placement  EXAM: PORTABLE CHEST - 1 VIEW  COMPARISON:  Yesterday  FINDINGS: Left pigtail chest tube placed. Left pneumothorax resolved. Stable left upper extremity PICC. Hazy diffuse airspace opacities.  IMPRESSION: Left chest tube placed with resolution of the left pneumothorax. Stable airspace disease.   Electronically Signed   By: Maryclare Bean  M.D.   On: 11/05/2013 07:56   Dg Chest Port 1 View  11/04/2013   CLINICAL DATA:  Shortness of breath, postop VATS and left lung resection. Acute respiratory failure.  EXAM: PORTABLE CHEST - 1 VIEW  COMPARISON:  11/03/2013 and CT chest 10/19/2013.  FINDINGS: Trachea is midline. Heart is enlarged, stable. Left PICC tip projects over the SVC RA junction. Lungs are low in volume with diffuse mixed interstitial and airspace disease. Associated coarsening is better seen on prior exams. Small left apical pneumothorax, slightly increased. No definite pleural fluid.  IMPRESSION: 1. Slight increase in small left apical pneumothorax. 2. Low lung volumes with diffuse mixed interstitial and airspace disease, as before. Associated coarsening is better appreciated on prior exams and is indicative of interstitial lung disease, recently biopsied.   Electronically Signed   By: Leanna Battles M.D.   On: 11/04/2013 08:31   Ct Image Guided Drainage By Percutaneous Catheter  11/04/2013   CLINICAL DATA:  Status post left VATS with wedge lung biopsy. Persistent left apical pneumothorax postoperatively after chest tube removal. Request has been made to place a smaller caliber chest tube to treat the residual pneumothorax.  EXAM: CT GUIDED LEFT CHEST TUBE THORACOSTOMY  ANESTHESIA/SEDATION: 1.0 Mg IV Versed 50 mcg IV Fentanyl  Total Moderate Sedation Time:  20 minutes  PROCEDURE: The procedure, risks, benefits, and alternatives were explained to the patient. Questions regarding the procedure were encouraged and answered. The patient understands and consents to the procedure.  The left anterior chest wall was prepped with Betadine in a sterile fashion, and a sterile drape was applied covering the operative field. A sterile gown and sterile gloves were used for the procedure. Local anesthesia was provided with 1% Lidocaine. Prior to the procedure a timeout procedure was performed.  Imaging of the chest was performed in a supine position.  After localizing a site for anterior skin entry, an 18 gauge trocar needle was advanced into the anterior pleural space. Needle positioning was confirmed by CT. A guidewire was advanced. The tract was dilated and a 12 French pigtail drainage catheter advanced over the wire. Catheter positioning was confirmed by CT. The catheter was aspirated and connected to a [place] Pleur-Evac device.  The catheter was secured at skin with a Prolene retention suture, StatLock device and petroleum gauze dressing.  COMPLICATIONS: None  FINDINGS: CT shows residual fairly small anterior left pneumothorax. After positioning a pigtail chest tube anteriorly, suction aspiration caused nearly complete resolution of the pneumothorax. The [place] device will be connected to wall suction at -20 cm H2O of pressure.  IMPRESSION: CT-guided left chest tube thoracostomy performed for residual left pneumothorax. A 12 French pigtail catheter was advanced into the anterior left pleural space. This will be connected to wall suction at a pressure of -20 cm of water.   Electronically Signed   By: Irish Lack M.D.   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  He had mild LL ground glass infiltrates on CT A/P 08/2013, then 2 weeks later extensive ISLD bilat on CT chest.  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  On 11/02/13: he gave hx of significant GERD cotnrolled with PPI leading upto admission and findings of esophageal thickening on 09/19/13 CT abdomen. 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  Leukocytosis - secondary to steroid administration   Pain management - chronic R hip pain and acute CT pain    Plan: - CT management per TCTS - Prednisone tapered to 50mg  po daily 10/8 - Added GERD Rx - PPI + H2 blockade  On  11/02/13 -will need pulmonary follow up with Dr. Marchelle Gearing for ILD or Tammy PArrett < 7-10 days from DC  -wean O2 for sats >88% - Increase oxy/APAP to 2 tabs q 6 PRN. Fentanyl for severe breakthrough pain. Tolerable pain level is 5/10.  - May need scheduled pain medications - f/u cxr daily - monitor fever curve / leukocytosis  - Plan to DC to SNF with resolution of PTX. Had bed at [facility] prior to this setback.   Joneen Roach, ACNP [facility] Pager [phone] or [phone]    PCCM ATTENDING: I have interviewed and examined the patient and reviewed the database. I have formulated the assessment and plan as reflected in the note above with amendments made by me.   PTX improved after placement of small bore catheter. No ongoing leak evident Place on water seal. If lung stays up on CXR 10/09. Catheter can probably be removed and pt likely ready for DC to SNF  Billy Fischer, MD;  PCCM service; Mobile [phone]

## 2013-11-05 NOTE — Progress Notes (Signed)
Dr. Molli KnockYacoub notified of CBG 210. No new order at this time.

## 2013-11-05 NOTE — Progress Notes (Signed)
Referring Physician(s): CTS  Subjective: Patient states he is in pain with chest pain over tube site and shortness of breath is the same as day previous. He is upset he did not receive his pain medications overnight.   Allergies: Flexeril and Other  Medications: Prior to Admission medications   Medication Sig Start Date End Date Taking? Authorizing Provider  albuterol (PROVENTIL HFA;VENTOLIN HFA) 108 (90 BASE) MCG/ACT inhaler Inhale 2 puffs into the lungs every 4 (four) hours as needed for wheezing or shortness of breath. 09/20/13  Yes Vida Roller, MD  clonazePAM (KLONOPIN) 1 MG tablet Take 1 mg by mouth daily.   Yes Historical Provider, MD  diazepam (VALIUM) 5 MG tablet Take 1 tablet (5 mg total) by mouth every 6 (six) hours as needed for muscle spasms. 09/19/13  Yes Juliet Rude. Pickering, MD  escitalopram (LEXAPRO) 20 MG tablet Take 20 mg by mouth daily.     Yes Historical Provider, MD  gabapentin (NEURONTIN) 600 MG tablet Take 1,200 mg by mouth 3 (three) times daily.   Yes Historical Provider, MD  ibuprofen (ADVIL,MOTRIN) 800 MG tablet Take 800 mg by mouth 2 (two) times daily as needed for moderate pain.   Yes Historical Provider, MD  lisinopril-hydrochlorothiazide (PRINZIDE,ZESTORETIC) 20-12.5 MG per tablet Take 1 tablet by mouth daily.   Yes Historical Provider, MD  naproxen (NAPROSYN) 500 MG tablet Take 1 tablet (500 mg total) by mouth 2 (two) times daily with a meal. 09/20/13  Yes Vida Roller, MD  QUEtiapine (SEROQUEL) 100 MG tablet Take 100 mg by mouth at bedtime.   Yes Historical Provider, MD  zolpidem (AMBIEN) 10 MG tablet Take 10 mg by mouth at bedtime.   Yes Historical Provider, MD  levofloxacin (LEVAQUIN) 750 MG tablet Take 1 tablet (750 mg total) by mouth daily. 09/20/13   Vida Roller, MD  oxyCODONE-acetaminophen (PERCOCET) 5-325 MG per tablet Take 1 tablet by mouth every 4 (four) hours as needed. 09/20/13   Vida Roller, MD   Review of Systems  Vital Signs: BP  138/94  Pulse 115  Temp(Src) 98 F (36.7 C) (Oral)  Resp 18  Ht 5\' 10"  (1.778 m)  Wt 231 lb 8 oz (105.008 kg)  BMI 33.22 kg/m2  SpO2 94%  Physical Exam General: A&Ox3, NAD Lungs: CTA with coarse BS Left upper lobe, Left anterior pigtail chest tube catheter dressing C/D/I, no air leak  Imaging: Dg Chest 2 View  11/03/2013   CLINICAL DATA:  Shortness of breath, interstitial lung disease.  EXAM: CHEST  2 VIEW  COMPARISON:  October 31, 2013.  FINDINGS: Stable cardiomediastinal silhouette. Left-sided PICC line is unchanged in position with distal tip in expected position of the SVC. Minimal left apical pneumothorax is noted. Stable diffuse opacity is noted throughout both lungs, with left greater than right. Mild left pleural effusion is noted. Bony thorax is intact.  IMPRESSION: Stable bilateral lung opacities are noted which may represent edema or interstitial pneumonia. Minimal left apical pneumothorax is noted. These results will be called to the ordering clinician or representative by the Radiologist Assistant, and communication documented in the PACS or zVision Dashboard.   Electronically Signed   By: Roque Lias M.D.   On: 11/03/2013 08:19   Dg Chest Port 1 View  11/05/2013   CLINICAL DATA:  Chest tube placement  EXAM: PORTABLE CHEST - 1 VIEW  COMPARISON:  Yesterday  FINDINGS: Left pigtail chest tube placed. Left pneumothorax resolved. Stable left upper extremity PICC. Hazy  diffuse airspace opacities.  IMPRESSION: Left chest tube placed with resolution of the left pneumothorax. Stable airspace disease.   Electronically Signed   By: Maryclare BeanArt  Hoss M.D.   On: 11/05/2013 07:56   Dg Chest Port 1 View  11/04/2013   CLINICAL DATA:  Shortness of breath, postop VATS and left lung resection. Acute respiratory failure.  EXAM: PORTABLE CHEST - 1 VIEW  COMPARISON:  11/03/2013 and CT chest 10/19/2013.  FINDINGS: Trachea is midline. Heart is enlarged, stable. Left PICC tip projects over the SVC RA junction.  Lungs are low in volume with diffuse mixed interstitial and airspace disease. Associated coarsening is better seen on prior exams. Small left apical pneumothorax, slightly increased. No definite pleural fluid.  IMPRESSION: 1. Slight increase in small left apical pneumothorax. 2. Low lung volumes with diffuse mixed interstitial and airspace disease, as before. Associated coarsening is better appreciated on prior exams and is indicative of interstitial lung disease, recently biopsied.   Electronically Signed   By: Leanna BattlesMelinda  Blietz M.D.   On: 11/04/2013 08:31   Ct Image Guided Drainage By Percutaneous Catheter  11/04/2013   CLINICAL DATA:  Status post left VATS with wedge lung biopsy. Persistent left apical pneumothorax postoperatively after chest tube removal. Request has been made to place a smaller caliber chest tube to treat the residual pneumothorax.  EXAM: CT GUIDED LEFT CHEST TUBE THORACOSTOMY  ANESTHESIA/SEDATION: 1.0 Mg IV Versed 50 mcg IV Fentanyl  Total Moderate Sedation Time:  20 minutes  PROCEDURE: The procedure, risks, benefits, and alternatives were explained to the patient. Questions regarding the procedure were encouraged and answered. The patient understands and consents to the procedure.  The left anterior chest wall was prepped with Betadine in a sterile fashion, and a sterile drape was applied covering the operative field. A sterile gown and sterile gloves were used for the procedure. Local anesthesia was provided with 1% Lidocaine. Prior to the procedure a timeout procedure was performed.  Imaging of the chest was performed in a supine position. After localizing a site for anterior skin entry, an 18 gauge trocar needle was advanced into the anterior pleural space. Needle positioning was confirmed by CT. A guidewire was advanced. The tract was dilated and a 12 French pigtail drainage catheter advanced over the wire. Catheter positioning was confirmed by CT. The catheter was aspirated and  connected to a [place] Pleur-Evac device.  The catheter was secured at skin with a Prolene retention suture, StatLock device and petroleum gauze dressing.  COMPLICATIONS: None  FINDINGS: CT shows residual fairly small anterior left pneumothorax. After positioning a pigtail chest tube anteriorly, suction aspiration caused nearly complete resolution of the pneumothorax. The [place] device will be connected to wall suction at -20 cm H2O of pressure.  IMPRESSION: CT-guided left chest tube thoracostomy performed for residual left pneumothorax. A 12 French pigtail catheter was advanced into the anterior left pleural space. This will be connected to wall suction at a pressure of -20 cm of water.   Electronically Signed   By: Irish LackGlenn  Yamagata M.D.   On: 11/04/2013 19:22    Labs:  CBC:  Recent Labs  10/27/13 0341 10/29/13 0500 10/31/13 0525 11/03/13 0530  WBC 17.2* 15.6* 16.3* 27.7*  HGB 11.9* 12.3* 12.3* 13.8  HCT 35.9* 35.7* 36.0* 41.7  PLT 259 284 323 331    COAGS:  Recent Labs  10/20/13 1230 10/21/13 0500  INR  --  1.19  APTT 31  --     BMP:  Recent Labs  10/27/13 0341 10/29/13 0500 10/31/13 0525 11/03/13 0530  NA 141 136* 136* 139  K 4.5 3.8 4.0 4.7  CL 99 97 96 95*  CO2 30 27 25 29   GLUCOSE 129* 260* 195* 151*  BUN 28* 23 23 21   CALCIUM 8.7 8.5 8.4 9.0  CREATININE 0.80 0.75 0.81 0.79  GFRNONAA >90 >90 >90 >90  GFRAA >90 >90 >90 >90    LIVER FUNCTION TESTS:  Recent Labs  10/05/13 0439 10/15/13 0407 10/21/13 0500 10/24/13 0531  BILITOT 0.3 0.5 0.3 0.5  AST 44* 15 15 13   ALT 145* 45 42 38  ALKPHOS 122* 124* 94 71  PROT 6.4 7.5 6.4 5.7*  ALBUMIN 2.3* 3.1* 2.5* 2.5*    Assessment and Plan: 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     I spent a total of 15 minutes face to face in clinical consultation/evaluation, greater than 50% of which was counseling/coordinating care  Signed: Berneta Levins 11/05/2013, 1:35 PM

## 2013-11-06 ENCOUNTER — Inpatient Hospital Stay (HOSPITAL_COMMUNITY): Payer: Medicaid Other

## 2013-11-06 DIAGNOSIS — Z789 Other specified health status: Secondary | ICD-10-CM

## 2013-11-06 DIAGNOSIS — G894 Chronic pain syndrome: Secondary | ICD-10-CM

## 2013-11-06 LAB — GLUCOSE, CAPILLARY: Glucose-Capillary: 162 mg/dL — ABNORMAL HIGH (ref 70–99)

## 2013-11-06 MED ORDER — WHITE PETROLATUM GEL
Status: AC
  Filled 2013-11-06: qty 5

## 2013-11-06 NOTE — Progress Notes (Signed)
At 2035 while doing assessment pt had clamped his chest tube and disconnected it from the sahara drainage system. Pt placed the exposed tubing in his pocket. Pt stated that Dr. Sung AmabileSimonds stated that he could disconnect and reconnect his chest tube himself. Pt stated that he had been disconnecting his chest tube all day. Notified April rapid response nurse of situation at 2040. April rapid response nurse and nurse arrived at bedside and pt had took the tubing out of his pocket and hooked himself back up to the sahara drainage system. Rapid response RN April educated pt not to disconnect the chest tube from the drainage system due to risk of infection. Pt stated that he will not disconnect his chest tube. Pt's chest tube is to water seal. April, rapid response RN notified Dr. Kendrick FriesMcquaid of situation. Dr. Kendrick FriesMcquaid placed order for portable chest xray. Will continue to monitor pt. Nelda MarseilleJenny Thacker, RN

## 2013-11-06 NOTE — Progress Notes (Signed)
Patient ID: Max [name], male   DOB: 26-Dec-1950, 63 y.o.   MRN: [id number]   Referring Physician(s): Dr. Donata ClayVan Trigt  Subjective: pt feeling better ; ready to go home; has some mild soreness at left chest tube site    Allergies: Flexeril and Other  Medications: Prior to Admission medications   Medication Sig Start Date End Date Taking? Authorizing Provider  albuterol (PROVENTIL HFA;VENTOLIN HFA) 108 (90 BASE) MCG/ACT inhaler Inhale 2 puffs into the lungs every 4 (four) hours as needed for wheezing or shortness of breath. 09/20/13  Yes Vida RollerBrian D Miller, MD  clonazePAM (KLONOPIN) 1 MG tablet Take 1 mg by mouth daily.   Yes Historical Provider, MD  diazepam (VALIUM) 5 MG tablet Take 1 tablet (5 mg total) by mouth every 6 (six) hours as needed for muscle spasms. 09/19/13  Yes Juliet RudeNathan R. Pickering, MD  escitalopram (LEXAPRO) 20 MG tablet Take 20 mg by mouth daily.     Yes Historical Provider, MD  gabapentin (NEURONTIN) 600 MG tablet Take 1,200 mg by mouth 3 (three) times daily.   Yes Historical Provider, MD  ibuprofen (ADVIL,MOTRIN) 800 MG tablet Take 800 mg by mouth 2 (two) times daily as needed for moderate pain.   Yes Historical Provider, MD  lisinopril-hydrochlorothiazide (PRINZIDE,ZESTORETIC) 20-12.5 MG per tablet Take 1 tablet by mouth daily.   Yes Historical Provider, MD  naproxen (NAPROSYN) 500 MG tablet Take 1 tablet (500 mg total) by mouth 2 (two) times daily with a meal. 09/20/13  Yes Vida RollerBrian D Miller, MD  QUEtiapine (SEROQUEL) 100 MG tablet Take 100 mg by mouth at bedtime.   Yes Historical Provider, MD  zolpidem (AMBIEN) 10 MG tablet Take 10 mg by mouth at bedtime.   Yes Historical Provider, MD  levofloxacin (LEVAQUIN) 750 MG tablet Take 1 tablet (750 mg total) by mouth daily. 09/20/13   Vida RollerBrian D Miller, MD  oxyCODONE-acetaminophen (PERCOCET) 5-325 MG per tablet Take 1 tablet by mouth every 4 (four) hours as needed. 09/20/13   Vida RollerBrian D Miller, MD    Review of Systems see above  Vital  Signs: BP 134/86  Pulse 94  Temp(Src) 98.7 F (37.1 C) (Oral)  Resp 18  Ht 5\' 10"  (1.778 m)  Wt 231 lb 8 oz (105.008 kg)  BMI 33.22 kg/m2  SpO2 93%  Physical Exam  Pt awake/alert; left chest tube intact,on water seal; output about 60 cc's serous fluid; no air leak; CXR as below- no ptx  Imaging: Dg Chest 2 View  11/03/2013   CLINICAL DATA:  Shortness of breath, interstitial lung disease.  EXAM: CHEST  2 VIEW  COMPARISON:  October 31, 2013.  FINDINGS: Stable cardiomediastinal silhouette. Left-sided PICC line is unchanged in position with distal tip in expected position of the SVC. Minimal left apical pneumothorax is noted. Stable diffuse opacity is noted throughout both lungs, with left greater than right. Mild left pleural effusion is noted. Bony thorax is intact.  IMPRESSION: Stable bilateral lung opacities are noted which may represent edema or interstitial pneumonia. Minimal left apical pneumothorax is noted. These results will be called to the ordering clinician or representative by the Radiologist Assistant, and communication documented in the PACS or zVision Dashboard.   Electronically Signed   By: Roque LiasJames  Green M.D.   On: 11/03/2013 08:19   Dg Chest Port 1 View  11/06/2013   CLINICAL DATA:  Pneumothorax on the left  EXAM: PORTABLE CHEST - 1 VIEW  COMPARISON:  11/05/2013  FINDINGS: Left pleural pigtail catheter in  unchanged position. No left pneumothorax. Left lung interstitial and alveolar airspace opacities with a small left pleural effusion. Mild right lung interstitial thickening. Left-sided PICC line with the tip projecting over the SVC. Stable cardiomediastinal silhouette. Unremarkable osseous structures.  IMPRESSION: 1. Left-sided pigtail catheter in unchanged position without a pneumothorax. 2. Stable bilateral airspace disease, left greater than right.   Electronically Signed   By: Elige KoHetal  Patel   On: 11/06/2013 08:16   Dg Chest Port 1 View  11/05/2013   CLINICAL DATA:  Chest tube  placement  EXAM: PORTABLE CHEST - 1 VIEW  COMPARISON:  Yesterday  FINDINGS: Left pigtail chest tube placed. Left pneumothorax resolved. Stable left upper extremity PICC. Hazy diffuse airspace opacities.  IMPRESSION: Left chest tube placed with resolution of the left pneumothorax. Stable airspace disease.   Electronically Signed   By: Maryclare BeanArt  Hoss M.D.   On: 11/05/2013 07:56   Dg Chest Port 1 View  11/04/2013   CLINICAL DATA:  Shortness of breath, postop VATS and left lung resection. Acute respiratory failure.  EXAM: PORTABLE CHEST - 1 VIEW  COMPARISON:  11/03/2013 and CT chest 10/19/2013.  FINDINGS: Trachea is midline. Heart is enlarged, stable. Left PICC tip projects over the SVC RA junction. Lungs are low in volume with diffuse mixed interstitial and airspace disease. Associated coarsening is better seen on prior exams. Small left apical pneumothorax, slightly increased. No definite pleural fluid.  IMPRESSION: 1. Slight increase in small left apical pneumothorax. 2. Low lung volumes with diffuse mixed interstitial and airspace disease, as before. Associated coarsening is better appreciated on prior exams and is indicative of interstitial lung disease, recently biopsied.   Electronically Signed   By: Leanna BattlesMelinda  Blietz M.D.   On: 11/04/2013 08:31   Ct Image Guided Drainage By Percutaneous Catheter  11/04/2013   CLINICAL DATA:  Status post left VATS with wedge lung biopsy. Persistent left apical pneumothorax postoperatively after chest tube removal. Request has been made to place a smaller caliber chest tube to treat the residual pneumothorax.  EXAM: CT GUIDED LEFT CHEST TUBE THORACOSTOMY  ANESTHESIA/SEDATION: 1.0 Mg IV Versed 50 mcg IV Fentanyl  Total Moderate Sedation Time:  20 minutes  PROCEDURE: The procedure, risks, benefits, and alternatives were explained to the patient. Questions regarding the procedure were encouraged and answered. The patient understands and consents to the procedure.  The left anterior  chest wall was prepped with Betadine in a sterile fashion, and a sterile drape was applied covering the operative field. A sterile gown and sterile gloves were used for the procedure. Local anesthesia was provided with 1% Lidocaine. Prior to the procedure a timeout procedure was performed.  Imaging of the chest was performed in a supine position. After localizing a site for anterior skin entry, an 18 gauge trocar needle was advanced into the anterior pleural space. Needle positioning was confirmed by CT. A guidewire was advanced. The tract was dilated and a 12 French pigtail drainage catheter advanced over the wire. Catheter positioning was confirmed by CT. The catheter was aspirated and connected to a [place] Pleur-Evac device.  The catheter was secured at skin with a Prolene retention suture, StatLock device and petroleum gauze dressing.  COMPLICATIONS: None  FINDINGS: CT shows residual fairly small anterior left pneumothorax. After positioning a pigtail chest tube anteriorly, suction aspiration caused nearly complete resolution of the pneumothorax. The [place] device will be connected to wall suction at -20 cm H2O of pressure.  IMPRESSION: CT-guided left chest tube thoracostomy performed for residual  left pneumothorax. A 12 French pigtail catheter was advanced into the anterior left pleural space. This will be connected to wall suction at a pressure of -20 cm of water.   Electronically Signed   By: Irish Lack M.D.   On: 11/04/2013 19:22    Labs:  CBC:  Recent Labs  10/27/13 0341 10/29/13 0500 10/31/13 0525 11/03/13 0530  WBC 17.2* 15.6* 16.3* 27.7*  HGB 11.9* 12.3* 12.3* 13.8  HCT 35.9* 35.7* 36.0* 41.7  PLT 259 284 323 331    COAGS:  Recent Labs  10/20/13 1230 10/21/13 0500  INR  --  1.19  APTT 31  --     BMP:  Recent Labs  10/27/13 0341 10/29/13 0500 10/31/13 0525 11/03/13 0530  NA 141 136* 136* 139  K 4.5 3.8 4.0 4.7  CL 99 97 96 95*  CO2 30 27 25 29   GLUCOSE 129*  260* 195* 151*  BUN 28* 23 23 21   CALCIUM 8.7 8.5 8.4 9.0  CREATININE 0.80 0.75 0.81 0.79  GFRNONAA >90 >90 >90 >90  GFRAA >90 >90 >90 >90    LIVER FUNCTION TESTS:  Recent Labs  10/05/13 0439 10/15/13 0407 10/21/13 0500 10/24/13 0531  BILITOT 0.3 0.5 0.3 0.5  AST 44* 15 15 13   ALT 145* 45 42 38  ALKPHOS 122* 124* 94 71  PROT 6.4 7.5 6.4 5.7*  ALBUMIN 2.3* 3.1* 2.5* 2.5*    Assessment and Plan: S/p left VATS with wedge lung bx 9/1, persistent apical ptx with left chest tube placement 10/7; CXR today - no ptx; plans as outlined by TCTS- check CXR in am and if no ptx plan tube removal     I spent a total of 15  minutes face to face in clinical consultation/evaluation, greater than 50% of which was counseling/coordinating care for left chest tube.  Signed: Chinita Pester 11/06/2013, 12:45 PM

## 2013-11-06 NOTE — Progress Notes (Addendum)
      [address]       [place] [id number]             [phone]      15 Days Post-Op Procedure(s) (LRB): VIDEO ASSISTED THORACOSCOPY (VATS)/WEDGE RESECTION (Left)  Subjective:  Patient feeling better this morning.  States his pain is under better control.  Objective: Vital signs in last 24 hours: Temp:  [97.5 F (36.4 C)-98.5 F (36.9 C)] 97.8 F (36.6 C) (10/09 0545) Pulse Rate:  [91-115] 94 (10/09 0545) Cardiac Rhythm:  [-]  Resp:  [16-19] 16 (10/09 0545) BP: (114-138)/(78-94) 114/79 mmHg (10/09 0545) SpO2:  [94 %-99 %] 99 % (10/09 0545)  Intake/Output from previous day: 10/08 0701 - 10/09 0700 In: 2144 [P.O.:2124; I.V.:20] Out: 2185 [Urine:2125; Chest Tube:60]  General appearance: alert, cooperative and no distress Heart: regular rate and rhythm Lungs: clear to auscultation bilaterally Abdomen: soft, non-tender; bowel sounds normal; no masses,  no organomegaly Wound: clean and dry   Lab Results: No results found for this basename: WBC, HGB, HCT, PLT,  in the last 72 hours BMET: No results found for this basename: NA, K, CL, CO2, GLUCOSE, BUN, CREATININE, CALCIUM,  in the last 72 hours  PT/INR: No results found for this basename: LABPROT, INR,  in the last 72 hours ABG    Component Value Date/Time   PHART 7.453* 10/23/2013 0348   HCO3 28.4* 10/23/2013 0348   TCO2 29.7 10/23/2013 0348   O2SAT 96.4 10/23/2013 0348   CBG (last 3)   Recent Labs  11/05/13 0744 11/05/13 1200 11/05/13 1719  GLUCAP 95 175* 210*    Assessment/Plan: S/P Procedure(s) (LRB): VIDEO ASSISTED THORACOSCOPY (VATS)/WEDGE RESECTION (Left)  1. Chest tube- no air leak present, minimal output- CXR free from pneumothorax- will place chest tube on water seal 2. Dispo- care per primary, repeat CXR in AM, if no pneumothorax can likley d/c chest tube tomorrow   LOS: 39 days    Max [name] 11/06/2013  Agree with above plan - CXR in am then DC pigtail cath if no  change patient examined and medical record reviewed,agree with above note. Max TRIGT III,[name] 11/06/2013

## 2013-11-06 NOTE — Clinical Social Work Note (Signed)
CSW visited with patient. Facility offering bed for patient states that they can admit the patient over the weekend if he is ready for DC. CSW will leave report for weekend CSW.    Roddie McBryant Ambika Zettlemoyer MSW, JalLCSWA, [facility], [phone]

## 2013-11-06 NOTE — Progress Notes (Signed)
Physical Therapy Treatment Patient Details Name: Max [name] MRN: [id number] DOB: 1950/06/28 Today's Date: 11/06/2013    History of Present Illness 63 y.o. 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 Underwent VATS procedure 09/29/13.    PT Comments    Patient making excellent gains with mobility and gait.  Continues to have issues with O2 sat.  Will need to use monitor during ambulation.  Follow Up Recommendations  SNF     Equipment Recommendations  Rolling walker with 5" wheels    Recommendations for Other Services       Precautions / Restrictions Precautions Precautions: None Precaution Comments: chest tube placed 10/7 Restrictions Weight Bearing Restrictions: No    Mobility  Bed Mobility                  Transfers Overall transfer level: Modified independent Equipment used: Rolling walker (2 wheeled) Transfers: Sit to/from Stand Sit to Stand: Modified independent (Device/Increase time)         General transfer comment: Cues to move at safe pace.  Ambulation/Gait Ambulation/Gait assistance: Min guard Ambulation Distance (Feet): 220 Feet Assistive device: Rolling walker (2 wheeled) Gait Pattern/deviations: Step-through pattern;Decreased stride length Gait velocity: decreased Gait velocity interpretation: Below normal speed for age/gender General Gait Details: Verbal cues to move at slower pace and minimize talking for energy conservation.  Instructed on pursed-lip breathing.  Patient with O2 sats at 70% following ambulation on 6 liters O2.  Recovered to 92% within 2 minutes.  Patient with minimal dyspnea with gait.  Good balance and safe use of RW.   Stairs            Wheelchair Mobility    Modified Rankin (Stroke Patients Only)       Balance                                    Cognition Arousal/Alertness: Awake/alert Behavior During Therapy: WFL for tasks assessed/performed Overall  Cognitive Status: Within Functional Limits for tasks assessed                      Exercises      General Comments        Pertinent Vitals/Pain Pain Assessment: 0-10 Pain Score: 3  Pain Location: chest tube insertion site Pain Descriptors / Indicators: Sore Pain Intervention(s): Monitored during session    Home Living                      Prior Function            PT Goals (current goals can now be found in the care plan section) Progress towards PT goals: Progressing toward goals    Frequency  Min 3X/week    PT Plan Current plan remains appropriate    Co-evaluation             End of Session Equipment Utilized During Treatment: Oxygen Activity Tolerance: Patient tolerated treatment well;Treatment limited secondary to medical complications (Comment) (Decreased O2 sats) Patient left: in bed;with call bell/phone within reach (sitting EOB)     Time: [id number] PT Time Calculation (min): 24 min  Charges:  $Gait Training: 23-37 mins                    G Codes:      Max [name] 11/06/2013, 4:55 PM Max [name] Pager [phone]

## 2013-11-06 NOTE — Progress Notes (Signed)
eLink Physician-Brief Progress Note Patient Name: Max SereneJerry D Brinton DOB: 04-Feb-1950 MRN: [id number]   Date of Service  11/06/2013  HPI/Events of Note  Nursing reports that the patient has unhooked himself from the pleur-evac chamber (not unhooked suction from the chamber, he has actually disconnected the pigtail chest tube from the pleur-evac) Currently stable  eICU Interventions  CXR stat Nursing (RRT nurse) has educated nurse to leave the chest tube alone     Intervention Category Minor Interventions: Routine modifications to care plan (e.g. PRN medications for pain, fever);Communication with other healthcare providers and/or family  Max FickleMCQUAID, DOUGLAS 11/06/2013, 9:31 PM

## 2013-11-06 NOTE — Progress Notes (Signed)
Pulmonary History: Smoking:  Started smoking cigarettes at age 63, 1 pk per 3 days.  Has smoked heavy pot "all his life".   Work:  Was in [employer], toured in [place], potential agent orange exposure ("was near the outer parameters of exposure zone").  Worked Holiday representative, asbestos exposures.   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  9/04  minimal response to ABX, steroids added.  9/06  Subjective improvement with steroids, PRN BiPAP  9/08  CXR with severe bilateral infiltrates, persistent. C/w ALI  9/12  found to be self-medicating with sedating meds.  9/16  able to reduce FiO2 demand to venti mask, PRN BiPAP at night, off ABX  9/21  still very slow to improve, transferred to [facility] for additional pulm eval. Started on high dose steroids from 9/21-9/24  9/21  ct chest > Persistent GGO with some fluctuation in distribution. No other significant change. Most c/w ARDS/ atypical PNA.  9/24  CTS (Dr. Donata Clay) - Mini Anterior Thoracotomy -Lung Biopsy Left Upper Lobe x 2  9/26  Intermittently very agitated. Peep 8, fio2 35%, RESTART SOLUMEDROL  10/1  tolerating VM without distress. Transfer to med-surg ordered. Methylpred decreased  10/2  awake and alert with no distress on floor 10/6  try po steroids 10/7  left apical pnx worse. Pigtail catheter inserted.  10/9  CT to water seal overnight, lung up on cxr.    Subjective:  VSS, afebrile.  6L O2.  Pt denies needs / complaints. Hopeful to go home soon.   Objective: Vital signs in last 24 hours: Blood pressure 114/79, pulse 94, temperature 97.8 F (36.6 C), temperature source Oral, resp. rate 16, height 5\' 10"  (1.778 m), weight 231 lb 8 oz (105.008 kg), SpO2 99.00%.  Intake/Output from  previous day: 10/08 0701 - 10/09 0700 In: 2144 [P.O.:2124; I.V.:20] Out: 2185 [Urine:2125; Chest Tube:60]  Physical Exam: General:  Overweight male in NAD Neuro:  Alert, oriented x 4, no focal deficit HEENT:  [place]/AT, PERRL, no JVD Cardiovascular:  RRR Lungs:  resp's even/non-labored, lungs bilaterally diminished but clear Abdomen:  Soft, non-tender, non-distended Musculoskeletal:  ROM intact, no acute deformity Skin:  Intact, MMM    Lab Results:  CBC  Recent Labs Lab 10/31/13 0525 11/03/13 0530  HGB 12.3* 13.8  HCT 36.0* 41.7  WBC 16.3* 27.7*  PLT 323 331   CHEMISTRY  Recent Labs Lab 10/31/13 0525 11/03/13 0530  NA 136* 139  K 4.0 4.7  CL 96 95*  CO2 25 29  GLUCOSE 195* 151*  BUN 23 21  CREATININE 0.81 0.79  CALCIUM 8.4 9.0   Estimated Creatinine Clearance: 113.2 ml/min (by C-G formula based on Cr of 0.79).  ENDOCRINE CBG (last 3)   Recent Labs  11/05/13 0744 11/05/13 1200 11/05/13 1719  GLUCAP 95 175* 210*  On solumedrol 60 mg q 12 h   IMAGING x48h Dg Chest Port 1 View  11/06/2013   CLINICAL DATA:  Pneumothorax on the left  EXAM: PORTABLE CHEST - 1 VIEW  COMPARISON:  11/05/2013  FINDINGS: Left pleural pigtail catheter in unchanged position. No left pneumothorax. Left lung interstitial and alveolar airspace opacities with a small left pleural effusion. Mild right lung interstitial thickening. Left-sided PICC line with the  tip projecting over the SVC. Stable cardiomediastinal silhouette. Unremarkable osseous structures.  IMPRESSION: 1. Left-sided pigtail catheter in unchanged position without a pneumothorax. 2. Stable bilateral airspace disease, left greater than right.   Electronically Signed   By: Elige KoHetal  Patel   On: 11/06/2013 08:16   Dg Chest Port 1 View  11/05/2013   CLINICAL DATA:  Chest tube placement  EXAM: PORTABLE CHEST - 1 VIEW  COMPARISON:  Yesterday  FINDINGS: Left pigtail chest tube placed. Left pneumothorax resolved. Stable left upper  extremity PICC. Hazy diffuse airspace opacities.  IMPRESSION: Left chest tube placed with resolution of the left pneumothorax. Stable airspace disease.   Electronically Signed   By: Maryclare BeanArt  Hoss M.D.   On: 11/05/2013 07:56   Ct Image Guided Drainage By Percutaneous Catheter  11/04/2013   CLINICAL DATA:  Status post left VATS with wedge lung biopsy. Persistent left apical pneumothorax postoperatively after chest tube removal. Request has been made to place a smaller caliber chest tube to treat the residual pneumothorax.  EXAM: CT GUIDED LEFT CHEST TUBE THORACOSTOMY  ANESTHESIA/SEDATION: 1.0 Mg IV Versed 50 mcg IV Fentanyl  Total Moderate Sedation Time:  20 minutes  PROCEDURE: The procedure, risks, benefits, and alternatives were explained to the patient. Questions regarding the procedure were encouraged and answered. The patient understands and consents to the procedure.  The left anterior chest wall was prepped with Betadine in a sterile fashion, and a sterile drape was applied covering the operative field. A sterile gown and sterile gloves were used for the procedure. Local anesthesia was provided with 1% Lidocaine. Prior to the procedure a timeout procedure was performed.  Imaging of the chest was performed in a supine position. After localizing a site for anterior skin entry, an 18 gauge trocar needle was advanced into the anterior pleural space. Needle positioning was confirmed by CT. A guidewire was advanced. The tract was dilated and a 12 French pigtail drainage catheter advanced over the wire. Catheter positioning was confirmed by CT. The catheter was aspirated and connected to a [place] Pleur-Evac device.  The catheter was secured at skin with a Prolene retention suture, StatLock device and petroleum gauze dressing.  COMPLICATIONS: None  FINDINGS: CT shows residual fairly small anterior left pneumothorax. After positioning a pigtail chest tube anteriorly, suction aspiration caused nearly complete resolution  of the pneumothorax. The [place] device will be connected to wall suction at -20 cm H2O of pressure.  IMPRESSION: CT-guided left chest tube thoracostomy performed for residual left pneumothorax. A 12 French pigtail catheter was advanced into the anterior left pleural space. This will be connected to wall suction at a pressure of -20 cm of water.   Electronically Signed   By: Irish LackGlenn  Yamagata M.D.   On: 11/04/2013 19:22      Assessment/Plan:  Acute respiratory failure secondary to ISLD of unknown origin.  He had mild LL ground glass infiltrates on CT A/P 08/2013, then 2 weeks later extensive ISLD bilat on CT chest.  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  On 11/02/13: he gave hx of significant GERD cotnrolled with PPI leading upto admission and findings of esophageal thickening on 09/19/13 CT abdomen. 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  CT to water seal 10/8, lung up 10/9  Leukocytosis - secondary to steroid administration   Pain management - chronic R hip  pain and acute CT pain    Plan: - CT management per TCTS, discussed removal with Dr. Donata ClayVan Trigt and will keep to water seal one more day - Prednisone tapered to 50 mg po daily 10/8 - GERD Rx - PPI + H2 blockade   - will need pulmonary follow up with Dr. Marchelle Gearingamaswamy for ILD or Tammy Parrett 7-10 days from DC  - wean O2 for sats >88% - Increase oxy/APAP to 2 tabs q 6 PRN. Fentanyl for severe breakthrough pain. Tolerable pain level is 5/10 - f/u cxr daily - monitor fever curve / leukocytosis  - Plan to DC to SNF with resolution of PTX. [facility].   -Followup   Future Appointments Date Time Provider Department Center  11/16/2013 2:30 PM Julio Sicksammy S Parrett, NP [facility] None  11/18/2013 4:00 PM Kerin PernaPeter Van Trigt, MD [facility]     Canary BrimBrandi Ollis,  NP-C [place] [facility] Pgr: [phone] or [phone]   sTAFF Md  - personally evaluated patient. Says though on [facility] he is recovering better; distance to desats is more and recovery time is faster and he is more energetic. Needing percocet for back pain and fentanylf or chest tube pain. Chest tube can come come out tomorrow if all well.    Dr. Kalman ShanMurali Aerith Canal, M.D., [facility].C.P Pulmonary and Critical Care Medicine Staff Physician  System [place] [facility] Pager: [phone], If no answer or between  15:00h - 7:00h: call [phone]  11/06/2013 12:15 PM

## 2013-11-07 ENCOUNTER — Inpatient Hospital Stay (HOSPITAL_COMMUNITY): Payer: Medicaid Other

## 2013-11-07 LAB — GLUCOSE, CAPILLARY
Glucose-Capillary: 121 mg/dL — ABNORMAL HIGH (ref 70–99)
Glucose-Capillary: 150 mg/dL — ABNORMAL HIGH (ref 70–99)
Glucose-Capillary: 197 mg/dL — ABNORMAL HIGH (ref 70–99)
Glucose-Capillary: 218 mg/dL — ABNORMAL HIGH (ref 70–99)

## 2013-11-07 LAB — BASIC METABOLIC PANEL
Anion gap: 14 (ref 5–15)
BUN: 16 mg/dL (ref 6–23)
CO2: 31 mEq/L (ref 19–32)
Calcium: 8.8 mg/dL (ref 8.4–10.5)
Chloride: 95 mEq/L — ABNORMAL LOW (ref 96–112)
Creatinine, Ser: 0.86 mg/dL (ref 0.50–1.35)
GFR calc Af Amer: >90 mL/min (ref >90)
GFR calc non Af Amer: 90 mL/min — ABNORMAL LOW (ref >90)
Glucose, Bld: 105 mg/dL — ABNORMAL HIGH (ref 70–99)
Potassium: 4.3 mEq/L (ref 3.7–5.3)
Sodium: 140 mEq/L (ref 137–147)

## 2013-11-07 LAB — CBC
HCT: 39 % (ref 39.0–52.0)
Hemoglobin: 12.9 g/dL — ABNORMAL LOW (ref 13.0–17.0)
MCH: 31.5 pg (ref 26.0–34.0)
MCHC: 33.1 g/dL (ref 30.0–36.0)
MCV: 95.1 fL (ref 78.0–100.0)
Platelets: 188 10*3/uL (ref 150–400)
RBC: 4.1 MIL/uL — ABNORMAL LOW (ref 4.22–5.81)
RDW: 14.7 % (ref 11.5–15.5)
WBC: 21.3 10*3/uL — ABNORMAL HIGH (ref 4.0–10.5)

## 2013-11-07 NOTE — Progress Notes (Signed)
Pulmonary History: Smoking:  Started smoking cigarettes at age 63, 1 pk per 3 days.  Has smoked heavy pot "all his life".   Work:  Was in [employer], toured in [place], potential agent orange exposure ("was near the outer parameters of exposure zone").  Worked Holiday representative, asbestos exposures.   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  9/04  minimal response to ABX, steroids added.  9/06  Subjective improvement with steroids, PRN BiPAP  9/08  CXR with severe bilateral infiltrates, persistent. C/w ALI  9/12  found to be self-medicating with sedating meds.  9/16  able to reduce FiO2 demand to venti mask, PRN BiPAP at night, off ABX  9/21  still very slow to improve, transferred to [facility] for additional pulm eval. Started on high dose steroids from 9/21-9/24  9/21  ct chest > Persistent GGO with some fluctuation in distribution. No other significant change. Most c/w ARDS/ atypical PNA.  9/24  CTS (Dr. Donata Clay) - Mini Anterior Thoracotomy -Lung Biopsy Left Upper Lobe x 2  9/26  Intermittently very agitated. Peep 8, fio2 35%, RESTART SOLUMEDROL  10/1  tolerating VM without distress. Transfer to med-surg ordered. Methylpred decreased  10/2  awake and alert with no distress on floor 10/6  try po steroids 10/7  left apical pnx worse. Pigtail catheter inserted.  10/9  CT to water seal overnight, lung up on cxr.   10/10 CT removed > no ptx   Subjective:   Sitting in chair/   Pt denies needs / complaints. Hopeful to go home soon.  Still on 6lpm   Objective: Vital signs in last 24 hours: Blood pressure 124/86, pulse 94, temperature 98.2 F (36.8 C), temperature source Oral, resp. rate 18, height 5\' 10"  (1.778 m), weight 231 lb 8 oz (105.008  kg), SpO2 98.00%.  Intake/Output from previous day: 10/09 0701 - 10/10 0700 In: 840 [P.O.:840] Out: 3050 [Urine:3050]  Physical Exam: General:  Overweight male in NAD Neuro:  Alert, oriented x 4, no focal deficit HEENT:  [place]/AT, PERRL, no JVD Cardiovascular:  RRR Lungs:  resp's even/non-labored, lungs bilaterally diminished but clear, only a few crackles in bases bilaterally  Abdomen:  Soft, non-tender, non-distended Musculoskeletal:  ROM intact, no acute deformity Skin:  Intact, MMM    Lab Results:  CBC  Recent Labs Lab 11/03/13 0530 11/07/13 0451  HGB 13.8 12.9*  HCT 41.7 39.0  WBC 27.7* 21.3*  PLT 331 188   CHEMISTRY  Recent Labs Lab 11/03/13 0530 11/07/13 0451  NA 139 140  K 4.7 4.3  CL 95* 95*  CO2 29 31  GLUCOSE 151* 105*  BUN 21 16  CREATININE 0.79 0.86  CALCIUM 9.0 8.8   Estimated Creatinine Clearance: 105.3 ml/min (by C-G formula based on Cr of 0.86).  ENDOCRINE CBG (last 3)   Recent Labs  11/06/13 2139 11/07/13 0821 11/07/13 1221  GLUCAP 162* 121* 150*  On solumedrol 60 mg q 12 h   IMAGING x48h Dg Chest 2 View  11/07/2013   CLINICAL DATA:  Persistent difficulty breathing  EXAM: CHEST  2 VIEW  COMPARISON:  November 06, 2013  FINDINGS: Pigtail catheter remains on the left unchanged. Central catheter tip is at the cavoatrial junction. No pneumothorax. There  is persistent pulmonary fibrosis. There is no airspace consolidation or new opacity. Heart is prominent but stable. Pulmonary vascularity is within normal limits. No adenopathy. There is postoperative change in the left shoulder.  IMPRESSION: Persistent pulmonary fibrosis without new opacity. No consolidation. Stable cardiac prominence. Tube and catheter positions as described without demonstrable pneumothorax.   Electronically Signed   By: Bretta BangWilliam  Woodruff M.D.   On: 11/07/2013 08:42   Dg Chest Port 1 View  11/07/2013   CLINICAL DATA:  Initial encounter for chest tube removal.  EXAM:  PORTABLE CHEST - 1 VIEW  COMPARISON:  Two-view chest from the same day.  FINDINGS: The heart size is normal. The left-sided chest tube has been removed. In tiny apical pneumothorax is suspected. Asymmetric interstitial and airspace disease is similar to the prior study, worse on the left. A left-sided PICC line is stable in position.  IMPRESSION: 1. Interval removal of left-sided chest tube. A small apical pneumothorax is evident. 2. Left greater right diffuse interstitial and airspace disease. This likely represents edema superimposed on chronic interstitial disease.   Electronically Signed   By: Gennette Pachris  Mattern M.D.   On: 11/07/2013 10:57   Dg Chest Port 1 View  11/06/2013   CLINICAL DATA:  Follow-up left pneumothorax with small bore chest tube in place.  EXAM: PORTABLE CHEST - 1 VIEW  COMPARISON:  Portable chest x-rays earlier same date 0702 hr and dating back to 11/04/2013.  FINDINGS: Pigtail catheter in the left pleural space without evidence of residual pneumothorax. Left arm PICC tip projects over the lower SVC. Diffuse interstitial pulmonary fibrosis associated with low lung volumes. Increasing focal opacity at the right lung base. No new pulmonary parenchymal abnormalities elsewhere.  IMPRESSION: 1. No evidence of residual or recurrent left pneumothorax. 2. Support apparatus satisfactory. 3. Query developing pneumonia at the right lung base superimposed upon chronic interstitial fibrosis.   Electronically Signed   By: Hulan Saashomas  Lawrence M.D.   On: 11/06/2013 22:07   Dg Chest Port 1 View  11/06/2013   CLINICAL DATA:  Pneumothorax on the left  EXAM: PORTABLE CHEST - 1 VIEW  COMPARISON:  11/05/2013  FINDINGS: Left pleural pigtail catheter in unchanged position. No left pneumothorax. Left lung interstitial and alveolar airspace opacities with a small left pleural effusion. Mild right lung interstitial thickening. Left-sided PICC line with the tip projecting over the SVC. Stable cardiomediastinal silhouette.  Unremarkable osseous structures.  IMPRESSION: 1. Left-sided pigtail catheter in unchanged position without a pneumothorax. 2. Stable bilateral airspace disease, left greater than right.   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  He had mild LL ground glass infiltrates on CT A/P 08/2013, then 2 weeks later extensive ISLD bilat on CT chest.  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  On 11/02/13: he gave hx of significant GERD controlled with PPI leading up to admission and findings of esophageal thickening on 09/19/13 CT abdomen. 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  CT to water seal 10/8, lung up 10/9> dc/d 10/10  Leukocytosis - secondary to steroid administration   Pain management - chronic R hip pain and acute CT pain    Plan:   - Prednisone tapered to 50 mg po daily 10/8 -  GERD Rx - PPI + H2 blockade   - will need pulmonary follow up with Dr. Marchelle Gearingamaswamy for ILD or Tammy Parrett 7-10 days from DC  - wean O2 for sats >88% - Increase oxy/APAP to 2 tabs q 6 PRN. Fentanyl for severe breakthrough pain. Tolerable pain level is 5/10 - f/u cxr am 10/11 and discharge  - Plan to DC to SNF with resolution of PTX. [facility].   -Followup   Future Appointments Date Time Provider Department Center  11/16/2013 2:30 PM Julio Sicksammy S Parrett, NP [facility] None  11/18/2013 4:00 PM Kerin PernaPeter Van Trigt, MD [facility]           Sandrea HughsMichael Ashur Glatfelter, MD Pulmonary and Critical Care Medicine [facility] Cell [phone] After 5:30 PM or weekends, call [phone]

## 2013-11-07 NOTE — Progress Notes (Signed)
Called by primary RN to see pt with concerns of pt having disconnected his chest tube.  On arrival to the room the pt had [facility] and had the tube in his pocket.  The pt reconnected himself to the drainage chamber.  I explained to the pt the risks involved with his behavior such as infection, and he stated the doctor told him he could disconnect himself and walk around.  Dr. Kendrick FriesMcQuaid updated & PCXR obtained.  Will cont to monitor.

## 2013-11-07 NOTE — Progress Notes (Signed)
Pt feels well. Tolerated chest tube to H20 seal without resp complaint.  CXR this am stable, no recurrent PTX Chest tube removed at bedside without complications. Occlusive dressing applied, needs to remain x 24hr  Follow up CXR shows possible minimal to no apical PTX-reviewed with Dr. Fredia SorrowYamagata.  Brayton ElKevin Madine Sarr PA-C Interventional Radiology 11/07/2013 9:33 AM

## 2013-11-07 NOTE — Progress Notes (Signed)
Went into patients room and he was out of breath. Pt stated he had been doing  partial push ups. Stated he had been leaning against the bed doing them that way. Stated that he is trying to get his stats up. I reminded him that he had just had his chest tube removed this morning and that he is still under doctors care here in the hospital and that he really should not be doing push ups. I also told him that doing push ups would put him at a great risk for falling. Stated that he understood and would not do push ups again or anything to over exert himself. Patient then ambulated 50 feet with the charge nurse per his request. Sats were in the 60's and 70's during ambulation with O2 on at 6 liters via [place].

## 2013-11-07 NOTE — Progress Notes (Signed)
Chest tube removed today.  CXR reviewed. I don't think there is a PTX at left apex.

## 2013-11-07 NOTE — Progress Notes (Signed)
      [address]       [place] [id number]             [phone]       16 Days Post-Op Procedure(s) (LRB): VIDEO ASSISTED THORACOSCOPY (VATS)/WEDGE RESECTION (Left)  Subjective: Patient   Objective: Vital signs in last 24 hours: Temp:  [98.1 F (36.7 C)-98.7 F (37.1 C)] 98.2 F (36.8 C) (10/10 0609) Pulse Rate:  [94-110] 94 (10/10 0609) Cardiac Rhythm:  [-]  Resp:  [18] 18 (10/10 0609) BP: (123-134)/(86-93) 124/86 mmHg (10/10 0609) SpO2:  [70 %-98 %] 98 % (10/10 0609)     Intake/Output from previous day: 10/09 0701 - 10/10 0700 In: 840 [P.O.:840] Out: 3050 [Urine:3050]   Physical Exam:  Cardiovascular: RRR Pulmonary: Some coarse breath sounds Abdomen: Soft, non tender, bowel sounds present. Wounds: Left chest tube site islean and dry.  No erythema or signs of infection. Edge of anterior left chest wound with slight skin separation and mostly dry.    Lab Results: CBC:  Recent Labs  11/07/13 0451  WBC 21.3*  HGB 12.9*  HCT 39.0  PLT 188   BMET:   Recent Labs  11/07/13 0451  NA 140  K 4.3  CL 95*  CO2 31  GLUCOSE 105*  BUN 16  CREATININE 0.86  CALCIUM 8.8    PT/INR: No results found for this basename: LABPROT, INR,  in the last 72 hours ABG:  INR: Will add last result for INR, ABG once components are confirmed Will add last 4 CBG results once components are confirmed  Assessment/Plan:  1.  Pulmonary - acute respiratory failure secondary to ISLD. On Prednisone.  On 6 liters of oxygen. CXR today shows no pneumothorax and persistent pulmonary fibrosis. There is no air leak. Remove chest tube today 2.Regarding anterior chest wound, if further drainage, place dry 4x4 with tape. Change daily. Do not put betadine or neosporin on wound.Once dry, let open to the air 3. Management per medicine and pulmonary  Janasia Coverdale MPA-C 11/07/2013,8:33 AM

## 2013-11-07 NOTE — Clinical Social Work Note (Signed)
CSW continues to follow patient for d/c planning needs. CSW made aware patient not ready for d/c. CSW contacted [facility] and made Tammy Blakley aware. CSW to continue to follow.  Max [name], LCSWA Weekend Clinical Social Worker [phone]

## 2013-11-08 ENCOUNTER — Inpatient Hospital Stay (HOSPITAL_COMMUNITY): Payer: Medicaid Other

## 2013-11-08 LAB — GLUCOSE, CAPILLARY
Glucose-Capillary: 103 mg/dL — ABNORMAL HIGH (ref 70–99)
Glucose-Capillary: 229 mg/dL — ABNORMAL HIGH (ref 70–99)

## 2013-11-08 MED ORDER — GABAPENTIN 400 MG PO CAPS: 400.0000 mg | ORAL_CAPSULE | Freq: Three times a day (TID) | ORAL | Status: DC

## 2013-11-08 MED ORDER — CLONAZEPAM 1 MG PO TABS: 1.0000 mg | ORAL_TABLET | Freq: Two times a day (BID) | ORAL | Status: DC

## 2013-11-08 MED ORDER — POLYETHYLENE GLYCOL 3350 17 G PO PACK: 17.0000 g | PACK | Freq: Three times a day (TID) | ORAL | Status: DC

## 2013-11-08 MED ORDER — ZOLPIDEM TARTRATE 5 MG PO TABS: 10.0000 mg | ORAL_TABLET | Freq: Every evening | ORAL | Status: DC | PRN

## 2013-11-08 MED ORDER — NYSTATIN 100000 UNIT/GM EX CREA: TOPICAL_CREAM | Freq: Two times a day (BID) | CUTANEOUS | Status: DC

## 2013-11-08 MED ORDER — ALBUTEROL SULFATE (2.5 MG/3ML) 0.083% IN NEBU: 2.5000 mg | INHALATION_SOLUTION | RESPIRATORY_TRACT | Status: DC | PRN

## 2013-11-08 MED ORDER — FAMOTIDINE 20 MG PO TABS: 20.0000 mg | ORAL_TABLET | Freq: Every day | ORAL | Status: DC

## 2013-11-08 MED ORDER — BISACODYL 10 MG RE SUPP: 10.0000 mg | Freq: Every day | RECTAL | Status: DC | PRN

## 2013-11-08 MED ORDER — OXYCODONE-ACETAMINOPHEN 5-325 MG PO TABS: 1.0000 | ORAL_TABLET | Freq: Four times a day (QID) | ORAL | Status: DC | PRN

## 2013-11-08 MED ORDER — DOCUSATE SODIUM 100 MG PO CAPS: 100.0000 mg | ORAL_CAPSULE | Freq: Two times a day (BID) | ORAL | Status: DC

## 2013-11-08 MED ORDER — ZOLPIDEM TARTRATE 10 MG PO TABS: 10.0000 mg | ORAL_TABLET | Freq: Every evening | ORAL | Status: AC | PRN

## 2013-11-08 MED ORDER — PANTOPRAZOLE SODIUM 40 MG PO TBEC: 40.0000 mg | DELAYED_RELEASE_TABLET | Freq: Every day | ORAL | Status: AC

## 2013-11-08 MED ORDER — AMITRIPTYLINE HCL 50 MG PO TABS: 50.0000 mg | ORAL_TABLET | Freq: Every evening | ORAL | Status: DC | PRN

## 2013-11-08 MED ORDER — PREDNISONE 20 MG PO TABS: 40.0000 mg | ORAL_TABLET | Freq: Every day | ORAL | Status: DC

## 2013-11-08 MED ORDER — SUCRALFATE 1 G PO TABS: 1.0000 g | ORAL_TABLET | Freq: Three times a day (TID) | ORAL | Status: DC

## 2013-11-08 MED ORDER — PREDNISONE 20 MG PO TABS
40.0000 mg | ORAL_TABLET | Freq: Every day | ORAL | Status: DC
  Filled 2013-11-08: qty 2

## 2013-11-08 NOTE — Clinical Social Work Note (Signed)
CSW made aware patient ready for d/c to [facility]. CSW contacted Tammy of [facility] who confirmed bed availability. CSW met with patient who was very pleasant and agreeable to d/c plan. Per patient, he has already made his fiance and sister aware of d/c. CSW prepared d/c packet and placed in patient's shadow chart, d/c summary currently pending. CSW spoke with patient's RN Phineas Semen) regarding d/c. Patient currently pending chest x-ray. CSW to arrange transportation via [facility]. Patient is to be d/c to [facility] room 147 B and report is to be called to [phone]. CSW to continue to follow patient for d/c to [facility].  [place] Weekend Clinical Social Worker [phone]

## 2013-11-08 NOTE — Clinical Social Work Note (Signed)
CSW made aware by RN Wilnette Kales(Thelma) patient ready to be transported to facility. CSW to arrange transportation via [facility]. No further needs. CSW signing off.  Feliciano Wynter Patrick-[place], LCSWA Weekend Clinical Social Worker [phone]

## 2013-11-08 NOTE — Discharge Summary (Signed)
Physician Discharge Summary  Patient ID: Max [name] MRN: [id number] DOB/AGE: [birth year] 64 y.o.  Admit date: 09/28/2013 Discharge date: 11/08/2013    Discharge Diagnoses:  Acute respiratory failure secondary to ISLD of unknown origin L Apical PTX - Leukocytosis  Chronic Pain  GERD                                                                      DISCHARGE PLAN BY DIAGNOSIS      Discharge Plan: Acute respiratory failure secondary to ISLD of unknown origin. He had mild LL ground glass infiltrates on CT abd/pelvis 09/19/2013, then 2 weeks later extensive ISLD bilat on CT chest. 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 On 11/02/13: he gave hx of significant GERD cotnrolled with PPI leading up to admission and findings of esophageal thickening on 09/19/13 CT abdomen. 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  >>GERD Rx - PPI + H2 blockade  >>will need pulmonary follow up with Dr. RChase Callerfor ILD or Tammy Parrett 7-10 days from DC  -  Continue on Oxygen 6/m to keep O2 for sats >88%  L Apical PTX - pigtail placed 10/7, PTX resolved 10/8. CT to water seal 10/8, lung up 10/9 , CT pulled 10/10 . CXR w/ tiny residual right apical PTX , stable on 10/11  >>plan recheck cxr on return to office in 7-10 d   Leukocytosis - secondary to steroid administration  >>recheck CBC in OP as steroids are decreased   Pain management - chronic R hip pain and acute CT pain >>oxy/APAP to 1-2 tabs q 6 PRN. Fentanyl for severe breakthrough pain >>aggressive constipation regimen w/ stool softners, miralax   GERD  >>GERD diet and PPI/H2 blocker                   DISCHARGE SUMMARY   Max MOLDOVANis a 63y.o. y/o male with a PMH of  Sciatica; Bulging discs; Torn rotator cuff; PTSD (post-traumatic stress disorder);  Depression; Anxiety; Hypertension; Sleep apnea; and Syncopal episodes. [facility], chills. Patient was seen in the ED 8 days ago at that time chest x-ray showed bronchitic changes and patient was diagnosed with pneumonia and sent home on by mouth Levaquin along with naproxen for the back pain. Patient has a history of esophageal stricture as per patient and requires dilation of the esophagus.  Chest x-ray  showed interval development of extensive parenchymal infiltrates involving the bilateral lungs concerning for multifocal pneumonia.  He was admitted . CT chest showed extensive bilateral ground-glass attenuation airspace disease with superimposed interlobular septal thickening.  He had minimal response to abx , steroids were added on 9/4. O2 demands increased w/ WOB requiring As needed  BIPAP . Pt was found to be self medicating with pain meds. He has chronic pain hx . He continued to have a slow response and improvement and was started on trial of high dose steroids for bilateral severe infiltrates. Repeat CT chest on 9/21 showed persistent GGO c/w ARDS /atypical PNA. He  was transferred to [facility] on 10/19/13 .  CTS Dr. Nils Pyle consulted and pt underwent Mini Anterior Throacotomy with lung Bx on 9/24. Pathology showed DAD -diffuse alveolar damage with fibrosis nos (Not UIP, no granuloma, autoimmune neg, HIV neg  no asbestosis on CT /Bx.  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  Pt stay complicated by L Apical pneumothorax, pigtail placed on 10/7, PTX resolved and CT removed 10/10. CXR on 10/11 showed very tiny residual PTX on left.  Pt is feeling better and O2 sats are adequate on 6l/m . Chronic pain is being managed on meds.  Steroids are to be tapered very slowly . Will decrease slightly tomorrow 54m daily and hold at this dose until seen in pulmonary clinic. He will remain on GERD treatment with PPI and  H2 blocker and GERD diet. He is an aggressive bowel regimen d/t chronic narcotics and constipation.  He will continue on Oxygen at 6l/ to keep sats >90%  He will be going to rehab center . [place] . He will be transported by EMS transport team [facility].     SIGNIFICANT DIAGNOSTIC STUDIES/EVENTS:  SIGNIFICANT EVENTS:  8/31 admitted to [facility] for presumed HCAP  9/03 CT chest > Extensive bilateral ground-glass attenuation airspace disease with superimposed interlobular septal thickening.  9/04 minimal response to ABX, steroids added.  9/06 Subjective improvement with steroids, PRN BiPAP  9/08 CXR with severe bilateral infiltrates, persistent. C/w ALI  9/12 found to be self-medicating with sedating meds.  9/16 able to reduce FiO2 demand to venti mask, PRN BiPAP at night, off ABX  9[facility]21 still very slow to improve[facility]. Started on high dose steroids from 9/21-9/24  9/21 ct chest > Persistent GGO with some fluctuation in distribution. No other significant change. Most c/w ARDS/ atypical PNA.  9/24 CTS (Dr. VPrescott Gum - Mini Anterior Thoracotomy -Lung Biopsy Left Upper Lobe x 2  9/26 Intermittently very agitated. Peep 8, fio2 35%, RESTART SOLUMEDROL  10/1 tolerating VM without distress. Transfer to med-surg ordered. Methylpred decreased  10/2 awake and alert with no distress on floor  10/6 try po steroids  10/7 left apical pnx worse. Pigtail catheter inserted.  10/9 CT to water seal overnight, lung up on cxr.  10/10 CT removed > no ptx     MICRO BC 9/1 >neg  Sputum cx 9/24 >neg  AFB smear 9/24 > neg >> culture pending  ANTIBIOTICS zithromax 9/1-9/11 Rocephin 9/1-9/2 maxipime 9/2-9/4  CONSULTS CTS   TUBES / LINES PIGTail CT 10/7-10/10  Discharge Exam: General: Overweight male in NAD on 6lpm NP Neuro: Alert, oriented x 4, no focal deficit  HEENT: [place]/AT, PERRL, no JVD  Cardiovascular: RRR  Lungs: resp's  even/non-labored, lungs bilaterally diminished but clear, only a few crackles in bases bilaterally  Abdomen: Soft, non-tender, non-distended  Musculoskeletal: ROM intact, no acute deformity  Skin: Intact, MMM   Filed Vitals:   11/07/13 1426 11/07/13 2111 11/08/13 0538 11/08/13 0549  BP: 100/63 113/76 113/56 112/70  Pulse: 92 93 91   Temp: 98 F (36.7 C) 98.2 F (36.8 C) 97.8 F (36.6 C)   TempSrc: Oral Oral Oral   Resp: 18 18 18    Height:      Weight:      SpO2: 95% 97% 96%      Discharge Labs  BMET  Recent Labs Lab 11/03/13 0530  11/07/13 0451  NA 139 140  K 4.7 4.3  CL 95* 95*  CO2 29 31  GLUCOSE 151* 105*  BUN 21 16  CREATININE 0.79 0.86  CALCIUM 9.0 8.8    CBC  Recent Labs Lab 11/03/13 0530 11/07/13 0451  HGB 13.8 12.9*  HCT 41.7 39.0  WBC 27.7* 21.3*  PLT 331 188    Anti-Coagulation No results found for this basename: INR,  in the last 168 hours  Discharge Instructions   Call MD for:  difficulty breathing, headache or visual disturbances    Complete by:  As directed      Call MD for:  hives    Complete by:  As directed      Call MD for:  persistant dizziness or light-headedness    Complete by:  As directed      Call MD for:  persistant nausea and vomiting    Complete by:  As directed      Call MD for:  redness, tenderness, or signs of infection (pain, swelling, redness, odor or green/yellow discharge around incision site)    Complete by:  As directed      Call MD for:  severe uncontrolled pain    Complete by:  As directed      Call MD for:  temperature >100.4    Complete by:  As directed      Diet - low sodium heart healthy    Complete by:  As directed      Increase activity slowly    Complete by:  As directed      Leave dressing on - Keep it clean, dry, and intact until clinic visit    Complete by:  As directed                 Follow-up Information   Follow up with VAN Wilber Oliphant, MD On 11/18/2013. (CXR PA/LAT to be taken  (at [facility] which is in the same building as Dr. Lucianne Lei Trigt's office) on 11/18/2013 at 3:00pm;Appointment with Dr. Prescott Gum is at 4:00 pm)    Specialty:  Cardiothoracic Surgery   Contact information:   [address] [place] [address] [phone]       Follow up with VAN Wilber Oliphant, MD. Call in 1 day. (Appointment is for chest tube suture removal only. )    Specialty:  Cardiothoracic Surgery   Contact information:   [address] [place] [address] [phone]       Follow up with [facility], MD. Call in 1 day. (for office visit for post hospital follow up in 7-10 days , will need chest xray prior to visit )    Specialty:  Pulmonary Disease   Contact information:   [place] [address] [phone]          Medication List    STOP taking these medications       diazepam 5 MG tablet  Commonly known as:  VALIUM     gabapentin 600 MG tablet  Commonly known as:  NEURONTIN  Replaced by:  gabapentin 400 MG capsule     ibuprofen 800 MG tablet  Commonly known as:  ADVIL,MOTRIN     levofloxacin 750 MG tablet  Commonly known as:  LEVAQUIN     naproxen 500 MG tablet  Commonly known as:  NAPROSYN      TAKE these medications       albuterol 108 (90 BASE)  MCG/ACT inhaler  Commonly known as:  PROVENTIL HFA;VENTOLIN HFA  Inhale 2 puffs into the lungs every 4 (four) hours as needed for wheezing or shortness of breath.     albuterol (2.5 MG/3ML) 0.083% nebulizer solution  Commonly known as:  PROVENTIL  Take 3 mLs (2.5 mg total) by nebulization every 4 (four) hours as needed for wheezing.     amitriptyline 50 MG tablet  Commonly known as:  ELAVIL  Take 1 tablet (50 mg total) by mouth at bedtime as needed for sleep.     bisacodyl 10 MG suppository  Commonly known as:  DULCOLAX  Place 1 suppository (10 mg total) rectally daily as needed for moderate constipation.     clonazePAM 1 MG tablet  Commonly  known as:  KLONOPIN  Take 1 tablet (1 mg total) by mouth 2 (two) times daily.     docusate sodium 100 MG capsule  Commonly known as:  COLACE  Take 1 capsule (100 mg total) by mouth 2 (two) times daily.     escitalopram 20 MG tablet  Commonly known as:  LEXAPRO  Take 20 mg by mouth daily.     famotidine 20 MG tablet  Commonly known as:  PEPCID  Take 1 tablet (20 mg total) by mouth at bedtime.     gabapentin 400 MG capsule  Commonly known as:  NEURONTIN  Take 1 capsule (400 mg total) by mouth 3 (three) times daily.     lisinopril-hydrochlorothiazide 20-12.5 MG per tablet  Commonly known as:  PRINZIDE,ZESTORETIC  Take 1 tablet by mouth daily.     nystatin cream  Commonly known as:  MYCOSTATIN  Apply topically 2 (two) times daily.     oxyCODONE-acetaminophen 5-325 MG per tablet  Commonly known as:  PERCOCET/ROXICET  Take 1-2 tablets by mouth every 6 (six) hours as needed for moderate pain.     pantoprazole 40 MG tablet  Commonly known as:  PROTONIX  Take 1 tablet (40 mg total) by mouth daily at 12 noon.     polyethylene glycol packet  Commonly known as:  MIRALAX / GLYCOLAX  Take 17 g by mouth 3 (three) times daily.     predniSONE 20 MG tablet  Commonly known as:  DELTASONE  Take 2 tablets (40 mg total) by mouth daily with breakfast.  Start taking on:  11/09/2013     QUEtiapine 100 MG tablet  Commonly known as:  SEROQUEL  Take 100 mg by mouth at bedtime.     sucralfate 1 G tablet  Commonly known as:  CARAFATE  Take 1 tablet (1 g total) by mouth 3 (three) times daily with meals.     zolpidem 10 MG tablet  Commonly known as:  AMBIEN  Take 1 tablet (10 mg total) by mouth at bedtime as needed for sleep.          Disposition: Pt discharged to [facility] [place] w/ bed confimed with [facility] transport.   Discharged Condition: Max [name] has met maximum benefit of inpatient care and is medically stable and cleared for discharge.  Patient is pending follow up as above.    sats are 96% on 6lpm at rest   Time spent on disposition:  Greater than 35 minutes.   Signed: Tammy Parrett NP-C  [facility]  [phone]   Pt seen and examined on day of discharge and discussed importance of f/u as planned - agree with a/p as outlined   Christinia Gully, MD Pulmonary and  [place] [phone] After 5:30 PM or weekends, call [phone]

## 2013-11-08 NOTE — Progress Notes (Signed)
Pt prepared for d/c to SNF. PICC d/c'd. Skin intact except as most recently charted. Vitals are stable. Report called to receiving facility. Pt to be transported by ambulance service.

## 2013-11-08 NOTE — Progress Notes (Signed)
      [address]       [place] [id number]             [phone]       17 Days Post-Op Procedure(s) (LRB): VIDEO ASSISTED THORACOSCOPY (VATS)/WEDGE RESECTION (Left)  Subjective: Patient without complaints and wants to go home.  Objective: Vital signs in last 24 hours: Temp:  [97.8 F (36.6 C)-98.2 F (36.8 C)] 97.8 F (36.6 C) (10/11 0538) Pulse Rate:  [91-93] 91 (10/11 0538) Cardiac Rhythm:  [-]  Resp:  [18] 18 (10/11 0538) BP: (100-113)/(56-76) 112/70 mmHg (10/11 0549) SpO2:  [95 %-97 %] 96 % (10/11 0538)     Intake/Output from previous day: 10/10 0701 - 10/11 0700 In: 480 [P.O.:480] Out: 700 [Urine:700]   Physical Exam:  Cardiovascular: RRR Pulmonary: Some coarse breath sounds Abdomen: Soft, non tender, bowel sounds present. Wounds: Clean and dry. No further drainage.   Lab Results: CBC:  Recent Labs  11/07/13 0451  WBC 21.3*  HGB 12.9*  HCT 39.0  PLT 188   BMET:   Recent Labs  11/07/13 0451  NA 140  K 4.3  CL 95*  CO2 31  GLUCOSE 105*  BUN 16  CREATININE 0.86  CALCIUM 8.8    PT/INR: No results found for this basename: LABPROT, INR,  in the last 72 hours ABG:  INR: Will add last result for INR, ABG once components are confirmed Will add last 4 CBG results once components are confirmed  Assessment/Plan:  1.  Pulmonary - acute respiratory failure secondary to ISLD. On Prednisone.  Pigtail catheter removed yesterday.On 6 liters of oxygen. Await CXR 2. Management per medicine and pulmonary  Froilan Mclean MPA-C 11/08/2013,8:13 AM

## 2013-11-09 NOTE — Clinical Social Work Placement (Signed)
Clinical Social Work Department CLINICAL SOCIAL WORK PLACEMENT NOTE 11/09/2013  Patient:  Max [name],[name]  Account Number:  [id number] Admit date:  09/28/2013  Clinical Social Worker:  Cherre BlancJOSEPH BRYANT Jamen [place], [place]  Date/time:  11/03/2013 04:00 PM  Clinical Social Work is seeking post-discharge placement for this patient at the following level of care:   SKILLED NURSING   (*CSW will update this form in Epic as items are completed)   11/03/2013  Patient/family provided with [facility] of Clinical Social Work's list of facilities offering this level of care within the geographic area requested by the patient (or if unable, by the patient's family).  11/03/2013  Patient/family informed of their freedom to choose among providers that offer the needed level of care, that participate in Medicare, Medicaid or managed care program needed by the patient, have an available bed and are willing to accept the patient.  11/03/2013  Patient/family informed of [facility]' ownership interest in [facility], as well as of the fact that they are under no obligation to receive care at this facility.  PASARR submitted to EDS on 11/03/2013 PASARR number received on   FL2 transmitted to all facilities in geographic area requested by pt/family on  11/03/2013 FL2 transmitted to all facilities within larger geographic area on 11/03/2013  Patient informed that his/her managed care company has contracts with or will negotiate with  certain facilities, including the following:     Patient/family informed of bed offers received:  11/05/2013 [facility] [place] Physician recommends and patient chooses bed at    [facility] [place] [facility]  11/08/2013 Patient to be transferred to facility by Ambulance Patient and family notified of transfer on 11/09/2013 Name of family member notified:  Patient notifying  family.  The following physician request were entered in Epic: Physician Request  Please sign FL2.    Additional Comments: Patient DC'ed by weekend CSW.  Roddie McBryant Galena Logie MSW, [place]WA, [place], [phone]

## 2013-11-10 ENCOUNTER — Non-Acute Institutional Stay (SKILLED_NURSING_FACILITY): Payer: Medicaid Other | Admitting: Internal Medicine

## 2013-11-10 ENCOUNTER — Encounter: Payer: Self-pay | Admitting: Internal Medicine

## 2013-11-10 DIAGNOSIS — J189 Pneumonia, unspecified organism: Secondary | ICD-10-CM

## 2013-11-10 DIAGNOSIS — J9383 Other pneumothorax: Secondary | ICD-10-CM

## 2013-11-10 DIAGNOSIS — K219 Gastro-esophageal reflux disease without esophagitis: Secondary | ICD-10-CM

## 2013-11-10 DIAGNOSIS — J849 Interstitial pulmonary disease, unspecified: Secondary | ICD-10-CM
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
## 2013-11-11 ENCOUNTER — Other Ambulatory Visit: Payer: Self-pay | Admitting: *Deleted

## 2013-11-11 ENCOUNTER — Telehealth: Payer: Self-pay | Admitting: Internal Medicine

## 2013-11-11 MED ORDER — OXYCODONE-ACETAMINOPHEN 5-325 MG PO TABS: 1.0000 | ORAL_TABLET | Freq: Four times a day (QID) | ORAL | Status: DC | PRN

## 2013-11-11 NOTE — Telephone Encounter (Signed)
Spoke with Elease HashimotoPatricia (step-daughter), patients O2 needs are being addressed by D/Cing facility. Per Elease HashimotoPatricia, disregard this message because this is being taken care of.  Pt to keep scheduled HFU with MR. Nothing further needed.

## 2013-11-11 NOTE — Telephone Encounter (Signed)
Spoke with Elease Hashimotoatricia, states that patient was D/C 11/08/13, has HFU appt with MR 11/16/13 Seen by MR and MW in hospital.  Wife states that her husband needs home O2 set up and wants this ordered asap.  I advised the Elease Hashimotoatricia that normally the patient will need to follow up with the Dr in office and be evaluated before orders can be placed. Wife requests this be done sooner.  Please advise Dr Marchelle Gearingamaswamy. Thanks.

## 2013-11-11 NOTE — Telephone Encounter (Signed)
He is in a SNF. Cannot the SNF discharte him home with o2? IF they cannot, then get him in ASAP to see me or NP Tammy Parrett. HE cannot be on room air; he will die

## 2013-11-11 NOTE — Telephone Encounter (Signed)
[facility] 

## 2013-11-16 ENCOUNTER — Encounter (HOSPITAL_COMMUNITY): Payer: Self-pay | Admitting: Emergency Medicine

## 2013-11-16 ENCOUNTER — Inpatient Hospital Stay: Payer: Medicaid Other | Admitting: Adult Health

## 2013-11-16 ENCOUNTER — Emergency Department (INDEPENDENT_AMBULATORY_CARE_PROVIDER_SITE_OTHER)
Admission: EM | Admit: 2013-11-16 | Discharge: 2013-11-16 | Discharge status: Against Medical Advice | Payer: Medicaid Other | Source: Home / Self Care

## 2013-11-16 ENCOUNTER — Ambulatory Visit (INDEPENDENT_AMBULATORY_CARE_PROVIDER_SITE_OTHER): Payer: Medicaid Other | Admitting: Internal Medicine

## 2013-11-16 ENCOUNTER — Encounter: Payer: Self-pay | Admitting: Internal Medicine

## 2013-11-16 VITALS — BP 156/92 | HR 127 | Ht 71.0 in | Wt 241.0 lb

## 2013-11-16 DIAGNOSIS — J9383 Other pneumothorax: Secondary | ICD-10-CM

## 2013-11-16 DIAGNOSIS — J849 Interstitial pulmonary disease, unspecified: Secondary | ICD-10-CM | POA: Insufficient documentation

## 2013-11-16 DIAGNOSIS — F1910 Other psychoactive substance abuse, uncomplicated: Secondary | ICD-10-CM

## 2013-11-16 DIAGNOSIS — R0600 Dyspnea, unspecified: Secondary | ICD-10-CM

## 2013-11-16 DIAGNOSIS — J9621 Acute and chronic respiratory failure with hypoxia: Secondary | ICD-10-CM

## 2013-11-16 DIAGNOSIS — F558 Abuse of other non-psychoactive substances: Secondary | ICD-10-CM

## 2013-11-16 DIAGNOSIS — G8918 Other acute postprocedural pain: Secondary | ICD-10-CM

## 2013-11-16 DIAGNOSIS — F1110 Opioid abuse, uncomplicated: Secondary | ICD-10-CM

## 2013-11-16 NOTE — Patient Instructions (Addendum)
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 We are not pain doctors and will not support pain managment  - Please coordinate this with PCP AVBUERE,EDWIN A, MD   #Followup  - return to see my NP Tammyt  in 3 - 4 weeks; will assess for further prednisone taper at that time

## 2013-11-16 NOTE — Progress Notes (Signed)
Subjective:    Patient ID: Max [name], male    DOB: April 02, 1950, 63 y.o.   MRN: [id number]  HPI  OV 11/16/2013  Chief Complaint  Patient presents with  . Follow-up    Pt here for HFU. [facility].   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 Course compicated by PTx. Prolonged hospitalization   - Discharged 7-12 days ago. Went to sNF. Now at home. Still using 6L [place]. Says effort tolerance better. Says at home for ADL's does not desaturate. Continues prednisone at 40mg  per day  Past hx  - has chronic pain. Says run out. Demanding I fill pain meds - refused. He was not too happy about it   Review of Systems  Constitutional: Negative for fever and unexpected weight change.  HENT: Negative for congestion, dental problem, ear pain, nosebleeds, postnasal drip, rhinorrhea, sinus pressure, sneezing, sore throat and trouble swallowing.   Eyes: Negative for redness and itching.  Respiratory: Positive for cough and shortness of breath. Negative for chest tightness and wheezing.   Cardiovascular: Positive for chest pain. Negative for palpitations and leg swelling.  Gastrointestinal: Negative for nausea and vomiting.  Genitourinary: Negative for dysuria.  Musculoskeletal: Negative for joint swelling.  Skin: Negative for rash.  Neurological: Negative for headaches.  Hematological: Does not bruise/bleed easily.  Psychiatric/Behavioral: Negative for dysphoric mood. The patient is not nervous/anxious.    Current outpatient prescriptions:albuterol (PROVENTIL HFA;VENTOLIN HFA) 108 (90 BASE) MCG/ACT inhaler, Inhale 2 puffs into the lungs every 4 (four) hours as needed for wheezing or shortness of breath., Disp: 1 Inhaler, Rfl: 3;  amitriptyline (ELAVIL) 50 MG tablet, Take 1 tablet (50 mg total) by mouth at bedtime as needed for sleep., Disp: , Rfl:  clonazePAM (KLONOPIN) 1 MG tablet, Take 1 tablet (1 mg total) by mouth 2 (two) times daily., Disp: 30 tablet,  Rfl: 0;  escitalopram (LEXAPRO) 20 MG tablet, Take 20 mg by mouth daily.  , Disp: , Rfl: ;  famotidine (PEPCID) 20 MG tablet, Take 1 tablet (20 mg total) by mouth at bedtime., Disp: , Rfl: ;  gabapentin (NEURONTIN) 400 MG capsule, Take 1 capsule (400 mg total) by mouth 3 (three) times daily., Disp: , Rfl:  nystatin cream (MYCOSTATIN), Apply topically 2 (two) times daily., Disp: 30 g, Rfl: 0;  oxyCODONE-acetaminophen (PERCOCET/ROXICET) 5-325 MG per tablet, Take 1-2 tablets by mouth every 6 (six) hours as needed for moderate pain., Disp: 240 tablet, Rfl: 0;  pantoprazole (PROTONIX) 40 MG tablet, Take 1 tablet (40 mg total) by mouth daily at 12 noon., Disp: , Rfl:  polyethylene glycol (MIRALAX / GLYCOLAX) packet, Take 17 g by mouth 3 (three) times daily., Disp: 14 each, Rfl: 0;  predniSONE (DELTASONE) 20 MG tablet, Take 2 tablets (40 mg total) by mouth daily with breakfast., Disp: , Rfl: ;  sucralfate (CARAFATE) 1 G tablet, Take 1 tablet (1 g total) by mouth 3 (three) times daily with meals., Disp: , Rfl:  zolpidem (AMBIEN) 10 MG tablet, Take 1 tablet (10 mg total) by mouth at bedtime as needed for sleep., Disp: 30 tablet, Rfl: 0;  albuterol (PROVENTIL) (2.5 MG/3ML) 0.083% nebulizer solution, Take 3 mLs (2.5 mg total) by nebulization every 4 (four) hours as needed for wheezing., Disp: 75 mL, Rfl: 12;  lisinopril-hydrochlorothiazide (PRINZIDE,ZESTORETIC) 20-12.5 MG per tablet, Take 1 tablet by mouth daily., Disp: , Rfl:  QUEtiapine (SEROQUEL) 100 MG tablet, Take 100 mg by mouth at bedtime., Disp: , Rfl:  Objective:   Physical Exam   Filed Vitals:   11/16/13 1516  BP: 156/92  Pulse: 127  Height: 5\' 11"  (1.803 m)  Weight: 241 lb (109.317 kg)  SpO2: 96%        Assessment & Plan:  #Acute REspiratory Failure following ILD an course complicated by Pneumothorax  - glad you are better  - continue o2 to keep pulse ox > 88% - do CXR 2 view today; will call with results - continue prednisone at 40mg   per day  #Pain Mgmt   - please do not ask our office or doctors for pain pills. We are not pain doctors and will not support pain managment  - Please coordinate this with PCP AVBUERE,EDWIN A, MD   #Followup  - return to see my NP Tammyt  in 3 - 4 weeks; will assess for further prednisone taper at that time   Dr. Kalman ShanMurali Ashe Graybeal, M.D., [facility] Pulmonary and Critical Care Medicine Staff Physician [facility] [facility] Pager: [phone], If no answer or between  15:00h - 7:00h: call [phone]  11/16/2013 3:49 PM   Update : apparently he walked out of office without going for CXR or booking followup appt because I refusd to refull his pain med   Dr. Kalman ShanMurali Tipton Ballow, M.D., [facility] Pulmonary and Critical Care Medicine Staff Physician [facility]  Pulmonary and Critical Care Pager: [phone], If no answer or between  15:00h - 7:00h: call [phone]  11/16/2013 5:46 PM
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
## 2013-11-16 NOTE — ED Notes (Signed)
Pt. left AMA when NP told him he was not going to give him any narcotics.  He was advising by NP to go to the hosp. to be checked for OD of Tylenol/Oxycodone and to get a CXR to check his surgery.

## 2013-11-16 NOTE — ED Notes (Signed)
Went to see Dr. Concepcion ElkAvbuere today and the office was full and would not be able to be seen today.  Needs refill of Percocet 5/325 mg.  Dr. Concepcion ElkAvbuere should be able to see him in a few days.  Just d/c from hospital a little over 1 week ago.  C/o extreme pain in his back and chest.  Had 120 filled on 10/14.  He said it was only 100.  He saw Dr. Chandra Batchamasama today and they listened to his lungs but told him to see his PCP to get the med refill.

## 2013-11-18 ENCOUNTER — Ambulatory Visit (INDEPENDENT_AMBULATORY_CARE_PROVIDER_SITE_OTHER): Payer: Self-pay | Admitting: Cardiothoracic Surgery

## 2013-11-18 ENCOUNTER — Encounter: Payer: Self-pay | Admitting: Cardiothoracic Surgery

## 2013-11-18 ENCOUNTER — Ambulatory Visit: Payer: Medicaid Other | Admitting: Cardiothoracic Surgery

## 2013-11-18 ENCOUNTER — Ambulatory Visit
Admission: RE | Admit: 2013-11-18 | Discharge: 2013-11-18 | Disposition: A | Discharge status: Home / Self Care | Payer: Medicaid Other | Source: Ambulatory Visit | Attending: Internal Medicine | Admitting: Internal Medicine

## 2013-11-18 VITALS — BP 144/95 | HR 109 | Ht 71.0 in | Wt 241.0 lb

## 2013-11-18 DIAGNOSIS — Z9889 Other specified postprocedural states: Secondary | ICD-10-CM

## 2013-11-18 DIAGNOSIS — J9621 Acute and chronic respiratory failure with hypoxia: Secondary | ICD-10-CM

## 2013-11-18 DIAGNOSIS — Z09 Encounter for follow-up examination after completed treatment for conditions other than malignant neoplasm: Secondary | ICD-10-CM
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
## 2013-11-18 NOTE — ED Provider Notes (Signed)
Medical screening examination/treatment/procedure(s) were performed by a resident physician or non-physician practitioner and as the supervising physician I was immediately available for consultation/collaboration.  Orah Sonnen, MD    Shahira Fiske S Derian Dimalanta, MD 11/18/13 0812 

## 2013-11-24 NOTE — Progress Notes (Signed)
Note/chart reviewed. Agree with note.   Harlan Ervine RD, LDN, CNSC [phone] Pager [phone] After Hours Pager   

## 2013-11-24 NOTE — Progress Notes (Signed)
Quick Note:  lmtcb ______ 

## 2013-11-26 DIAGNOSIS — F419 Anxiety disorder, unspecified: Secondary | ICD-10-CM | POA: Insufficient documentation

## 2013-11-26 DIAGNOSIS — J449 Chronic obstructive pulmonary disease, unspecified: Secondary | ICD-10-CM | POA: Insufficient documentation

## 2013-11-26 DIAGNOSIS — F1910 Other psychoactive substance abuse, uncomplicated: Secondary | ICD-10-CM | POA: Insufficient documentation

## 2013-11-27 NOTE — Progress Notes (Signed)
Quick Note:  lmtcb ______ 

## 2013-11-28 DIAGNOSIS — J9611 Chronic respiratory failure with hypoxia: Secondary | ICD-10-CM | POA: Insufficient documentation

## 2013-12-03 NOTE — Progress Notes (Signed)
Quick Note:  Called and spoke to pt. Informed pt of the results and recs per MR. Appt needed with MR or TP. Appt made with TP on 11/9. Pt verbalized all understanding and denied any further questions or concerns at this time. ______

## 2013-12-04 LAB — AFB CULTURE WITH SMEAR (NOT AT ARMC): Acid Fast Smear: NONE SEEN

## 2013-12-07 ENCOUNTER — Ambulatory Visit: Payer: Medicaid Other | Admitting: Adult Health

## 2014-01-07 ENCOUNTER — Ambulatory Visit: Payer: Medicaid Other | Admitting: Adult Health

## 2014-01-18 ENCOUNTER — Encounter (HOSPITAL_COMMUNITY): Payer: Self-pay | Admitting: Emergency Medicine

## 2014-01-18 ENCOUNTER — Emergency Department (HOSPITAL_COMMUNITY)
Admission: EM | Admit: 2014-01-18 | Discharge: 2014-01-18 | Disposition: A | Discharge status: Home / Self Care | Payer: Medicaid Other | Attending: Emergency Medicine | Admitting: Emergency Medicine

## 2014-01-18 DIAGNOSIS — G8929 Other chronic pain: Secondary | ICD-10-CM | POA: Diagnosis not present

## 2014-01-18 DIAGNOSIS — Z87891 Personal history of nicotine dependence: Secondary | ICD-10-CM | POA: Diagnosis not present

## 2014-01-18 DIAGNOSIS — Z79899 Other long term (current) drug therapy: Secondary | ICD-10-CM | POA: Diagnosis not present

## 2014-01-18 DIAGNOSIS — F419 Anxiety disorder, unspecified: Secondary | ICD-10-CM | POA: Diagnosis not present

## 2014-01-18 DIAGNOSIS — M5430 Sciatica, unspecified side: Secondary | ICD-10-CM | POA: Diagnosis not present

## 2014-01-18 DIAGNOSIS — I10 Essential (primary) hypertension: Secondary | ICD-10-CM | POA: Diagnosis not present

## 2014-01-18 DIAGNOSIS — M549 Dorsalgia, unspecified: Secondary | ICD-10-CM | POA: Diagnosis present

## 2014-01-18 DIAGNOSIS — G4730 Sleep apnea, unspecified: Secondary | ICD-10-CM | POA: Insufficient documentation

## 2014-01-18 DIAGNOSIS — F4310 Post-traumatic stress disorder, unspecified: Secondary | ICD-10-CM | POA: Insufficient documentation

## 2014-01-18 MED ORDER — HYDROCODONE-ACETAMINOPHEN 7.5-325 MG PO TABS: 1.0000 | ORAL_TABLET | ORAL | Status: DC | PRN

## 2014-01-18 MED ORDER — HYDROCODONE-ACETAMINOPHEN 5-325 MG PO TABS
2.0000 | ORAL_TABLET | Freq: Once | ORAL | Status: AC
  Administered 2014-01-18: 2 via ORAL
  Filled 2014-01-18: qty 2

## 2014-01-18 MED ORDER — METHOCARBAMOL 500 MG PO TABS
1000.0000 mg | ORAL_TABLET | Freq: Once | ORAL | Status: AC
  Administered 2014-01-18: 1000 mg via ORAL
  Filled 2014-01-18: qty 2

## 2014-01-18 MED ORDER — ONDANSETRON HCL 4 MG PO TABS
4.0000 mg | ORAL_TABLET | Freq: Once | ORAL | Status: AC
  Administered 2014-01-18: 4 mg via ORAL
  Filled 2014-01-18: qty 1

## 2014-01-18 MED ORDER — CARISOPRODOL 350 MG PO TABS: 350.0000 mg | ORAL_TABLET | Freq: Three times a day (TID) | ORAL | Status: DC
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
## 2014-01-18 NOTE — Discharge Instructions (Signed)
Sciatica Sciatica is pain, weakness, numbness, or tingling along the path of the sciatic nerve. The nerve starts in the lower back and runs down the back of each leg. The nerve controls the muscles in the lower leg and in the back of the knee, while also providing sensation to the back of the thigh, lower leg, and the sole of your foot. Sciatica is a symptom of another medical condition. For instance, nerve damage or certain conditions, such as a herniated disk or bone spur on the spine, pinch or put pressure on the sciatic nerve. This causes the pain, weakness, or other sensations normally associated with sciatica. Generally, sciatica only affects one side of the body. CAUSES   Herniated or slipped disc.  Degenerative disk disease.  A pain disorder involving the narrow muscle in the buttocks (piriformis syndrome).  Pelvic injury or fracture.  Pregnancy.  Tumor (rare). SYMPTOMS  Symptoms can vary from mild to very severe. The symptoms usually travel from the low back to the buttocks and down the back of the leg. Symptoms can include:  Mild tingling or dull aches in the lower back, leg, or hip.  Numbness in the back of the calf or sole of the foot.  Burning sensations in the lower back, leg, or hip.  Sharp pains in the lower back, leg, or hip.  Leg weakness.  Severe back pain inhibiting movement. These symptoms may get worse with coughing, sneezing, laughing, or prolonged sitting or standing. Also, being overweight may worsen symptoms. DIAGNOSIS  Your caregiver will perform a physical exam to look for common symptoms of sciatica. He or she may ask you to do certain movements or activities that would trigger sciatic nerve pain. Other tests may be performed to find the cause of the sciatica. These may include:  Blood tests.  X-rays.  Imaging tests, such as an MRI or CT scan. TREATMENT  Treatment is directed at the cause of the sciatic pain. Sometimes, treatment is not necessary  and the pain and discomfort goes away on its own. If treatment is needed, your caregiver may suggest:  Over-the-counter medicines to relieve pain.  Prescription medicines, such as anti-inflammatory medicine, muscle relaxants, or narcotics.  Applying heat or ice to the painful area.  Steroid injections to lessen pain, irritation, and inflammation around the nerve.  Reducing activity during periods of pain.  Exercising and stretching to strengthen your abdomen and improve flexibility of your spine. Your caregiver may suggest losing weight if the extra weight makes the back pain worse.  Physical therapy.  Surgery to eliminate what is pressing or pinching the nerve, such as a bone spur or part of a herniated disk. HOME CARE INSTRUCTIONS   Only take over-the-counter or prescription medicines for pain or discomfort as directed by your caregiver.  Apply ice to the affected area for 20 minutes, 3-4 times a day for the first 48-72 hours. Then try heat in the same way.  Exercise, stretch, or perform your usual activities if these do not aggravate your pain.  Attend physical therapy sessions as directed by your caregiver.  Keep all follow-up appointments as directed by your caregiver.  Do not wear high heels or shoes that do not provide proper support.  Check your mattress to see if it is too soft. A firm mattress may lessen your pain and discomfort. SEEK IMMEDIATE MEDICAL CARE IF:   You lose control of your bowel or bladder (incontinence).  You have increasing weakness in the lower back, pelvis, buttocks,   or legs.  You have redness or swelling of your back.  You have a burning sensation when you urinate.  You have pain that gets worse when you lie down or awakens you at night.  Your pain is worse than you have experienced in the past.  Your pain is lasting longer than 4 weeks.  You are suddenly losing weight without reason. MAKE SURE YOU:  Understand these  instructions.  Will watch your condition.  Will get help right away if you are not doing well or get worse. Document Released: 01/09/2001 Document Revised: 07/17/2011 Document Reviewed: 05/27/2011 Exit[employer] Patient Information 2015 [employer]. This information is not intended to replace advice given to you by your health care provider. Make sure you discuss any questions you have with your health care provider.  

## 2014-01-18 NOTE — ED Notes (Addendum)
Pt reports chronic back pain episode for last several days. Pt denies any new or recent injury.pt reports lower back pain radiating to RLE.

## 2014-01-18 NOTE — ED Notes (Signed)
Pain started two days ago and pain has increased and constant.  Rates pain 10.  Have not taken any pain meds.

## 2014-01-18 NOTE — ED Notes (Signed)
PA at bedside.

## 2014-03-30 ENCOUNTER — Other Ambulatory Visit (HOSPITAL_COMMUNITY): Payer: Self-pay | Admitting: Orthopaedic Surgery

## 2014-03-30 DIAGNOSIS — R52 Pain, unspecified: Secondary | ICD-10-CM

## 2014-04-01 ENCOUNTER — Other Ambulatory Visit (HOSPITAL_COMMUNITY): Payer: Self-pay | Admitting: Orthopaedic Surgery

## 2014-04-01 ENCOUNTER — Ambulatory Visit (HOSPITAL_COMMUNITY)
Admission: RE | Admit: 2014-04-01 | Discharge: 2014-04-01 | Disposition: A | Discharge status: Home / Self Care | Payer: Medicaid Other | Source: Ambulatory Visit | Attending: Orthopaedic Surgery | Admitting: Orthopaedic Surgery

## 2014-04-01 DIAGNOSIS — M4806 Spinal stenosis, lumbar region: Secondary | ICD-10-CM | POA: Insufficient documentation

## 2014-04-01 DIAGNOSIS — M5126 Other intervertebral disc displacement, lumbar region: Secondary | ICD-10-CM | POA: Diagnosis not present

## 2014-04-01 DIAGNOSIS — Z139 Encounter for screening, unspecified: Secondary | ICD-10-CM

## 2014-04-01 DIAGNOSIS — M545 Low back pain: Secondary | ICD-10-CM | POA: Insufficient documentation

## 2014-04-01 DIAGNOSIS — R52 Pain, unspecified: Secondary | ICD-10-CM

## 2014-05-04 ENCOUNTER — Other Ambulatory Visit: Payer: Self-pay | Admitting: Internal Medicine

## 2014-05-14 ENCOUNTER — Emergency Department (HOSPITAL_COMMUNITY)
Admission: EM | Admit: 2014-05-14 | Discharge: 2014-05-14 | Disposition: A | Discharge status: Home / Self Care | Payer: Medicaid Other | Attending: Emergency Medicine | Admitting: Emergency Medicine

## 2014-05-14 ENCOUNTER — Encounter (HOSPITAL_COMMUNITY): Payer: Self-pay | Admitting: *Deleted

## 2014-05-14 DIAGNOSIS — G4730 Sleep apnea, unspecified: Secondary | ICD-10-CM | POA: Diagnosis not present

## 2014-05-14 DIAGNOSIS — F329 Major depressive disorder, single episode, unspecified: Secondary | ICD-10-CM | POA: Insufficient documentation

## 2014-05-14 DIAGNOSIS — Z9981 Dependence on supplemental oxygen: Secondary | ICD-10-CM | POA: Diagnosis not present

## 2014-05-14 DIAGNOSIS — F4310 Post-traumatic stress disorder, unspecified: Secondary | ICD-10-CM | POA: Diagnosis not present

## 2014-05-14 DIAGNOSIS — M5430 Sciatica, unspecified side: Secondary | ICD-10-CM | POA: Diagnosis not present

## 2014-05-14 DIAGNOSIS — M545 Low back pain, unspecified: Secondary | ICD-10-CM

## 2014-05-14 DIAGNOSIS — I10 Essential (primary) hypertension: Secondary | ICD-10-CM | POA: Insufficient documentation

## 2014-05-14 DIAGNOSIS — Z79899 Other long term (current) drug therapy: Secondary | ICD-10-CM | POA: Diagnosis not present

## 2014-05-14 DIAGNOSIS — Z87891 Personal history of nicotine dependence: Secondary | ICD-10-CM | POA: Insufficient documentation

## 2014-05-14 DIAGNOSIS — G8929 Other chronic pain: Secondary | ICD-10-CM | POA: Insufficient documentation

## 2014-05-14 DIAGNOSIS — F419 Anxiety disorder, unspecified: Secondary | ICD-10-CM | POA: Insufficient documentation

## 2014-05-14 MED ORDER — HYDROMORPHONE HCL 2 MG/ML IJ SOLN
2.0000 mg | Freq: Once | INTRAMUSCULAR | Status: AC
  Administered 2014-05-14: 2 mg via INTRAMUSCULAR
  Filled 2014-05-14: qty 1
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
## 2014-05-14 NOTE — ED Notes (Signed)
Pt states lower back pain started this morning, has HX of chronic sciatica.

## 2014-05-14 NOTE — Discharge Instructions (Signed)
Chronic Back Pain  When back pain lasts longer than 3 months, it is called chronic back pain.People with chronic back pain often go through certain periods that are more intense (flare-ups).  CAUSES Chronic back pain can be caused by wear and tear (degeneration) on different structures in your back. These structures include:  The bones of your spine (vertebrae) and the joints surrounding your spinal cord and nerve roots (facets).  The strong, fibrous tissues that connect your vertebrae (ligaments). Degeneration of these structures may result in pressure on your nerves. This can lead to constant pain. HOME CARE INSTRUCTIONS  Avoid bending, heavy lifting, prolonged sitting, and activities which make the problem worse.  Take brief periods of rest throughout the day to reduce your pain. Lying down or standing usually is better than sitting while you are resting.  Take over-the-counter or prescription medicines only as directed by your caregiver. SEEK IMMEDIATE MEDICAL CARE IF:   You have weakness or numbness in one of your legs or feet.  You have trouble controlling your bladder or bowels.  You have nausea, vomiting, abdominal pain, shortness of breath, or fainting. Document Released: 02/23/2004 Document Revised: 04/09/2011 Document Reviewed: 12/30/2010 [employer] Patient Information 2015 [place] [place]. This information is not intended to replace advice given to you by your health care provider. Make sure you discuss any questions you have with your health care provider.   You have received a narcotic injection during your visit here. Do not drive within the next 4 hours.

## 2014-05-14 NOTE — ED Notes (Signed)
Patient upset that he "only got 2mg  of Dilaudid" and that it was IM and not IV. Upon departure patient stated "ill see you tonight, cause I'll be back".

## 2014-08-06 ENCOUNTER — Emergency Department (HOSPITAL_COMMUNITY): Payer: Medicare Other

## 2014-08-06 ENCOUNTER — Encounter (HOSPITAL_COMMUNITY): Payer: Self-pay | Admitting: Emergency Medicine

## 2014-08-06 ENCOUNTER — Inpatient Hospital Stay (HOSPITAL_COMMUNITY)
Admission: EM | Admit: 2014-08-06 | Discharge: 2014-08-10 | DRG: 194 | Disposition: A | Discharge status: Home / Self Care | Payer: Medicare Other | Attending: Internal Medicine | Admitting: Internal Medicine

## 2014-08-06 DIAGNOSIS — Z8249 Family history of ischemic heart disease and other diseases of the circulatory system: Secondary | ICD-10-CM | POA: Diagnosis not present

## 2014-08-06 DIAGNOSIS — F4310 Post-traumatic stress disorder, unspecified: Secondary | ICD-10-CM | POA: Diagnosis present

## 2014-08-06 DIAGNOSIS — N179 Acute kidney failure, unspecified: Secondary | ICD-10-CM | POA: Diagnosis present

## 2014-08-06 DIAGNOSIS — J441 Chronic obstructive pulmonary disease with (acute) exacerbation: Secondary | ICD-10-CM | POA: Diagnosis present

## 2014-08-06 DIAGNOSIS — J189 Pneumonia, unspecified organism: Principal | ICD-10-CM | POA: Diagnosis present

## 2014-08-06 DIAGNOSIS — Z87891 Personal history of nicotine dependence: Secondary | ICD-10-CM | POA: Diagnosis not present

## 2014-08-06 DIAGNOSIS — Z820 Family history of epilepsy and other diseases of the nervous system: Secondary | ICD-10-CM

## 2014-08-06 DIAGNOSIS — J9610 Chronic respiratory failure, unspecified whether with hypoxia or hypercapnia: Secondary | ICD-10-CM | POA: Diagnosis present

## 2014-08-06 DIAGNOSIS — R109 Unspecified abdominal pain: Secondary | ICD-10-CM

## 2014-08-06 DIAGNOSIS — M5441 Lumbago with sciatica, right side: Secondary | ICD-10-CM | POA: Diagnosis not present

## 2014-08-06 DIAGNOSIS — Z79899 Other long term (current) drug therapy: Secondary | ICD-10-CM

## 2014-08-06 DIAGNOSIS — Z833 Family history of diabetes mellitus: Secondary | ICD-10-CM

## 2014-08-06 DIAGNOSIS — M545 Low back pain, unspecified: Secondary | ICD-10-CM | POA: Diagnosis present

## 2014-08-06 DIAGNOSIS — R05 Cough: Secondary | ICD-10-CM | POA: Diagnosis not present

## 2014-08-06 DIAGNOSIS — Z9981 Dependence on supplemental oxygen: Secondary | ICD-10-CM

## 2014-08-06 DIAGNOSIS — Z791 Long term (current) use of non-steroidal anti-inflammatories (NSAID): Secondary | ICD-10-CM

## 2014-08-06 DIAGNOSIS — I10 Essential (primary) hypertension: Secondary | ICD-10-CM | POA: Diagnosis present

## 2014-08-06 DIAGNOSIS — Z79891 Long term (current) use of opiate analgesic: Secondary | ICD-10-CM

## 2014-08-06 DIAGNOSIS — M5430 Sciatica, unspecified side: Secondary | ICD-10-CM | POA: Diagnosis present

## 2014-08-06 LAB — URINALYSIS, ROUTINE W REFLEX MICROSCOPIC
Bilirubin Urine: NEGATIVE
Glucose, UA: NEGATIVE mg/dL
Hgb urine dipstick: NEGATIVE
Ketones, ur: NEGATIVE mg/dL
Leukocytes, UA: NEGATIVE
Nitrite: NEGATIVE
Protein, ur: NEGATIVE mg/dL
Specific Gravity, Urine: 1.025 (ref 1.005–1.030)
Urobilinogen, UA: 1 mg/dL (ref 0.0–1.0)
pH: 6 (ref 5.0–8.0)

## 2014-08-06 LAB — BASIC METABOLIC PANEL
Anion gap: 9 (ref 5–15)
BUN: 27 mg/dL — ABNORMAL HIGH (ref 6–20)
CO2: 25 mmol/L (ref 22–32)
Calcium: 8.4 mg/dL — ABNORMAL LOW (ref 8.9–10.3)
Chloride: 100 mmol/L — ABNORMAL LOW (ref 101–111)
Creatinine, Ser: 1.55 mg/dL — ABNORMAL HIGH (ref 0.61–1.24)
GFR calc Af Amer: 53 mL/min — ABNORMAL LOW (ref >60)
GFR calc non Af Amer: 45 mL/min — ABNORMAL LOW (ref >60)
Glucose, Bld: 151 mg/dL — ABNORMAL HIGH (ref 65–99)
Potassium: 3.7 mmol/L (ref 3.5–5.1)
Sodium: 134 mmol/L — ABNORMAL LOW (ref 135–145)

## 2014-08-06 LAB — CBC WITH DIFFERENTIAL/PLATELET
Basophils Absolute: 0 10*3/uL (ref 0.0–0.1)
Basophils Relative: 0 % (ref 0–1)
Eosinophils Absolute: 0.2 10*3/uL (ref 0.0–0.7)
Eosinophils Relative: 1 % (ref 0–5)
HCT: 43 % (ref 39.0–52.0)
Hemoglobin: 14.5 g/dL (ref 13.0–17.0)
Lymphocytes Relative: 19 % (ref 12–46)
Lymphs Abs: 4 10*3/uL (ref 0.7–4.0)
MCH: 31.4 pg (ref 26.0–34.0)
MCHC: 33.7 g/dL (ref 30.0–36.0)
MCV: 93.1 fL (ref 78.0–100.0)
Monocytes Absolute: 2 10*3/uL — ABNORMAL HIGH (ref 0.1–1.0)
Monocytes Relative: 10 % (ref 3–12)
Neutro Abs: 14.8 10*3/uL — ABNORMAL HIGH (ref 1.7–7.7)
Neutrophils Relative %: 70 % (ref 43–77)
Platelets: 219 10*3/uL (ref 150–400)
RBC: 4.62 MIL/uL (ref 4.22–5.81)
RDW: 14.7 % (ref 11.5–15.5)
WBC: 21 10*3/uL — ABNORMAL HIGH (ref 4.0–10.5)

## 2014-08-06 LAB — LACTIC ACID, PLASMA
Lactic Acid, Venous: 1.7 mmol/L (ref 0.5–2.0)
Lactic Acid, Venous: 0.8 mmol/L (ref 0.5–2.0)

## 2014-08-06 MED ORDER — ALBUTEROL SULFATE (2.5 MG/3ML) 0.083% IN NEBU
2.5000 mg | INHALATION_SOLUTION | Freq: Once | RESPIRATORY_TRACT | Status: AC
Start: 2014-08-06 — End: 2014-08-06
  Administered 2014-08-06: 2.5 mg via RESPIRATORY_TRACT
  Filled 2014-08-06: qty 3

## 2014-08-06 MED ORDER — SODIUM CHLORIDE 0.9 % IV SOLN
INTRAVENOUS | Status: DC
  Administered 2014-08-06 – 2014-08-09 (×4): via INTRAVENOUS

## 2014-08-06 MED ORDER — MELATONIN 5 MG PO TABS: 5.0000 mg | ORAL_TABLET | Freq: Every day | ORAL | Status: DC

## 2014-08-06 MED ORDER — ZOLPIDEM TARTRATE 5 MG PO TABS
5.0000 mg | ORAL_TABLET | Freq: Every evening | ORAL | Status: DC | PRN
  Administered 2014-08-06 – 2014-08-09 (×4): 5 mg via ORAL
  Filled 2014-08-06 (×4): qty 1

## 2014-08-06 MED ORDER — LISINOPRIL 10 MG PO TABS
20.0000 mg | ORAL_TABLET | Freq: Every day | ORAL | Status: DC
  Administered 2014-08-07: 20 mg via ORAL
  Filled 2014-08-06: qty 2

## 2014-08-06 MED ORDER — DIAZEPAM 5 MG PO TABS
5.0000 mg | ORAL_TABLET | Freq: Every day | ORAL | Status: DC | PRN
  Administered 2014-08-06 – 2014-08-09 (×4): 5 mg via ORAL
  Filled 2014-08-06 (×4): qty 1

## 2014-08-06 MED ORDER — MORPHINE SULFATE 4 MG/ML IJ SOLN
8.0000 mg | Freq: Once | INTRAMUSCULAR | Status: AC
  Administered 2014-08-06: 8 mg via INTRAVENOUS
  Filled 2014-08-06: qty 2

## 2014-08-06 MED ORDER — METHYLPREDNISOLONE SODIUM SUCC 125 MG IJ SOLR
125.0000 mg | Freq: Once | INTRAMUSCULAR | Status: AC
  Administered 2014-08-06: 125 mg via INTRAVENOUS
  Filled 2014-08-06: qty 2

## 2014-08-06 MED ORDER — DEXTROSE 5 % IV SOLN
2.0000 g | Freq: Once | INTRAVENOUS | Status: AC
  Administered 2014-08-06: 2 g via INTRAVENOUS
  Filled 2014-08-06: qty 2

## 2014-08-06 MED ORDER — FAMOTIDINE 20 MG PO TABS
20.0000 mg | ORAL_TABLET | Freq: Every day | ORAL | Status: DC
  Administered 2014-08-06 – 2014-08-09 (×4): 20 mg via ORAL
  Filled 2014-08-06 (×4): qty 1

## 2014-08-06 MED ORDER — IPRATROPIUM-ALBUTEROL 0.5-2.5 (3) MG/3ML IN SOLN
3.0000 mL | Freq: Four times a day (QID) | RESPIRATORY_TRACT | Status: DC
  Administered 2014-08-06 – 2014-08-10 (×12): 3 mL via RESPIRATORY_TRACT
  Filled 2014-08-06 (×12): qty 3

## 2014-08-06 MED ORDER — OXYCODONE-ACETAMINOPHEN 5-325 MG PO TABS
1.0000 | ORAL_TABLET | ORAL | Status: DC | PRN
  Administered 2014-08-06 – 2014-08-07 (×4): 2 via ORAL
  Administered 2014-08-07: 1 via ORAL
  Administered 2014-08-08: 2 via ORAL
  Administered 2014-08-08: 1 via ORAL
  Administered 2014-08-08: 2 via ORAL
  Administered 2014-08-09 – 2014-08-10 (×4): 1 via ORAL
  Filled 2014-08-06: qty 2
  Filled 2014-08-06 (×3): qty 1
  Filled 2014-08-06 (×3): qty 2
  Filled 2014-08-06: qty 1
  Filled 2014-08-06: qty 2
  Filled 2014-08-06: qty 1
  Filled 2014-08-06: qty 2
  Filled 2014-08-06: qty 1

## 2014-08-06 MED ORDER — LISINOPRIL-HYDROCHLOROTHIAZIDE 20-12.5 MG PO TABS: 1.0000 | ORAL_TABLET | Freq: Every day | ORAL | Status: DC

## 2014-08-06 MED ORDER — ALBUTEROL SULFATE (2.5 MG/3ML) 0.083% IN NEBU: 2.5000 mg | INHALATION_SOLUTION | RESPIRATORY_TRACT | Status: DC | PRN

## 2014-08-06 MED ORDER — PANTOPRAZOLE SODIUM 40 MG PO TBEC
40.0000 mg | DELAYED_RELEASE_TABLET | Freq: Every day | ORAL | Status: DC
  Administered 2014-08-07 – 2014-08-09 (×3): 40 mg via ORAL
  Filled 2014-08-06 (×3): qty 1

## 2014-08-06 MED ORDER — METHYLPREDNISOLONE SODIUM SUCC 125 MG IJ SOLR
60.0000 mg | Freq: Two times a day (BID) | INTRAMUSCULAR | Status: DC
  Administered 2014-08-07 – 2014-08-09 (×5): 60 mg via INTRAVENOUS
  Filled 2014-08-06 (×5): qty 2

## 2014-08-06 MED ORDER — CEFTRIAXONE SODIUM IN DEXTROSE 20 MG/ML IV SOLN
1.0000 g | INTRAVENOUS | Status: DC
  Filled 2014-08-06 (×3): qty 50

## 2014-08-06 MED ORDER — DEXTROSE 5 % IV SOLN
500.0000 mg | INTRAVENOUS | Status: DC
  Administered 2014-08-06 – 2014-08-07 (×2): 500 mg via INTRAVENOUS
  Filled 2014-08-06 (×4): qty 500

## 2014-08-06 MED ORDER — ENOXAPARIN SODIUM 40 MG/0.4ML ~~LOC~~ SOLN
40.0000 mg | SUBCUTANEOUS | Status: DC
  Administered 2014-08-06 – 2014-08-09 (×4): 40 mg via SUBCUTANEOUS
  Filled 2014-08-06 (×5): qty 0.4

## 2014-08-06 MED ORDER — ONDANSETRON HCL 4 MG/2ML IJ SOLN
4.0000 mg | Freq: Once | INTRAMUSCULAR | Status: AC
  Administered 2014-08-06: 4 mg via INTRAVENOUS
  Filled 2014-08-06: qty 2

## 2014-08-06 MED ORDER — SUCRALFATE 1 G PO TABS
1.0000 g | ORAL_TABLET | Freq: Three times a day (TID) | ORAL | Status: DC
  Administered 2014-08-07 – 2014-08-10 (×9): 1 g via ORAL
  Filled 2014-08-06 (×9): qty 1

## 2014-08-06 MED ORDER — IPRATROPIUM-ALBUTEROL 0.5-2.5 (3) MG/3ML IN SOLN
3.0000 mL | Freq: Once | RESPIRATORY_TRACT | Status: AC
  Administered 2014-08-06: 3 mL via RESPIRATORY_TRACT
  Filled 2014-08-06: qty 3

## 2014-08-06 MED ORDER — QUETIAPINE FUMARATE 100 MG PO TABS
100.0000 mg | ORAL_TABLET | Freq: Every day | ORAL | Status: DC
  Administered 2014-08-06 – 2014-08-09 (×4): 100 mg via ORAL
  Filled 2014-08-06 (×4): qty 1

## 2014-08-06 MED ORDER — ESCITALOPRAM OXALATE 10 MG PO TABS
20.0000 mg | ORAL_TABLET | Freq: Two times a day (BID) | ORAL | Status: DC
  Administered 2014-08-06 – 2014-08-10 (×8): 20 mg via ORAL
  Filled 2014-08-06 (×8): qty 2

## 2014-08-06 MED ORDER — IPRATROPIUM BROMIDE 0.02 % IN SOLN: 0.5000 mg | Freq: Four times a day (QID) | RESPIRATORY_TRACT | Status: DC

## 2014-08-06 MED ORDER — ACETAMINOPHEN 325 MG PO TABS: 650.0000 mg | ORAL_TABLET | Freq: Four times a day (QID) | ORAL | Status: DC | PRN

## 2014-08-06 MED ORDER — ALBUTEROL SULFATE (2.5 MG/3ML) 0.083% IN NEBU: 2.5000 mg | INHALATION_SOLUTION | Freq: Four times a day (QID) | RESPIRATORY_TRACT | Status: DC

## 2014-08-06 MED ORDER — HYDROCHLOROTHIAZIDE 12.5 MG PO CAPS
12.5000 mg | ORAL_CAPSULE | Freq: Every day | ORAL | Status: DC
  Administered 2014-08-07: 12.5 mg via ORAL
  Filled 2014-08-06: qty 1

## 2014-08-06 MED ORDER — SODIUM CHLORIDE 0.9 % IV BOLUS (SEPSIS)
1000.0000 mL | Freq: Once | INTRAVENOUS | Status: AC
Start: 2014-08-06 — End: 2014-08-06
  Administered 2014-08-06: 1000 mL via INTRAVENOUS

## 2014-08-06 MED ORDER — AZITHROMYCIN 250 MG PO TABS: 500.0000 mg | ORAL_TABLET | Freq: Every day | ORAL | Status: DC

## 2014-08-06 MED ORDER — GABAPENTIN 400 MG PO CAPS
1200.0000 mg | ORAL_CAPSULE | Freq: Three times a day (TID) | ORAL | Status: DC
  Administered 2014-08-06 – 2014-08-10 (×12): 1200 mg via ORAL
  Filled 2014-08-06 (×12): qty 3
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
## 2014-08-06 NOTE — ED Notes (Signed)
O2 saturation 89% on ra upon ED arrival, pt placed on O2 at 2L-O2 saturation increased 94%.

## 2014-08-06 NOTE — Progress Notes (Signed)

## 2014-08-06 NOTE — Progress Notes (Signed)
ANTIBIOTIC CONSULT NOTE - INITIAL  Pharmacy Consult for Renal Adjustment Antibiotics Indication: pneumonia  Allergies  Allergen Reactions  . Flexeril [Cyclobenzaprine Hcl] Hives  . Other Rash    Raw tobacco    Patient Measurements: Height: 5\' 11"  (180.3 cm) Weight: 270 lb (122.471 kg) IBW/kg (Calculated) : 75.3 Adjusted Body Weight:   Vital Signs: Temp: 100.9 F (38.3 C) (07/08 1228) Temp Source: Rectal (07/08 1228) BP: 102/66 mmHg (07/08 1511) Pulse Rate: 86 (07/08 1511) Intake/Output from previous day:   Intake/Output from this shift:    Labs:  Recent Labs  08/06/14 1225  WBC 21.0*  HGB 14.5  PLT 219  CREATININE 1.55*   Estimated Creatinine Clearance: 63.3 mL/min (by C-G formula based on Cr of 1.55). No results for input(s): VANCOTROUGH, VANCOPEAK, VANCORANDOM, GENTTROUGH, GENTPEAK, GENTRANDOM, TOBRATROUGH, TOBRAPEAK, TOBRARND, AMIKACINPEAK, AMIKACINTROU, AMIKACIN in the last 72 hours.   Microbiology: No results found for this or any previous visit (from the past 720 hour(s)).  Medical History: Past Medical History  Diagnosis Date  . Sciatica   . Bulging discs     neck  . Torn rotator cuff   . PTSD (post-traumatic stress disorder)   . Depression   . Anxiety   . Hypertension   . Sleep apnea     supposed to wear CPAP but doesn't  . Syncopal episodes     pt states "it happens after i try to get up after smoking a cigarette"    Medications:   (Not in a hospital admission) Assessment: 64 y.o.male, history of sciatica, PTSD, hypertension, COPD with chronic respiratory failure on 2 L nasal cannula at home. Admitted community acquired pneumonia. Azithromycin 500 mg IV and Ceftriaxone 2 GM IV given in ED Medications continued on admission.  Goal of Therapy:  Eradicate infection  Plan:  Continue Ceftriaxone 1 GM IV every 24 hours as ordered on admission Continue Azithromycin 500 mg po every 24 hours as ordered on admission No renal adjustment  necessary.  Raquel Jamesittman, Quanetta Truss HogelandBennett 08/06/2014,4:17 PM
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
## 2014-08-06 NOTE — ED Notes (Signed)
Pt c/o back pain radiating into right hip and productive cough (brown sputum) x 1 day. O2 sats 91% upon EMS arrival, O2 saturation 96% on O2 at 4l. Denies injury/sob. nad noted.

## 2014-08-07 DIAGNOSIS — N179 Acute kidney failure, unspecified: Secondary | ICD-10-CM

## 2014-08-07 DIAGNOSIS — J189 Pneumonia, unspecified organism: Principal | ICD-10-CM

## 2014-08-07 DIAGNOSIS — J441 Chronic obstructive pulmonary disease with (acute) exacerbation: Secondary | ICD-10-CM

## 2014-08-07 LAB — CBC
HCT: 41.6 % (ref 39.0–52.0)
Hemoglobin: 13.5 g/dL (ref 13.0–17.0)
MCH: 30.8 pg (ref 26.0–34.0)
MCHC: 32.5 g/dL (ref 30.0–36.0)
MCV: 94.8 fL (ref 78.0–100.0)
Platelets: 225 10*3/uL (ref 150–400)
RBC: 4.39 MIL/uL (ref 4.22–5.81)
RDW: 14.7 % (ref 11.5–15.5)
WBC: 10.7 10*3/uL — ABNORMAL HIGH (ref 4.0–10.5)

## 2014-08-07 LAB — BASIC METABOLIC PANEL
Anion gap: 7 (ref 5–15)
BUN: 27 mg/dL — ABNORMAL HIGH (ref 6–20)
CO2: 24 mmol/L (ref 22–32)
Calcium: 8.4 mg/dL — ABNORMAL LOW (ref 8.9–10.3)
Chloride: 104 mmol/L (ref 101–111)
Creatinine, Ser: 1.3 mg/dL — ABNORMAL HIGH (ref 0.61–1.24)
GFR calc Af Amer: >60 mL/min (ref >60)
GFR calc non Af Amer: 56 mL/min — ABNORMAL LOW (ref >60)
Glucose, Bld: 179 mg/dL — ABNORMAL HIGH (ref 65–99)
Potassium: 4.9 mmol/L (ref 3.5–5.1)
Sodium: 135 mmol/L (ref 135–145)

## 2014-08-07 LAB — HIV ANTIBODY (ROUTINE TESTING W REFLEX): HIV Screen 4th Generation wRfx: NONREACTIVE

## 2014-08-07 MED ORDER — DEXTROSE 5 % IV SOLN
1.0000 g | INTRAVENOUS | Status: DC
  Administered 2014-08-07 – 2014-08-09 (×3): 1 g via INTRAVENOUS
  Filled 2014-08-07 (×4): qty 10

## 2014-08-07 MED ORDER — IPRATROPIUM-ALBUTEROL 0.5-2.5 (3) MG/3ML IN SOLN: 3.0000 mL | Freq: Four times a day (QID) | RESPIRATORY_TRACT | Status: DC
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
## 2014-08-07 NOTE — Progress Notes (Signed)
Patient had personal medications in room.  Medications taken from patient,  Counted, given to supervisor to be locked in pharmacy.  Patient's pink trash can which contained medications returned to patient room.

## 2014-08-07 NOTE — Progress Notes (Signed)
Patient refused medicine after placed in nebulizer.

## 2014-08-07 NOTE — Progress Notes (Signed)
Utilization review Completed Lenzie Sandler RN BSN   

## 2014-08-08 LAB — BASIC METABOLIC PANEL
Anion gap: 7 (ref 5–15)
BUN: 32 mg/dL — ABNORMAL HIGH (ref 6–20)
CO2: 24 mmol/L (ref 22–32)
Calcium: 8.5 mg/dL — ABNORMAL LOW (ref 8.9–10.3)
Chloride: 107 mmol/L (ref 101–111)
Creatinine, Ser: 1.19 mg/dL (ref 0.61–1.24)
GFR calc Af Amer: >60 mL/min (ref >60)
GFR calc non Af Amer: >60 mL/min (ref >60)
Glucose, Bld: 177 mg/dL — ABNORMAL HIGH (ref 65–99)
Potassium: 4.7 mmol/L (ref 3.5–5.1)
Sodium: 138 mmol/L (ref 135–145)

## 2014-08-08 MED ORDER — AZITHROMYCIN 250 MG PO TABS
500.0000 mg | ORAL_TABLET | Freq: Every day | ORAL | Status: DC
  Administered 2014-08-08 – 2014-08-09 (×2): 500 mg via ORAL
  Filled 2014-08-08 (×2): qty 2

## 2014-08-08 NOTE — Progress Notes (Signed)
PHARMACIST - PHYSICIAN COMMUNICATION DR:    CONCERNING: Antibiotic IV to Oral Route Change Policy  RECOMMENDATION: This patient is receiving Azithromycin  by the intravenous route.  Based on criteria approved by the Pharmacy and Therapeutics Committee, the antibiotic(s) is/are being converted to the equivalent oral dose form(s).   DESCRIPTION: These criteria include:  Patient being treated for a respiratory tract infection, urinary tract infection, cellulitis or clostridium difficile associated diarrhea if on metronidazole  The patient is not neutropenic and does not exhibit a GI malabsorption state  The patient is eating (either orally or via tube) and/or has been taking other orally administered medications for a least 24 hours  The patient is improving clinically and has a Tmax < 100.5  If you have questions about this conversion, please contact the Pharmacy Department  [x]  ( [phone] )   []  ( [phone]  [facility] []  ( [phone]  [place] []  ( [phone] )  [facility] []  ( [phone] )  [facility]  
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
## 2014-08-09 MED ORDER — FUROSEMIDE 10 MG/ML IJ SOLN
20.0000 mg | Freq: Once | INTRAMUSCULAR | Status: AC
  Administered 2014-08-09: 20 mg via INTRAVENOUS
  Filled 2014-08-09: qty 2

## 2014-08-09 MED ORDER — PREDNISONE 20 MG PO TABS
40.0000 mg | ORAL_TABLET | Freq: Every day | ORAL | Status: DC
  Administered 2014-08-10: 40 mg via ORAL
  Filled 2014-08-09: qty 2

## 2014-08-09 MED ORDER — DIAZEPAM 5 MG PO TABS
5.0000 mg | ORAL_TABLET | Freq: Three times a day (TID) | ORAL | Status: DC
  Administered 2014-08-09 – 2014-08-10 (×3): 5 mg via ORAL
  Filled 2014-08-09 (×3): qty 1

## 2014-08-09 MED ORDER — LEVOFLOXACIN IN D5W 750 MG/150ML IV SOLN
750.0000 mg | INTRAVENOUS | Status: DC
  Administered 2014-08-09: 750 mg via INTRAVENOUS
  Filled 2014-08-09: qty 150

## 2014-08-09 MED ORDER — LEVOFLOXACIN 750 MG PO TABS
750.0000 mg | ORAL_TABLET | Freq: Every day | ORAL | Status: DC
  Administered 2014-08-10: 750 mg via ORAL
  Filled 2014-08-09: qty 1
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
## 2014-08-09 NOTE — Evaluation (Signed)
Physical Therapy Evaluation Patient Details Name: Max [name] MRN: [id number] DOB: 01-16-1951 Today's Date: 08/09/2014   History of Present Illness  With a history of sciatica, PTSD, hypertension, COPD with chronic respiratory failure on 2 L nasal cannula at home, history of hospitalization needing intubation for respiratory failure secondary to pneumonia. Patient presents to the emergency department after 2 days of increased cough with sputum and shortness of breath that increases with exertion and improves with rest. Additionally, has been having increased sharp right lower back pain that radiates into his buttock. Pain is severe enough to make walking difficult. Has been treated for back pain in the past with back injections, steroids, pain medicine.  Clinical Impression   Pt was seen for evaluation.  He was alert and oriented, very cooperative and on 3 L O2.  He reports his chronic lumbar pain to be at a very low level today and he has had many sessions of PT through the years to learn posture/body mechanics/exercise to help control it.  He was taken off of supplemental O2 during my visit.  His strength and balance are WNL.  He is independent in bed mobility and transfers and able to ambulate on level and slope with a cane for 300' and good stability.  His O2 sat following gait on room air=93%.  He should not need any follow up PT.    Follow Up Recommendations No PT follow up    Equipment Recommendations  None recommended by PT    Recommendations for Other Services   none    Precautions / Restrictions Precautions Precautions: None Restrictions Weight Bearing Restrictions: No      Mobility  Bed Mobility Overal bed mobility: Independent                Transfers Overall transfer level: Independent                  Ambulation/Gait Ambulation/Gait assistance: Independent Ambulation Distance (Feet): 300 Feet Assistive device: Straight cane Gait  Pattern/deviations: WFL(Within Functional Limits)   Gait velocity interpretation: >2.62 ft/sec, indicative of independent Company secretary Rank[employer] (Stroke Patients Only)       Balance Overall balance assessment: Independent                                           Pertinent Vitals/Pain Pain Assessment: No/denies pain    Home Living Family/patient expects to be discharged to:: Private residence Living Arrangements: Spouse/significant other Available Help at Discharge: Friend(s);Available 24 hours/day Type of Home: Apartment Home Access: Ramped entrance     Home Layout: One level Home Equipment: Walker - 2 wheels;Cane - single point;Bedside commode      Prior Function Level of Independence: Independent with assistive device(s)         Comments: uses walker inside of the home and cane outside of the home     Hand Dominance        Extremity/Trunk Assessment               Lower Extremity Assessment: Overall WFL for tasks assessed      Cervical / Trunk Assessment: Normal  Communication   Communication: No difficulties  Cognition Arousal/Alertness: Awake/alert Behavior During Therapy: WFL for tasks assessed/performed Overall Cognitive Status: Within Functional Limits  for tasks assessed                      General Comments      Exercises        Assessment/Plan    PT Assessment Patent does not need any further PT services  PT Diagnosis     PT Problem List    PT Treatment Interventions     PT Goals (Current goals can be found in the Care Plan section) Acute Rehab PT Goals PT Goal Formulation: All assessment and education complete, DC therapy    Frequency     Barriers to discharge        Co-evaluation               End of Session Equipment Utilized During Treatment: Gait belt Activity Tolerance: Patient tolerated treatment well Patient  left: in chair;with call bell/phone within reach      Functional Assessment Tool Used: clinical judgement Functional Limitation: Mobility: Walking and moving around Mobility: Walking and Moving Around Current Status [phone](G8978): 0 percent impaired, limited or restricted Mobility: Walking and Moving Around Goal Status [phone](G8979): 0 percent impaired, limited or restricted Mobility: Walking and Moving Around Discharge Status [phone](G8980): 0 percent impaired, limited or restricted    Time: [id number] PT Time Calculation (min) (ACUTE ONLY): 22 min   Charges:   PT Evaluation $Initial PT Evaluation Tier I: 1 Procedure     PT G Codes:   PT G-Codes **NOT FOR INPATIENT CLASS** Functional Assessment Tool Used: clinical judgement Functional Limitation: Mobility: Walking and moving around Mobility: Walking and Moving Around Current Status ([id number](G8978): 0 percent impaired, limited or restricted Mobility: Walking and Moving Around Goal Status ([id number](G8979): 0 percent impaired, limited or restricted Mobility: Walking and Moving Around Discharge Status ([phone](G8980): 0 percent impaired, limited or restricted    Myrlene BrokerBrown, [place] Janus L  PT 08/09/2014, 10:00 AM [phone]

## 2014-08-09 NOTE — Care Management Note (Signed)
Case Management Note  Patient Details  Name: Max [name] MRN: [id number] Date of Birth: 1950/02/06  Subjective/Objective:                  Pt admitted from home with pneumonia. Pt lives with his significant other and will return home at discharge. Pt has a cane, home O2 with [facility], and neb machine for home use.  Action/Plan: Anticipate discharge within 24 hours. No CM needs noted.  Expected Discharge Date:                  Expected Discharge Plan:  Home/Self Care  In-House Referral:  NA  Discharge planning Services  CM Consult  Post Acute Care Choice:  NA Choice offered to:  NA  DME Arranged:    DME Agency:     HH Arranged:    HH Agency:     Status of Service:  Completed, signed off  Medicare Important Message Given:    Date Medicare IM Given:    Medicare IM give by:    Date Additional Medicare IM Given:    Additional Medicare Important Message give by:     If discussed at Long Length of Stay Meetings, dates discussed:    Additional Comments:  Cheryl FlashBlackwell, Kaiya Boatman Crowder, RN 08/09/2014, 11:10 AM

## 2014-08-10 DIAGNOSIS — M5441 Lumbago with sciatica, right side: Secondary | ICD-10-CM

## 2014-08-10 MED ORDER — OXYCODONE-ACETAMINOPHEN 5-325 MG PO TABS: 1.0000 | ORAL_TABLET | ORAL | Status: DC | PRN

## 2014-08-10 MED ORDER — LEVOFLOXACIN 750 MG PO TABS: 750.0000 mg | ORAL_TABLET | Freq: Every day | ORAL | Status: DC

## 2014-08-10 MED ORDER — PREDNISONE 20 MG PO TABS: 40.0000 mg | ORAL_TABLET | Freq: Every day | ORAL | Status: DC

## 2014-08-10 MED ORDER — DIAZEPAM 5 MG PO TABS: 5.0000 mg | ORAL_TABLET | Freq: Three times a day (TID) | ORAL | Status: DC

## 2014-08-10 NOTE — Discharge Summary (Signed)
Physician Discharge Summary  Max SereneJerry D Leedom YNW:[id number] DOB: [birth year] DOA: 08/06/2014  PCP: Dorrene GermanAVBUERE,EDWIN A, MD  Admit date: 08/06/2014 Discharge date: 08/10/2014  Time spent: 35 minutes  Recommendations for Outpatient Follow-up:  1. Follow up with your PCP in one week.   Discharge Diagnoses:  Principal Problem:   CAP (community acquired pneumonia) Active Problems:   Acute renal injury   Lumbar back pain   COPD exacerbation   Discharge Condition:  Improved, ambulate without SOB>   Diet recommendation: cardiac healthy diet.   Filed Weights   08/06/14 1121 08/06/14 1730  Weight: 122.471 kg (270 lb) 120.974 kg (266 lb 11.2 oz)    History of present illness: Patient was admitted into the hospital for CAP, SOB, and back pain with coughs, by Dr Adrian BlackwaterStinson on August 06, 2014.  As per his H and P: "Max [name] is a 64 y.o. [name]  With a history of sciatica, PTSD, hypertension, COPD with chronic respiratory failure on 2 L nasal cannula at home, history of hospitalization needing intubation for respiratory failure secondary to pneumonia. Patient presents to the emergency department after 2 days of increased cough with sputum and shortness of breath that increases with exertion and improves with rest. Additionally, has been having increased sharp right lower back pain that radiates into his buttock. Pain is severe enough to make walking difficult. Has been treated for back pain in the past with back injections, steroids, pain medicine.   Hospital Course: Max [name] is an 64 y.o. [name] with hx of chronic respiratory failure on chronic home oxygen, s/p VATS with wedge resection, sciatica, PTSD, HTN, sleep apnea not wearing CPAP, admitted for coughs and right back pain. He was wheezing with CXR showing lingula PNA. He was given IV Steroids, nebs PRN, and IV Rocephin and Zithromax. He also was found to have mild AKI with Cr 1.5, improved to 1.3 with IVF> He had marked leukocytosis with WBC of  21K, improved to 10.7K  He continued to improve. He asked to increase his Valium to 5mg  TID. He had been on this dose before, and did well.  His back pain resolved, and he was placed on his percocet used previously.  He was subsequently switch to oral prednisone and oral Levaquin, and on the day of discharge, he has no wheezing.  He has baseline oxygen requirement and has home oxygen.  He requested some Valium and Percocet, and was given Valium 5mg  #60 and Percocet 5mg  #20.  He will take Levaquin for 5 more days and Prednisone at 20mg  BID for 5 days as well.  He will follow up with PCP in one week.  Thank you and good day.    Discharge Exam: Filed Vitals:   08/10/14 0615  BP: 149/91  Pulse: 63  Temp: 98 F (36.7 C)  Resp: 18    Discharge Instructions   Discharge Instructions    Diet - low sodium heart healthy    Complete by:  As directed      Increase activity slowly    Complete by:  As directed           Current Discharge Medication List    START taking these medications   Details  levofloxacin (LEVAQUIN) 750 MG tablet Take 1 tablet (750 mg total) by mouth daily. Qty: 5 tablet, Refills: 0    oxyCODONE-acetaminophen (PERCOCET/ROXICET) 5-325 MG per tablet Take 1-2 tablets by mouth every 4 (four) hours as needed for moderate pain. Qty: 20 tablet, Refills: 0  predniSONE (DELTASONE) 20 MG tablet Take 2 tablets (40 mg total) by mouth daily before breakfast. Qty: 10 tablet, Refills: 0      CONTINUE these medications which have CHANGED   Details  diazepam (VALIUM) 5 MG tablet Take 1 tablet (5 mg total) by mouth 3 (three) times daily. Qty: 60 tablet, Refills: 0      CONTINUE these medications which have NOT CHANGED   Details  escitalopram (LEXAPRO) 20 MG tablet Take 20 mg by mouth 2 (two) times daily.     famotidine (PEPCID) 20 MG tablet Take 1 tablet (20 mg total) by mouth at bedtime.    gabapentin (NEURONTIN) 300 MG capsule Take 1,200 mg by mouth 3 (three) times daily.     lisinopril-hydrochlorothiazide (PRINZIDE,ZESTORETIC) 20-12.5 MG per tablet Take 1 tablet by mouth daily.    Melatonin 5 MG TABS Take 5 mg by mouth at bedtime.     pantoprazole (PROTONIX) 40 MG tablet Take 1 tablet (40 mg total) by mouth daily at 12 noon.    QUEtiapine (SEROQUEL) 100 MG tablet Take 100 mg by mouth at bedtime.    sucralfate (CARAFATE) 1 G tablet Take 1 tablet (1 g total) by mouth 3 (three) times daily with meals.    zolpidem (AMBIEN) 10 MG tablet Take 1 tablet (10 mg total) by mouth at bedtime as needed for sleep. Qty: 30 tablet, Refills: 0    albuterol (PROVENTIL HFA;VENTOLIN HFA) 108 (90 BASE) MCG/ACT inhaler Inhale 2 puffs into the lungs every 4 (four) hours as needed for wheezing or shortness of breath. Qty: 1 Inhaler, Refills: 3      STOP taking these medications     albuterol (PROVENTIL) (2.5 MG/3ML) 0.083% nebulizer solution      ibuprofen (ADVIL,MOTRIN) 800 MG tablet      oxyCODONE-acetaminophen (PERCOCET) 7.5-325 MG per tablet        Allergies  Allergen Reactions  . Flexeril [Cyclobenzaprine Hcl] Hives  . Other Rash    Raw tobacco      The results of significant diagnostics from this hospitalization (including imaging, microbiology, ancillary and laboratory) are listed below for reference.    Significant Diagnostic Studies: Dg Chest 2 View  08/06/2014   CLINICAL DATA:  64 year old [name] with productive cough and shortness of breath and right-sided back pain since yesterday. History of pneumonia.  EXAM: CHEST  2 VIEW  COMPARISON:  Chest radiograph dated 11/23/2013  FINDINGS: Two views of the chest demonstrate an area of increased airspace opacity involving the lingula concerning for pneumonia. There is silhouetting of the left cardiac border. No significant pleural effusion, no pneumothorax. The osseous structures are grossly unremarkable.  IMPRESSION: Lingular pneumonia. Clinical correlation and follow-up to resolution recommended.   Electronically  Signed   By: Elgie Collard M.D.   On: 08/06/2014 12:43    Microbiology: Recent Results (from the past 240 hour(s))  Culture, blood (routine x 2) Call MD if unable to obtain prior to antibiotics being given     Status: None (Preliminary result)   Collection Time: 08/06/14  6:55 PM  Result Value Ref Range Status   Specimen Description BLOOD LEFT ARM  Final   Special Requests BOTTLES DRAWN AEROBIC AND ANAEROBIC 8CC  Final   Culture NO GROWTH 4 DAYS  Final   Report Status PENDING  Incomplete  Culture, blood (routine x 2) Call MD if unable to obtain prior to antibiotics being given     Status: None (Preliminary result)   Collection Time: 08/06/14  7:05  PM  Result Value Ref Range Status   Specimen Description BLOOD LEFT HAND  Final   Special Requests BOTTLES DRAWN AEROBIC AND ANAEROBIC 7CC  Final   Culture NO GROWTH 4 DAYS  Final   Report Status PENDING  Incomplete     Labs: Basic Metabolic Panel:  Recent Labs Lab 08/06/14 1225 08/07/14 0617 08/08/14 0555  NA 134* 135 138  K 3.7 4.9 4.7  CL 100* 104 107  CO2 GLUCOSE 151* 179* 177*  BUN 27* 27* 32*  CREATININE 1.55* 1.30* 1.19  CALCIUM 8.4* 8.4* 8.5*   CBC:  Recent Labs Lab 08/06/14 1225 08/07/14 0617  WBC 21.0* 10.7*  NEUTROABS 14.8*  --   HGB 14.5 13.5  HCT 43.0 41.6  MCV 93.1 94.8  PLT 219 225    Signed:  Deuce Paternoster  [facility] 08/10/2014, 12:34 PM

## 2014-08-10 NOTE — Care Management Note (Signed)
Case Management Note  Patient Details  Name: Max [name] MRN: [id number] Date of Birth: 26-Jun-1950  Subjective/Objective:                    Action/Plan:   Expected Discharge Date:                  Expected Discharge Plan:  Home/Self Care  In-House Referral:  NA  Discharge planning Services  CM Consult  Post Acute Care Choice:  NA Choice offered to:  NA  DME Arranged:    DME Agency:     HH Arranged:    HH Agency:     Status of Service:  Completed, signed off  Medicare Important Message Given:    Date Medicare IM Given:    Medicare IM give by:    Date Additional Medicare IM Given:    Additional Medicare Important Message give by:     If discussed at Long Length of Stay Meetings, dates discussed:    Additional Comments: Pt discharged home today. No CM needs noted. Arlyss QueenBlackwell, Xzaiver Vayda Entiatrowder, RN 08/10/2014, 1:02 PM

## 2014-08-10 NOTE — Care Management Note (Signed)
Case Management Note  Patient Details  Name: Max [name] MRN: [id number] Date of Birth: 12-Feb-1950  Subjective/Objective:                    Action/Plan:   Expected Discharge Date:                  Expected Discharge Plan:  Home/Self Care  In-House Referral:  NA  Discharge planning Services  CM Consult  Post Acute Care Choice:  NA Choice offered to:  NA  DME Arranged:    DME Agency:     HH Arranged:    HH Agency:     Status of Service:  Completed, signed off  Medicare Important Message Given:    Date Medicare IM Given:    Medicare IM give by:    Date Additional Medicare IM Given:    Additional Medicare Important Message give by:     If discussed at Long Length of Stay Meetings, dates discussed:    Additional Comments: Pt discharged home today. NO CM needs noted. Arlyss QueenBlackwell, Adalid Beckmann Rodeorowder, RN 08/10/2014, 1:09 PM

## 2014-08-10 NOTE — Progress Notes (Signed)
Discharge instruction reviewed with patient. Prescription given to patient. Patient waiting for his sister to take him home.

## 2014-08-11 LAB — CULTURE, BLOOD (ROUTINE X 2)
Culture: NO GROWTH
Culture: NO GROWTH

## 2014-09-02 ENCOUNTER — Encounter (HOSPITAL_COMMUNITY): Payer: Self-pay | Admitting: Emergency Medicine

## 2014-09-02 ENCOUNTER — Emergency Department (HOSPITAL_COMMUNITY): Payer: Medicare Other

## 2014-09-02 ENCOUNTER — Emergency Department (HOSPITAL_COMMUNITY)
Admission: EM | Admit: 2014-09-02 | Discharge: 2014-09-02 | Disposition: A | Discharge status: Home / Self Care | Payer: Medicare Other | Attending: Emergency Medicine | Admitting: Emergency Medicine

## 2014-09-02 DIAGNOSIS — R220 Localized swelling, mass and lump, head: Secondary | ICD-10-CM | POA: Diagnosis present

## 2014-09-02 DIAGNOSIS — Z87891 Personal history of nicotine dependence: Secondary | ICD-10-CM | POA: Insufficient documentation

## 2014-09-02 DIAGNOSIS — L03211 Cellulitis of face: Secondary | ICD-10-CM | POA: Diagnosis not present

## 2014-09-02 DIAGNOSIS — Z8739 Personal history of other diseases of the musculoskeletal system and connective tissue: Secondary | ICD-10-CM | POA: Insufficient documentation

## 2014-09-02 DIAGNOSIS — I10 Essential (primary) hypertension: Secondary | ICD-10-CM | POA: Diagnosis not present

## 2014-09-02 DIAGNOSIS — Z79899 Other long term (current) drug therapy: Secondary | ICD-10-CM | POA: Diagnosis not present

## 2014-09-02 DIAGNOSIS — F419 Anxiety disorder, unspecified: Secondary | ICD-10-CM | POA: Diagnosis not present

## 2014-09-02 DIAGNOSIS — Z9981 Dependence on supplemental oxygen: Secondary | ICD-10-CM | POA: Diagnosis not present

## 2014-09-02 DIAGNOSIS — G8929 Other chronic pain: Secondary | ICD-10-CM | POA: Diagnosis not present

## 2014-09-02 DIAGNOSIS — F329 Major depressive disorder, single episode, unspecified: Secondary | ICD-10-CM | POA: Insufficient documentation

## 2014-09-02 HISTORY — DX: Dependence on supplemental oxygen: Z99.81

## 2014-09-02 HISTORY — DX: Other chronic pain: G89.29

## 2014-09-02 LAB — CBC WITH DIFFERENTIAL/PLATELET
Basophils Absolute: 0 10*3/uL (ref 0.0–0.1)
Basophils Relative: 1 % (ref 0–1)
Eosinophils Absolute: 0.2 10*3/uL (ref 0.0–0.7)
Eosinophils Relative: 3 % (ref 0–5)
HCT: 42.2 % (ref 39.0–52.0)
Hemoglobin: 14.1 g/dL (ref 13.0–17.0)
Lymphocytes Relative: 38 % (ref 12–46)
Lymphs Abs: 3.2 10*3/uL (ref 0.7–4.0)
MCH: 31.5 pg (ref 26.0–34.0)
MCHC: 33.4 g/dL (ref 30.0–36.0)
MCV: 94.4 fL (ref 78.0–100.0)
Monocytes Absolute: 1.4 10*3/uL — ABNORMAL HIGH (ref 0.1–1.0)
Monocytes Relative: 17 % — ABNORMAL HIGH (ref 3–12)
Neutro Abs: 3.6 10*3/uL (ref 1.7–7.7)
Neutrophils Relative %: 43 % (ref 43–77)
Platelets: 289 10*3/uL (ref 150–400)
RBC: 4.47 MIL/uL (ref 4.22–5.81)
RDW: 14.6 % (ref 11.5–15.5)
WBC: 8.5 10*3/uL (ref 4.0–10.5)

## 2014-09-02 LAB — BASIC METABOLIC PANEL
Anion gap: 9 (ref 5–15)
BUN: 14 mg/dL (ref 6–20)
CO2: 27 mmol/L (ref 22–32)
Calcium: 8.9 mg/dL (ref 8.9–10.3)
Chloride: 103 mmol/L (ref 101–111)
Creatinine, Ser: 1.04 mg/dL (ref 0.61–1.24)
GFR calc Af Amer: >60 mL/min (ref >60)
GFR calc non Af Amer: >60 mL/min (ref >60)
Glucose, Bld: 99 mg/dL (ref 65–99)
Potassium: 3.8 mmol/L (ref 3.5–5.1)
Sodium: 139 mmol/L (ref 135–145)

## 2014-09-02 LAB — LACTIC ACID, PLASMA: Lactic Acid, Venous: 0.9 mmol/L (ref 0.5–2.0)

## 2014-09-02 MED ORDER — MORPHINE SULFATE 4 MG/ML IJ SOLN
4.0000 mg | INTRAMUSCULAR | Status: AC | PRN
  Administered 2014-09-02 (×2): 4 mg via INTRAVENOUS
  Filled 2014-09-02 (×2): qty 1

## 2014-09-02 MED ORDER — SODIUM CHLORIDE 0.9 % IV SOLN
INTRAVENOUS | Status: DC
  Administered 2014-09-02: 1000 mL via INTRAVENOUS

## 2014-09-02 MED ORDER — IOHEXOL 300 MG/ML  SOLN
75.0000 mL | Freq: Once | INTRAMUSCULAR | Status: AC | PRN
  Administered 2014-09-02: 75 mL via INTRAVENOUS

## 2014-09-02 MED ORDER — CLINDAMYCIN PHOSPHATE 900 MG/50ML IV SOLN
900.0000 mg | Freq: Once | INTRAVENOUS | Status: AC
  Administered 2014-09-02: 900 mg via INTRAVENOUS
  Filled 2014-09-02: qty 50

## 2014-09-02 MED ORDER — CLINDAMYCIN HCL 150 MG PO CAPS: 300.0000 mg | ORAL_CAPSULE | Freq: Three times a day (TID) | ORAL | Status: AC

## 2014-09-02 NOTE — ED Notes (Signed)
Pt states he had a black head under his lip yesterday and picked at it, today swelling of lip and neck, feels tight in throat , pt states he has not eaten today

## 2014-09-02 NOTE — ED Notes (Signed)
Pt used 3L home 02

## 2014-09-02 NOTE — ED Notes (Signed)
Pt states he had a blackhead under lip yesterday that he fooled with. Today swelling to lip down to throat. Pt says throat feels tight but is able to swallow saliva w/ no problems. Noted exp wheezing pt says that started a few days ago,

## 2014-09-02 NOTE — ED Notes (Signed)
Pt alert & oriented x4, stable gait. Patient given discharge instructions, paperwork & prescription(s). Patient  instructed to stop at the registration desk to finish any additional paperwork. Patient verbalized understanding. Pt left department w/ no further questions. 
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
## 2014-09-02 NOTE — Discharge Instructions (Signed)
As discussed, it is very important that you monitor your condition carefully, and do not hesitate to return here if you develop new, or concerning changes in your condition.

## 2014-11-21 ENCOUNTER — Encounter (HOSPITAL_COMMUNITY): Payer: Self-pay | Admitting: Emergency Medicine

## 2014-11-21 ENCOUNTER — Emergency Department (HOSPITAL_COMMUNITY)
Admission: EM | Admit: 2014-11-21 | Discharge: 2014-11-21 | Disposition: A | Discharge status: Home / Self Care | Payer: Medicare Other | Attending: Emergency Medicine | Admitting: Emergency Medicine

## 2014-11-21 DIAGNOSIS — F329 Major depressive disorder, single episode, unspecified: Secondary | ICD-10-CM | POA: Diagnosis not present

## 2014-11-21 DIAGNOSIS — M545 Low back pain, unspecified: Secondary | ICD-10-CM

## 2014-11-21 DIAGNOSIS — I10 Essential (primary) hypertension: Secondary | ICD-10-CM | POA: Diagnosis not present

## 2014-11-21 DIAGNOSIS — Z9981 Dependence on supplemental oxygen: Secondary | ICD-10-CM | POA: Diagnosis not present

## 2014-11-21 DIAGNOSIS — G8929 Other chronic pain: Secondary | ICD-10-CM | POA: Insufficient documentation

## 2014-11-21 DIAGNOSIS — Z720 Tobacco use: Secondary | ICD-10-CM | POA: Diagnosis not present

## 2014-11-21 DIAGNOSIS — Z79899 Other long term (current) drug therapy: Secondary | ICD-10-CM | POA: Insufficient documentation

## 2014-11-21 DIAGNOSIS — F419 Anxiety disorder, unspecified: Secondary | ICD-10-CM | POA: Insufficient documentation

## 2014-11-21 MED ORDER — OXYCODONE-ACETAMINOPHEN 5-325 MG PO TABS: 1.0000 | ORAL_TABLET | ORAL | Status: DC | PRN

## 2014-11-21 NOTE — ED Notes (Signed)
Pt states was rearranging furniture on Friday. States unable to get out of bed yesterday due to pain. Pt states pain to center lower back with hx of bulging discs. States he usually takes Percocet's for pain but ran out on Thursday. States he has an appt to see Dr. Hilda LiasKeeling tomorrow for refill. Pt is ambulatory with the use of a cane.
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
## 2014-11-21 NOTE — ED Notes (Addendum)
Patient complaining of back pain after moving a TV and a recliner Friday. States he has history of back pain. States he has appointment with Dr Hilda LiasKeeling tomorrow.

## 2014-11-21 NOTE — Discharge Instructions (Signed)

## 2014-12-30 ENCOUNTER — Institutional Professional Consult (permissible substitution): Payer: Medicare Other | Admitting: Internal Medicine

## 2015-01-23 IMAGING — CR DG CHEST 1V PORT
1 series · 1 of 1 positions shown · non-contrast
Comparison: 10/24/2013.

CLINICAL DATA: Intubation.

EXAM:
PORTABLE CHEST - 1 VIEW

[AP]
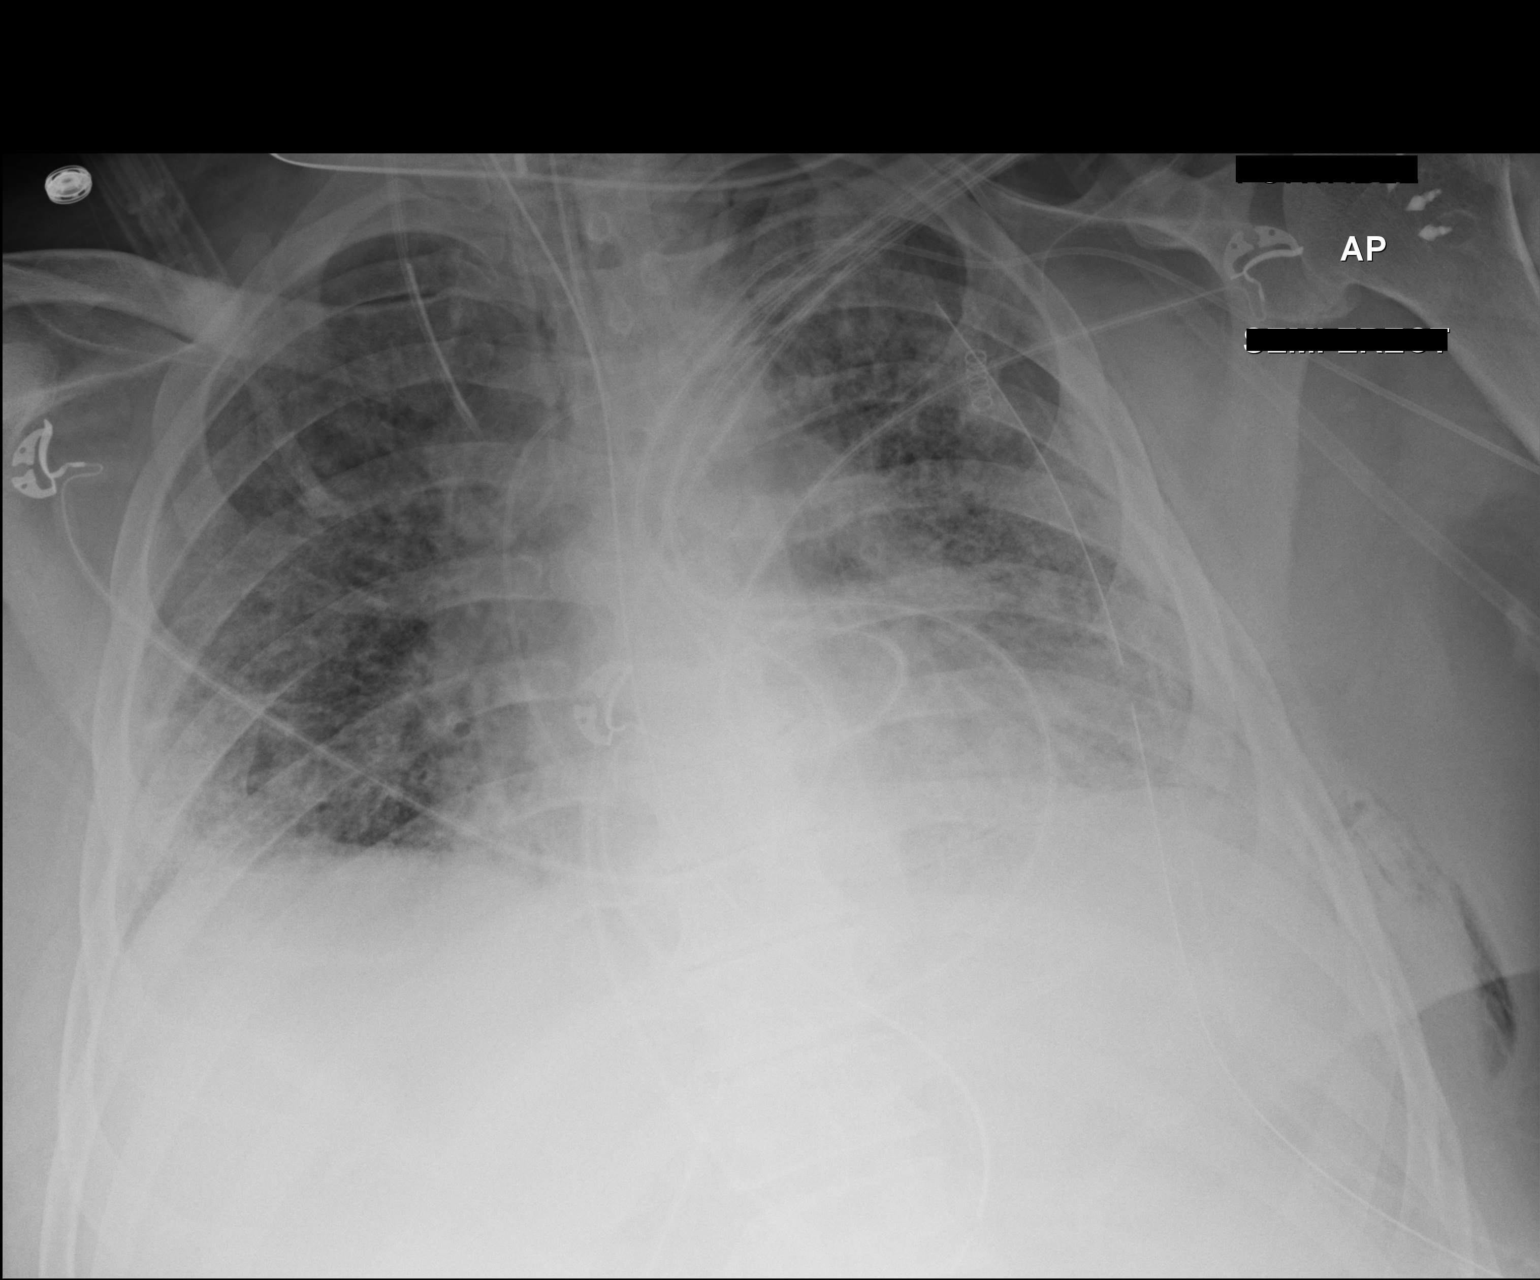

[1 of 1 positions shown; findings below may reference images not displayed]

FINDINGS: Endotracheal tube, NG tube, right IJ line, left PICC line in stable
position. Left chest tube in stable position. Tiny left apical
pneumothorax is noted. Mediastinum and hilar structures normal.
Heart size normal. Diffuse pulmonary interstitial infiltrates are
unchanged. Left chest wall subcutaneous emphysema unchanged.
IMPRESSION: 1. Support lines and tubes stable. Left chest tube in stable
position. Critical Value/emergent results were called by telephone
at the time of interpretation on 10/24/2013 at [DATE] to nurse
Karabona, who verbally acknowledged these results.
2. Tiny left apical pneumothorax. Left chest wall subcutaneous
emphysema.
3. Persistent bilateral interstitial prominence cyst with
interstitial edema and/or pneumonitis.

## 2015-02-05 IMAGING — CR DG CHEST 1V PORT
1 series · 1 of 1 positions shown · non-contrast
Comparison: 11/05/2013

CLINICAL DATA: Pneumothorax on the left

EXAM:
PORTABLE CHEST - 1 VIEW

[AP]
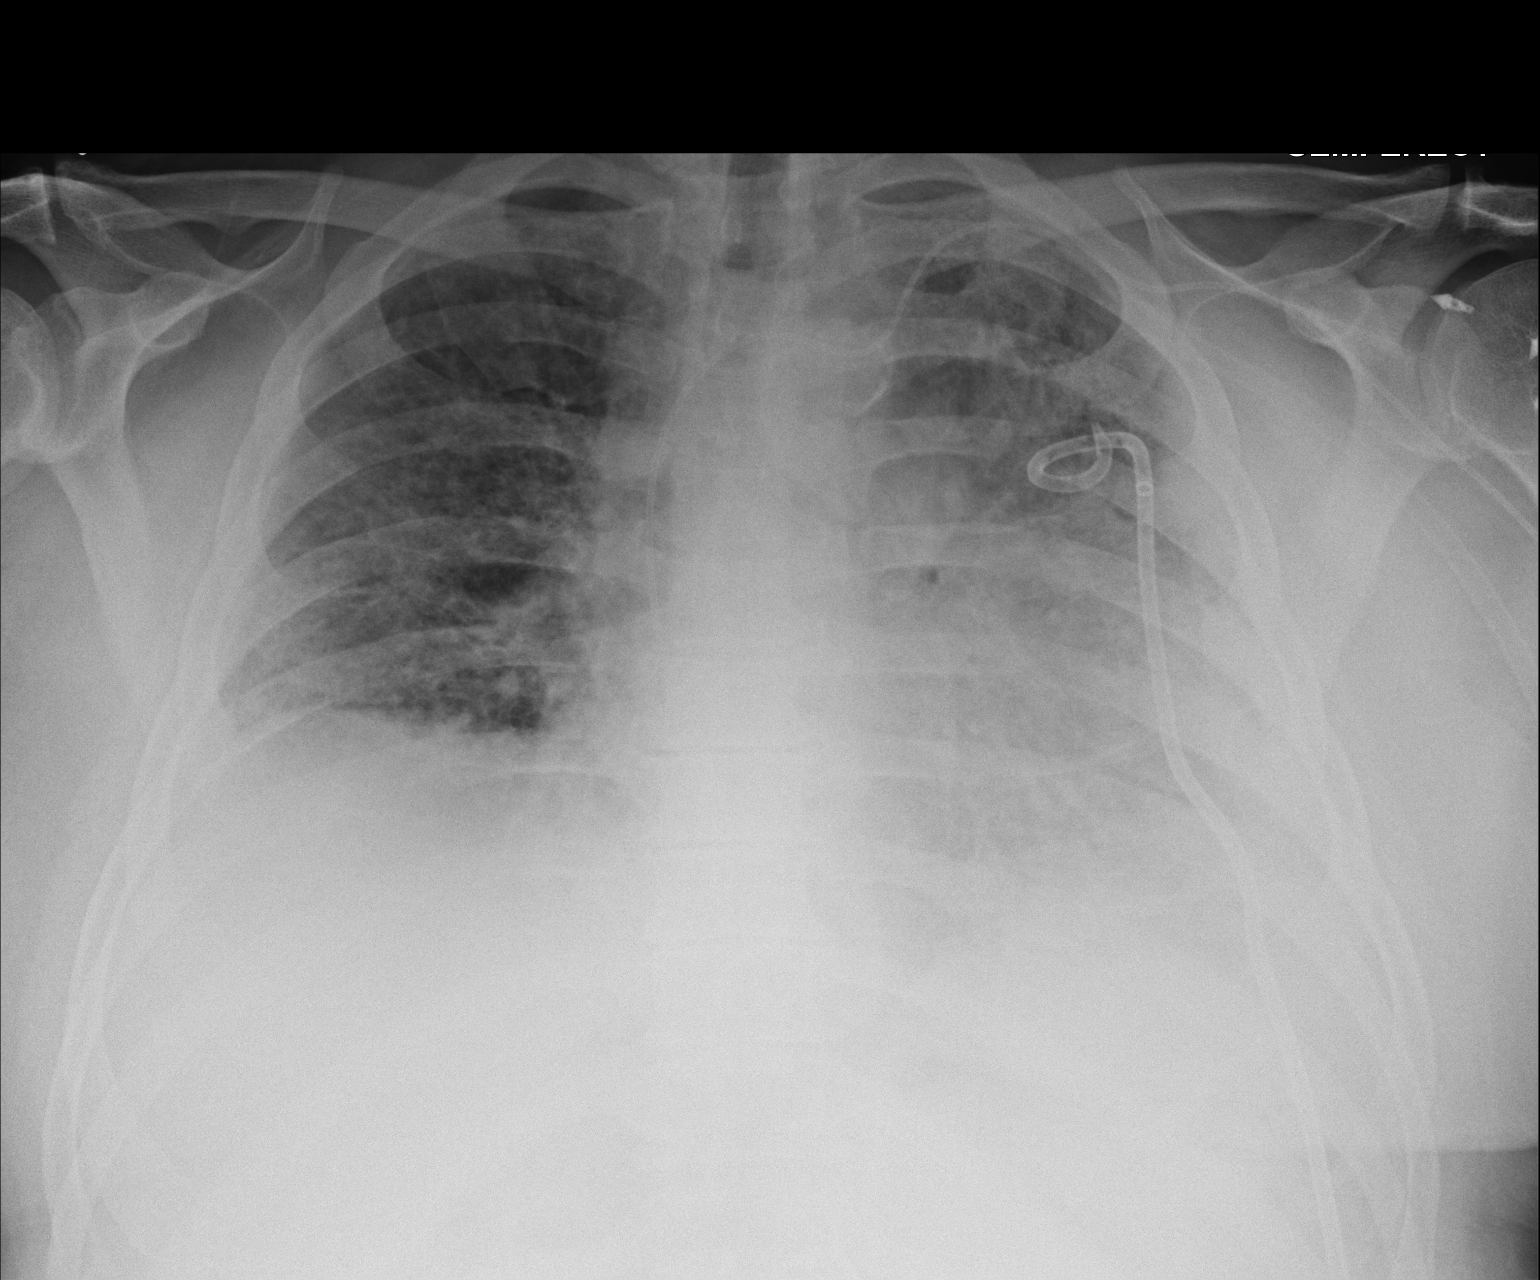

[1 of 1 positions shown; findings below may reference images not displayed]

FINDINGS: Left pleural pigtail catheter in unchanged position. No left
pneumothorax. Left lung interstitial and alveolar airspace opacities
with a small left pleural effusion. Mild right lung interstitial
thickening. Left-sided PICC line with the tip projecting over the
SVC. Stable cardiomediastinal silhouette. Unremarkable osseous
structures.
IMPRESSION: 1. Left-sided pigtail catheter in unchanged position without a
pneumothorax.
2. Stable bilateral airspace disease, left greater than right.

## 2015-02-06 IMAGING — CR DG CHEST 2V
2 series · 2 of 2 positions shown · non-contrast
Comparison: November 06, 2013

CLINICAL DATA: Persistent difficulty breathing

EXAM:
CHEST  2 VIEW

[w chest pa]
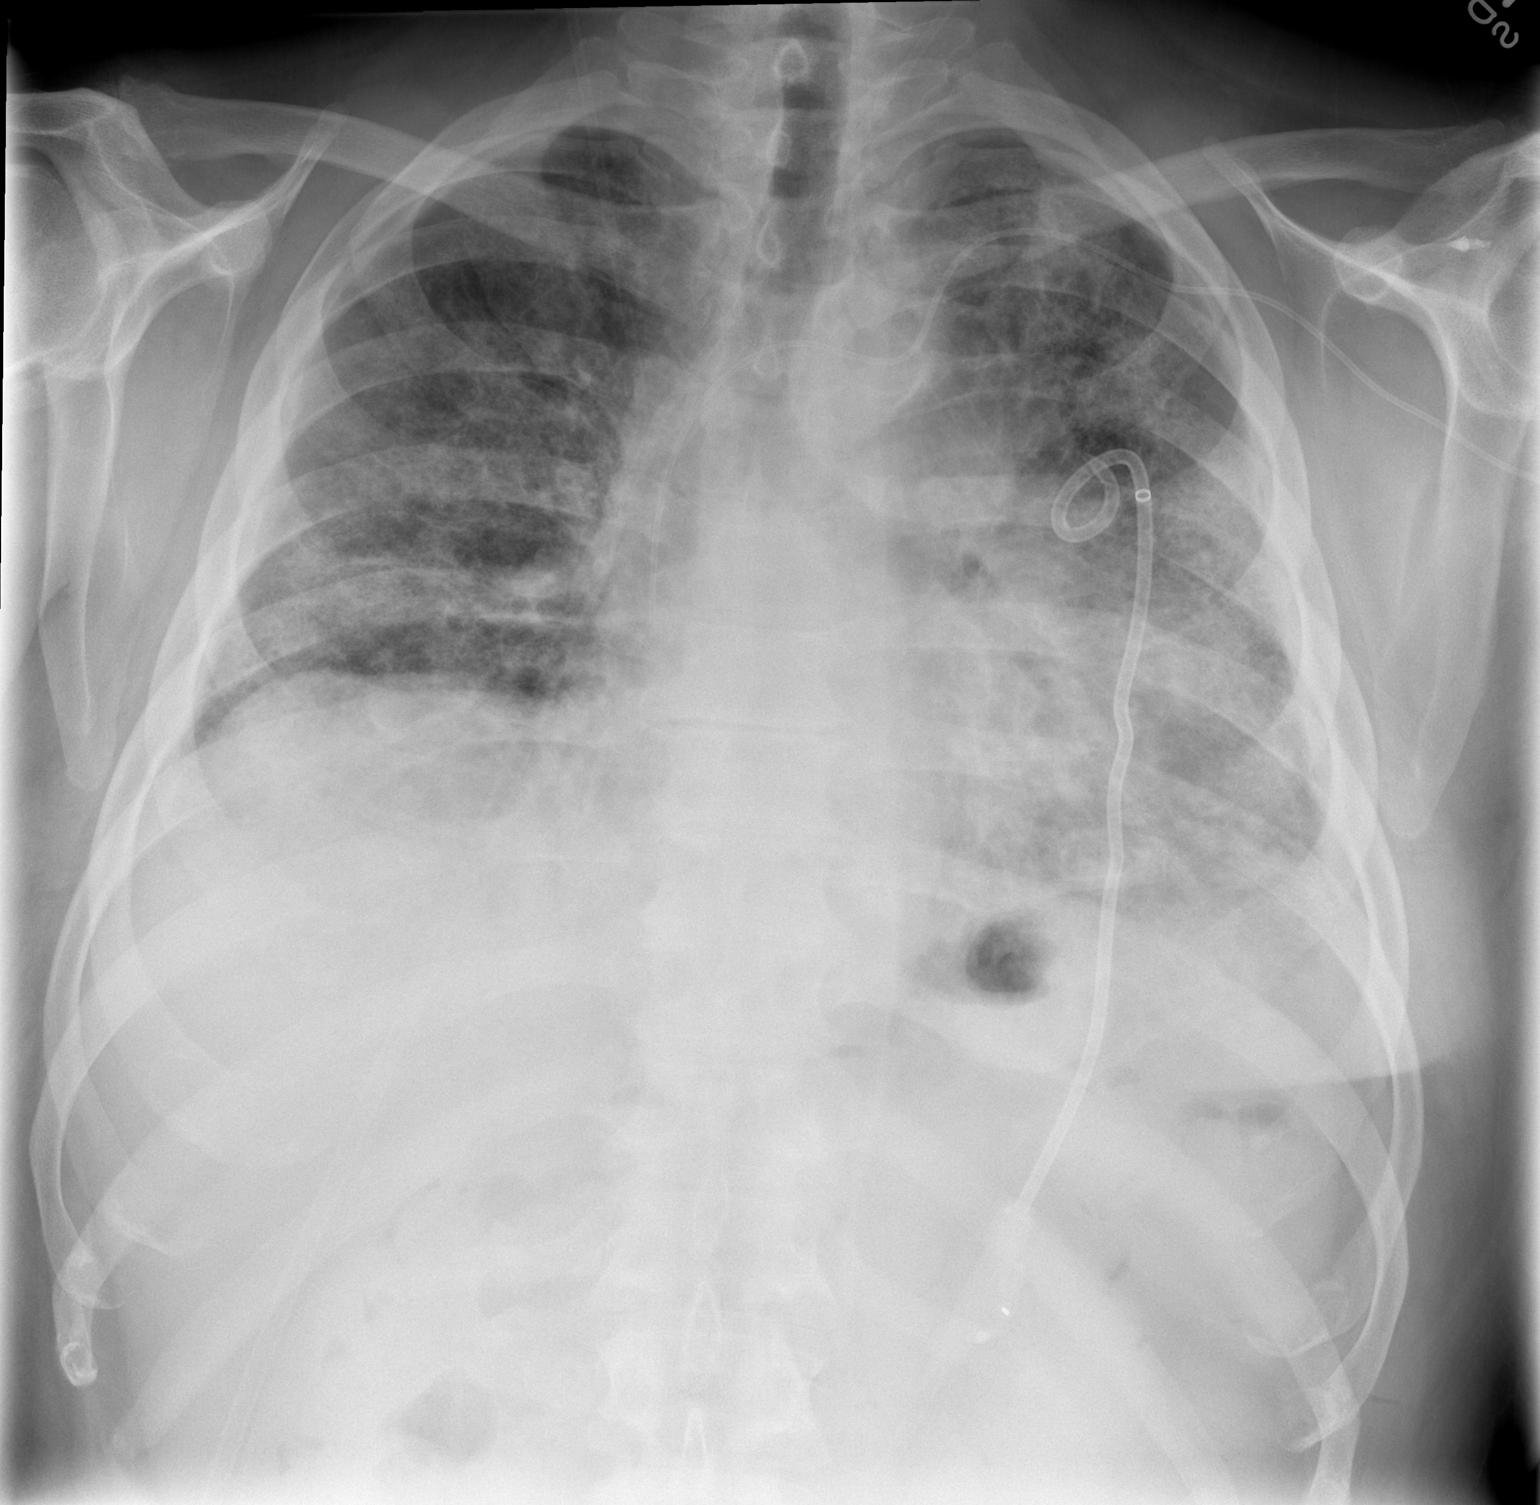

[w chest lat]
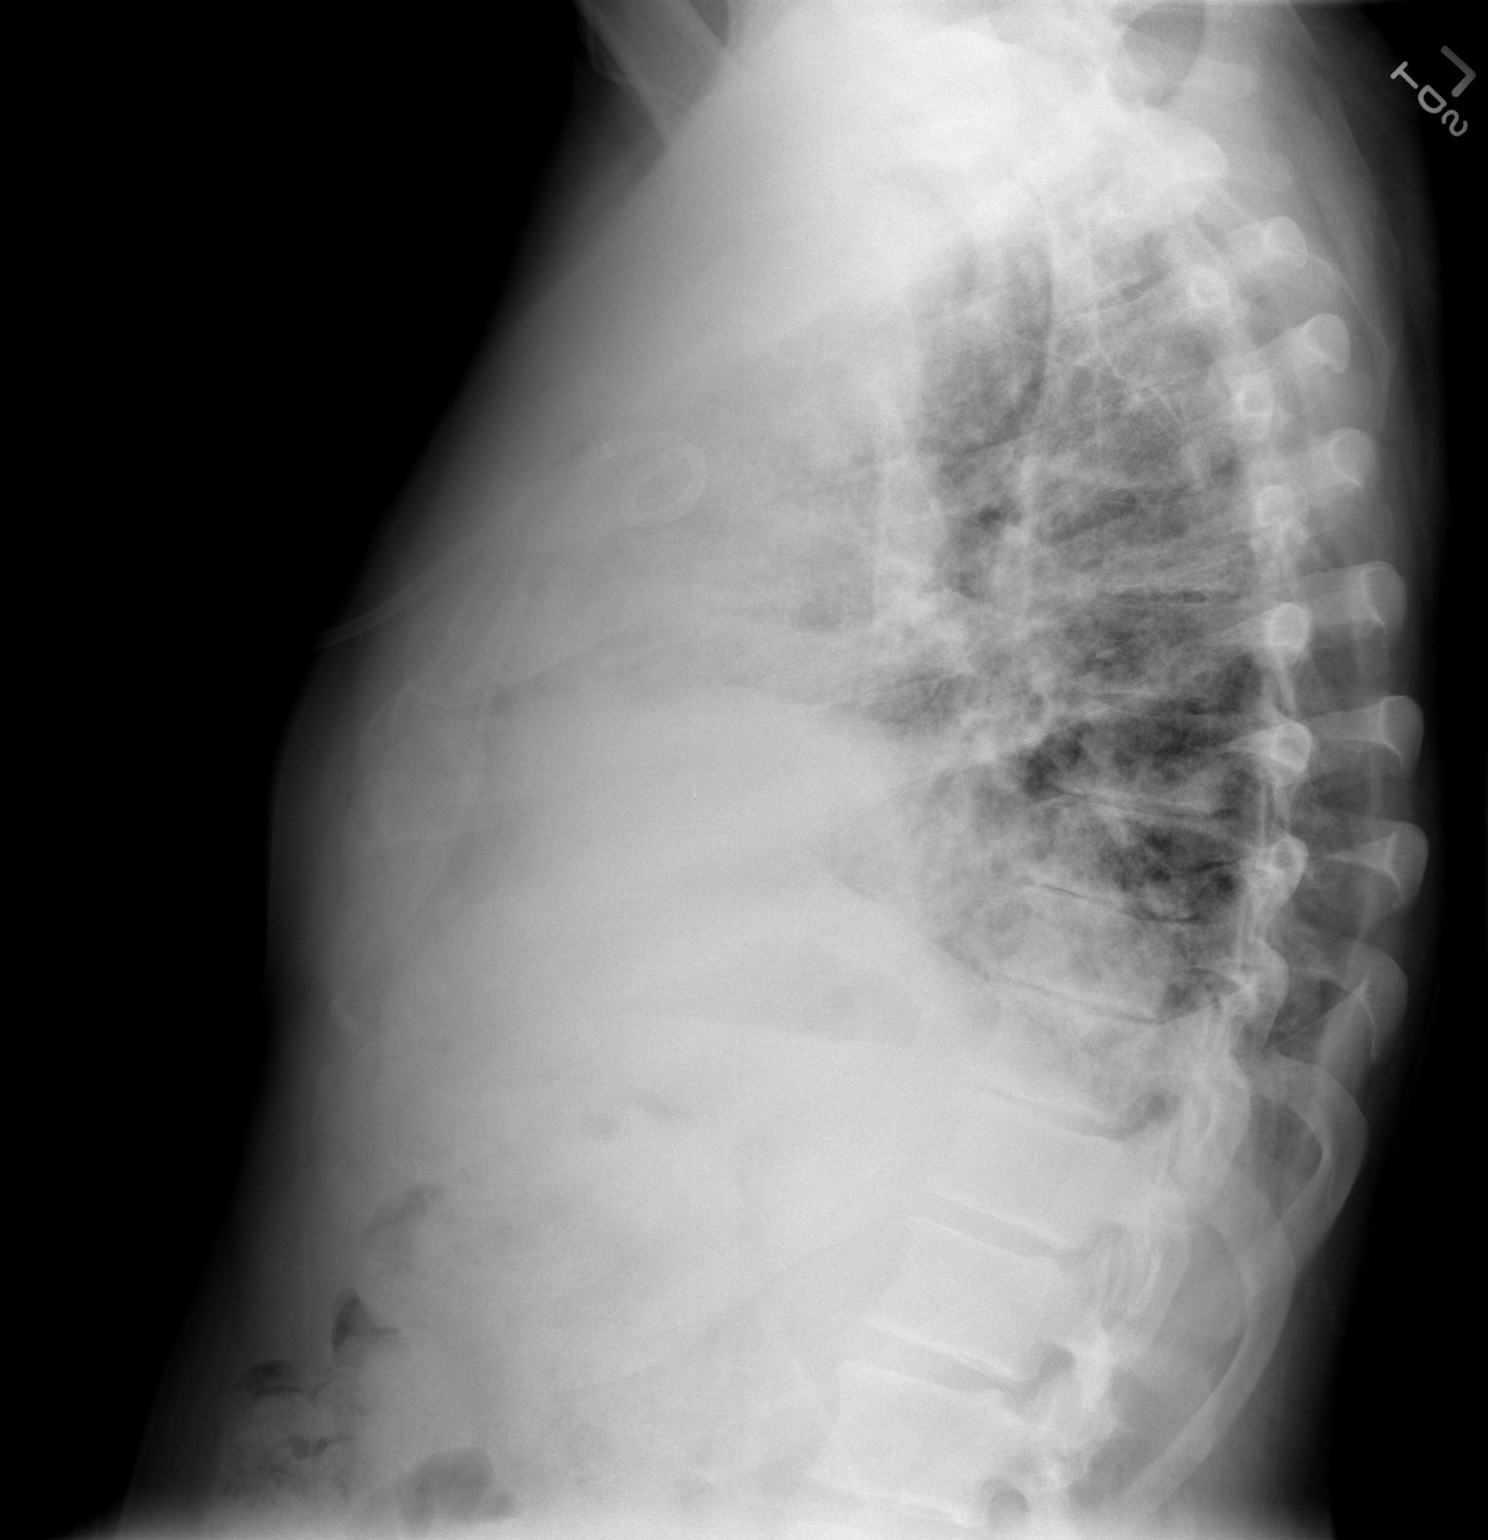

[2 of 2 positions shown; findings below may reference images not displayed]

FINDINGS: Pigtail catheter remains on the left unchanged. Central catheter tip
is at the cavoatrial junction. No pneumothorax. There is persistent
pulmonary fibrosis. There is no airspace consolidation or new
opacity. Heart is prominent but stable. Pulmonary vascularity is
within normal limits. No adenopathy. There is postoperative change
in the left shoulder.
IMPRESSION: Persistent pulmonary fibrosis without new opacity. No consolidation.
Stable cardiac prominence. Tube and catheter positions as described
without demonstrable pneumothorax.

## 2015-02-17 IMAGING — CR DG CHEST 2V
2 series · 2 of 2 positions shown · non-contrast
Comparison: 11/08/2013

CLINICAL DATA: Acute respiratory failure, shortness of breath,
cough, chest pain

EXAM:
CHEST - 2 VIEW

[w chest pa]
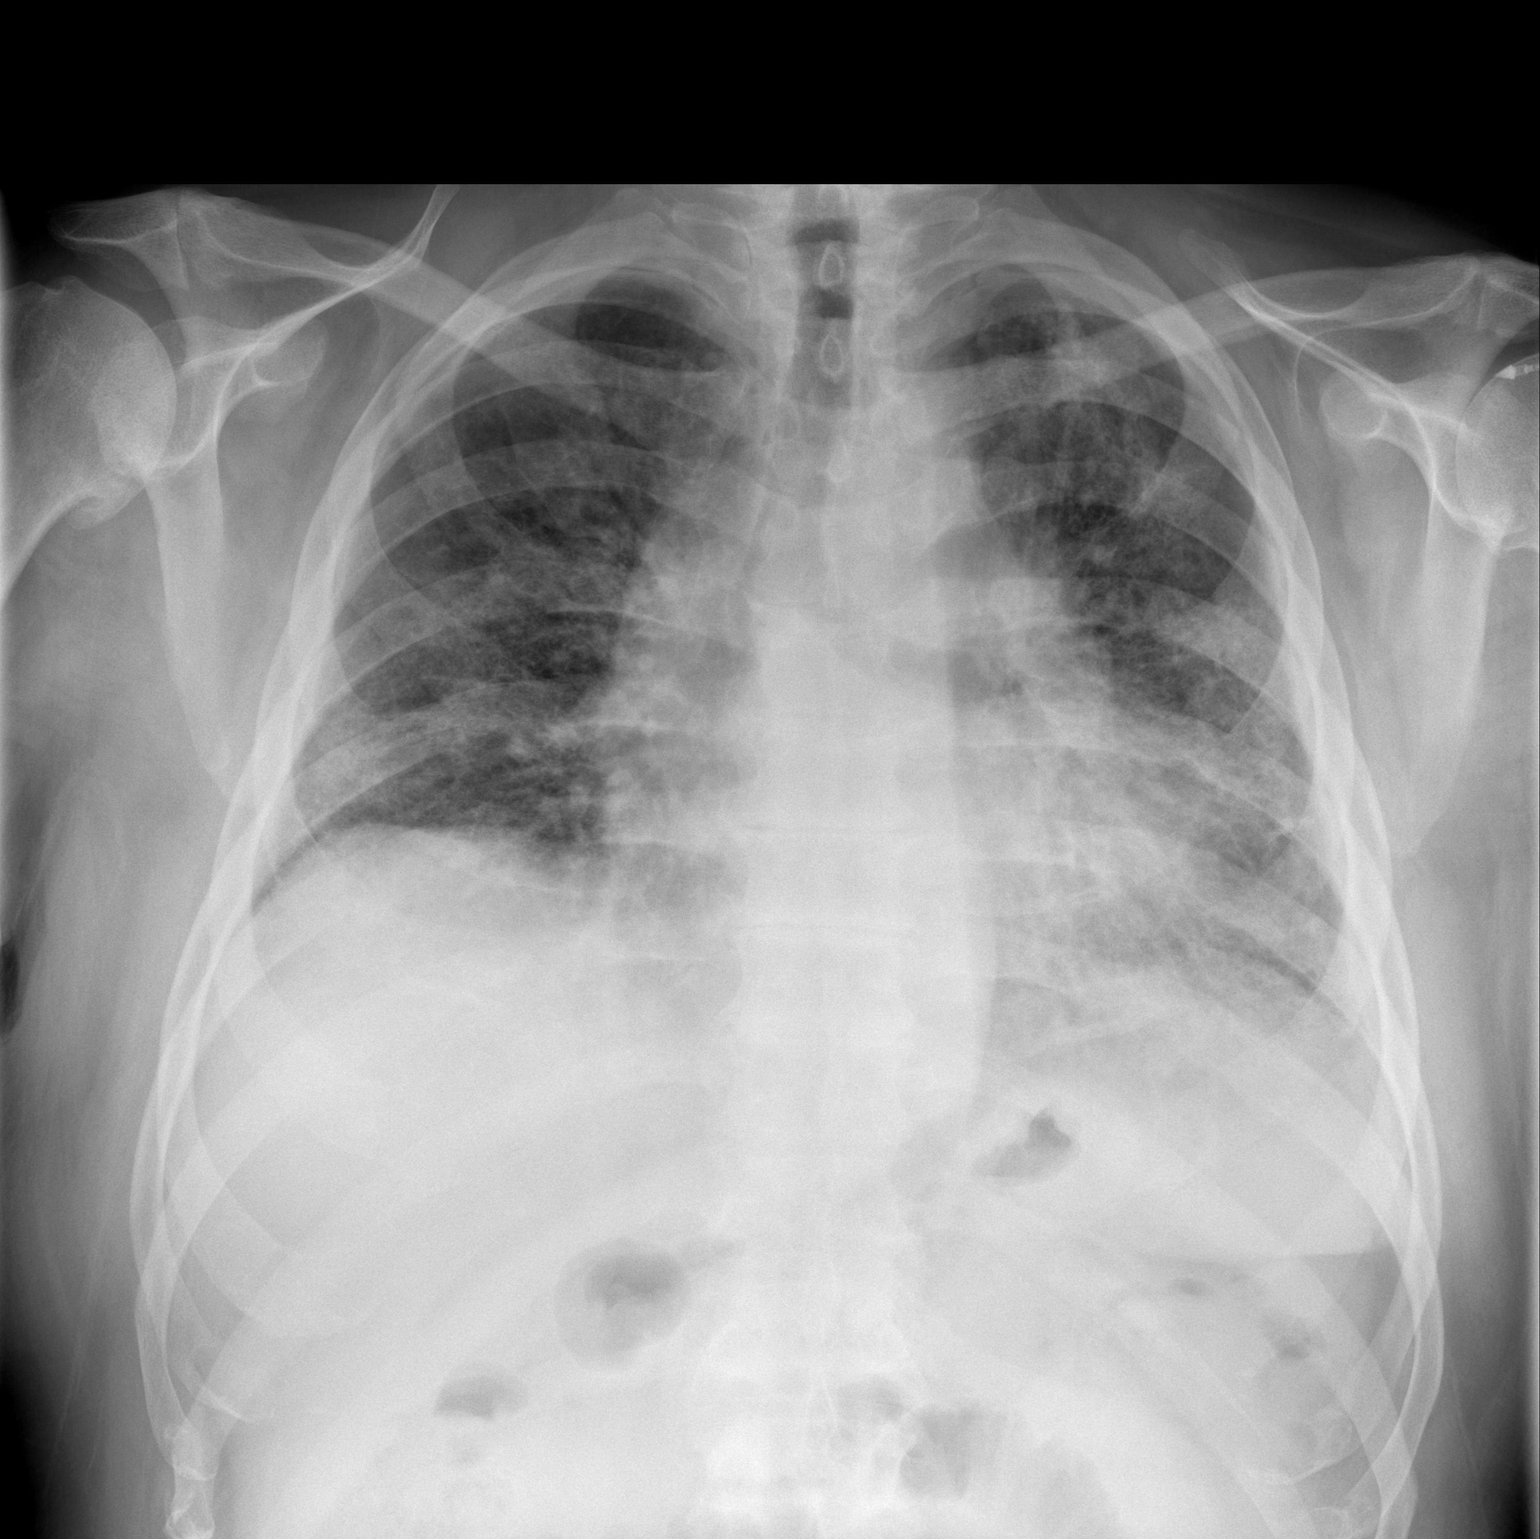

[w chest lat]
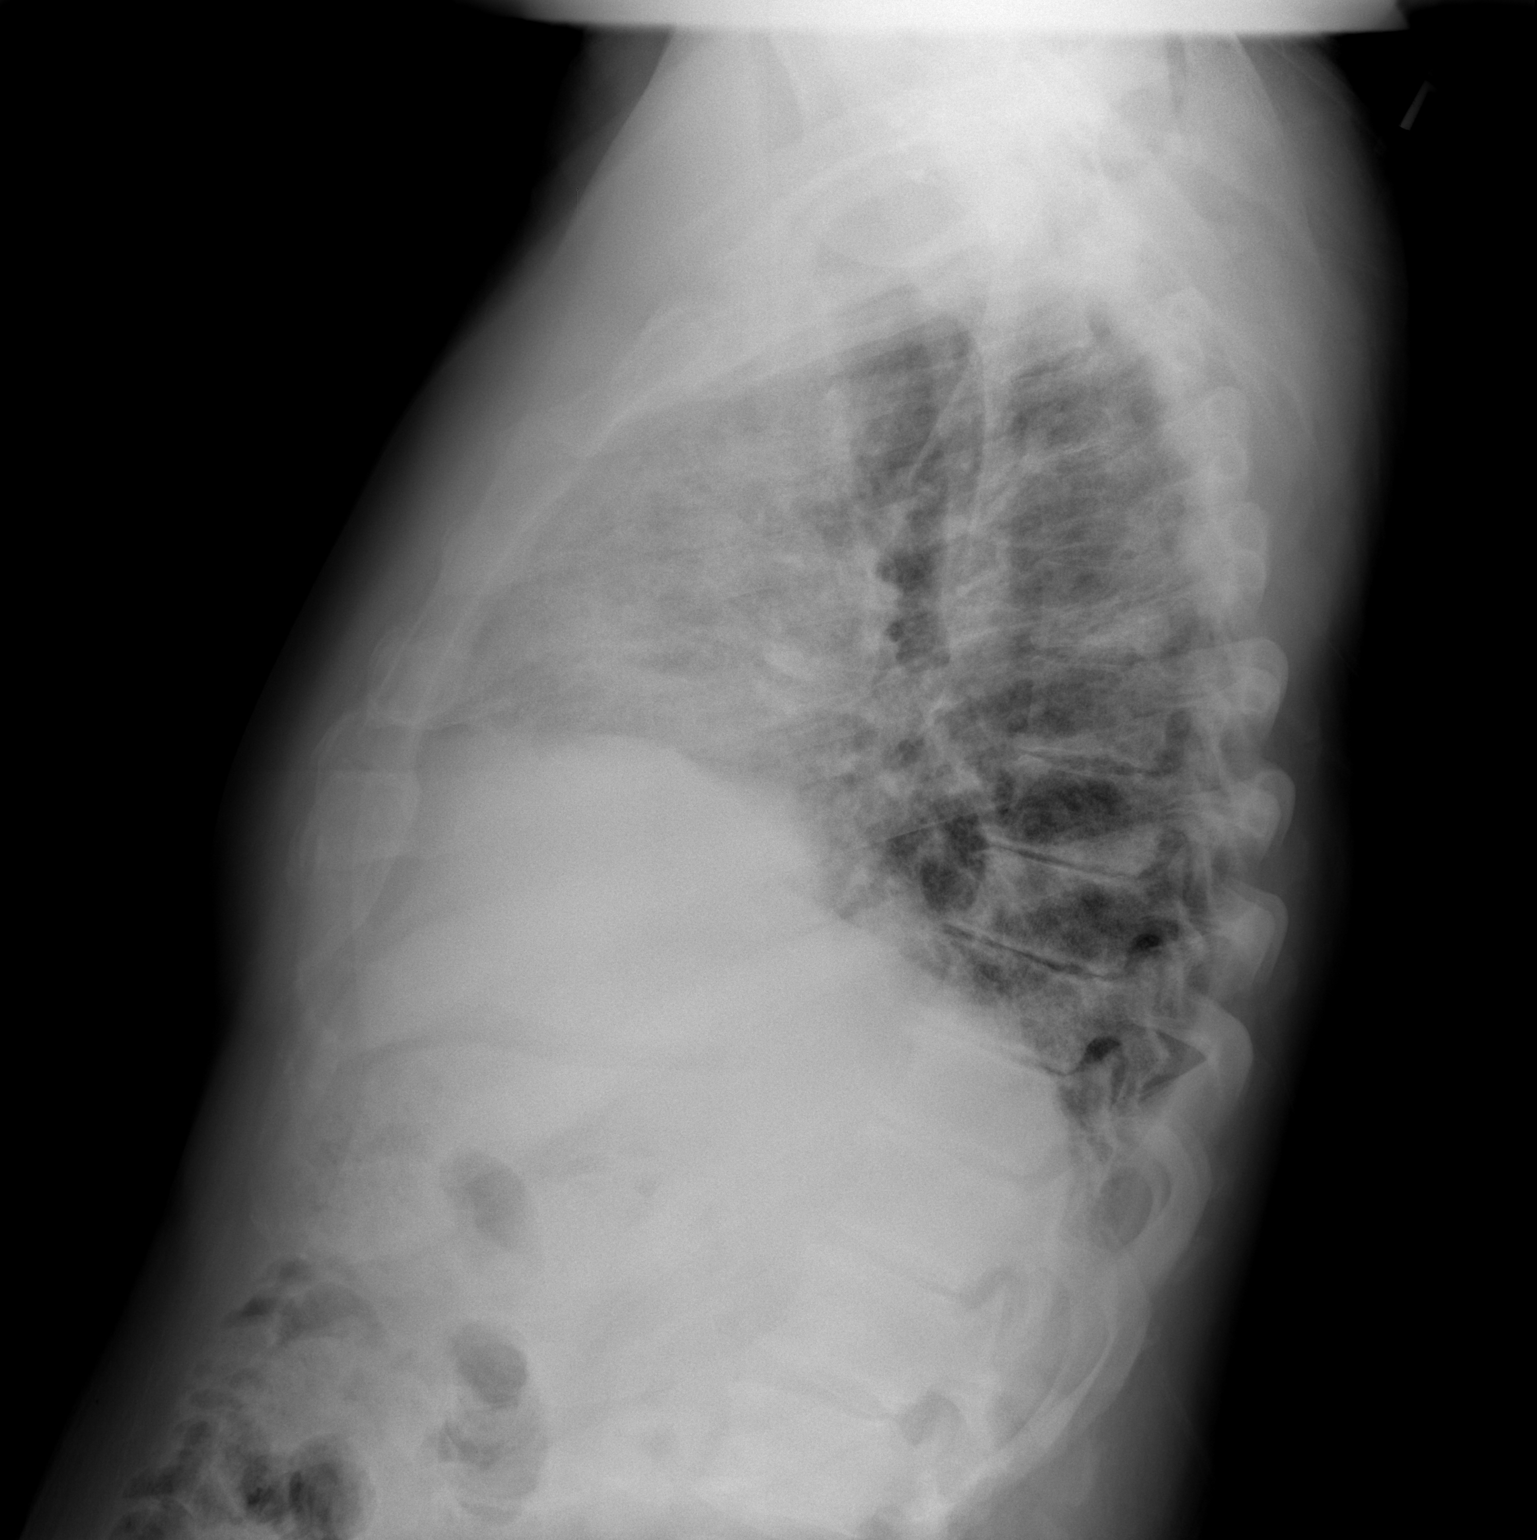

[2 of 2 positions shown; findings below may reference images not displayed]

FINDINGS: Interval left PICC line removal. Slight improvement of diffuse
bilateral airspace opacities compatible with resolving
pneumonia/ARDS. No effusion or significant pneumothorax. Trachea
midline. Stable mild cardiomegaly without superimposed edema. No
osseous abnormality.
IMPRESSION: Persistent low lung volumes with some interval improvement in
diffuse bilateral airspace process compatible with resolving
pneumonia versus ARDS.

## 2015-02-18 ENCOUNTER — Emergency Department (HOSPITAL_COMMUNITY): Payer: Medicare HMO

## 2015-02-18 ENCOUNTER — Inpatient Hospital Stay (HOSPITAL_COMMUNITY)
Admission: EM | Admit: 2015-02-18 | Discharge: 2015-02-22 | DRG: 190 | Disposition: A | Discharge status: Home / Self Care | Payer: Medicare HMO | Attending: Internal Medicine | Admitting: Internal Medicine

## 2015-02-18 ENCOUNTER — Encounter (HOSPITAL_COMMUNITY): Payer: Self-pay

## 2015-02-18 DIAGNOSIS — G4730 Sleep apnea, unspecified: Secondary | ICD-10-CM | POA: Diagnosis present

## 2015-02-18 DIAGNOSIS — I10 Essential (primary) hypertension: Secondary | ICD-10-CM | POA: Diagnosis present

## 2015-02-18 DIAGNOSIS — F32A Depression, unspecified: Secondary | ICD-10-CM | POA: Insufficient documentation

## 2015-02-18 DIAGNOSIS — F17210 Nicotine dependence, cigarettes, uncomplicated: Secondary | ICD-10-CM | POA: Diagnosis present

## 2015-02-18 DIAGNOSIS — G8929 Other chronic pain: Secondary | ICD-10-CM | POA: Insufficient documentation

## 2015-02-18 DIAGNOSIS — R6889 Other general symptoms and signs: Secondary | ICD-10-CM

## 2015-02-18 DIAGNOSIS — Z9119 Patient's noncompliance with other medical treatment and regimen: Secondary | ICD-10-CM

## 2015-02-18 DIAGNOSIS — Z6837 Body mass index (BMI) 37.0-37.9, adult: Secondary | ICD-10-CM

## 2015-02-18 DIAGNOSIS — J440 Chronic obstructive pulmonary disease with acute lower respiratory infection: Principal | ICD-10-CM | POA: Diagnosis present

## 2015-02-18 DIAGNOSIS — Z8249 Family history of ischemic heart disease and other diseases of the circulatory system: Secondary | ICD-10-CM | POA: Diagnosis not present

## 2015-02-18 DIAGNOSIS — Z833 Family history of diabetes mellitus: Secondary | ICD-10-CM

## 2015-02-18 DIAGNOSIS — E669 Obesity, unspecified: Secondary | ICD-10-CM | POA: Diagnosis present

## 2015-02-18 DIAGNOSIS — Z79891 Long term (current) use of opiate analgesic: Secondary | ICD-10-CM

## 2015-02-18 DIAGNOSIS — E860 Dehydration: Secondary | ICD-10-CM

## 2015-02-18 DIAGNOSIS — Z820 Family history of epilepsy and other diseases of the nervous system: Secondary | ICD-10-CM

## 2015-02-18 DIAGNOSIS — Z9981 Dependence on supplemental oxygen: Secondary | ICD-10-CM | POA: Diagnosis not present

## 2015-02-18 DIAGNOSIS — J9621 Acute and chronic respiratory failure with hypoxia: Secondary | ICD-10-CM | POA: Diagnosis present

## 2015-02-18 DIAGNOSIS — N179 Acute kidney failure, unspecified: Secondary | ICD-10-CM

## 2015-02-18 DIAGNOSIS — F4310 Post-traumatic stress disorder, unspecified: Secondary | ICD-10-CM | POA: Insufficient documentation

## 2015-02-18 DIAGNOSIS — Z23 Encounter for immunization: Secondary | ICD-10-CM

## 2015-02-18 DIAGNOSIS — M5430 Sciatica, unspecified side: Secondary | ICD-10-CM | POA: Insufficient documentation

## 2015-02-18 DIAGNOSIS — J189 Pneumonia, unspecified organism: Secondary | ICD-10-CM | POA: Diagnosis present

## 2015-02-18 DIAGNOSIS — F329 Major depressive disorder, single episode, unspecified: Secondary | ICD-10-CM | POA: Insufficient documentation

## 2015-02-18 DIAGNOSIS — R05 Cough: Secondary | ICD-10-CM | POA: Diagnosis not present

## 2015-02-18 DIAGNOSIS — J441 Chronic obstructive pulmonary disease with (acute) exacerbation: Secondary | ICD-10-CM | POA: Diagnosis present

## 2015-02-18 LAB — BASIC METABOLIC PANEL
Anion gap: 10 (ref 5–15)
BUN: 32 mg/dL — ABNORMAL HIGH (ref 6–20)
CO2: 29 mmol/L (ref 22–32)
Calcium: 8.9 mg/dL (ref 8.9–10.3)
Chloride: 102 mmol/L (ref 101–111)
Creatinine, Ser: 1.51 mg/dL — ABNORMAL HIGH (ref 0.61–1.24)
GFR calc Af Amer: 54 mL/min — ABNORMAL LOW (ref >60)
GFR calc non Af Amer: 47 mL/min — ABNORMAL LOW (ref >60)
Glucose, Bld: 118 mg/dL — ABNORMAL HIGH (ref 65–99)
Potassium: 4.5 mmol/L (ref 3.5–5.1)
Sodium: 141 mmol/L (ref 135–145)

## 2015-02-18 LAB — CBC WITH DIFFERENTIAL/PLATELET
Basophils Absolute: 0.1 10*3/uL (ref 0.0–0.1)
Basophils Relative: 1 %
Eosinophils Absolute: 0.3 10*3/uL (ref 0.0–0.7)
Eosinophils Relative: 3 %
HCT: 42 % (ref 39.0–52.0)
Hemoglobin: 13.9 g/dL (ref 13.0–17.0)
Lymphocytes Relative: 25 %
Lymphs Abs: 2.8 10*3/uL (ref 0.7–4.0)
MCH: 31.3 pg (ref 26.0–34.0)
MCHC: 33.1 g/dL (ref 30.0–36.0)
MCV: 94.6 fL (ref 78.0–100.0)
Monocytes Absolute: 1.4 10*3/uL — ABNORMAL HIGH (ref 0.1–1.0)
Monocytes Relative: 13 %
Neutro Abs: 6.5 10*3/uL (ref 1.7–7.7)
Neutrophils Relative %: 58 %
Platelets: 247 10*3/uL (ref 150–400)
RBC: 4.44 MIL/uL (ref 4.22–5.81)
RDW: 13.9 % (ref 11.5–15.5)
WBC: 11.1 10*3/uL — ABNORMAL HIGH (ref 4.0–10.5)

## 2015-02-18 LAB — INFLUENZA PANEL BY PCR (TYPE A & B)
H1N1 flu by pcr: NOT DETECTED
Influenza A By PCR: NEGATIVE
Influenza B By PCR: NEGATIVE

## 2015-02-18 LAB — BRAIN NATRIURETIC PEPTIDE: B Natriuretic Peptide: 16 pg/mL (ref 0.0–100.0)

## 2015-02-18 LAB — I-STAT CG4 LACTIC ACID, ED: Lactic Acid, Venous: 1.43 mmol/L (ref 0.5–2.0)

## 2015-02-18 MED ORDER — OXYCODONE-ACETAMINOPHEN 5-325 MG PO TABS
1.0000 | ORAL_TABLET | Freq: Once | ORAL | Status: AC
  Administered 2015-02-18: 1 via ORAL
  Filled 2015-02-18: qty 1

## 2015-02-18 MED ORDER — FAMOTIDINE 20 MG PO TABS
20.0000 mg | ORAL_TABLET | Freq: Every day | ORAL | Status: DC
  Administered 2015-02-18 – 2015-02-21 (×4): 20 mg via ORAL
  Filled 2015-02-18 (×5): qty 1

## 2015-02-18 MED ORDER — DEXTROSE 5 % IV SOLN
500.0000 mg | Freq: Once | INTRAVENOUS | Status: AC
  Administered 2015-02-18: 500 mg via INTRAVENOUS
  Filled 2015-02-18: qty 500

## 2015-02-18 MED ORDER — GABAPENTIN 400 MG PO CAPS
1200.0000 mg | ORAL_CAPSULE | Freq: Three times a day (TID) | ORAL | Status: DC
  Administered 2015-02-18 – 2015-02-22 (×11): 1200 mg via ORAL
  Filled 2015-02-18 (×12): qty 3

## 2015-02-18 MED ORDER — ESCITALOPRAM OXALATE 10 MG PO TABS
20.0000 mg | ORAL_TABLET | Freq: Two times a day (BID) | ORAL | Status: DC
  Administered 2015-02-18 – 2015-02-22 (×8): 20 mg via ORAL
  Filled 2015-02-18 (×10): qty 2

## 2015-02-18 MED ORDER — DIAZEPAM 5 MG PO TABS
5.0000 mg | ORAL_TABLET | Freq: Three times a day (TID) | ORAL | Status: DC
  Administered 2015-02-18 – 2015-02-20 (×5): 5 mg via ORAL
  Filled 2015-02-18 (×5): qty 1

## 2015-02-18 MED ORDER — ENOXAPARIN SODIUM 60 MG/0.6ML ~~LOC~~ SOLN
60.0000 mg | SUBCUTANEOUS | Status: DC
  Administered 2015-02-18 – 2015-02-21 (×4): 60 mg via SUBCUTANEOUS
  Filled 2015-02-18 (×4): qty 0.6

## 2015-02-18 MED ORDER — SODIUM CHLORIDE 0.9 % IV BOLUS (SEPSIS)
1000.0000 mL | Freq: Once | INTRAVENOUS | Status: AC
  Administered 2015-02-18: 1000 mL via INTRAVENOUS

## 2015-02-18 MED ORDER — SODIUM CHLORIDE 0.9 % IV SOLN
INTRAVENOUS | Status: DC
  Administered 2015-02-19 – 2015-02-20 (×3): via INTRAVENOUS

## 2015-02-18 MED ORDER — ZOLPIDEM TARTRATE 5 MG PO TABS
5.0000 mg | ORAL_TABLET | Freq: Every evening | ORAL | Status: DC | PRN
  Administered 2015-02-21: 5 mg via ORAL
  Filled 2015-02-18: qty 1

## 2015-02-18 MED ORDER — SODIUM CHLORIDE 0.9 % IV SOLN
INTRAVENOUS | Status: DC
  Administered 2015-02-18: 20:00:00 via INTRAVENOUS

## 2015-02-18 MED ORDER — MELATONIN 5 MG PO TABS: 5.0000 mg | ORAL_TABLET | Freq: Every day | ORAL | Status: DC

## 2015-02-18 MED ORDER — DEXTROSE 5 % IV SOLN
500.0000 mg | INTRAVENOUS | Status: DC
  Administered 2015-02-19 – 2015-02-21 (×3): 500 mg via INTRAVENOUS
  Filled 2015-02-18 (×4): qty 500

## 2015-02-18 MED ORDER — DM-GUAIFENESIN ER 30-600 MG PO TB12
1.0000 | ORAL_TABLET | Freq: Two times a day (BID) | ORAL | Status: DC
  Administered 2015-02-18 – 2015-02-22 (×8): 1 via ORAL
  Filled 2015-02-18 (×9): qty 1

## 2015-02-18 MED ORDER — DEXTROSE 5 % IV SOLN
1.0000 g | INTRAVENOUS | Status: DC
  Administered 2015-02-19 – 2015-02-21 (×3): 1 g via INTRAVENOUS
  Filled 2015-02-18 (×4): qty 10

## 2015-02-18 MED ORDER — METHYLPREDNISOLONE SODIUM SUCC 125 MG IJ SOLR
125.0000 mg | Freq: Once | INTRAMUSCULAR | Status: AC
  Administered 2015-02-18: 125 mg via INTRAVENOUS
  Filled 2015-02-18: qty 2

## 2015-02-18 MED ORDER — INFLUENZA VAC SPLIT QUAD 0.5 ML IM SUSY
0.5000 mL | PREFILLED_SYRINGE | INTRAMUSCULAR | Status: AC
  Administered 2015-02-19: 0.5 mL via INTRAMUSCULAR
  Filled 2015-02-18: qty 0.5

## 2015-02-18 MED ORDER — IPRATROPIUM-ALBUTEROL 0.5-2.5 (3) MG/3ML IN SOLN
3.0000 mL | Freq: Once | RESPIRATORY_TRACT | Status: AC
  Administered 2015-02-18: 3 mL via RESPIRATORY_TRACT
  Filled 2015-02-18: qty 3

## 2015-02-18 MED ORDER — IPRATROPIUM-ALBUTEROL 0.5-2.5 (3) MG/3ML IN SOLN
3.0000 mL | Freq: Four times a day (QID) | RESPIRATORY_TRACT | Status: DC
  Administered 2015-02-19 – 2015-02-22 (×13): 3 mL via RESPIRATORY_TRACT
  Filled 2015-02-18 (×13): qty 3

## 2015-02-18 MED ORDER — SODIUM CHLORIDE 0.9 % IV SOLN: INTRAVENOUS | Status: DC

## 2015-02-18 MED ORDER — DEXTROSE 5 % IV SOLN
1.0000 g | Freq: Once | INTRAVENOUS | Status: AC
  Administered 2015-02-18: 1 g via INTRAVENOUS
  Filled 2015-02-18: qty 10

## 2015-02-18 MED ORDER — ALBUTEROL SULFATE (2.5 MG/3ML) 0.083% IN NEBU: 3.0000 mL | INHALATION_SOLUTION | RESPIRATORY_TRACT | Status: DC | PRN

## 2015-02-18 MED ORDER — PANTOPRAZOLE SODIUM 40 MG PO TBEC
40.0000 mg | DELAYED_RELEASE_TABLET | Freq: Every day | ORAL | Status: DC
  Administered 2015-02-19 – 2015-02-22 (×4): 40 mg via ORAL
  Filled 2015-02-18 (×4): qty 1

## 2015-02-18 MED ORDER — METHYLPREDNISOLONE SODIUM SUCC 125 MG IJ SOLR
60.0000 mg | Freq: Two times a day (BID) | INTRAMUSCULAR | Status: DC
  Administered 2015-02-19 – 2015-02-22 (×7): 60 mg via INTRAVENOUS
  Filled 2015-02-18 (×7): qty 2

## 2015-02-18 MED ORDER — SUCRALFATE 1 G PO TABS
1.0000 g | ORAL_TABLET | Freq: Three times a day (TID) | ORAL | Status: DC
  Administered 2015-02-19 – 2015-02-22 (×10): 1 g via ORAL
  Filled 2015-02-18 (×10): qty 1

## 2015-02-18 MED ORDER — ALBUTEROL SULFATE (2.5 MG/3ML) 0.083% IN NEBU: 2.5000 mg | INHALATION_SOLUTION | RESPIRATORY_TRACT | Status: DC | PRN

## 2015-02-18 MED ORDER — OXYCODONE-ACETAMINOPHEN 5-325 MG PO TABS
1.0000 | ORAL_TABLET | ORAL | Status: DC | PRN
  Administered 2015-02-18 – 2015-02-22 (×18): 1 via ORAL
  Filled 2015-02-18 (×18): qty 1
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
## 2015-02-18 NOTE — ED Notes (Signed)
Pt reports coughing until he vomits x 3 days.  Pt says he coughs "violently."  Reports has had pneumonia several times in the past year.  Pt reports fever yesterday.

## 2015-02-18 NOTE — Progress Notes (Signed)
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
## 2015-02-18 NOTE — ED Notes (Signed)
Spoke with Dr. Deretha Emory about lung sounds and chest xray to determine if liter bolus still needed.  Dr gave verbal order to continue bolus

## 2015-02-19 DIAGNOSIS — J440 Chronic obstructive pulmonary disease with acute lower respiratory infection: Secondary | ICD-10-CM | POA: Diagnosis not present

## 2015-02-19 LAB — CBC
HCT: 41.4 % (ref 39.0–52.0)
Hemoglobin: 13.7 g/dL (ref 13.0–17.0)
MCH: 31.1 pg (ref 26.0–34.0)
MCHC: 33.1 g/dL (ref 30.0–36.0)
MCV: 94.1 fL (ref 78.0–100.0)
Platelets: 241 10*3/uL (ref 150–400)
RBC: 4.4 MIL/uL (ref 4.22–5.81)
RDW: 13.5 % (ref 11.5–15.5)
WBC: 9.2 10*3/uL (ref 4.0–10.5)

## 2015-02-19 LAB — BASIC METABOLIC PANEL
Anion gap: 7 (ref 5–15)
BUN: 29 mg/dL — ABNORMAL HIGH (ref 6–20)
CO2: 25 mmol/L (ref 22–32)
Calcium: 8.5 mg/dL — ABNORMAL LOW (ref 8.9–10.3)
Chloride: 108 mmol/L (ref 101–111)
Creatinine, Ser: 1.12 mg/dL (ref 0.61–1.24)
GFR calc Af Amer: >60 mL/min (ref >60)
GFR calc non Af Amer: >60 mL/min (ref >60)
Glucose, Bld: 175 mg/dL — ABNORMAL HIGH (ref 65–99)
Potassium: 4.5 mmol/L (ref 3.5–5.1)
Sodium: 140 mmol/L (ref 135–145)

## 2015-02-19 LAB — STREP PNEUMONIAE URINARY ANTIGEN: Strep Pneumo Urinary Antigen: NEGATIVE

## 2015-02-19 LAB — MRSA PCR SCREENING: MRSA by PCR: NEGATIVE
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
## 2015-02-19 NOTE — Progress Notes (Addendum)
Transferred to room 332, reported to R.Brower, RN, condition stable on oxygen at 4L/[place].

## 2015-02-19 NOTE — Progress Notes (Signed)
ANTIBIOTIC CONSULT NOTE - INITIAL  Pharmacy Consult for Renal Adjustment Antibiotics Indication: pneumonia  Allergies  Allergen Reactions  . Flexeril [Cyclobenzaprine Hcl] Hives  . Other Rash    Raw tobacco    Patient Measurements: Height:  (177.8 cm) Weight: 271 lb 9.7 oz (123.2 kg) IBW/kg (Calculated) : 73 Adjusted Body Weight:   Vital Signs: Temp: 96.8 F (36 C) (01/21 0400) Temp Source: Oral (01/21 0400) BP: 97/67 mmHg (01/21 0600) Pulse Rate: 60 (01/21 0600) Intake/Output from previous day: 01/20 0701 - 01/21 0700 In: 2100 [I.V.:1187.5; IV Piggyback:912.5] Out: 800 [Urine:800] Intake/Output from this shift:    Labs:  Recent Labs  02/18/15 1523 02/19/15 0421  WBC 11.1* 9.2  HGB 13.9 13.7  PLT 247 241  CREATININE 1.51* 1.12   Estimated Creatinine Clearance: 86.6 mL/min (by C-G formula based on Cr of 1.12). No results for input(s): VANCOTROUGH, VANCOPEAK, VANCORANDOM, GENTTROUGH, GENTPEAK, GENTRANDOM, TOBRATROUGH, TOBRAPEAK, TOBRARND, AMIKACINPEAK, AMIKACINTROU, AMIKACIN in the last 72 hours.   Microbiology: Recent Results (from the past 720 hour(s))  Culture, blood (Routine X 2) w Reflex to ID Panel     Status: None (Preliminary result)   Collection Time: 02/18/15  8:15 PM  Result Value Ref Range Status   Specimen Description BLOOD RIGHT HAND  Final   Special Requests BOTTLES DRAWN AEROBIC AND ANAEROBIC 4CC EACH  Final   Culture NO GROWTH < 12 HOURS  Final   Report Status PENDING  Incomplete  Culture, blood (Routine X 2) w Reflex to ID Panel     Status: None (Preliminary result)   Collection Time: 02/18/15  8:29 PM  Result Value Ref Range Status   Specimen Description BLOOD RIGHT HAND  Final   Special Requests BOTTLES DRAWN AEROBIC ONLY 4CC ONLY  Final   Culture NO GROWTH < 12 HOURS  Final   Report Status PENDING  Incomplete  MRSA PCR Screening     Status: None   Collection Time: 02/18/15  9:10 PM  Result Value Ref Range Status   MRSA by PCR  NEGATIVE NEGATIVE Final    Comment:        The GeneXpert MRSA Assay (FDA approved for NASAL specimens only), is one component of a comprehensive MRSA colonization surveillance program. It is not intended to diagnose MRSA infection nor to guide or monitor treatment for MRSA infections.     Medical History: Past Medical History  Diagnosis Date  . Sciatica   . Bulging discs     neck  . Torn rotator cuff   . Anxiety   . Sleep apnea     supposed to wear CPAP but doesn't  . Syncopal episodes     pt states "it happens after i try to get up after smoking a cigarette"  . Chronic pain   . On home O2     3L N/C  . Hypertension   . PTSD (post-traumatic stress disorder)   . Depression     Medications:  Scheduled:  . azithromycin  500 mg Intravenous Q24H  . cefTRIAXone (ROCEPHIN)  IV  1 g Intravenous Q24H  . dextromethorphan-guaiFENesin  1 tablet Oral BID  . diazepam  5 mg Oral TID  . enoxaparin (LOVENOX) injection  60 mg Subcutaneous Q24H  . escitalopram  20 mg Oral BID  . famotidine  20 mg Oral QHS  . gabapentin  1,200 mg Oral TID  . Influenza vac split quadrivalent PF  0.5 mL Intramuscular Tomorrow-1000  . ipratropium-albuterol  3 mL Nebulization Q6H  .  methylPREDNISolone (SOLU-MEDROL) injection  60 mg Intravenous Q12H  . pantoprazole  40 mg Oral Daily  . sucralfate  1 g Oral TID WC   Assessment: 65 yo male admitted from ED possible CAP Ceftriaxone and Azithromycin IV started in ED No renal adjustment necessary for Ceftriaxone and Azithromycin  Goal of Therapy:  Eradicate infection  Plan:  Continue Azithromycin 500 mg IV every 24 hours Continue Ceftriaxone 1 GM IV every 24 hours Monitor if therapy changes for necessary renal adjustment   Raquel James, Kandi Brusseau Bennett 02/19/2015,7:43 AM

## 2015-02-20 LAB — HIV ANTIBODY (ROUTINE TESTING W REFLEX): HIV Screen 4th Generation wRfx: NONREACTIVE

## 2015-02-20 MED ORDER — DIAZEPAM 5 MG PO TABS
10.0000 mg | ORAL_TABLET | Freq: Three times a day (TID) | ORAL | Status: DC
  Administered 2015-02-20 – 2015-02-22 (×6): 10 mg via ORAL
  Filled 2015-02-20 (×7): qty 2
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
## 2015-02-21 LAB — LEGIONELLA ANTIGEN, URINE
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
## 2015-02-22 MED ORDER — AZITHROMYCIN 500 MG PO TABS: 500.0000 mg | ORAL_TABLET | Freq: Every day | ORAL | Status: DC

## 2015-02-22 MED ORDER — OXYCODONE-ACETAMINOPHEN 5-325 MG PO TABS: 1.0000 | ORAL_TABLET | ORAL | Status: DC | PRN

## 2015-02-22 MED ORDER — DIAZEPAM 10 MG PO TABS: 10.0000 mg | ORAL_TABLET | Freq: Three times a day (TID) | ORAL | Status: DC

## 2015-02-22 MED ORDER — PREDNISONE 20 MG PO TABS: 20.0000 mg | ORAL_TABLET | Freq: Two times a day (BID) | ORAL | Status: DC

## 2015-02-22 MED ORDER — DEXTROSE 5 % IV SOLN: 500.0000 mg | INTRAVENOUS | Status: DC

## 2015-02-22 NOTE — Care Management Note (Signed)
Case Management Note  Patient Details  Name: Max [name] MRN: [id number] Date of Birth: May 10, 1950  Subjective/Objective:                 Spoke with patient for discharge planning. Patient is from home with spouse. Has home O2 with [facility]. Patient stated that he has a CNA that comes to the home 5 days a week. Patient alert oriented and sitting on the side of bed during discussion. No CM needs identified.   Action/Plan:   Home with self care.   Expected Discharge Date:                  Expected Discharge Plan:  Home/Self Care  In-House Referral:     Discharge planning Services  CM Consult  Post Acute Care Choice:    Choice offered to:     DME Arranged:  N/A DME Agency:  NA  HH Arranged:  NA HH Agency:     Status of Service:  Completed, signed off  Medicare Important Message Given:    Date Medicare IM Given:    Medicare IM give by:    Date Additional Medicare IM Given:    Additional Medicare Important Message give by:     If discussed at Long Length of Stay Meetings, dates discussed:    Additional Comments:  Adonis Huguenin, RN 02/22/2015, 11:27 AM

## 2015-02-22 NOTE — Progress Notes (Signed)
AVS reviewed with patient.  Patient verbalized understanding of discharge instructions, physician follow-up, and medications.  Two prescriptions given to patient and two were sent to pharmacy by Dr. Conley Rolls.  Patient reports having O2 at home and only uses as needed during the day and 4L at night.  Patient's O2 sats on RA 92%.  States he can get a tank for the ride home. Patient's IV removed.  Site bleeding after removal, pressure applied and bleeding stopped.  Patient transported by NT via wheelchair to main entrance for discharge.  Patient stable at time of discharge.

## 2015-02-22 NOTE — Discharge Summary (Addendum)
Physician Discharge Summary  Max [name] BMW:[id number] DOB: 15-Mar-1950 DOA: 02/18/2015  PCP: Dorrene German, MD  Admit date: 02/18/2015 Discharge date: 02/22/2015  Time spent: 35 minutes  Recommendations for Outpatient Follow-up:  1. Follow up with your PCP next week.    Discharge Diagnoses:  Active Problems:   Acute on chronic respiratory failure with hypoxia (HCC)   CAP (community acquired pneumonia)   Acute renal injury (HCC)   COPD exacerbation (HCC)   Discharge Condition: improved.  No SOB.  Minimal coughs.   Diet recommendation: Cardiac.   Filed Weights   02/18/15 1439 02/18/15 2114 02/19/15 0400  Weight: 118.842 kg (262 lb) 120 kg (264 lb 8.8 oz) 123.2 kg (271 lb 9.7 oz)    History of present illness: Patient was admitted for COPD exacerbation and bronchitis by Dr Adrian Blackwater on Feb 18, 2015.  As per his prior H and P: " Max [name] is a 65 y.o.male wiith a history of COPD, hypertension, PTSD, depression, anxiety, chronic pain, sleep apnea, chronic respiratory failure on 3 L and his cannula during the day and 4 L nasal cannula at night. The patient reports productive cough with yellow and gray sputum started 4 days ago and gradually worsened over the past 24 hours. Additionally, the patient has shortness of breath that is worse with ambulation and improved with rest. His breathing has become more labored over the past 24 hours and he notes significant wheezing. The patient presented to the hospital for evaluation.   Hospital Course: Max [name] is an 65 y.o. male with hx of COPD, HTN, anxiety,sleep apnea not CPAP compliance, admitted to the ICU for hypoxia, COPD exacerbation, CAP, and pre renal AKI. He was given IV steroids, neb, IV Rocephin and IV Zithromax, and his AKI has resolved. He improved slowly, and was transferred to the floor, and his antibiotic, nebs, and steroids were continued. He has been taking Klonopin  QID at home. He requested increasing his  Valium, and was given  TID.  He tolerated this dose well and was not lethargic. He is getting better and wanted to go home today. He was deemed stable for discharge.  He will continue with 4 more days of Zithromax, and one week of Prednisone  BID.  He requested Valium and Percocet, and was given #30 of each.  He will follow up with his PCP in one week.  Thank you for allowing me to participate in his care.  Good Day.   Consultations:  None  Discharge Exam: Filed Vitals:   02/21/15 2005 02/22/15 0500  BP: 148/80 131/79  Pulse: 88 64  Temp: 97.8 F (36.6 C) 97.6 F (36.4 C)  Resp: 21 20      Discharge Instructions   Discharge Instructions    Diet - low sodium heart healthy    Complete by:  As directed      Discharge instructions    Complete by:  As directed   Take your medicine as directed.  Follow up with your PCP next week.     Increase activity slowly    Complete by:  As directed           Current Discharge Medication List    START taking these medications   Details  azithromycin 500 mg orally x 5 d, once daily.        CONTINUE these medications which have CHANGED   Details  diazepam (VALIUM) 10 MG tablet Take 1 tablet (10 mg total) by mouth 3 (  three) times daily. Qty: 30 tablet, Refills: 0    oxyCODONE-acetaminophen (PERCOCET/ROXICET) 5-325 MG tablet Take 1 tablet by mouth every 4 (four) hours as needed for moderate pain. Qty: 30 tablet, Refills: 0      CONTINUE these medications which have NOT CHANGED   Details  albuterol (PROVENTIL HFA;VENTOLIN HFA) 108 (90 BASE) MCG/ACT inhaler Inhale 2 puffs into the lungs every 4 (four) hours as needed for wheezing or shortness of breath. Qty: 1 Inhaler, Refills: 3    allopurinol (ZYLOPRIM) 100 MG tablet Take 100 mg by mouth daily.    carisoprodol (SOMA) 350 MG tablet Take 350 mg by mouth 4 (four) times daily as needed for muscle spasms.    escitalopram (LEXAPRO) 20 MG tablet Take 20 mg by mouth 2 (two) times  daily.     famotidine (PEPCID) 20 MG tablet Take 1 tablet (20 mg total) by mouth at bedtime.    gabapentin (NEURONTIN) 300 MG capsule Take 1,200 mg by mouth 3 (three) times daily.    lisinopril-hydrochlorothiazide (PRINZIDE,ZESTORETIC) 20-12.5 MG per tablet Take 1 tablet by mouth daily.    pantoprazole (PROTONIX) 40 MG tablet Take 1 tablet (40 mg total) by mouth daily at 12 noon.    QUEtiapine (SEROQUEL) 100 MG tablet Take 100 mg by mouth at bedtime.    sucralfate (CARAFATE) 1 G tablet Take 1 tablet (1 g total) by mouth 3 (three) times daily with meals.    zolpidem (AMBIEN) 10 MG tablet Take 1 tablet (10 mg total) by mouth at bedtime as needed for sleep. Qty: 30 tablet, Refills: 0    Melatonin 5 MG TABS Take 5 mg by mouth at bedtime.       STOP taking these medications     clonazePAM (KLONOPIN) 1 MG tablet        Allergies  Allergen Reactions  . Flexeril [Cyclobenzaprine Hcl] Hives  . Other Rash    Raw tobacco      The results of significant diagnostics from this hospitalization (including imaging, microbiology, ancillary and laboratory) are listed below for reference.    Significant Diagnostic Studies: Dg Chest 2 View  02/18/2015  CLINICAL DATA:  Productive cough. EXAM: CHEST  2 VIEW COMPARISON:  08/06/2014 FINDINGS: The cardiac silhouette is normal. Mediastinal contours appear intact. There is no evidence of pleural effusion or pneumothorax. There are lower lobe predominant peribronchovascular opacities, worse in the left lower lobe and lingula. Osseous structures are without acute abnormality. Soft tissues are grossly normal. IMPRESSION: Lower lobe predominant peribronchovascular opacities, worse in the left lower lobe and lingula. Differential diagnosis includes interstitial pulmonary edema on the background of chronic interstitial lung disease, or developing of infectious or inflammatory peribronchial infiltrates. Electronically Signed   By: Ted Mcalpine M.D.   On:  02/18/2015 15:16   Ct Chest Wo Contrast  02/18/2015  CLINICAL DATA:  Cough and vomiting for 3 days, fever for 1 day EXAM: CT CHEST WITHOUT CONTRAST TECHNIQUE: Multidetector CT imaging of the chest was performed following the standard protocol without IV contrast. COMPARISON:  10/19/2013 and 11/04/2013 FINDINGS: Images of the thoracic inlet are unremarkable. Sagittal images of the spine shows degenerative changes thoracic spine with multilevel disc space flattening vacuum disc phenomenon and anterior spurring. No mediastinal hematoma or adenopathy. There is significant thickening of esophageal wall up to 1 cm. [name]'s esophagus or reflux esophagitis cannot be excluded. Further correlation is recommended. Atherosclerotic calcifications of thoracic aorta and coronary arteries are noted. A pretracheal lymph node measures 1 cm. AP  window lymph node measures 1 cm in short-axis. Sub- carinal lymph node measures 1.1 cm short-axis. No significant hilar adenopathy. Heart size within normal limits. The visualized upper abdomen shows no adrenal gland mass. Gallbladder is contracted without evidence of calcified gallstones. Visualized unenhanced pancreas is unremarkable. There is bilateral lung parenchyma interstitial prominence with central bandlike distribution. There is also thickening of peripheral interlobular septa. Findings are highly suspicious with chronic interstitial lung disease. No significant honeycombing is noted to suggest definite pulmonary fibrosis however there is some peripheral interstitial thickening in lingula and right middle lobe peripheral. Pulmonary fibrosis cannot be excluded. Axial image 20 there is small patchy alveolar infiltrate in superior segment of the left lower lobe. Small patchy infiltrate is noted in axial image 26 in superior segment of left lower lobe. Acute or subacute interstitial pneumonitis cannot be excluded. Clinical correlation is necessary. No evidence of segmental  infiltrates. Overall significant improvement from prior exam. IMPRESSION: 1. There is bilateral interstitial prominence with bandlike central distribution and also peripheral thickening of interlobular septa. Findings are consistent with chronic interstitial lung disease. There is significant improvement from prior exam of extensive bilateral alveolar infiltrates. 2. No significant honeycombing however there is peripheral thickening of interlobular septa in right middle lobe and lingula. Early pulmonary fibrosis cannot be excluded. Follow-up high-resolution chest CT is recommended in 3-6 months. 3. Small patchy infiltrate is noted in superior segment of left lower lobe. Findings suspicious for acute phase of nonspecific interstitial pneumonitis. Clinical correlation is necessary. 4. No segmental infiltrate or consolidation. Borderline mediastinal adenopathy. 5. There is significant thickening of esophageal wall. [name]'s esophagus or reflux esophagitis cannot be excluded. Further correlation is recommended. 6. Atherosclerotic calcifications of thoracic aorta and coronary arteries. 7. Degenerative changes thoracic spine. Electronically Signed   By: Natasha Mead M.D.   On: 02/18/2015 19:26    Microbiology: Recent Results (from the past 240 hour(s))  Culture, blood (Routine X 2) w Reflex to ID Panel     Status: None (Preliminary result)   Collection Time: 02/18/15  8:15 PM  Result Value Ref Range Status   Specimen Description BLOOD RIGHT HAND  Final   Special Requests BOTTLES DRAWN AEROBIC AND ANAEROBIC 4CC EACH  Final   Culture NO GROWTH 4 DAYS  Final   Report Status PENDING  Incomplete  Culture, blood (Routine X 2) w Reflex to ID Panel     Status: None (Preliminary result)   Collection Time: 02/18/15  8:29 PM  Result Value Ref Range Status   Specimen Description BLOOD RIGHT HAND  Final   Special Requests BOTTLES DRAWN AEROBIC ONLY 4CC ONLY  Final   Culture NO GROWTH 4 DAYS  Final   Report Status  PENDING  Incomplete  MRSA PCR Screening     Status: None   Collection Time: 02/18/15  9:10 PM  Result Value Ref Range Status   MRSA by PCR NEGATIVE NEGATIVE Final    Comment:        The GeneXpert MRSA Assay (FDA approved for NASAL specimens only), is one component of a comprehensive MRSA colonization surveillance program. It is not intended to diagnose MRSA infection nor to guide or monitor treatment for MRSA infections.      Labs: Basic Metabolic Panel:  Recent Labs Lab 02/18/15 1523 02/19/15 0421  NA 141 140  K 4.5 4.5  CL 102 108  CO2 29 25  GLUCOSE 118* 175*  BUN 32* 29*  CREATININE 1.51* 1.12  CALCIUM 8.9 8.5*   CBC:  Recent Labs Lab 02/18/15 1523 02/19/15 0421  WBC 11.1* 9.2  NEUTROABS 6.5  --   HGB 13.9 13.7  HCT 42.0 41.4  MCV 94.6 94.1  PLT 247 241    BNP (last 3 results)  Recent Labs  02/18/15 1523  BNP 16.0    Signed:  Analysse Quinonez MD.  [facility] 02/22/2015, 10:24 AM

## 2015-02-23 LAB — CULTURE, BLOOD (ROUTINE X 2)
Culture: NO GROWTH
Culture: NO GROWTH

## 2015-03-07 ENCOUNTER — Telehealth: Payer: Self-pay | Admitting: Orthopaedic Surgery

## 2015-03-07 NOTE — Telephone Encounter (Signed)
Patient wants a refill of Percocet 5 mgs.-325 mgs.

## 2015-03-08 MED ORDER — OXYCODONE-ACETAMINOPHEN 5-325 MG PO TABS: 1.0000 | ORAL_TABLET | ORAL | Status: DC | PRN

## 2015-03-08 NOTE — Telephone Encounter (Signed)
Rx Printerd

## 2015-03-24 ENCOUNTER — Encounter: Payer: Self-pay | Admitting: Orthopaedic Surgery

## 2015-03-24 ENCOUNTER — Ambulatory Visit (INDEPENDENT_AMBULATORY_CARE_PROVIDER_SITE_OTHER): Payer: Medicare HMO | Admitting: Orthopaedic Surgery

## 2015-03-24 VITALS — BP 121/85 | HR 97 | Temp 96.8°F | Resp 16 | Ht 70.0 in | Wt 250.0 lb

## 2015-03-24 DIAGNOSIS — M25562 Pain in left knee: Secondary | ICD-10-CM

## 2015-03-24 MED ORDER — OXYCODONE-ACETAMINOPHEN 5-325 MG PO TABS: 1.0000 | ORAL_TABLET | ORAL | Status: DC | PRN

## 2015-03-24 NOTE — Patient Instructions (Signed)
Smoking Cessation, Tips for Success If you are ready to quit smoking, congratulations! You have chosen to help yourself be healthier. Cigarettes bring nicotine, tar, carbon monoxide, and other irritants into your body. Your lungs, heart, and blood vessels will be able to work better without these poisons. There are many different ways to quit smoking. Nicotine gum, nicotine patches, a nicotine inhaler, or nicotine nasal spray can help with physical craving. Hypnosis, support groups, and medicines help break the habit of smoking. WHAT THINGS CAN I DO TO MAKE QUITTING EASIER?  Here are some tips to help you quit for good:  Pick a date when you will quit smoking completely. Tell all of your friends and family about your plan to quit on that date.  Do not try to slowly cut down on the number of cigarettes you are smoking. Pick a quit date and quit smoking completely starting on that day.  Throw away all cigarettes.   Clean and remove all ashtrays from your home, work, and car.  On a card, write down your reasons for quitting. Carry the card with you and read it when you get the urge to smoke.  Cleanse your body of nicotine. Drink enough water and fluids to keep your urine clear or pale yellow. Do this after quitting to flush the nicotine from your body.  Learn to predict your moods. Do not let a bad situation be your excuse to have a cigarette. Some situations in your life might tempt you into wanting a cigarette.  Never have "just one" cigarette. It leads to wanting another and another. Remind yourself of your decision to quit.  Change habits associated with smoking. If you smoked while driving or when feeling stressed, try other activities to replace smoking. Stand up when drinking your coffee. Brush your teeth after eating. Sit in a different chair when you read the paper. Avoid alcohol while trying to quit, and try to drink fewer caffeinated beverages. Alcohol and caffeine may urge you to  smoke.  Avoid foods and drinks that can trigger a desire to smoke, such as sugary or spicy foods and alcohol.  Ask people who smoke not to smoke around you.  Have something planned to do right after eating or having a cup of coffee. For example, plan to take a walk or exercise.  Try a relaxation exercise to calm you down and decrease your stress. Remember, you may be tense and nervous for the first 2 weeks after you quit, but this will pass.  Find new activities to keep your hands busy. Play with a pen, coin, or rubber band. Doodle or draw things on paper.  Brush your teeth right after eating. This will help cut down on the craving for the taste of tobacco after meals. You can also try mouthwash.   Use oral substitutes in place of cigarettes. Try using lemon drops, carrots, cinnamon sticks, or chewing gum. Keep them handy so they are available when you have the urge to smoke.  When you have the urge to smoke, try deep breathing.  Designate your home as a nonsmoking area.  If you are a heavy smoker, ask your health care provider about a prescription for nicotine chewing gum. It can ease your withdrawal from nicotine.  Reward yourself. Set aside the cigarette money you save and buy yourself something nice.  Look for support from others. Join a support group or smoking cessation program. Ask someone at home or at work to help you with your plan   to quit smoking.  Always ask yourself, "Do I need this cigarette or is this just a reflex?" Tell yourself, "Today, I choose not to smoke," or "I do not want to smoke." You are reminding yourself of your decision to quit.  Do not replace cigarette smoking with electronic cigarettes (commonly called e-cigarettes). The safety of e-cigarettes is unknown, and some may contain harmful chemicals.  If you relapse, do not give up! Plan ahead and think about what you will do the next time you get the urge to smoke. HOW WILL I FEEL WHEN I QUIT SMOKING? You  may have symptoms of withdrawal because your body is used to nicotine (the addictive substance in cigarettes). You may crave cigarettes, be irritable, feel very hungry, cough often, get headaches, or have difficulty concentrating. The withdrawal symptoms are only temporary. They are strongest when you first quit but will go away within 10-14 days. When withdrawal symptoms occur, stay in control. Think about your reasons for quitting. Remind yourself that these are signs that your body is healing and getting used to being without cigarettes. Remember that withdrawal symptoms are easier to treat than the major diseases that smoking can cause.  Even after the withdrawal is over, expect periodic urges to smoke. However, these cravings are generally short lived and will go away whether you smoke or not. Do not smoke! WHAT RESOURCES ARE AVAILABLE TO HELP ME QUIT SMOKING? Your health care provider can direct you to community resources or hospitals for support, which may include:  Group support.  Education.  Hypnosis.  Therapy.   This information is not intended to replace advice given to you by your health care provider. Make sure you discuss any questions you have with your health care provider.   Document Released: 10/14/2003 Document Revised: 02/05/2014 Document Reviewed: 07/03/2012 Elsevier Interactive Patient Education 2016 [employer]  
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
## 2015-04-14 ENCOUNTER — Emergency Department (HOSPITAL_COMMUNITY)
Admission: EM | Admit: 2015-04-14 | Discharge: 2015-04-14 | Disposition: A | Discharge status: Home / Self Care | Payer: Medicare HMO | Attending: Emergency Medicine | Admitting: Emergency Medicine

## 2015-04-14 ENCOUNTER — Encounter (HOSPITAL_COMMUNITY): Payer: Self-pay

## 2015-04-14 ENCOUNTER — Emergency Department (HOSPITAL_COMMUNITY): Payer: Medicare HMO

## 2015-04-14 DIAGNOSIS — F17210 Nicotine dependence, cigarettes, uncomplicated: Secondary | ICD-10-CM | POA: Insufficient documentation

## 2015-04-14 DIAGNOSIS — I10 Essential (primary) hypertension: Secondary | ICD-10-CM | POA: Insufficient documentation

## 2015-04-14 DIAGNOSIS — M546 Pain in thoracic spine: Secondary | ICD-10-CM | POA: Diagnosis not present

## 2015-04-14 DIAGNOSIS — Z79899 Other long term (current) drug therapy: Secondary | ICD-10-CM | POA: Diagnosis not present

## 2015-04-14 DIAGNOSIS — M549 Dorsalgia, unspecified: Secondary | ICD-10-CM | POA: Diagnosis present

## 2015-04-14 DIAGNOSIS — F329 Major depressive disorder, single episode, unspecified: Secondary | ICD-10-CM | POA: Diagnosis not present

## 2015-04-14 LAB — URINALYSIS, ROUTINE W REFLEX MICROSCOPIC
Bilirubin Urine: NEGATIVE
Glucose, UA: NEGATIVE mg/dL
Hgb urine dipstick: NEGATIVE
Ketones, ur: NEGATIVE mg/dL
Leukocytes, UA: NEGATIVE
Nitrite: NEGATIVE
Protein, ur: NEGATIVE mg/dL
Specific Gravity, Urine: 1.025 (ref 1.005–1.030)
pH: 5 (ref 5.0–8.0)

## 2015-04-14 MED ORDER — DIAZEPAM 5 MG PO TABS
5.0000 mg | ORAL_TABLET | Freq: Once | ORAL | Status: AC
  Administered 2015-04-14: 5 mg via ORAL
  Filled 2015-04-14: qty 1

## 2015-04-14 MED ORDER — HYDROMORPHONE HCL 1 MG/ML IJ SOLN
1.0000 mg | Freq: Once | INTRAMUSCULAR | Status: AC
  Administered 2015-04-14: 1 mg via INTRAMUSCULAR
  Filled 2015-04-14: qty 1

## 2015-04-14 MED ORDER — KETOROLAC TROMETHAMINE 30 MG/ML IJ SOLN
15.0000 mg | Freq: Once | INTRAMUSCULAR | Status: AC
  Administered 2015-04-14: 15 mg via INTRAMUSCULAR
  Filled 2015-04-14: qty 1
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
## 2015-04-14 NOTE — ED Notes (Signed)
Patient states that his BP usually runs borderline high. BP started running low about a week ago.

## 2015-04-14 NOTE — ED Notes (Signed)
Patient was discharge to waiting room with oxygen, until ride arrived, no distress noted.

## 2015-04-14 NOTE — Discharge Instructions (Signed)

## 2015-04-14 NOTE — ED Notes (Signed)
Lower back pain, right flank pain onset this am. Pt denies injury or trauma

## 2015-04-16 ENCOUNTER — Encounter (HOSPITAL_COMMUNITY): Payer: Self-pay | Admitting: Cardiology

## 2015-04-16 ENCOUNTER — Emergency Department (HOSPITAL_COMMUNITY)
Admission: EM | Admit: 2015-04-16 | Discharge: 2015-04-16 | Discharge status: Against Medical Advice | Payer: Medicare HMO | Attending: Emergency Medicine | Admitting: Emergency Medicine

## 2015-04-16 DIAGNOSIS — G8929 Other chronic pain: Secondary | ICD-10-CM | POA: Insufficient documentation

## 2015-04-16 DIAGNOSIS — M545 Low back pain: Secondary | ICD-10-CM | POA: Diagnosis not present

## 2015-04-16 DIAGNOSIS — F329 Major depressive disorder, single episode, unspecified: Secondary | ICD-10-CM | POA: Diagnosis not present

## 2015-04-16 DIAGNOSIS — I10 Essential (primary) hypertension: Secondary | ICD-10-CM | POA: Insufficient documentation

## 2015-04-16 DIAGNOSIS — Z765 Malingerer [conscious simulation]: Secondary | ICD-10-CM

## 2015-04-16 DIAGNOSIS — Z79899 Other long term (current) drug therapy: Secondary | ICD-10-CM | POA: Diagnosis not present

## 2015-04-16 DIAGNOSIS — J441 Chronic obstructive pulmonary disease with (acute) exacerbation: Secondary | ICD-10-CM | POA: Diagnosis not present

## 2015-04-16 DIAGNOSIS — Z7289 Other problems related to lifestyle: Secondary | ICD-10-CM | POA: Diagnosis not present

## 2015-04-16 DIAGNOSIS — F17210 Nicotine dependence, cigarettes, uncomplicated: Secondary | ICD-10-CM | POA: Insufficient documentation

## 2015-04-16 DIAGNOSIS — M549 Dorsalgia, unspecified: Secondary | ICD-10-CM

## 2015-04-16 HISTORY — DX: Other chronic pain: G89.29

## 2015-04-16 HISTORY — DX: Radiculopathy, lumbar region: M54.16

## 2015-04-16 HISTORY — DX: Dorsalgia, unspecified: M54.9

## 2015-04-16 HISTORY — DX: Pain in unspecified knee: M25.569

## 2015-04-16 MED ORDER — ALBUTEROL SULFATE (2.5 MG/3ML) 0.083% IN NEBU
2.5000 mg | INHALATION_SOLUTION | Freq: Once | RESPIRATORY_TRACT | Status: AC
  Administered 2015-04-16: 2.5 mg via RESPIRATORY_TRACT
  Filled 2015-04-16: qty 3

## 2015-04-16 MED ORDER — PREDNISONE 50 MG PO TABS
60.0000 mg | ORAL_TABLET | Freq: Once | ORAL | Status: AC
  Administered 2015-04-16: 60 mg via ORAL
  Filled 2015-04-16: qty 1

## 2015-04-16 MED ORDER — IPRATROPIUM-ALBUTEROL 0.5-2.5 (3) MG/3ML IN SOLN
3.0000 mL | Freq: Once | RESPIRATORY_TRACT | Status: AC
  Administered 2015-04-16: 3 mL via RESPIRATORY_TRACT
  Filled 2015-04-16: qty 3

## 2015-04-16 MED ORDER — OXYCODONE-ACETAMINOPHEN 5-325 MG PO TABS
1.0000 | ORAL_TABLET | Freq: Once | ORAL | Status: AC
  Administered 2015-04-16: 1 via ORAL
  Filled 2015-04-16: qty 1

## 2015-04-16 NOTE — ED Notes (Addendum)
Right lower back pain times 24 hours.  [facility] and was diagnosed with "back sprain"  C/o burning with urination.  Pt has audible wheezing , per pt states he normally wheezes.

## 2015-04-16 NOTE — ED Notes (Signed)
Per EMS pt told them that he was out of his percocet and can not get a refill for one week.

## 2015-04-16 NOTE — ED Notes (Signed)
Patient refused recheck of VS

## 2015-04-16 NOTE — ED Notes (Addendum)
Pt upset stating he is ready to "leave AMA, shes not going to do anything else for my but run up the time on my bill". Explained what leaving AMA meant in front of patient, family member and [place], ED tech. Patient signed AMA and left in a wheelchair with family members at this time. Pt upset/ frustrated and using profanity
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
## 2015-04-16 NOTE — ED Notes (Signed)
Pt upset, yelling that he's ready to leave and go AMA "because I only got one Percocet and she aint doing shit for me". Explained to him that another breathing treatment has been ordered and RT was in the room sitting it up. He agreed to stay for another treatment but then stated "if she aint goin give me any more pain pills I'm leaving after that". Pt upset/ frustrated and using profanity

## 2015-04-18 ENCOUNTER — Telehealth: Payer: Self-pay | Admitting: Orthopaedic Surgery

## 2015-04-18 NOTE — Telephone Encounter (Signed)
Patient requesting refill of Oxycodone/Acetaminophen 3/325mg   Qty 120 Tablets

## 2015-04-19 MED ORDER — OXYCODONE-ACETAMINOPHEN 5-325 MG PO TABS: 1.0000 | ORAL_TABLET | ORAL | Status: DC | PRN

## 2015-04-19 NOTE — Telephone Encounter (Signed)
Rx printed

## 2015-05-05 ENCOUNTER — Telehealth: Payer: Self-pay | Admitting: Orthopaedic Surgery

## 2015-05-05 ENCOUNTER — Ambulatory Visit: Payer: Medicare HMO | Admitting: Orthopaedic Surgery

## 2015-05-05 MED ORDER — OXYCODONE-ACETAMINOPHEN 5-325 MG PO TABS: 1.0000 | ORAL_TABLET | ORAL | Status: DC | PRN

## 2015-05-05 NOTE — Telephone Encounter (Signed)
Patient picked up prescription today, 05/05/15.

## 2015-05-05 NOTE — Telephone Encounter (Signed)
Refill request, pain medication - due today's appointment placed on hold per insurance change ([facility], and referral process pending) oxyCODONE-acetaminophen (PERCOCET/ROXICET) 5-325 MG tablet [[id number]][[id number]] - patient in waiting room at this time; aware of appointment pending.

## 2015-05-05 NOTE — Telephone Encounter (Signed)
Rx done. 

## 2015-05-31 ENCOUNTER — Emergency Department (HOSPITAL_COMMUNITY)
Admission: EM | Admit: 2015-05-31 | Discharge: 2015-05-31 | Disposition: A | Discharge status: Home / Self Care | Payer: Medicare HMO | Attending: Emergency Medicine | Admitting: Emergency Medicine

## 2015-05-31 ENCOUNTER — Encounter (HOSPITAL_COMMUNITY): Payer: Self-pay

## 2015-05-31 DIAGNOSIS — F17210 Nicotine dependence, cigarettes, uncomplicated: Secondary | ICD-10-CM | POA: Insufficient documentation

## 2015-05-31 DIAGNOSIS — Z79899 Other long term (current) drug therapy: Secondary | ICD-10-CM | POA: Diagnosis not present

## 2015-05-31 DIAGNOSIS — M5432 Sciatica, left side: Secondary | ICD-10-CM

## 2015-05-31 DIAGNOSIS — I10 Essential (primary) hypertension: Secondary | ICD-10-CM | POA: Insufficient documentation

## 2015-05-31 DIAGNOSIS — G8929 Other chronic pain: Secondary | ICD-10-CM | POA: Diagnosis not present

## 2015-05-31 DIAGNOSIS — F329 Major depressive disorder, single episode, unspecified: Secondary | ICD-10-CM | POA: Insufficient documentation

## 2015-05-31 DIAGNOSIS — M5442 Lumbago with sciatica, left side: Secondary | ICD-10-CM | POA: Diagnosis not present

## 2015-05-31 MED ORDER — HYDROMORPHONE HCL 1 MG/ML IJ SOLN
1.0000 mg | Freq: Once | INTRAMUSCULAR | Status: AC
  Administered 2015-05-31: 1 mg via INTRAMUSCULAR
  Filled 2015-05-31: qty 1

## 2015-05-31 MED ORDER — KETOROLAC TROMETHAMINE 60 MG/2ML IM SOLN
30.0000 mg | Freq: Once | INTRAMUSCULAR | Status: AC
  Administered 2015-05-31: 30 mg via INTRAMUSCULAR
  Filled 2015-05-31: qty 2

## 2015-05-31 NOTE — ED Notes (Signed)
Pt c/o back pain x 3 days radiating down r leg.  Reports history of sciatica.

## 2015-05-31 NOTE — ED Notes (Signed)
Pt making multiple calls out to nurses station. Pt informed that pain medication can not be given until evaluated by EDP.

## 2015-05-31 NOTE — ED Notes (Signed)
EDP at bedside  
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
## 2015-05-31 NOTE — Discharge Instructions (Signed)
°  Sciatica °Sciatica is pain, weakness, numbness, or tingling along your sciatic nerve. The nerve starts in the lower back and runs down the back of each leg. Nerve damage or certain conditions pinch or put pressure on the sciatic nerve. This causes the pain, weakness, and other discomforts of sciatica. °HOME CARE  °· Only take medicine as told by your doctor. °· Apply ice to the affected area for 20 minutes. Do this 3-4 times a day for the first 48-72 hours. Then try heat in the same way. °· Exercise, stretch, or do your usual activities if these do not make your pain worse. °· Go to physical therapy as told by your doctor. °· Keep all doctor visits as told. °· Do not wear high heels or shoes that are not supportive. °· Get a firm mattress if your mattress is too soft to lessen pain and discomfort. °GET HELP RIGHT AWAY IF:  °· You cannot control when you poop (bowel movement) or pee (urinate). °· You have more weakness in your lower back, lower belly (pelvis), butt (buttocks), or legs. °· You have redness or puffiness (swelling) of your back. °· You have a burning feeling when you pee. °· You have pain that gets worse when you lie down. °· You have pain that wakes you from your sleep. °· Your pain is worse than past pain. °· Your pain lasts longer than 4 weeks. °· You are suddenly losing weight without reason. °MAKE SURE YOU:  °· Understand these instructions. °· Will watch this condition. °· Will get help right away if you are not doing well or get worse. °  °This information is not intended to replace advice given to you by your health care provider. Make sure you discuss any questions you have with your health care provider. °  °Document Released: 10/25/2007 Document Revised: 10/06/2014 Document Reviewed: 05/27/2011 °Elsevier Interactive Patient Education ©2016 [employer] ° ° °

## 2015-06-03 ENCOUNTER — Encounter (HOSPITAL_COMMUNITY): Payer: Self-pay

## 2015-06-03 ENCOUNTER — Emergency Department (HOSPITAL_COMMUNITY)
Admission: EM | Admit: 2015-06-03 | Discharge: 2015-06-03 | Disposition: A | Discharge status: Home / Self Care | Payer: Medicare HMO | Attending: Emergency Medicine | Admitting: Emergency Medicine

## 2015-06-03 DIAGNOSIS — I10 Essential (primary) hypertension: Secondary | ICD-10-CM | POA: Diagnosis not present

## 2015-06-03 DIAGNOSIS — M545 Low back pain: Secondary | ICD-10-CM | POA: Insufficient documentation

## 2015-06-03 DIAGNOSIS — Z79899 Other long term (current) drug therapy: Secondary | ICD-10-CM | POA: Insufficient documentation

## 2015-06-03 DIAGNOSIS — F329 Major depressive disorder, single episode, unspecified: Secondary | ICD-10-CM | POA: Diagnosis not present

## 2015-06-03 DIAGNOSIS — G8929 Other chronic pain: Secondary | ICD-10-CM | POA: Insufficient documentation

## 2015-06-03 DIAGNOSIS — M549 Dorsalgia, unspecified: Secondary | ICD-10-CM

## 2015-06-03 DIAGNOSIS — F17210 Nicotine dependence, cigarettes, uncomplicated: Secondary | ICD-10-CM | POA: Diagnosis not present

## 2015-06-03 MED ORDER — HYDROMORPHONE HCL 1 MG/ML IJ SOLN
1.0000 mg | Freq: Once | INTRAMUSCULAR | Status: AC
  Administered 2015-06-03: 1 mg via INTRAMUSCULAR
  Filled 2015-06-03: qty 1

## 2015-06-03 MED ORDER — KETOROLAC TROMETHAMINE 30 MG/ML IJ SOLN
60.0000 mg | Freq: Once | INTRAMUSCULAR | Status: AC
  Administered 2015-06-03: 60 mg via INTRAMUSCULAR
  Filled 2015-06-03: qty 2

## 2015-06-03 NOTE — Discharge Instructions (Signed)

## 2015-06-03 NOTE — ED Provider Notes (Signed)
TIME SEEN: 5:15 AM  CHIEF COMPLAINT: Chronic back pain  HPI: Pt is a 65 y.o. male with history of chronic back pain who just received 100 tablets of Percocet on April 24 who presents to the emergency department with complaints of back pain that radiates down his right leg. No new numbness, tingling or focal weakness. No bowel or bladder incontinence. No fever. Reports he had an injection in his back which helped his pain approximate 6 months ago. No history of back surgery. No fever. Not immunocompromised. States pain is not changed in the past 2 months. No new injury. Patient took 3 Percocet tablets at home prior to calling EMS to come into the emergency department.  ROS: See HPI Constitutional: no fever  Eyes: no drainage  ENT: no runny nose   Cardiovascular:  no chest pain  Resp: no SOB  GI: no vomiting GU: no dysuria Integumentary: no rash  Allergy: no hives  Musculoskeletal: no leg swelling  Neurological: no slurred speech ROS otherwise negative  PAST MEDICAL HISTORY/PAST SURGICAL HISTORY:  Past Medical History  Diagnosis Date  . Sciatica   . Bulging discs     neck  . Torn rotator cuff   . Anxiety   . Sleep apnea     supposed to wear CPAP but doesn't  . Syncopal episodes     pt states "it happens after i try to get up after smoking a cigarette"  . Chronic pain   . On home O2     3L N/C  . Hypertension   . PTSD (post-traumatic stress disorder)   . Depression   . Chronic back pain   . Lumbar radiculopathy   . Chronic knee pain     MEDICATIONS:  Prior to Admission medications   Medication Sig Start Date End Date Taking? Authorizing Provider  albuterol (PROVENTIL HFA;VENTOLIN HFA) 108 (90 BASE) MCG/ACT inhaler Inhale 2 puffs into the lungs every 4 (four) hours as needed for wheezing or shortness of breath. 09/20/13   Eber HongBrian Miller, MD  allopurinol (ZYLOPRIM) 100 MG tablet Take 100 mg by mouth daily.    Historical Provider, MD  carisoprodol (SOMA) 350 MG tablet Take 350  mg by mouth 4 (four) times daily as needed for muscle spasms.    Historical Provider, MD  clonazePAM (KLONOPIN) 1 MG tablet Take 1 mg by mouth daily. 04/09/15   Historical Provider, MD  escitalopram (LEXAPRO) 20 MG tablet Take 20 mg by mouth 2 (two) times daily.     Historical Provider, MD  famotidine (PEPCID) 20 MG tablet Take 1 tablet (20 mg total) by mouth at bedtime. 11/08/13   Tammy S Parrett, NP  gabapentin (NEURONTIN) 600 MG tablet Take 1,200 mg by mouth 3 (three) times daily. 04/07/15   Historical Provider, MD  Melatonin 5 MG TABS Take 5 mg by mouth at bedtime.     Historical Provider, MD  oxyCODONE-acetaminophen (PERCOCET/ROXICET) 5-325 MG tablet Take 1 tablet by mouth every 4 (four) hours as needed for moderate pain or severe pain (Must last 30 days.  Do not drive or operate machinery while taking this medicine). 05/05/15   Darreld McleanWayne Keeling, MD  pantoprazole (PROTONIX) 40 MG tablet Take 1 tablet (40 mg total) by mouth daily at 12 noon. Patient taking differently: Take 40 mg by mouth daily.  11/08/13   Tammy S Parrett, NP  QUEtiapine (SEROQUEL) 100 MG tablet Take 100 mg by mouth at bedtime.    Historical Provider, MD  sucralfate (CARAFATE) 1 G tablet  Take 1 tablet (1 g total) by mouth 3 (three) times daily with meals. 11/08/13   Tammy S Parrett, NP  zolpidem (AMBIEN) 10 MG tablet Take 1 tablet (10 mg total) by mouth at bedtime as needed for sleep. 11/08/13   Julio Sicks, NP    ALLERGIES:  Allergies  Allergen Reactions  . Flexeril [Cyclobenzaprine Hcl] Hives  . Cyclobenzaprine Anxiety    Restless leg   . Other Rash    Raw tobacco    SOCIAL HISTORY:  Social History  Substance Use Topics  . Smoking status: Current Every Day Smoker -- 0.20 packs/day for 5 years    Types: Cigarettes    Last Attempt to Quit: 07/29/2013  . Smokeless tobacco: Former Neurosurgeon    Quit date: 09/20/2013     Comment: 1 pack per week   . Alcohol Use: 1.2 - 1.8 oz/week    2-3 Cans of beer per week     Comment:  occasionally    FAMILY HISTORY: Family History  Problem Relation Age of Onset  . Hypertension Sister   . Diabetes Sister   . Dementia Mother     alzheimer's  . Myasthenia gravis Father     EXAM: BP 119/76 mmHg  Pulse 84  Temp(Src) 98.5 F (36.9 C) (Oral)  Resp 20  Ht  (1.803 m)  Wt 260 lb (117.935 kg)  BMI 36.28 kg/m2  SpO2 98% CONSTITUTIONAL: Alert and oriented and responds appropriately to questions. Well-appearing; well-nourished, Smiling, appears comfortable, afebrile, nontoxic HEAD: Normocephalic EYES: Conjunctivae clear, PERRL ENT: normal nose; no rhinorrhea; moist mucous membranes NECK: Supple, no meningismus, no LAD  CARD: RRR; S1 and S2 appreciated; no murmurs, no clicks, no rubs, no gallops RESP: Normal chest excursion without splinting or tachypnea; breath sounds clear and equal bilaterally; no wheezes, no rhonchi, no rales, no hypoxia or respiratory distress, speaking full sentences ABD/GI: Normal bowel sounds; non-distended; soft, non-tender, no rebound, no guarding, no peritoneal signs BACK:  The back appears normal and is tender to palpation over thoracic and lumbar paraspinal musculature bilaterally with mild associated spasm, no midline spinal tenderness or step-off or deformity, no CVA tenderness EXT: Normal ROM in all joints; non-tender to palpation; no edema; normal capillary refill; no cyanosis, no calf tenderness or swelling    SKIN: Normal color for age and race; warm; no rash NEURO: Moves all extremities equally, sensation to light touch intact diffusely, cranial nerves II through XII intact, patient is able to sit himself upright in the bed and transfer from the stretcher to the bed on his own, 2+ deep tender and legs is bilateral upper lower exudate, no clonus, strength 5/5 in all 4 extremities, no saddle anesthesia PSYCH: The patient's mood and manner are appropriate. Grooming and personal hygiene are appropriate.  MEDICAL DECISION MAKING: Patient  here with chronic back pain. No new emergent concern. No red flex symptoms. Do not feel he needs emergent imaging. He is asking for some thing for pain. Was seen here 3 days ago for the same and given Toradol and Dilaudid with good relief. We'll give IM injections of the same. Discussed with him that he should follow-up with his PCP and orthopedic physician for further follow-up. He was just prescribed 100 Percocet tablets on April 24. Discussed with him that we would not be providing him any further pain medication as outpatient. Discussed return precautions. He verbalizes understanding and is comfortable with this plan.   At this time, I do not feel there is  any life-threatening condition present. I have reviewed and discussed all results (EKG, imaging, lab, urine as appropriate), exam findings with patient. I have reviewed nursing notes and appropriate previous records.  I feel the patient is safe to be discharged home without further emergent workup. Discussed usual and customary return precautions. Patient and family (if present) verbalize understanding and are comfortable with this plan.  Patient will follow-up with their primary care provider. If they do not have a primary care provider, information for follow-up has been provided to them. All questions have been answered.      Layla Maw Takeya Marquis, DO 06/03/15 [phone]

## 2015-06-03 NOTE — ED Notes (Signed)
Pt here by ems for lower back pain, seen here on the 2nd for same.

## 2015-06-13 ENCOUNTER — Emergency Department (HOSPITAL_COMMUNITY)
Admission: EM | Admit: 2015-06-13 | Discharge: 2015-06-14 | Disposition: A | Discharge status: Home / Self Care | Payer: Medicare HMO | Attending: Emergency Medicine | Admitting: Emergency Medicine

## 2015-06-13 ENCOUNTER — Emergency Department (HOSPITAL_COMMUNITY): Payer: Medicare HMO

## 2015-06-13 ENCOUNTER — Encounter (HOSPITAL_COMMUNITY): Payer: Self-pay | Admitting: Emergency Medicine

## 2015-06-13 DIAGNOSIS — M546 Pain in thoracic spine: Secondary | ICD-10-CM

## 2015-06-13 DIAGNOSIS — R109 Unspecified abdominal pain: Secondary | ICD-10-CM | POA: Insufficient documentation

## 2015-06-13 DIAGNOSIS — R0789 Other chest pain: Secondary | ICD-10-CM | POA: Diagnosis not present

## 2015-06-13 DIAGNOSIS — F329 Major depressive disorder, single episode, unspecified: Secondary | ICD-10-CM | POA: Insufficient documentation

## 2015-06-13 DIAGNOSIS — Z79891 Long term (current) use of opiate analgesic: Secondary | ICD-10-CM | POA: Diagnosis not present

## 2015-06-13 DIAGNOSIS — F17210 Nicotine dependence, cigarettes, uncomplicated: Secondary | ICD-10-CM | POA: Insufficient documentation

## 2015-06-13 DIAGNOSIS — I10 Essential (primary) hypertension: Secondary | ICD-10-CM | POA: Insufficient documentation

## 2015-06-13 DIAGNOSIS — Z79899 Other long term (current) drug therapy: Secondary | ICD-10-CM | POA: Insufficient documentation

## 2015-06-13 MED ORDER — NITROGLYCERIN 0.4 MG/SPRAY TL SOLN
1.0000 | Freq: Once | Status: AC
  Administered 2015-06-13: 1 via SUBLINGUAL
  Filled 2015-06-13: qty 4.9

## 2015-06-13 MED ORDER — METHOCARBAMOL 500 MG PO TABS
500.0000 mg | ORAL_TABLET | Freq: Once | ORAL | Status: AC
  Administered 2015-06-13: 500 mg via ORAL
  Filled 2015-06-13: qty 1

## 2015-06-13 MED ORDER — ASPIRIN 81 MG PO CHEW
324.0000 mg | CHEWABLE_TABLET | Freq: Once | ORAL | Status: AC
  Administered 2015-06-13: 324 mg via ORAL
  Filled 2015-06-13: qty 4

## 2015-06-13 NOTE — ED Notes (Addendum)
Pt c/o left upper abd pain. Per EMS pt ran out of pain medication today and states he needs to come to the er for pain meds.
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
## 2015-06-14 DIAGNOSIS — M546 Pain in thoracic spine: Secondary | ICD-10-CM | POA: Diagnosis not present

## 2015-06-14 LAB — BASIC METABOLIC PANEL
Anion gap: 8 (ref 5–15)
BUN: 11 mg/dL (ref 6–20)
CO2: 30 mmol/L (ref 22–32)
Calcium: 8.6 mg/dL — ABNORMAL LOW (ref 8.9–10.3)
Chloride: 100 mmol/L — ABNORMAL LOW (ref 101–111)
Creatinine, Ser: 1.14 mg/dL (ref 0.61–1.24)
GFR calc Af Amer: >60 mL/min (ref >60)
GFR calc non Af Amer: >60 mL/min (ref >60)
Glucose, Bld: 117 mg/dL — ABNORMAL HIGH (ref 65–99)
Potassium: 3.6 mmol/L (ref 3.5–5.1)
Sodium: 138 mmol/L (ref 135–145)

## 2015-06-14 LAB — CBC
HCT: 41.5 % (ref 39.0–52.0)
Hemoglobin: 13.4 g/dL (ref 13.0–17.0)
MCH: 30.1 pg (ref 26.0–34.0)
MCHC: 32.3 g/dL (ref 30.0–36.0)
MCV: 93.3 fL (ref 78.0–100.0)
Platelets: 441 10*3/uL — ABNORMAL HIGH (ref 150–400)
RBC: 4.45 MIL/uL (ref 4.22–5.81)
RDW: 14.3 % (ref 11.5–15.5)
WBC: 12.9 10*3/uL — ABNORMAL HIGH (ref 4.0–10.5)

## 2015-06-14 LAB — TROPONIN I: Troponin I: <0.03 ng/mL (ref <0.031)

## 2015-06-14 MED ORDER — METHOCARBAMOL 500 MG PO TABS: 500.0000 mg | ORAL_TABLET | Freq: Two times a day (BID) | ORAL | Status: DC | PRN

## 2015-06-14 NOTE — Discharge Instructions (Signed)

## 2015-06-15 ENCOUNTER — Ambulatory Visit (INDEPENDENT_AMBULATORY_CARE_PROVIDER_SITE_OTHER): Payer: 59 | Admitting: Orthopaedic Surgery

## 2015-06-15 ENCOUNTER — Encounter: Payer: Self-pay | Admitting: Orthopaedic Surgery

## 2015-06-15 VITALS — BP 147/82 | HR 73 | Temp 97.7°F | Ht 70.0 in | Wt 251.0 lb

## 2015-06-15 DIAGNOSIS — M25562 Pain in left knee: Secondary | ICD-10-CM | POA: Diagnosis not present

## 2015-06-15 DIAGNOSIS — M545 Low back pain, unspecified: Secondary | ICD-10-CM

## 2015-06-15 DIAGNOSIS — G8929 Other chronic pain: Secondary | ICD-10-CM | POA: Diagnosis not present

## 2015-06-15 MED ORDER — OXYCODONE-ACETAMINOPHEN 5-325 MG PO TABS: 1.0000 | ORAL_TABLET | ORAL | Status: DC | PRN
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
## 2015-06-30 DIAGNOSIS — J189 Pneumonia, unspecified organism: Secondary | ICD-10-CM

## 2015-06-30 HISTORY — DX: Pneumonia, unspecified organism: J18.9

## 2015-07-04 ENCOUNTER — Encounter (HOSPITAL_COMMUNITY): Payer: Self-pay | Admitting: Emergency Medicine

## 2015-07-04 ENCOUNTER — Emergency Department (HOSPITAL_COMMUNITY): Payer: Medicare HMO

## 2015-07-04 ENCOUNTER — Inpatient Hospital Stay (HOSPITAL_COMMUNITY): Payer: Medicare HMO

## 2015-07-04 ENCOUNTER — Inpatient Hospital Stay (HOSPITAL_COMMUNITY)
Admission: EM | Admit: 2015-07-04 | Discharge: 2015-07-07 | DRG: 871 | Disposition: A | Discharge status: Home / Self Care | Payer: Medicare HMO | Attending: Family Medicine | Admitting: Family Medicine

## 2015-07-04 DIAGNOSIS — Z9981 Dependence on supplemental oxygen: Secondary | ICD-10-CM

## 2015-07-04 DIAGNOSIS — R32 Unspecified urinary incontinence: Secondary | ICD-10-CM | POA: Diagnosis present

## 2015-07-04 DIAGNOSIS — I10 Essential (primary) hypertension: Secondary | ICD-10-CM | POA: Diagnosis present

## 2015-07-04 DIAGNOSIS — K222 Esophageal obstruction: Secondary | ICD-10-CM | POA: Diagnosis present

## 2015-07-04 DIAGNOSIS — R7303 Prediabetes: Secondary | ICD-10-CM | POA: Diagnosis present

## 2015-07-04 DIAGNOSIS — J440 Chronic obstructive pulmonary disease with acute lower respiratory infection: Secondary | ICD-10-CM | POA: Diagnosis present

## 2015-07-04 DIAGNOSIS — E872 Acidosis, unspecified: Secondary | ICD-10-CM | POA: Diagnosis present

## 2015-07-04 DIAGNOSIS — Z833 Family history of diabetes mellitus: Secondary | ICD-10-CM | POA: Diagnosis not present

## 2015-07-04 DIAGNOSIS — F17210 Nicotine dependence, cigarettes, uncomplicated: Secondary | ICD-10-CM | POA: Diagnosis present

## 2015-07-04 DIAGNOSIS — N179 Acute kidney failure, unspecified: Secondary | ICD-10-CM | POA: Diagnosis present

## 2015-07-04 DIAGNOSIS — Z8701 Personal history of pneumonia (recurrent): Secondary | ICD-10-CM | POA: Diagnosis not present

## 2015-07-04 DIAGNOSIS — A419 Sepsis, unspecified organism: Secondary | ICD-10-CM | POA: Diagnosis not present

## 2015-07-04 DIAGNOSIS — Z9119 Patient's noncompliance with other medical treatment and regimen: Secondary | ICD-10-CM | POA: Diagnosis not present

## 2015-07-04 DIAGNOSIS — G4733 Obstructive sleep apnea (adult) (pediatric): Secondary | ICD-10-CM | POA: Diagnosis present

## 2015-07-04 DIAGNOSIS — J9620 Acute and chronic respiratory failure, unspecified whether with hypoxia or hypercapnia: Secondary | ICD-10-CM | POA: Insufficient documentation

## 2015-07-04 DIAGNOSIS — F4310 Post-traumatic stress disorder, unspecified: Secondary | ICD-10-CM | POA: Diagnosis present

## 2015-07-04 DIAGNOSIS — Z888 Allergy status to other drugs, medicaments and biological substances status: Secondary | ICD-10-CM | POA: Diagnosis not present

## 2015-07-04 DIAGNOSIS — F329 Major depressive disorder, single episode, unspecified: Secondary | ICD-10-CM | POA: Diagnosis present

## 2015-07-04 DIAGNOSIS — F419 Anxiety disorder, unspecified: Secondary | ICD-10-CM | POA: Diagnosis present

## 2015-07-04 DIAGNOSIS — G8929 Other chronic pain: Secondary | ICD-10-CM | POA: Diagnosis present

## 2015-07-04 DIAGNOSIS — R6520 Severe sepsis without septic shock: Secondary | ICD-10-CM | POA: Diagnosis present

## 2015-07-04 DIAGNOSIS — G9341 Metabolic encephalopathy: Secondary | ICD-10-CM | POA: Diagnosis present

## 2015-07-04 DIAGNOSIS — D72829 Elevated white blood cell count, unspecified: Secondary | ICD-10-CM | POA: Diagnosis present

## 2015-07-04 DIAGNOSIS — R739 Hyperglycemia, unspecified: Secondary | ICD-10-CM | POA: Diagnosis present

## 2015-07-04 DIAGNOSIS — J9600 Acute respiratory failure, unspecified whether with hypoxia or hypercapnia: Secondary | ICD-10-CM | POA: Diagnosis not present

## 2015-07-04 DIAGNOSIS — J8410 Pulmonary fibrosis, unspecified: Secondary | ICD-10-CM | POA: Diagnosis present

## 2015-07-04 DIAGNOSIS — K219 Gastro-esophageal reflux disease without esophagitis: Secondary | ICD-10-CM | POA: Diagnosis present

## 2015-07-04 DIAGNOSIS — R1313 Dysphagia, pharyngeal phase: Secondary | ICD-10-CM | POA: Diagnosis present

## 2015-07-04 DIAGNOSIS — J9621 Acute and chronic respiratory failure with hypoxia: Secondary | ICD-10-CM | POA: Diagnosis present

## 2015-07-04 DIAGNOSIS — J189 Pneumonia, unspecified organism: Secondary | ICD-10-CM | POA: Diagnosis present

## 2015-07-04 DIAGNOSIS — G9340 Encephalopathy, unspecified: Secondary | ICD-10-CM | POA: Diagnosis not present

## 2015-07-04 DIAGNOSIS — R1310 Dysphagia, unspecified: Secondary | ICD-10-CM

## 2015-07-04 HISTORY — DX: Acute and chronic respiratory failure, unspecified whether with hypoxia or hypercapnia: J96.20

## 2015-07-04 HISTORY — DX: Chronic obstructive pulmonary disease, unspecified: J44.9

## 2015-07-04 HISTORY — DX: Pneumonia, unspecified organism: J18.9

## 2015-07-04 LAB — ETHANOL: Alcohol, Ethyl (B): <5 mg/dL (ref <5)

## 2015-07-04 LAB — CBC WITH DIFFERENTIAL/PLATELET
Basophils Absolute: 0 10*3/uL (ref 0.0–0.1)
Basophils Relative: 0 %
Eosinophils Absolute: 0 10*3/uL (ref 0.0–0.7)
Eosinophils Relative: 0 %
HCT: 44.5 % (ref 39.0–52.0)
Hemoglobin: 14.4 g/dL (ref 13.0–17.0)
Lymphocytes Relative: 9 %
Lymphs Abs: 2 10*3/uL (ref 0.7–4.0)
MCH: 29.1 pg (ref 26.0–34.0)
MCHC: 32.4 g/dL (ref 30.0–36.0)
MCV: 90.1 fL (ref 78.0–100.0)
Monocytes Absolute: 2.1 10*3/uL — ABNORMAL HIGH (ref 0.1–1.0)
Monocytes Relative: 10 %
Neutro Abs: 17.4 10*3/uL — ABNORMAL HIGH (ref 1.7–7.7)
Neutrophils Relative %: 81 %
Platelets: 201 10*3/uL (ref 150–400)
RBC: 4.94 MIL/uL (ref 4.22–5.81)
RDW: 15.1 % (ref 11.5–15.5)
WBC: 21.5 10*3/uL — ABNORMAL HIGH (ref 4.0–10.5)

## 2015-07-04 LAB — URINALYSIS, ROUTINE W REFLEX MICROSCOPIC
Bilirubin Urine: NEGATIVE
Glucose, UA: NEGATIVE mg/dL
Hgb urine dipstick: NEGATIVE
Ketones, ur: NEGATIVE mg/dL
Leukocytes, UA: NEGATIVE
Nitrite: NEGATIVE
Protein, ur: NEGATIVE mg/dL
Specific Gravity, Urine: 1.015 (ref 1.005–1.030)
pH: 6 (ref 5.0–8.0)

## 2015-07-04 LAB — COMPREHENSIVE METABOLIC PANEL
ALT: 19 U/L (ref 17–63)
AST: 23 U/L (ref 15–41)
Albumin: 3.5 g/dL (ref 3.5–5.0)
Alkaline Phosphatase: 89 U/L (ref 38–126)
Anion gap: 13 (ref 5–15)
BUN: 20 mg/dL (ref 6–20)
CO2: 21 mmol/L — ABNORMAL LOW (ref 22–32)
Calcium: 8.7 mg/dL — ABNORMAL LOW (ref 8.9–10.3)
Chloride: 101 mmol/L (ref 101–111)
Creatinine, Ser: 1.44 mg/dL — ABNORMAL HIGH (ref 0.61–1.24)
GFR calc Af Amer: 57 mL/min — ABNORMAL LOW (ref >60)
GFR calc non Af Amer: 49 mL/min — ABNORMAL LOW (ref >60)
Glucose, Bld: 172 mg/dL — ABNORMAL HIGH (ref 65–99)
Potassium: 4.1 mmol/L (ref 3.5–5.1)
Sodium: 135 mmol/L (ref 135–145)
Total Bilirubin: 0.6 mg/dL (ref 0.3–1.2)
Total Protein: 7.8 g/dL (ref 6.5–8.1)

## 2015-07-04 LAB — CK: Total CK: 254 U/L (ref 49–397)

## 2015-07-04 LAB — RAPID URINE DRUG SCREEN, HOSP PERFORMED
Amphetamines: NOT DETECTED
Barbiturates: NOT DETECTED
Benzodiazepines: POSITIVE — AB
Cocaine: NOT DETECTED
Opiates: POSITIVE — AB
Tetrahydrocannabinol: POSITIVE — AB

## 2015-07-04 LAB — STREP PNEUMONIAE URINARY ANTIGEN: Strep Pneumo Urinary Antigen: NEGATIVE

## 2015-07-04 LAB — CBG MONITORING, ED: Glucose-Capillary: 201 mg/dL — ABNORMAL HIGH (ref 65–99)

## 2015-07-04 LAB — GLUCOSE, CAPILLARY
Glucose-Capillary: 156 mg/dL — ABNORMAL HIGH (ref 65–99)
Glucose-Capillary: 139 mg/dL — ABNORMAL HIGH (ref 65–99)

## 2015-07-04 LAB — I-STAT CG4 LACTIC ACID, ED: Lactic Acid, Venous: 2.59 mmol/L (ref 0.5–2.0)

## 2015-07-04 LAB — VITAMIN B12: Vitamin B-12: 208 pg/mL (ref 180–914)

## 2015-07-04 LAB — LACTIC ACID, PLASMA: Lactic Acid, Venous: 0.9 mmol/L (ref 0.5–2.0)

## 2015-07-04 MED ORDER — DEXTROSE 5 % IV SOLN
1.0000 g | INTRAVENOUS | Status: DC
  Administered 2015-07-05 – 2015-07-07 (×3): 1 g via INTRAVENOUS
  Filled 2015-07-04 (×3): qty 10

## 2015-07-04 MED ORDER — SODIUM CHLORIDE 0.9 % IV BOLUS (SEPSIS)
1000.0000 mL | Freq: Once | INTRAVENOUS | Status: AC
  Administered 2015-07-04: 1000 mL via INTRAVENOUS

## 2015-07-04 MED ORDER — DEXTROSE 5 % IV SOLN
500.0000 mg | Freq: Once | INTRAVENOUS | Status: AC
  Administered 2015-07-04: 500 mg via INTRAVENOUS
  Filled 2015-07-04: qty 500

## 2015-07-04 MED ORDER — ESCITALOPRAM OXALATE 20 MG PO TABS
20.0000 mg | ORAL_TABLET | Freq: Two times a day (BID) | ORAL | Status: DC
  Administered 2015-07-04 – 2015-07-07 (×7): 20 mg via ORAL
  Filled 2015-07-04 (×2): qty 1
  Filled 2015-07-04: qty 2
  Filled 2015-07-04 (×4): qty 1

## 2015-07-04 MED ORDER — ONDANSETRON HCL 4 MG/2ML IJ SOLN: 4.0000 mg | Freq: Four times a day (QID) | INTRAMUSCULAR | Status: DC | PRN

## 2015-07-04 MED ORDER — AZITHROMYCIN 500 MG PO TABS
500.0000 mg | ORAL_TABLET | ORAL | Status: DC
  Administered 2015-07-05 – 2015-07-07 (×3): 500 mg via ORAL
  Filled 2015-07-04 (×3): qty 1

## 2015-07-04 MED ORDER — HYDROCODONE-ACETAMINOPHEN 5-325 MG PO TABS
1.0000 | ORAL_TABLET | ORAL | Status: DC | PRN
  Administered 2015-07-04 – 2015-07-05 (×2): 2 via ORAL
  Administered 2015-07-05: 1 via ORAL
  Filled 2015-07-04 (×3): qty 2

## 2015-07-04 MED ORDER — ENOXAPARIN SODIUM 40 MG/0.4ML ~~LOC~~ SOLN
40.0000 mg | SUBCUTANEOUS | Status: DC
  Administered 2015-07-05 – 2015-07-07 (×3): 40 mg via SUBCUTANEOUS
  Filled 2015-07-04 (×3): qty 0.4

## 2015-07-04 MED ORDER — CLONAZEPAM 1 MG PO TABS
1.0000 mg | ORAL_TABLET | Freq: Every day | ORAL | Status: DC
  Administered 2015-07-04 – 2015-07-07 (×4): 1 mg via ORAL
  Filled 2015-07-04: qty 2
  Filled 2015-07-04 (×3): qty 1

## 2015-07-04 MED ORDER — INSULIN ASPART 100 UNIT/ML ~~LOC~~ SOLN: 0.0000 [IU] | Freq: Every day | SUBCUTANEOUS | Status: DC

## 2015-07-04 MED ORDER — SODIUM CHLORIDE 0.9 % IV SOLN
INTRAVENOUS | Status: DC
  Administered 2015-07-04 (×2): via INTRAVENOUS

## 2015-07-04 MED ORDER — DEXTROSE 5 % IV SOLN
1.0000 g | Freq: Once | INTRAVENOUS | Status: AC
  Administered 2015-07-04: 1 g via INTRAVENOUS
  Filled 2015-07-04: qty 10

## 2015-07-04 MED ORDER — QUETIAPINE FUMARATE 50 MG PO TABS
100.0000 mg | ORAL_TABLET | Freq: Every day | ORAL | Status: DC
  Administered 2015-07-04 – 2015-07-06 (×3): 100 mg via ORAL
  Filled 2015-07-04 (×3): qty 2

## 2015-07-04 MED ORDER — SODIUM CHLORIDE 0.9 % IV SOLN: INTRAVENOUS | Status: DC

## 2015-07-04 MED ORDER — ALBUTEROL SULFATE (2.5 MG/3ML) 0.083% IN NEBU
2.5000 mg | INHALATION_SOLUTION | Freq: Four times a day (QID) | RESPIRATORY_TRACT | Status: DC
  Administered 2015-07-05 (×3): 2.5 mg via RESPIRATORY_TRACT
  Filled 2015-07-04 (×3): qty 3

## 2015-07-04 MED ORDER — BISACODYL 10 MG RE SUPP: 10.0000 mg | Freq: Every day | RECTAL | Status: DC | PRN

## 2015-07-04 MED ORDER — ACETAMINOPHEN 650 MG RE SUPP: 650.0000 mg | Freq: Four times a day (QID) | RECTAL | Status: DC | PRN

## 2015-07-04 MED ORDER — INSULIN ASPART 100 UNIT/ML ~~LOC~~ SOLN
0.0000 [IU] | Freq: Three times a day (TID) | SUBCUTANEOUS | Status: DC
  Administered 2015-07-04: 3 [IU] via SUBCUTANEOUS
  Administered 2015-07-05: 2 [IU] via SUBCUTANEOUS

## 2015-07-04 MED ORDER — ACETAMINOPHEN 325 MG PO TABS: 650.0000 mg | ORAL_TABLET | Freq: Four times a day (QID) | ORAL | Status: DC | PRN

## 2015-07-04 MED ORDER — DEXTROSE 5 % IV SOLN: 1.0000 g | INTRAVENOUS | Status: DC

## 2015-07-04 MED ORDER — AZITHROMYCIN 250 MG PO TABS: 500.0000 mg | ORAL_TABLET | ORAL | Status: DC

## 2015-07-04 MED ORDER — ALBUTEROL SULFATE (2.5 MG/3ML) 0.083% IN NEBU
2.5000 mg | INHALATION_SOLUTION | RESPIRATORY_TRACT | Status: DC
  Administered 2015-07-04 (×2): 2.5 mg via RESPIRATORY_TRACT
  Filled 2015-07-04: qty 3

## 2015-07-04 MED ORDER — ONDANSETRON HCL 4 MG PO TABS
4.0000 mg | ORAL_TABLET | Freq: Four times a day (QID) | ORAL | Status: DC | PRN
  Administered 2015-07-05: 4 mg via ORAL
  Filled 2015-07-04: qty 1

## 2015-07-04 MED ORDER — ALBUTEROL SULFATE (2.5 MG/3ML) 0.083% IN NEBU
2.5000 mg | INHALATION_SOLUTION | Freq: Four times a day (QID) | RESPIRATORY_TRACT | Status: DC | PRN
  Filled 2015-07-04: qty 3

## 2015-07-04 MED ORDER — ALLOPURINOL 100 MG PO TABS
100.0000 mg | ORAL_TABLET | Freq: Every day | ORAL | Status: DC
  Administered 2015-07-04 – 2015-07-07 (×4): 100 mg via ORAL
  Filled 2015-07-04 (×4): qty 1

## 2015-07-04 MED ORDER — PREDNISONE 20 MG PO TABS
40.0000 mg | ORAL_TABLET | Freq: Once | ORAL | Status: AC
  Administered 2015-07-04: 40 mg via ORAL
  Filled 2015-07-04: qty 2

## 2015-07-04 NOTE — ED Notes (Signed)
MD at bedside. 

## 2015-07-04 NOTE — ED Notes (Signed)
Phlebotomy made aware for repeat lactic acid lab draw
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
## 2015-07-04 NOTE — ED Notes (Signed)
Patient transported to CT 

## 2015-07-04 NOTE — ED Notes (Signed)
Per patient's sister, patient has a history of medication abuse, and unsure if he took additional medication or not today.   Patient smells of urine at triage.

## 2015-07-04 NOTE — ED Notes (Signed)
Pt  Went to dr office today and was sent to er for further tests ? Pneumonia, pt is confused unsteady on feet with slurred speech unknown how long has been this way . States his chest hurts since sat  Started as back pain
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
## 2015-07-05 ENCOUNTER — Inpatient Hospital Stay (HOSPITAL_COMMUNITY): Payer: Medicare HMO

## 2015-07-05 DIAGNOSIS — I10 Essential (primary) hypertension: Secondary | ICD-10-CM

## 2015-07-05 DIAGNOSIS — G8929 Other chronic pain: Secondary | ICD-10-CM

## 2015-07-05 DIAGNOSIS — K219 Gastro-esophageal reflux disease without esophagitis: Secondary | ICD-10-CM

## 2015-07-05 DIAGNOSIS — R739 Hyperglycemia, unspecified: Secondary | ICD-10-CM

## 2015-07-05 LAB — RPR: RPR Ser Ql: NONREACTIVE

## 2015-07-05 LAB — GLUCOSE, CAPILLARY
Glucose-Capillary: 135 mg/dL — ABNORMAL HIGH (ref 65–99)
Glucose-Capillary: 113 mg/dL — ABNORMAL HIGH (ref 65–99)
Glucose-Capillary: 110 mg/dL — ABNORMAL HIGH (ref 65–99)
Glucose-Capillary: 106 mg/dL — ABNORMAL HIGH (ref 65–99)

## 2015-07-05 LAB — FOLATE RBC
Folate, Hemolysate: 231.7 ng/mL
Folate, RBC: 597 ng/mL (ref >498)
Hematocrit: 38.8 % (ref 37.5–51.0)

## 2015-07-05 LAB — URINE CULTURE: Culture: NO GROWTH

## 2015-07-05 LAB — CBC
HCT: 39.1 % (ref 39.0–52.0)
Hemoglobin: 12.5 g/dL — ABNORMAL LOW (ref 13.0–17.0)
MCH: 28.7 pg (ref 26.0–34.0)
MCHC: 32 g/dL (ref 30.0–36.0)
MCV: 89.9 fL (ref 78.0–100.0)
Platelets: 192 10*3/uL (ref 150–400)
RBC: 4.35 MIL/uL (ref 4.22–5.81)
RDW: 15.1 % (ref 11.5–15.5)
WBC: 12.6 10*3/uL — ABNORMAL HIGH (ref 4.0–10.5)

## 2015-07-05 LAB — BASIC METABOLIC PANEL
Anion gap: 8 (ref 5–15)
BUN: 16 mg/dL (ref 6–20)
CO2: 29 mmol/L (ref 22–32)
Calcium: 8.7 mg/dL — ABNORMAL LOW (ref 8.9–10.3)
Chloride: 104 mmol/L (ref 101–111)
Creatinine, Ser: 0.92 mg/dL (ref 0.61–1.24)
GFR calc Af Amer: >60 mL/min (ref >60)
GFR calc non Af Amer: >60 mL/min (ref >60)
Glucose, Bld: 102 mg/dL — ABNORMAL HIGH (ref 65–99)
Potassium: 3.5 mmol/L (ref 3.5–5.1)
Sodium: 141 mmol/L (ref 135–145)

## 2015-07-05 LAB — HEMOGLOBIN A1C
Hgb A1c MFr Bld: 6.4 % — ABNORMAL HIGH (ref 4.8–5.6)
Mean Plasma Glucose: 137 mg/dL

## 2015-07-05 MED ORDER — ALBUTEROL SULFATE (2.5 MG/3ML) 0.083% IN NEBU: 2.5000 mg | INHALATION_SOLUTION | RESPIRATORY_TRACT | Status: DC | PRN

## 2015-07-05 MED ORDER — CETYLPYRIDINIUM CHLORIDE 0.05 % MT LIQD
7.0000 mL | Freq: Two times a day (BID) | OROMUCOSAL | Status: DC
  Administered 2015-07-05 – 2015-07-07 (×5): 7 mL via OROMUCOSAL

## 2015-07-05 MED ORDER — HYDROCODONE-ACETAMINOPHEN 5-325 MG PO TABS
1.0000 | ORAL_TABLET | Freq: Four times a day (QID) | ORAL | Status: DC | PRN
  Administered 2015-07-05 – 2015-07-07 (×6): 1 via ORAL
  Filled 2015-07-05 (×6): qty 1

## 2015-07-05 MED ORDER — FUROSEMIDE 20 MG PO TABS
20.0000 mg | ORAL_TABLET | Freq: Once | ORAL | Status: AC
  Administered 2015-07-05: 20 mg via ORAL
  Filled 2015-07-05: qty 1

## 2015-07-05 NOTE — Care Management Important Message (Signed)
Important Message  Patient Details  Name: Max SereneJerry D Tigue MRN: [id number] Date of Birth: Sep 02, 1950   Medicare Important Message Given:  Yes    Bernadette HoitShoffner, Cherye Gaertner Coleman 07/05/2015, 8:29 AM
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
## 2015-07-05 NOTE — Consult Note (Signed)
SLP Note  Patient Details Name: Vicente SereneJerry D Hanahan MRN: [id number] DOB: 04-Mar-1950   Pt was seen for MBS. Mild pharyngeal dysphagia with trace intermittent flash penetration of thin via cup and straw when large/consecutive boluses are taken. Pt reports globus sensation, and feels he needs to have his esophagus stretched. Recommend GI consult for this issue. Report under Chart Review/Imaging.  Leigh AuroraBueche, Talli Kimmer Brown 07/05/2015, 1:53 PM  Marriah Sanderlin B. Murvin NatalBueche, [facility] LPMSP, CCC-SLP [phone]

## 2015-07-06 ENCOUNTER — Inpatient Hospital Stay (HOSPITAL_COMMUNITY): Payer: Medicare HMO

## 2015-07-06 DIAGNOSIS — J9621 Acute and chronic respiratory failure with hypoxia: Secondary | ICD-10-CM

## 2015-07-06 LAB — CBC
HCT: 39.5 % (ref 39.0–52.0)
Hemoglobin: 12.7 g/dL — ABNORMAL LOW (ref 13.0–17.0)
MCH: 29.1 pg (ref 26.0–34.0)
MCHC: 32.2 g/dL (ref 30.0–36.0)
MCV: 90.4 fL (ref 78.0–100.0)
Platelets: 217 10*3/uL (ref 150–400)
RBC: 4.37 MIL/uL (ref 4.22–5.81)
RDW: 15.2 % (ref 11.5–15.5)
WBC: 8.8 10*3/uL (ref 4.0–10.5)

## 2015-07-06 LAB — GLUCOSE, CAPILLARY
Glucose-Capillary: 106 mg/dL — ABNORMAL HIGH (ref 65–99)
Glucose-Capillary: 104 mg/dL — ABNORMAL HIGH (ref 65–99)
Glucose-Capillary: 113 mg/dL — ABNORMAL HIGH (ref 65–99)
Glucose-Capillary: 104 mg/dL — ABNORMAL HIGH (ref 65–99)

## 2015-07-06 LAB — BASIC METABOLIC PANEL
Anion gap: 11 (ref 5–15)
BUN: 13 mg/dL (ref 6–20)
CO2: 29 mmol/L (ref 22–32)
Calcium: 8.7 mg/dL — ABNORMAL LOW (ref 8.9–10.3)
Chloride: 100 mmol/L — ABNORMAL LOW (ref 101–111)
Creatinine, Ser: 0.89 mg/dL (ref 0.61–1.24)
GFR calc Af Amer: >60 mL/min (ref >60)
GFR calc non Af Amer: >60 mL/min (ref >60)
Glucose, Bld: 99 mg/dL (ref 65–99)
Potassium: 3.3 mmol/L — ABNORMAL LOW (ref 3.5–5.1)
Sodium: 140 mmol/L (ref 135–145)

## 2015-07-06 LAB — C DIFFICILE QUICK SCREEN W PCR REFLEX
C Diff antigen: POSITIVE — AB
C Diff toxin: NEGATIVE
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
## 2015-07-07 LAB — GLUCOSE, CAPILLARY
Glucose-Capillary: 88 mg/dL (ref 65–99)
Glucose-Capillary: 98 mg/dL (ref 65–99)
Glucose-Capillary: 119 mg/dL — ABNORMAL HIGH (ref 65–99)

## 2015-07-07 LAB — CLOSTRIDIUM DIFFICILE BY PCR: Toxigenic C. Difficile by PCR: POSITIVE — AB

## 2015-07-07 MED ORDER — CEFDINIR 300 MG PO CAPS: 300.0000 mg | ORAL_CAPSULE | Freq: Two times a day (BID) | ORAL | Status: DC

## 2015-07-07 MED ORDER — IBUPROFEN 200 MG PO TABS
200.0000 mg | ORAL_TABLET | Freq: Four times a day (QID) | ORAL | Status: DC | PRN
  Administered 2015-07-07: 200 mg via ORAL
  Filled 2015-07-07: qty 1

## 2015-07-07 NOTE — Care Management Important Message (Signed)
Important Message  Patient Details  Name: [name] MRN: [id number] Date of Birth: 01-03-1951   Medicare Important Message Given:  Yes    Dim Meisinger, Annamarie Majorheryl U, RN 07/07/2015, 12:42 PM
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
## 2015-07-07 NOTE — Progress Notes (Signed)
The patient's barium swallow showed a distal esophageal stricture which impeded the passage of a 13 mm barium tablet. I spoke to him about endoscopy with dilatation. He would prefer going home and following up in the office in having this done as an outpatient. He will call the office once he was discharged to set this up.

## 2015-07-08 SURGERY — EGD (ESOPHAGOGASTRODUODENOSCOPY)
Anesthesia: Monitor Anesthesia Care

## 2015-07-09 LAB — CULTURE, BLOOD (ROUTINE X 2)
Culture: NO GROWTH
Culture: NO GROWTH

## 2015-07-14 ENCOUNTER — Telehealth: Payer: Self-pay | Admitting: Orthopaedic Surgery

## 2015-07-14 MED ORDER — OXYCODONE-ACETAMINOPHEN 5-325 MG PO TABS: 1.0000 | ORAL_TABLET | ORAL | Status: DC | PRN

## 2015-07-14 NOTE — Telephone Encounter (Signed)
Rx done. 

## 2015-07-14 NOTE — Telephone Encounter (Signed)
Oxycodone-Acetaminophen 5/325mg  Qty 100 Tablets °

## 2015-08-15 ENCOUNTER — Telehealth: Payer: Self-pay | Admitting: Orthopaedic Surgery

## 2015-08-15 NOTE — Telephone Encounter (Signed)
Oxycodone-Acetaminophen 5/325mg   Qty 100 Tablets

## 2015-08-16 MED ORDER — OXYCODONE-ACETAMINOPHEN 5-325 MG PO TABS: 1.0000 | ORAL_TABLET | ORAL | Status: DC | PRN

## 2015-08-16 NOTE — Telephone Encounter (Signed)
Rx done. 

## 2015-09-15 ENCOUNTER — Ambulatory Visit: Payer: 59 | Admitting: Orthopaedic Surgery

## 2015-09-20 ENCOUNTER — Encounter: Payer: Self-pay | Admitting: Orthopaedic Surgery

## 2015-09-20 ENCOUNTER — Ambulatory Visit (INDEPENDENT_AMBULATORY_CARE_PROVIDER_SITE_OTHER): Payer: Medicare HMO | Admitting: Orthopaedic Surgery

## 2015-09-20 VITALS — BP 126/77 | HR 75 | Ht 70.0 in | Wt 243.0 lb

## 2015-09-20 DIAGNOSIS — M545 Low back pain, unspecified: Secondary | ICD-10-CM

## 2015-09-20 DIAGNOSIS — M25562 Pain in left knee: Secondary | ICD-10-CM

## 2015-09-20 DIAGNOSIS — G8929 Other chronic pain: Secondary | ICD-10-CM | POA: Diagnosis not present

## 2015-09-20 MED ORDER — OXYCODONE-ACETAMINOPHEN 5-325 MG PO TABS: 1.0000 | ORAL_TABLET | ORAL | 0 refills | Status: DC | PRN
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
## 2015-10-18 ENCOUNTER — Encounter: Payer: Self-pay | Admitting: Orthopaedic Surgery

## 2015-10-18 ENCOUNTER — Ambulatory Visit (INDEPENDENT_AMBULATORY_CARE_PROVIDER_SITE_OTHER): Payer: Medicare HMO | Admitting: Orthopaedic Surgery

## 2015-10-18 VITALS — BP 125/70 | HR 79 | Ht 70.0 in | Wt 244.0 lb

## 2015-10-18 DIAGNOSIS — M545 Low back pain, unspecified: Secondary | ICD-10-CM

## 2015-10-18 DIAGNOSIS — F17210 Nicotine dependence, cigarettes, uncomplicated: Secondary | ICD-10-CM | POA: Diagnosis not present

## 2015-10-18 DIAGNOSIS — G8929 Other chronic pain: Secondary | ICD-10-CM | POA: Diagnosis not present

## 2015-10-18 DIAGNOSIS — M25562 Pain in left knee: Secondary | ICD-10-CM

## 2015-10-18 MED ORDER — OXYCODONE-ACETAMINOPHEN 5-325 MG PO TABS: 1.0000 | ORAL_TABLET | Freq: Four times a day (QID) | ORAL | 0 refills | Status: DC | PRN

## 2015-10-18 NOTE — Patient Instructions (Signed)
Smoking Cessation, Tips for Success If you are ready to quit smoking, congratulations! You have chosen to help yourself be healthier. Cigarettes bring nicotine, tar, carbon monoxide, and other irritants into your body. Your lungs, heart, and blood vessels will be able to work better without these poisons. There are many different ways to quit smoking. Nicotine gum, nicotine patches, a nicotine inhaler, or nicotine nasal spray can help with physical craving. Hypnosis, support groups, and medicines help break the habit of smoking. WHAT THINGS CAN I DO TO MAKE QUITTING EASIER?  Here are some tips to help you quit for good:  Pick a date when you will quit smoking completely. Tell all of your friends and family about your plan to quit on that date.  Do not try to slowly cut down on the number of cigarettes you are smoking. Pick a quit date and quit smoking completely starting on that day.  Throw away all cigarettes.   Clean and remove all ashtrays from your home, work, and car.  On a card, write down your reasons for quitting. Carry the card with you and read it when you get the urge to smoke.  Cleanse your body of nicotine. Drink enough water and fluids to keep your urine clear or pale yellow. Do this after quitting to flush the nicotine from your body.  Learn to predict your moods. Do not let a bad situation be your excuse to have a cigarette. Some situations in your life might tempt you into wanting a cigarette.  Never have "just one" cigarette. It leads to wanting another and another. Remind yourself of your decision to quit.  Change habits associated with smoking. If you smoked while driving or when feeling stressed, try other activities to replace smoking. Stand up when drinking your coffee. Brush your teeth after eating. Sit in a different chair when you read the paper. Avoid alcohol while trying to quit, and try to drink fewer caffeinated beverages. Alcohol and caffeine may urge you to  smoke.  Avoid foods and drinks that can trigger a desire to smoke, such as sugary or spicy foods and alcohol.  Ask people who smoke not to smoke around you.  Have something planned to do right after eating or having a cup of coffee. For example, plan to take a walk or exercise.  Try a relaxation exercise to calm you down and decrease your stress. Remember, you may be tense and nervous for the first 2 weeks after you quit, but this will pass.  Find new activities to keep your hands busy. Play with a pen, coin, or rubber band. Doodle or draw things on paper.  Brush your teeth right after eating. This will help cut down on the craving for the taste of tobacco after meals. You can also try mouthwash.   Use oral substitutes in place of cigarettes. Try using lemon drops, carrots, cinnamon sticks, or chewing gum. Keep them handy so they are available when you have the urge to smoke.  When you have the urge to smoke, try deep breathing.  Designate your home as a nonsmoking area.  If you are a heavy smoker, ask your health care provider about a prescription for nicotine chewing gum. It can ease your withdrawal from nicotine.  Reward yourself. Set aside the cigarette money you save and buy yourself something nice.  Look for support from others. Join a support group or smoking cessation program. Ask someone at home or at work to help you with your plan   to quit smoking.  Always ask yourself, "Do I need this cigarette or is this just a reflex?" Tell yourself, "Today, I choose not to smoke," or "I do not want to smoke." You are reminding yourself of your decision to quit.  Do not replace cigarette smoking with electronic cigarettes (commonly called e-cigarettes). The safety of e-cigarettes is unknown, and some may contain harmful chemicals.  If you relapse, do not give up! Plan ahead and think about what you will do the next time you get the urge to smoke. HOW WILL I FEEL WHEN I QUIT SMOKING? You  may have symptoms of withdrawal because your body is used to nicotine (the addictive substance in cigarettes). You may crave cigarettes, be irritable, feel very hungry, cough often, get headaches, or have difficulty concentrating. The withdrawal symptoms are only temporary. They are strongest when you first quit but will go away within 10-14 days. When withdrawal symptoms occur, stay in control. Think about your reasons for quitting. Remind yourself that these are signs that your body is healing and getting used to being without cigarettes. Remember that withdrawal symptoms are easier to treat than the major diseases that smoking can cause.  Even after the withdrawal is over, expect periodic urges to smoke. However, these cravings are generally short lived and will go away whether you smoke or not. Do not smoke! WHAT RESOURCES ARE AVAILABLE TO HELP ME QUIT SMOKING? Your health care provider can direct you to community resources or hospitals for support, which may include:  Group support.  Education.  Hypnosis.  Therapy.   This information is not intended to replace advice given to you by your health care provider. Make sure you discuss any questions you have with your health care provider.   Document Released: 10/14/2003 Document Revised: 02/05/2014 Document Reviewed: 07/03/2012 Elsevier Interactive Patient Education 2016 [employer]  
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
## 2015-10-20 ENCOUNTER — Encounter (HOSPITAL_COMMUNITY): Payer: Self-pay | Admitting: *Deleted

## 2015-10-20 NOTE — Progress Notes (Signed)
Dr c Jean Rosenthaljackson made aware of 07-04-15 chest xray results do not needs to repeat chest xray results day of procedure 10-26-15 per dr Jean Rosenthaljackson

## 2015-10-21 ENCOUNTER — Other Ambulatory Visit: Payer: Self-pay | Admitting: Gastroenterology

## 2015-10-26 ENCOUNTER — Encounter (HOSPITAL_COMMUNITY)
Admission: RE | Disposition: A | Discharge status: Home / Self Care | Payer: Self-pay | Source: Ambulatory Visit | Attending: Gastroenterology

## 2015-10-26 ENCOUNTER — Ambulatory Visit (HOSPITAL_COMMUNITY)
Admission: RE | Admit: 2015-10-26 | Discharge: 2015-10-26 | Disposition: A | Discharge status: Home / Self Care | Payer: Medicare HMO | Source: Ambulatory Visit | Attending: Gastroenterology | Admitting: Gastroenterology

## 2015-10-26 ENCOUNTER — Ambulatory Visit (HOSPITAL_COMMUNITY): Payer: Medicare HMO | Admitting: Anesthesiology

## 2015-10-26 ENCOUNTER — Encounter (HOSPITAL_COMMUNITY): Payer: Self-pay

## 2015-10-26 DIAGNOSIS — Z1211 Encounter for screening for malignant neoplasm of colon: Secondary | ICD-10-CM | POA: Diagnosis not present

## 2015-10-26 DIAGNOSIS — R1310 Dysphagia, unspecified: Secondary | ICD-10-CM | POA: Diagnosis not present

## 2015-10-26 DIAGNOSIS — Z792 Long term (current) use of antibiotics: Secondary | ICD-10-CM | POA: Insufficient documentation

## 2015-10-26 DIAGNOSIS — R933 Abnormal findings on diagnostic imaging of other parts of digestive tract: Secondary | ICD-10-CM | POA: Diagnosis not present

## 2015-10-26 DIAGNOSIS — G4730 Sleep apnea, unspecified: Secondary | ICD-10-CM | POA: Insufficient documentation

## 2015-10-26 DIAGNOSIS — Z96651 Presence of right artificial knee joint: Secondary | ICD-10-CM | POA: Diagnosis not present

## 2015-10-26 DIAGNOSIS — K219 Gastro-esophageal reflux disease without esophagitis: Secondary | ICD-10-CM | POA: Insufficient documentation

## 2015-10-26 DIAGNOSIS — F329 Major depressive disorder, single episode, unspecified: Secondary | ICD-10-CM | POA: Insufficient documentation

## 2015-10-26 DIAGNOSIS — Z860100 Personal history of colon polyps, unspecified: Secondary | ICD-10-CM

## 2015-10-26 DIAGNOSIS — Z79899 Other long term (current) drug therapy: Secondary | ICD-10-CM | POA: Insufficient documentation

## 2015-10-26 DIAGNOSIS — K2900 Acute gastritis without bleeding: Secondary | ICD-10-CM | POA: Diagnosis not present

## 2015-10-26 DIAGNOSIS — K449 Diaphragmatic hernia without obstruction or gangrene: Secondary | ICD-10-CM | POA: Diagnosis not present

## 2015-10-26 DIAGNOSIS — F17210 Nicotine dependence, cigarettes, uncomplicated: Secondary | ICD-10-CM | POA: Insufficient documentation

## 2015-10-26 DIAGNOSIS — I10 Essential (primary) hypertension: Secondary | ICD-10-CM | POA: Diagnosis not present

## 2015-10-26 DIAGNOSIS — K640 First degree hemorrhoids: Secondary | ICD-10-CM | POA: Diagnosis not present

## 2015-10-26 DIAGNOSIS — K228 Other specified diseases of esophagus: Secondary | ICD-10-CM | POA: Insufficient documentation

## 2015-10-26 DIAGNOSIS — Z9981 Dependence on supplemental oxygen: Secondary | ICD-10-CM | POA: Insufficient documentation

## 2015-10-26 DIAGNOSIS — J449 Chronic obstructive pulmonary disease, unspecified: Secondary | ICD-10-CM | POA: Insufficient documentation

## 2015-10-26 HISTORY — PX: COLONOSCOPY: SHX5424

## 2015-10-26 HISTORY — DX: Gastro-esophageal reflux disease without esophagitis: K21.9

## 2015-10-26 HISTORY — DX: Inflammatory liver disease, unspecified: K75.9

## 2015-10-26 HISTORY — PX: ESOPHAGOGASTRODUODENOSCOPY: SHX5428

## 2015-10-26 SURGERY — COLONOSCOPY
Anesthesia: Monitor Anesthesia Care

## 2015-10-26 MED ORDER — SODIUM CHLORIDE 0.9 % IV SOLN: INTRAVENOUS | Status: DC

## 2015-10-26 MED ORDER — PROPOFOL 10 MG/ML IV BOLUS
INTRAVENOUS | Status: AC
  Filled 2015-10-26: qty 40

## 2015-10-26 MED ORDER — PROPOFOL 10 MG/ML IV BOLUS
INTRAVENOUS | Status: AC
  Filled 2015-10-26: qty 20

## 2015-10-26 MED ORDER — LACTATED RINGERS IV SOLN
INTRAVENOUS | Status: DC
Start: 2015-10-26 — End: 2015-10-26
  Administered 2015-10-26: 09:00:00 via INTRAVENOUS

## 2015-10-26 MED ORDER — LACTATED RINGERS IV SOLN
INTRAVENOUS | Status: DC | PRN
  Administered 2015-10-26: 09:00:00 via INTRAVENOUS

## 2015-10-26 MED ORDER — PROPOFOL 10 MG/ML IV BOLUS
INTRAVENOUS | Status: DC | PRN
  Administered 2015-10-26: 40 mg via INTRAVENOUS
  Administered 2015-10-26: 20 mg via INTRAVENOUS
  Administered 2015-10-26 (×8): 40 mg via INTRAVENOUS
  Administered 2015-10-26 (×2): 20 mg via INTRAVENOUS
  Administered 2015-10-26: 40 mg via INTRAVENOUS

## 2015-10-26 NOTE — Discharge Instructions (Signed)

## 2015-10-26 NOTE — Op Note (Addendum)
[facility] Patient Name: Max [name] Procedure Date: 10/26/2015 MRN: [id number] Attending MD: Shirley Friar , MD Date of Birth: 1950/09/12 CSN: [id number] Age: 65 Admit Type: Outpatient Procedure:                Colonoscopy Indications:              High risk colon cancer surveillance: Personal                            history of colonic polyps, Last colonoscopy:                            November 2012 Providers:                Shirley Friar, MD, Dwain Sarna, RN, Beryle Beams, Technician, Phillips Grout, CRNA Referring MD:              Medicines:                Propofol per Anesthesia, Monitored Anesthesia Care Complications:            No immediate complications. Estimated Blood Loss:     Estimated blood loss: none. Procedure:                Pre-Anesthesia Assessment:                           - Prior to the procedure, a History and Physical                            was performed, and patient medications and                            allergies were reviewed. The patient's tolerance of                            previous anesthesia was also reviewed. The risks                            and benefits of the procedure and the sedation                            options and risks were discussed with the patient.                            All questions were answered, and informed consent                            was obtained. Prior Anticoagulants: The patient has                            taken no previous anticoagulant or antiplatelet  agents. ASA Grade Assessment: III - A patient with                            severe systemic disease. After reviewing the risks                            and benefits, the patient was deemed in                            satisfactory condition to undergo the procedure.                           After obtaining informed consent, the colonoscope          was passed under direct vision. Throughout the                            procedure, the patient's blood pressure, pulse, and                            oxygen saturations were monitored continuously. The                            EC-[id number] ([id number] scope was introduced through                            the anus and advanced to the the cecum, identified                            by appendiceal orifice and ileocecal valve. The                            colonoscopy was somewhat difficult due to a                            tortuous colon. Successful completion of the                            procedure was aided by straightening and shortening                            the scope to obtain bowel loop reduction. The                            patient tolerated the procedure well. The quality                            of the bowel preparation was adequate and good. The                            ileocecal valve, appendiceal orifice, and rectum                            were photographed. Scope In: 10:27:47 AM  Scope Out: 10:41:46 AM Scope Withdrawal Time: 0 hours 10 minutes 8 seconds  Total Procedure Duration: 0 hours 13 minutes 59 seconds  Findings:      The perianal and digital rectal examinations were normal.      Internal hemorrhoids were found during retroflexion. The hemorrhoids       were medium-sized and Grade I (internal hemorrhoids that do not       prolapse).      The exam was otherwise normal throughout the examined colon. Impression:               - Internal hemorrhoids.                           - No specimens collected. Moderate Sedation:      N/A- Per Anesthesia Care Recommendation:           - Repeat colonoscopy in 10 years for screening                            purposes.                           - Resume previous diet.                           - Continue present medications.                           - Patient has a contact number available for                             emergencies. The signs and symptoms of potential                            delayed complications were discussed with the                            patient. Return to normal activities tomorrow.                            Written discharge instructions were provided to the                            patient.                           - Written discharge instructions were provided to                            the patient.                           - The signs and symptoms of potential delayed                            complications were discussed with the patient.                           -  Patient has a contact number available for                            emergencies. Procedure Code(s):        --- Professional ---                           [phone], Colonoscopy, flexible; diagnostic, including                            collection of specimen(s) by brushing or washing,                            when performed (separate procedure) Diagnosis Code(s):        --- Professional ---                           Z86.010, Personal history of colonic polyps                           K64.0, First degree hemorrhoids CPT copyright 2016 [employer]. All rights reserved. The codes documented in this report are preliminary and upon coder review may  be revised to meet current compliance requirements. Shirley Friar, MD 10/26/2015 10:55:56 AM This report has been signed electronically. Number of Addenda: 0

## 2015-10-26 NOTE — Op Note (Addendum)
[facility] Patient Name: Max BernJerry [name] Procedure Date: 10/26/2015 MRN: [id number] Attending MD: Shirley FriarVincent C Sherese Heyward , MD Date of Birth: 1950-05-12 CSN: [id number] Age: [age] Admit Type: Outpatient Procedure:                Upper GI endoscopy Indications:              Dysphagia, Abnormal cine-esophagram Providers:                Shirley FriarVincent C. May Manrique, MD, Dwain SarnaPatricia Ford, RN, Beryle BeamsJanie                            Billups, Technician, Phillips GroutJoanne Gray, CRNA Referring MD:              Medicines:                Propofol per Anesthesia, Monitored Anesthesia Care Complications:            No immediate complications. Estimated Blood Loss:     Estimated blood loss: none. Procedure:                Pre-Anesthesia Assessment:                           - Prior to the procedure, a History and Physical                            was performed, and patient medications and                            allergies were reviewed. The patient's tolerance of                            previous anesthesia was also reviewed. The risks                            and benefits of the procedure and the sedation                            options and risks were discussed with the patient.                            All questions were answered, and informed consent                            was obtained. Prior Anticoagulants: The patient has                            taken no previous anticoagulant or antiplatelet                            agents. ASA Grade Assessment: III - A patient with                            severe systemic disease. After reviewing the risks  and benefits, the patient was deemed in                            satisfactory condition to undergo the procedure.                           After obtaining informed consent, the endoscope was                            passed under direct vision. Throughout the                            procedure, the patient's blood  pressure, pulse, and                            oxygen saturations were monitored continuously. The                            Endoscope was introduced through the mouth, and                            advanced to the second part of duodenum. The upper                            GI endoscopy was accomplished without difficulty.                            The patient tolerated the procedure fairly well. Scope In: Scope Out: Findings:      The lumen of the middle third of the esophagus and lower third of the       esophagus was moderately dilated.      There is no endoscopic evidence of stricture in the entire esophagus.      A small hiatal hernia was present.      Segmental mild inflammation characterized by congestion (edema) and       linear erosions was found in the gastric antrum.      The examined duodenum was normal.      The Z-line was regular and was found 42 cm from the incisors. Impression:               - Dilation in the middle third of the esophagus and                            in the lower third of the esophagus.                           - Small hiatal hernia.                           - Acute gastritis.                           - Normal examined duodenum.                           -  Z-line regular, 42 cm from the incisors.                           - No specimens collected. Moderate Sedation:      N/A- Per Anesthesia Care Recommendation:           - Perform ambulatory esophageal manometry at                            appointment to be scheduled.                           - Resume previous diet.                           - Continue present medications.                           - Patient has a contact number available for                            emergencies. The signs and symptoms of potential                            delayed complications were discussed with the                            patient. Return to normal activities tomorrow.                             Written discharge instructions were provided to the                            patient.                           - Written discharge instructions were provided to                            the patient.                           - The signs and symptoms of potential delayed                            complications were discussed with the patient.                           - Patient has a contact number available for                            emergencies. Procedure Code(s):        --- Professional ---                           [phone], Esophagogastroduodenoscopy, flexible,  transoral; diagnostic, including collection of                            specimen(s) by brushing or washing, when performed                            (separate procedure) Diagnosis Code(s):        --- Professional ---                           R13.10, Dysphagia, unspecified                           K29.00, Acute gastritis without bleeding                           K44.9, Diaphragmatic hernia without obstruction or                            gangrene                           K22.8, Other specified diseases of esophagus                           R93.3, Abnormal findings on diagnostic imaging of                            other parts of digestive tract CPT copyright 2016 [employer]. All rights reserved. The codes documented in this report are preliminary and upon coder review may  be revised to meet current compliance requirements. Shirley Friar, MD 10/26/2015 10:50:45 AM This report has been signed electronically. Number of Addenda: 0

## 2015-10-26 NOTE — Transfer of Care (Signed)
Immediate Anesthesia Transfer of Care Note  Patient: Max [name]  Procedure(s) Performed: Procedure(s): COLONOSCOPY (N/A) ESOPHAGOGASTRODUODENOSCOPY (EGD) (N/A)  Patient Location: PACU and Endoscopy Unit  Anesthesia Type:MAC  Level of Consciousness: awake, oriented, patient cooperative, lethargic and responds to stimulation  Airway & Oxygen Therapy: Patient Spontanous Breathing and Patient connected to nasal cannula oxygen  Post-op Assessment: Report given to RN, Post -op Vital signs reviewed and stable and Patient moving all extremities  Post vital signs: Reviewed and stable  Last Vitals:  Vitals:   10/26/15 0900  BP: 140/79  Pulse: 79  Resp: 18  Temp: 36.6 C    Last Pain:  Vitals:   10/26/15 0900  TempSrc: Oral         Complications: No apparent anesthesia complications

## 2015-10-26 NOTE — Anesthesia Preprocedure Evaluation (Signed)
Anesthesia Evaluation  Patient identified by MRN, date of birth, ID band Patient awake    Reviewed: Allergy & Precautions, NPO status , Patient's Chart, lab work & pertinent test results  Airway Mallampati: II  TM Distance: >3 FB Neck ROM: Full    Dental  (+) Poor Dentition, Dental Advisory Given   Pulmonary sleep apnea , COPD,  COPD inhaler and oxygen dependent, Current Smoker,    breath sounds clear to auscultation       Cardiovascular hypertension, Pt. on medications  Rhythm:Regular Rate:Normal     Neuro/Psych PSYCHIATRIC DISORDERS Anxiety Depression  Neuromuscular disease    GI/Hepatic GERD  Medicated,  Endo/Other  negative endocrine ROS  Renal/GU Renal disease  negative genitourinary   Musculoskeletal negative musculoskeletal ROS (+)   Abdominal   Peds negative pediatric ROS (+)  Hematology negative hematology ROS (+)   Anesthesia Other Findings   Reproductive/Obstetrics negative OB ROS                             Anesthesia Physical Anesthesia Plan  ASA: III  Anesthesia Plan: MAC   Post-op Pain Management:    Induction: Intravenous  Airway Management Planned: Natural Airway  Additional Equipment:   Intra-op Plan:   Post-operative Plan:   Informed Consent: I have reviewed the patients History and Physical, chart, labs and discussed the procedure including the risks, benefits and alternatives for the proposed anesthesia with the patient or authorized representative who has indicated his/her understanding and acceptance.     Plan Discussed with: CRNA  Anesthesia Plan Comments:         Anesthesia Quick Evaluation

## 2015-10-26 NOTE — H&P (Signed)
Date of Initial H&P: 10/20/15  History reviewed, patient examined, no change in status, stable for surgery.  

## 2015-10-26 NOTE — Interval H&P Note (Signed)
History and Physical Interval Note:  10/26/2015 9:55 AM  Max [name]  has presented today for surgery, with the diagnosis of [place] of polyps; dysphagia and esophageal stricture  The various methods of treatment have been discussed with the patient and family. After consideration of risks, benefits and other options for treatment, the patient has consented to  Procedure(s): COLONOSCOPY (N/A) ESOPHAGOGASTRODUODENOSCOPY (EGD) (N/A) SAVORY DILATION (N/A) BALLOON DILATION (N/A) as a surgical intervention .  The patient's history has been reviewed, patient examined, no change in status, stable for surgery.  I have reviewed the patient's chart and labs.  Questions were answered to the patient's satisfaction.     Deshannon Hinchliffe C.

## 2015-10-26 NOTE — Anesthesia Postprocedure Evaluation (Signed)
Anesthesia Post Note  Patient: Nilda SimmerJerry D Larkey  Procedure(s) Performed: Procedure(s) (LRB): COLONOSCOPY (N/A) ESOPHAGOGASTRODUODENOSCOPY (EGD) (N/A)  Patient location during evaluation: PACU Anesthesia Type: MAC Level of consciousness: awake and alert Pain management: pain level controlled Vital Signs Assessment: post-procedure vital signs reviewed and stable Respiratory status: spontaneous breathing, nonlabored ventilation, respiratory function stable and patient connected to nasal cannula oxygen Cardiovascular status: stable and blood pressure returned to baseline Anesthetic complications: no    Last Vitals:  Vitals:   10/26/15 0900 10/26/15 1052  BP: 140/79 100/67  Pulse: 79 66  Resp: 18 18  Temp: 36.6 C     Last Pain:  Vitals:   10/26/15 0900  TempSrc: Oral                 Shelton SilvasKevin D Hollis

## 2015-10-27 ENCOUNTER — Encounter (HOSPITAL_COMMUNITY): Payer: Self-pay | Admitting: Gastroenterology

## 2015-11-17 ENCOUNTER — Encounter: Payer: Self-pay | Admitting: Orthopaedic Surgery

## 2015-11-17 ENCOUNTER — Ambulatory Visit (INDEPENDENT_AMBULATORY_CARE_PROVIDER_SITE_OTHER): Payer: Medicare HMO | Admitting: Orthopaedic Surgery

## 2015-11-17 VITALS — BP 77/47 | HR 69 | Temp 97.5°F | Resp 18 | Ht 71.0 in | Wt 245.0 lb

## 2015-11-17 DIAGNOSIS — G894 Chronic pain syndrome: Secondary | ICD-10-CM

## 2015-11-17 DIAGNOSIS — M545 Low back pain, unspecified: Secondary | ICD-10-CM

## 2015-11-17 DIAGNOSIS — G8929 Other chronic pain: Secondary | ICD-10-CM

## 2015-11-17 DIAGNOSIS — F17210 Nicotine dependence, cigarettes, uncomplicated: Secondary | ICD-10-CM

## 2015-11-17 MED ORDER — OXYCODONE-ACETAMINOPHEN 5-325 MG PO TABS: 1.0000 | ORAL_TABLET | Freq: Four times a day (QID) | ORAL | 0 refills | Status: DC | PRN
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
## 2015-12-07 ENCOUNTER — Ambulatory Visit (HOSPITAL_COMMUNITY)
Admission: RE | Admit: 2015-12-07 | Discharge: 2015-12-07 | Disposition: A | Discharge status: Home / Self Care | Payer: Medicare HMO | Source: Ambulatory Visit | Attending: Gastroenterology | Admitting: Gastroenterology

## 2015-12-07 ENCOUNTER — Encounter (HOSPITAL_COMMUNITY)
Admission: RE | Disposition: A | Discharge status: Home / Self Care | Payer: Self-pay | Source: Ambulatory Visit | Attending: Gastroenterology

## 2015-12-07 DIAGNOSIS — K220 Achalasia of cardia: Secondary | ICD-10-CM | POA: Insufficient documentation

## 2015-12-07 DIAGNOSIS — R1310 Dysphagia, unspecified: Secondary | ICD-10-CM | POA: Diagnosis present

## 2015-12-07 HISTORY — PX: ESOPHAGEAL MANOMETRY: SHX5429

## 2015-12-07 SURGERY — MANOMETRY, ESOPHAGUS

## 2015-12-07 MED ORDER — LIDOCAINE VISCOUS 2 % MT SOLN
OROMUCOSAL | Status: AC
  Filled 2015-12-07: qty 15

## 2015-12-07 SURGICAL SUPPLY — 2 items
FACESHIELD LNG OPTICON STERILE (SAFETY) IMPLANT
GLOVE BIO SURGEON STRL SZ8 (GLOVE) ×4 IMPLANT

## 2015-12-07 NOTE — Progress Notes (Signed)
Esophageal Manometry done per protocol. Pt tolerated well without complication. Report to be sent to Dr. Schooler. 

## 2015-12-08 ENCOUNTER — Encounter (HOSPITAL_COMMUNITY): Payer: Self-pay | Admitting: Gastroenterology

## 2015-12-15 ENCOUNTER — Telehealth: Payer: Self-pay | Admitting: Orthopaedic Surgery

## 2015-12-15 ENCOUNTER — Other Ambulatory Visit: Payer: Self-pay | Admitting: *Deleted

## 2015-12-15 MED ORDER — OXYCODONE-ACETAMINOPHEN 5-325 MG PO TABS: 1.0000 | ORAL_TABLET | Freq: Four times a day (QID) | ORAL | 0 refills | Status: DC | PRN

## 2015-12-15 NOTE — Telephone Encounter (Signed)
Oxycodone-Acetaminophen  5/325mg   Qty 100 Tablet  Take 1 tablet by mouth every 6 (six) hours as needed for moderate pain or severe pain (Must last 30 days. Do not drive or operate machinery while taking this medicine).

## 2016-01-17 ENCOUNTER — Ambulatory Visit (INDEPENDENT_AMBULATORY_CARE_PROVIDER_SITE_OTHER): Payer: Medicare HMO | Admitting: Orthopaedic Surgery

## 2016-01-17 ENCOUNTER — Encounter: Payer: Self-pay | Admitting: Orthopaedic Surgery

## 2016-01-17 VITALS — BP 135/84 | HR 94 | Temp 97.7°F | Ht 71.0 in | Wt 242.0 lb

## 2016-01-17 DIAGNOSIS — G8929 Other chronic pain: Secondary | ICD-10-CM

## 2016-01-17 DIAGNOSIS — F17210 Nicotine dependence, cigarettes, uncomplicated: Secondary | ICD-10-CM | POA: Diagnosis not present

## 2016-01-17 DIAGNOSIS — G894 Chronic pain syndrome: Secondary | ICD-10-CM | POA: Diagnosis not present

## 2016-01-17 DIAGNOSIS — M545 Low back pain, unspecified: Secondary | ICD-10-CM

## 2016-01-17 MED ORDER — OXYCODONE-ACETAMINOPHEN 5-325 MG PO TABS: 1.0000 | ORAL_TABLET | Freq: Four times a day (QID) | ORAL | 0 refills | Status: DC | PRN

## 2016-01-17 NOTE — Patient Instructions (Signed)
Steps to Quit Smoking Smoking tobacco can be harmful to your health and can affect almost every organ in your body. Smoking puts you, and those around you, at risk for developing many serious chronic diseases. Quitting smoking is difficult, but it is one of the best things that you can do for your health. It is never too late to quit. What are the benefits of quitting smoking? When you quit smoking, you lower your risk of developing serious diseases and conditions, such as:  Lung cancer or lung disease, such as COPD.  Heart disease.  Stroke.  Heart attack.  Infertility.  Osteoporosis and bone fractures.  Additionally, symptoms such as coughing, wheezing, and shortness of breath may get better when you quit. You may also find that you get sick less often because your body is stronger at fighting off colds and infections. If you are pregnant, quitting smoking can help to reduce your chances of having a baby of low birth weight. How do I get ready to quit? When you decide to quit smoking, create a plan to make sure that you are successful. Before you quit:  Pick a date to quit. Set a date within the next two weeks to give you time to prepare.  Write down the reasons why you are quitting. Keep this list in places where you will see it often, such as on your bathroom mirror or in your car or wallet.  Identify the people, places, things, and activities that make you want to smoke (triggers) and avoid them. Make sure to take these actions: ? Throw away all cigarettes at home, at work, and in your car. ? Throw away smoking accessories, such as ashtrays and lighters. ? Clean your car and make sure to empty the ashtray. ? Clean your home, including curtains and carpets.  Tell your family, friends, and coworkers that you are quitting. Support from your loved ones can make quitting easier.  Talk with your health care provider about your options for quitting smoking.  Find out what treatment  options are covered by your health insurance.  What strategies can I use to quit smoking? Talk with your healthcare provider about different strategies to quit smoking. Some strategies include:  Quitting smoking altogether instead of gradually lessening how much you smoke over a period of time. Research shows that quitting "cold turkey" is more successful than gradually quitting.  Attending in-person counseling to help you build problem-solving skills. You are more likely to have success in quitting if you attend several counseling sessions. Even short sessions of 10 minutes can be effective.  Finding resources and support systems that can help you to quit smoking and remain smoke-free after you quit. These resources are most helpful when you use them often. They can include: ? Online chats with a counselor. ? Telephone quitlines. ? Printed self-help materials. ? Support groups or group counseling. ? Text messaging programs. ? Mobile phone applications.  Taking medicines to help you quit smoking. (If you are pregnant or breastfeeding, talk with your health care provider first.) Some medicines contain nicotine and some do not. Both types of medicines help with cravings, but the medicines that include nicotine help to relieve withdrawal symptoms. Your health care provider may recommend: ? Nicotine patches, gum, or lozenges. ? Nicotine inhalers or sprays. ? Non-nicotine medicine that is taken by mouth.  Talk with your health care provider about combining strategies, such as taking medicines while you are also receiving in-person counseling. Using these two strategies together   makes you more likely to succeed in quitting than if you used either strategy on its own. If you are pregnant or breastfeeding, talk with your health care provider about finding counseling or other support strategies to quit smoking. Do not take medicine to help you quit smoking unless told to do so by your health care  provider. What things can I do to make it easier to quit? Quitting smoking might feel overwhelming at first, but there is a lot that you can do to make it easier. Take these important actions:  Reach out to your family and friends and ask that they support and encourage you during this time. Call telephone quitlines, reach out to support groups, or work with a counselor for support.  Ask people who smoke to avoid smoking around you.  Avoid places that trigger you to smoke, such as bars, parties, or smoke-break areas at work.  Spend time around people who do not smoke.  Lessen stress in your life, because stress can be a smoking trigger for some people. To lessen stress, try: ? Exercising regularly. ? Deep-breathing exercises. ? Yoga. ? Meditating. ? Performing a body scan. This involves closing your eyes, scanning your body from head to toe, and noticing which parts of your body are particularly tense. Purposefully relax the muscles in those areas.  Download or purchase mobile phone or tablet apps (applications) that can help you stick to your quit plan by providing reminders, tips, and encouragement. There are many free apps, such as QuitGuide from the CDC (Centers for Disease Control and Prevention). You can find other support for quitting smoking (smoking cessation) through smokefree.gov and other websites.  How will I feel when I quit smoking? Within the first 24 hours of quitting smoking, you may start to feel some withdrawal symptoms. These symptoms are usually most noticeable 2-3 days after quitting, but they usually do not last beyond 2-3 weeks. Changes or symptoms that you might experience include:  Mood swings.  Restlessness, anxiety, or irritation.  Difficulty concentrating.  Dizziness.  Strong cravings for sugary foods in addition to nicotine.  Mild weight gain.  Constipation.  Nausea.  Coughing or a sore throat.  Changes in how your medicines work in your  body.  A depressed mood.  Difficulty sleeping (insomnia).  After the first 2-3 weeks of quitting, you may start to notice more positive results, such as:  Improved sense of smell and taste.  Decreased coughing and sore throat.  Slower heart rate.  Lower blood pressure.  Clearer skin.  The ability to breathe more easily.  Fewer sick days.  Quitting smoking is very challenging for most people. Do not get discouraged if you are not successful the first time. Some people need to make many attempts to quit before they achieve long-term success. Do your best to stick to your quit plan, and talk with your health care provider if you have any questions or concerns. This information is not intended to replace advice given to you by your health care provider. Make sure you discuss any questions you have with your health care provider. Document Released: 01/09/2001 Document Revised: 09/13/2015 Document Reviewed: 06/01/2014 Elsevier Interactive Patient Education  2017 [employer] Inc.  
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
## 2016-02-17 ENCOUNTER — Telehealth: Payer: Self-pay | Admitting: Orthopaedic Surgery

## 2016-02-17 MED ORDER — OXYCODONE-ACETAMINOPHEN 5-325 MG PO TABS: 1.0000 | ORAL_TABLET | Freq: Four times a day (QID) | ORAL | 0 refills | Status: DC | PRN

## 2016-02-17 NOTE — Telephone Encounter (Signed)
Patient requests a refill on Oxycodone/Acetaminophen 5-325  Mgs.  Qty 100 ° °Sig: Take 1 tablet by mouth every 6 (six) hours as needed for moderate pain or severe pain (Must last 30 days.  Do not drive or operate machinery while taking this medicine). °

## 2016-03-15 ENCOUNTER — Telehealth: Payer: Self-pay | Admitting: Orthopaedic Surgery

## 2016-03-15 ENCOUNTER — Encounter: Payer: Self-pay | Admitting: Orthopaedic Surgery

## 2016-03-15 MED ORDER — OXYCODONE-ACETAMINOPHEN 5-325 MG PO TABS: 1.0000 | ORAL_TABLET | Freq: Four times a day (QID) | ORAL | 0 refills | Status: DC | PRN

## 2016-03-15 NOTE — Telephone Encounter (Signed)
Oxycodone-Acetaminophen 5/325mg  Qty 100 Tablets °

## 2016-03-29 DIAGNOSIS — T8859XA Other complications of anesthesia, initial encounter: Secondary | ICD-10-CM

## 2016-03-29 HISTORY — PX: ESOPHAGOMYOTOMY: SHX5240

## 2016-03-29 HISTORY — DX: Other complications of anesthesia, initial encounter: T88.59XA

## 2016-04-17 ENCOUNTER — Ambulatory Visit: Payer: Medicare HMO | Admitting: Orthopaedic Surgery

## 2016-04-17 ENCOUNTER — Encounter: Payer: Self-pay | Admitting: Orthopaedic Surgery

## 2016-05-01 ENCOUNTER — Telehealth: Payer: Self-pay | Admitting: Orthopaedic Surgery

## 2016-05-01 NOTE — Telephone Encounter (Signed)
Oxycodone-Acetaminophen  5/325 mg  Qty 95 Tablets  Pt had surgery on his stomach in the last month. I told him you normally don't do any medication when the pt is still under the other doctor's care. He stated that the other doctor is only giving him medication for his stomach and asked if I would send this request.

## 2016-05-02 MED ORDER — OXYCODONE-ACETAMINOPHEN 5-325 MG PO TABS: 1.0000 | ORAL_TABLET | Freq: Four times a day (QID) | ORAL | 0 refills | Status: DC | PRN

## 2016-05-04 ENCOUNTER — Encounter (HOSPITAL_COMMUNITY): Admission: RE | Payer: Self-pay | Source: Ambulatory Visit

## 2016-05-04 ENCOUNTER — Ambulatory Visit (HOSPITAL_COMMUNITY): Admission: RE | Admit: 2016-05-04 | Payer: Medicare HMO | Source: Ambulatory Visit | Admitting: Oral Surgery

## 2016-05-04 SURGERY — MULTIPLE EXTRACTION WITH ALVEOLOPLASTY
Anesthesia: General

## 2016-05-10 ENCOUNTER — Encounter: Payer: Self-pay | Admitting: Orthopaedic Surgery

## 2016-05-10 ENCOUNTER — Ambulatory Visit (INDEPENDENT_AMBULATORY_CARE_PROVIDER_SITE_OTHER): Payer: Medicare HMO | Admitting: Orthopaedic Surgery

## 2016-05-10 VITALS — BP 131/80 | HR 104 | Temp 97.5°F | Ht 71.0 in | Wt 220.0 lb

## 2016-05-10 DIAGNOSIS — M545 Low back pain, unspecified: Secondary | ICD-10-CM

## 2016-05-10 DIAGNOSIS — G894 Chronic pain syndrome: Secondary | ICD-10-CM

## 2016-05-10 DIAGNOSIS — G8929 Other chronic pain: Secondary | ICD-10-CM | POA: Diagnosis not present

## 2016-05-10 MED ORDER — OXYCODONE-ACETAMINOPHEN 7.5-325 MG PO TABS: 1.0000 | ORAL_TABLET | Freq: Four times a day (QID) | ORAL | 0 refills | Status: DC | PRN

## 2016-05-10 NOTE — Patient Instructions (Signed)
Steps to Quit Smoking Smoking tobacco can be bad for your health. It can also affect almost every organ in your body. Smoking puts you and people around you at risk for many serious long-lasting (chronic) diseases. Quitting smoking is hard, but it is one of the best things that you can do for your health. It is never too late to quit. What are the benefits of quitting smoking? When you quit smoking, you lower your risk for getting serious diseases and conditions. They can include:  Lung cancer or lung disease.  Heart disease.  Stroke.  Heart attack.  Not being able to have children (infertility).  Weak bones (osteoporosis) and broken bones (fractures). If you have coughing, wheezing, and shortness of breath, those symptoms may get better when you quit. You may also get sick less often. If you are pregnant, quitting smoking can help to lower your chances of having a baby of low birth weight. What can I do to help me quit smoking? Talk with your doctor about what can help you quit smoking. Some things you can do (strategies) include:  Quitting smoking totally, instead of slowly cutting back how much you smoke over a period of time.  Going to in-person counseling. You are more likely to quit if you go to many counseling sessions.  Using resources and support systems, such as:  Online chats with a counselor.  Phone quitlines.  Printed self-help materials.  Support groups or group counseling.  Text messaging programs.  Mobile phone apps or applications.  Taking medicines. Some of these medicines may have nicotine in them. If you are pregnant or breastfeeding, do not take any medicines to quit smoking unless your doctor says it is okay. Talk with your doctor about counseling or other things that can help you. Talk with your doctor about using more than one strategy at the same time, such as taking medicines while you are also going to in-person counseling. This can help make quitting  easier. What things can I do to make it easier to quit? Quitting smoking might feel very hard at first, but there is a lot that you can do to make it easier. Take these steps:  Talk to your family and friends. Ask them to support and encourage you.  Call phone quitlines, reach out to support groups, or work with a counselor.  Ask people who smoke to not smoke around you.  Avoid places that make you want (trigger) to smoke, such as:  Bars.  Parties.  Smoke-break areas at work.  Spend time with people who do not smoke.  Lower the stress in your life. Stress can make you want to smoke. Try these things to help your stress:  Getting regular exercise.  Deep-breathing exercises.  Yoga.  Meditating.  Doing a body scan. To do this, close your eyes, focus on one area of your body at a time from head to toe, and notice which parts of your body are tense. Try to relax the muscles in those areas.  Download or buy apps on your mobile phone or tablet that can help you stick to your quit plan. There are many free apps, such as QuitGuide from the CDC (Centers for Disease Control and Prevention). You can find more support from smokefree.gov and other websites. This information is not intended to replace advice given to you by your health care provider. Make sure you discuss any questions you have with your health care provider. Document Released: 11/11/2008 Document Revised: 09/13/2015 Document   Reviewed: 06/01/2014 Elsevier Interactive Patient Education  2017 [employer].  
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
## 2016-05-22 ENCOUNTER — Telehealth: Payer: Self-pay | Admitting: Orthopaedic Surgery

## 2016-05-22 MED ORDER — OXYCODONE-ACETAMINOPHEN 7.5-325 MG PO TABS: 1.0000 | ORAL_TABLET | Freq: Four times a day (QID) | ORAL | 0 refills | Status: DC | PRN

## 2016-05-22 NOTE — Telephone Encounter (Signed)
Oxycodone-Acetaminophen 7.5/325 mg  Qty 40 Tablets   Pt was told to use MyChart again.

## 2016-06-05 ENCOUNTER — Telehealth: Payer: Self-pay | Admitting: Orthopaedic Surgery

## 2016-06-05 MED ORDER — OXYCODONE-ACETAMINOPHEN 7.5-325 MG PO TABS: 1.0000 | ORAL_TABLET | Freq: Four times a day (QID) | ORAL | 0 refills | Status: DC | PRN

## 2016-06-05 NOTE — Telephone Encounter (Signed)
Oxycodone-Acetaminophen 7.5/325 mg  Qty  38 Tablets

## 2016-06-19 ENCOUNTER — Telehealth: Payer: Self-pay | Admitting: Orthopaedic Surgery

## 2016-06-19 MED ORDER — OXYCODONE-ACETAMINOPHEN 7.5-325 MG PO TABS: 1.0000 | ORAL_TABLET | Freq: Four times a day (QID) | ORAL | 0 refills | Status: DC | PRN

## 2016-06-19 NOTE — Telephone Encounter (Signed)
Patient requests refill on Oxycodone/Acetaminophen (percocet) 7.5-325  Mgs.   Qty  35  Sig: Take 1 tablet by mouth every 6 (six) hours as needed for moderate pain or severe pain (Must last 14 days.Do not take and drive a car or operate machinery).

## 2016-06-19 NOTE — Telephone Encounter (Signed)
Patient called stating that the pain clinic we had referred him to will not see him because he gets [facility].  He stated that he wants to be sent to one that will give him his Oxycodone.  Please advise

## 2016-06-20 NOTE — Telephone Encounter (Signed)
I called and left a message for him to return my call.   Dr. Hilda LiasKeeling said to tell him to check with the clinic that he is already going to [facility].  We can not refer there because they only accept internal referrals to their pain clinic.  Since he is already getting injections in his back there, he already has a relationship with them.

## 2016-06-21 ENCOUNTER — Telehealth: Payer: Self-pay | Admitting: Orthopaedic Surgery

## 2016-06-21 NOTE — Telephone Encounter (Signed)
Patient states that [facility]'t do pain [facility]. He is [facility].  He wants to be referred to [facility] in [place] Dr. Laurian Brim'Toole is there.  They are accepting new patients. Their ph# [phone] and fax # [phone]. He has checked about this and would like to be referred there.

## 2016-06-22 NOTE — Pre-Procedure Instructions (Addendum)
Max [name]  06/22/2016      [facility] - [place], [place] - [place], [place] - [address] [place] [address] Phone: [phone] Fax: [phone]  [id number] - [place], [place] - [address] AT [facility] [address] [place] [address] Phone: [phone] Fax: [phone]    Your procedure is scheduled on 06/29/16  Report to [facility] Admitting at 815 A.M.  Call this number if you have problems the morning of surgery:  [phone]   Remember:  Do not eat food or drink liquids after midnight.  Take these medicines the morning of surgery with A SIP OF WATER  Inhaler/nebulizer if needed(bring inhaler),allopurinol,clonazepam,lexapro,gabapentin, Pantoprazole(prilosec)  STOP all herbel meds, nsaids (aleve,naproxen,advil,ibuprofen)  prior to surgery starting today 5 /29/18 including all vitamins/supplements,aspirin,melatonin  STOP eliquis per dr   Drucilla Schmidt not wear jewelry, make-up or nail polish.  Do not wear lotions, powders, or perfumes, or deoderant.  Do not shave 48 hours prior to surgery.  Men may shave face and neck.  Do not bring valuables to the hospital.  [facility] is not responsible for any belongings or valuables.  Contacts, dentures or bridgework may not be worn into surgery.  Leave your suitcase in the car.  After surgery it may be brought to your room.  For patients admitted to the hospital, discharge time will be determined by your treatment team.  Patients discharged the day of surgery will not be allowed to drive home.   Special instructions:   Special Instructions: [facility] - Preparing for Surgery  Before surgery, you can play an important role.  Because skin is not sterile, your skin needs to be as free of germs as possible.  You can reduce the number of germs on you skin by washing with CHG (chlorahexidine gluconate) soap before surgery.  CHG is an antiseptic cleaner  which kills germs and bonds with the skin to continue killing germs even after washing.  Please DO NOT use if you have an allergy to CHG or antibacterial soaps.  If your skin becomes reddened/irritated stop using the CHG and inform your nurse when you arrive at Short Stay.  Do not shave (including legs and underarms) for at least 48 hours prior to the first CHG shower.  You may shave your face.  Please follow these instructions carefully:   1.  Shower with CHG Soap the night before surgery and the morning of Surgery.  2.  If you choose to wash your hair, wash your hair first as usual with your normal shampoo.  3.  After you shampoo, rinse your hair and body thoroughly to remove the Shampoo.  4.  Use CHG as you would any other liquid soap.  You can apply chg directly  to the skin and wash gently with scrungie or a clean washcloth.  5.  Apply the CHG Soap to your body ONLY FROM THE NECK DOWN.  Do not use on open wounds or open sores.  Avoid contact with your eyes ears, mouth and genitals (private parts).  Wash genitals (private parts)       with your normal soap.  6.  Wash thoroughly, paying special attention to the area where your surgery will be performed.  7.  Thoroughly rinse your body with warm water from the neck down.  8.  DO NOT shower/wash with your normal soap after using  and rinsing off the CHG Soap.  9.  Pat yourself dry with a clean towel.            10.  Wear clean pajamas.            11.  Place clean sheets on your bed the night of your first shower and do not sleep with pets.  Day of Surgery  Do not apply any lotions/deodorants the morning of surgery.  Please wear clean clothes to the hospital/surgery center.  Please read over the  fact sheets that you were given.

## 2016-06-26 ENCOUNTER — Encounter (HOSPITAL_COMMUNITY)
Admission: RE | Admit: 2016-06-26 | Discharge: 2016-06-26 | Disposition: A | Discharge status: Home / Self Care | Payer: Medicare HMO | Source: Ambulatory Visit | Attending: Oral Surgery | Admitting: Oral Surgery

## 2016-06-26 ENCOUNTER — Encounter (HOSPITAL_COMMUNITY): Payer: Self-pay | Admitting: Emergency Medicine

## 2016-06-26 ENCOUNTER — Encounter (HOSPITAL_COMMUNITY): Payer: Self-pay

## 2016-06-26 DIAGNOSIS — Z01810 Encounter for preprocedural cardiovascular examination: Secondary | ICD-10-CM | POA: Insufficient documentation

## 2016-06-26 DIAGNOSIS — K219 Gastro-esophageal reflux disease without esophagitis: Secondary | ICD-10-CM | POA: Insufficient documentation

## 2016-06-26 DIAGNOSIS — G4733 Obstructive sleep apnea (adult) (pediatric): Secondary | ICD-10-CM | POA: Diagnosis not present

## 2016-06-26 DIAGNOSIS — Z01812 Encounter for preprocedural laboratory examination: Secondary | ICD-10-CM | POA: Insufficient documentation

## 2016-06-26 DIAGNOSIS — X58XXXA Exposure to other specified factors, initial encounter: Secondary | ICD-10-CM | POA: Diagnosis not present

## 2016-06-26 DIAGNOSIS — F17200 Nicotine dependence, unspecified, uncomplicated: Secondary | ICD-10-CM | POA: Insufficient documentation

## 2016-06-26 DIAGNOSIS — R9431 Abnormal electrocardiogram [ECG] [EKG]: Secondary | ICD-10-CM | POA: Diagnosis not present

## 2016-06-26 DIAGNOSIS — F4310 Post-traumatic stress disorder, unspecified: Secondary | ICD-10-CM | POA: Insufficient documentation

## 2016-06-26 DIAGNOSIS — J449 Chronic obstructive pulmonary disease, unspecified: Secondary | ICD-10-CM | POA: Insufficient documentation

## 2016-06-26 DIAGNOSIS — I10 Essential (primary) hypertension: Secondary | ICD-10-CM | POA: Insufficient documentation

## 2016-06-26 DIAGNOSIS — F1290 Cannabis use, unspecified, uncomplicated: Secondary | ICD-10-CM | POA: Diagnosis not present

## 2016-06-26 HISTORY — DX: Unspecified osteoarthritis, unspecified site: M19.90

## 2016-06-26 HISTORY — DX: Adverse effect of unspecified anesthetic, initial encounter: T41.45XA

## 2016-06-26 HISTORY — DX: Cardiac murmur, unspecified: R01.1

## 2016-06-26 HISTORY — DX: Peripheral vascular disease, unspecified: I73.9

## 2016-06-26 LAB — SURGICAL PCR SCREEN
MRSA, PCR: POSITIVE — AB
Staphylococcus aureus: POSITIVE — AB

## 2016-06-26 LAB — CBC
HCT: 44.2 % (ref 39.0–52.0)
Hemoglobin: 14.6 g/dL (ref 13.0–17.0)
MCH: 31.4 pg (ref 26.0–34.0)
MCHC: 33 g/dL (ref 30.0–36.0)
MCV: 95.1 fL (ref 78.0–100.0)
Platelets: 269 10*3/uL (ref 150–400)
RBC: 4.65 MIL/uL (ref 4.22–5.81)
RDW: 15.2 % (ref 11.5–15.5)
WBC: 12.1 10*3/uL — ABNORMAL HIGH (ref 4.0–10.5)

## 2016-06-26 LAB — COMPREHENSIVE METABOLIC PANEL
ALT: 22 U/L (ref 17–63)
AST: 21 U/L (ref 15–41)
Albumin: 3.9 g/dL (ref 3.5–5.0)
Alkaline Phosphatase: 81 U/L (ref 38–126)
Anion gap: 10 (ref 5–15)
BUN: 18 mg/dL (ref 6–20)
CO2: 24 mmol/L (ref 22–32)
Calcium: 9.1 mg/dL (ref 8.9–10.3)
Chloride: 102 mmol/L (ref 101–111)
Creatinine, Ser: 0.99 mg/dL (ref 0.61–1.24)
GFR calc Af Amer: >60 mL/min (ref >60)
GFR calc non Af Amer: >60 mL/min (ref >60)
Glucose, Bld: 105 mg/dL — ABNORMAL HIGH (ref 65–99)
Potassium: 4.2 mmol/L (ref 3.5–5.1)
Sodium: 136 mmol/L (ref 135–145)
Total Bilirubin: 0.7 mg/dL (ref 0.3–1.2)
Total Protein: 7.1 g/dL (ref 6.5–8.1)

## 2016-06-26 NOTE — Telephone Encounter (Signed)
Call received from [facility], ph# [phone] / Fax #[phone] - per Irving BurtonEmily, states referral was received, however, states due to [facility] Medicare primary insurance, referral/authorization (via online) from primary care provider will be needed.

## 2016-06-26 NOTE — Telephone Encounter (Signed)
Max [name] spoke with the patient and explained this.

## 2016-06-27 NOTE — Progress Notes (Addendum)
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 Uses 3 L home O2. Marijuana use. Current smoker. BMI 32.5. S/p esophagomyotomy with fundoplasty 04/12/16 (notes in care everywhere)  Medications include: Albuterol, Eliquis, lisinopril-HCTZ, Protonix.  Preoperative labs reviewed.  CXR 07/06/15: There is improvement in aeration. Residual mild interstitial prominence bilateral perihilar and bilateral basilar consistent with improving edema or pneumonitis. No pleural effusion.  EKG 06/26/16: NSR. Right axis deviation. ST & T wave abnormality, consider inferior ischemia. Suspect arm lead reversal. Will repeat DOS.   Echo 10/21/13:  - Left ventricle: The cavity size was normal. Systolic function wasnormal. The estimated ejection fraction was in the range of 55%to 60%. Wall motion was normal; there were no regional wallmotion abnormalities. - Atrial septum: No defect or patent foramen ovale was identified. - Pericardium, extracardiac: A trivial pericardial effusion wasidentified posterior to the heart.  Pt was unaware of why he was taking eliquis.  [facility] indicate he developed L IJ and R cephalic vein thrombi post-op esophageal myotomy and fundoplasty 04/22/16.  Eliquis was initiated in the hospital.  Discharge summary indicates pt was to f/u with PCP for further care/management of DVT.  I spoke with Francee Piccoloerek in Dr. Albertina ParrAvbuere's office.  Dr. Concepcion ElkAvbuere was unaware of DVT.  I have asked Dr. Concepcion ElkAvbuere to review situation, and determine if pt can be off of anticoagulation at this point for surgery 06/29/16.  Awaiting call back.  Sam in Dr. Randa EvensJensen's office aware.   Rica Mastngela Jerett Odonohue, FNP-BC [facility]/Anesthesiology Phone: [phone] 06/27/2016 4:53  PM  Addendum:   Francee Piccoloerek from Dr. Albertina ParrAvbuere's office called back.  Pt will need to stay on eliquis without interruption for at least 3 months.  At 3 months, he will be reevaluated and the need for further anticoagulation determined.  His surgery will need to be postponed unless it can be done on eliquis. I notified Sam in Dr. Randa EvensJensen's office.   Rica Mastngela Sharah Finnell, FNP-BC [facility]/Anesthesiology Phone: [phone] 06/28/2016 9:28 AM

## 2016-06-29 ENCOUNTER — Encounter (HOSPITAL_COMMUNITY): Admission: RE | Payer: Self-pay | Source: Ambulatory Visit

## 2016-06-29 ENCOUNTER — Ambulatory Visit (HOSPITAL_COMMUNITY): Admission: RE | Admit: 2016-06-29 | Payer: Medicare HMO | Source: Ambulatory Visit | Admitting: Oral Surgery

## 2016-06-29 SURGERY — MULTIPLE EXTRACTION WITH ALVEOLOPLASTY
Anesthesia: General | Laterality: Bilateral

## 2016-07-02 ENCOUNTER — Telehealth: Payer: Self-pay | Admitting: Orthopaedic Surgery

## 2016-07-02 MED ORDER — OXYCODONE-ACETAMINOPHEN 7.5-325 MG PO TABS: 1.0000 | ORAL_TABLET | Freq: Four times a day (QID) | ORAL | 0 refills | Status: DC | PRN

## 2016-07-02 NOTE — Telephone Encounter (Signed)
Patient called for refill:  oxyCODONE-acetaminophen (PERCOCET) 7.5-325 MG tablet 32 tablet

## 2016-07-03 ENCOUNTER — Telehealth: Payer: Self-pay | Admitting: Radiology

## 2016-07-03 NOTE — Telephone Encounter (Signed)
Previously stated:  The patient's referral was sent to [facility].  This is an errol.  [facility], Dr. Laurian Brim'Toole.  They called to say he will need to be referred by his family Dr.  I called and told the patient.

## 2016-07-03 NOTE — Telephone Encounter (Signed)
He has an apt at [facility] now and he is aware of it.

## 2016-07-16 ENCOUNTER — Telehealth: Payer: Self-pay | Admitting: Orthopaedic Surgery

## 2016-07-16 MED ORDER — OXYCODONE-ACETAMINOPHEN 7.5-325 MG PO TABS: 1.0000 | ORAL_TABLET | Freq: Four times a day (QID) | ORAL | 0 refills | Status: DC | PRN

## 2016-07-16 NOTE — Telephone Encounter (Signed)
Oxycodone-Acetaminophen  7.5/325 mg  Qty 28 Tablets °

## 2016-07-18 ENCOUNTER — Telehealth: Payer: Self-pay | Admitting: Orthopaedic Surgery

## 2016-07-18 NOTE — Telephone Encounter (Signed)
Patient called this morning to let us know that he had continuously tried to reach the folks at [facility] but was told that all the offices in [place] had been shut down and no longer in business.  He did get an appt with [facility] in [place].  He has an appt with [facility] on July 7th @ 10am.  The phone # to the facility is [phone] and [phone].  They will need a referral and the last the office visit notes.     pts cb  [phone]

## 2016-07-18 NOTE — Telephone Encounter (Signed)
I spoke with Max [name] and relayed the message that Dr. Hilda LiasKeeling will not be doing anymore pain medication for him after July 15th, 2018. If he needs more pain medication after that he will need to get it either from his PCP or the pain clinic [place]. Max [name] stated that he understood.

## 2016-07-19 NOTE — Telephone Encounter (Signed)
The referral was faxed to [facility] and Kathie RhodesBetty spoke with [name] and let him know.

## 2016-08-09 ENCOUNTER — Ambulatory Visit: Payer: Medicare HMO | Admitting: Orthopaedic Surgery

## 2016-08-16 ENCOUNTER — Ambulatory Visit (INDEPENDENT_AMBULATORY_CARE_PROVIDER_SITE_OTHER): Payer: Medicare HMO | Admitting: Orthopaedic Surgery

## 2016-08-16 ENCOUNTER — Encounter: Payer: Self-pay | Admitting: Orthopaedic Surgery

## 2016-08-16 VITALS — BP 115/83 | HR 71 | Temp 98.3°F | Ht 71.0 in | Wt 248.0 lb

## 2016-08-16 DIAGNOSIS — F17210 Nicotine dependence, cigarettes, uncomplicated: Secondary | ICD-10-CM

## 2016-08-16 DIAGNOSIS — M545 Low back pain, unspecified: Secondary | ICD-10-CM

## 2016-08-16 DIAGNOSIS — G8929 Other chronic pain: Secondary | ICD-10-CM | POA: Diagnosis not present

## 2016-08-16 DIAGNOSIS — G894 Chronic pain syndrome: Secondary | ICD-10-CM
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
## 2016-10-03 NOTE — H&P (Signed)
HISTORY AND PHYSICAL  Max [name] is a 66 y.o. male patient with CC: painful teeth  No diagnosis found.  Past Medical History:  Diagnosis Date  . Acute on chronic respiratory failure (HCC)   . Anxiety   . Arthritis   . Bulging discs    neck  . CAP (community acquired pneumonia) 06/2015   pt states breathing good now  . Chronic back pain   . Chronic knee pain   . Chronic pain   . Complication of anesthesia    last surgery- esophagus sphinter - propofol given did not wake for 2 days  . COPD (chronic obstructive pulmonary disease) (HCC)   . Depression   . GERD (gastroesophageal reflux disease)   . Heart murmur    infant  . Hepatitis age 66   hepatitis c , got tx for , no longer has per pt  . Hypertension   . Lumbar radiculopathy   . On home O2    3L N/C  . Peripheral vascular disease (HCC)    LIJ and cephalic thrombus with 3/18 surgery  . PTSD (post-traumatic stress disorder)   . Sciatica   . Sleep apnea    supposed to wear CPAP but doesn'uses o2 2 L  . Syncopal episodes    pt states "it happens after i try to get up after smoking a cigarette"  . Torn rotator cuff     No current facility-administered medications for this encounter.    Current Outpatient Prescriptions  Medication Sig Dispense Refill  . albuterol (PROVENTIL HFA;VENTOLIN HFA) 108 (90 BASE) MCG/ACT inhaler Inhale 2 puffs into the lungs every 4 (four) hours as needed for wheezing or shortness of breath. 1 Inhaler 3  . albuterol (PROVENTIL) (2.5 MG/3ML) 0.083% nebulizer solution Take 2.5 mg by nebulization every 6 (six) hours as needed for wheezing or shortness of breath.    . allopurinol (ZYLOPRIM) 100 MG tablet Take 100 mg by mouth every other day.     . clonazePAM (KLONOPIN) 2 MG tablet Take 2 mg by mouth daily.    . clotrimazole-betamethasone (LOTRISONE) cream Apply 1 application topically daily. To face    . ELIQUIS 5 MG TABS tablet Take 5 mg by mouth 2 (two) times daily.    Marland Kitchen. escitalopram (LEXAPRO)  20 MG tablet Take 20 mg by mouth every morning.     . gabapentin (NEURONTIN) 600 MG tablet Take 1,500 mg by mouth 3 (three) times daily.     Marland Kitchen. lisinopril-hydrochlorothiazide (PRINZIDE,ZESTORETIC) 10-12.5 MG tablet Take 1 tablet by mouth daily.    . Melatonin 5 MG TABS Take 5 mg by mouth at bedtime.     Marland Kitchen. oxyCODONE-acetaminophen (PERCOCET) 7.5-325 MG tablet Take 1 tablet by mouth every 6 (six) hours as needed for moderate pain or severe pain (Must last 14 days.Do not take and drive a car or operate machinery). 20 tablet 0  . OXYGEN Inhale into the lungs. Uses 2 liters at hs and prn during day    . pantoprazole (PROTONIX) 40 MG tablet Take 1 tablet (40 mg total) by mouth daily at 12 noon. (Patient taking differently: Take 40 mg by mouth daily. )    . tiZANidine (ZANAFLEX) 4 MG tablet Take 4 mg by mouth 3 (three) times daily as needed for muscle spasms.    Marland Kitchen. zolpidem (AMBIEN) 10 MG tablet Take 1 tablet (10 mg total) by mouth at bedtime as needed for sleep. 30 tablet 0   Allergies  Allergen Reactions  . 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 Flexeril [Cyclobenzaprine Hcl] Hives and Other (See Comments)    Restless legs  . Propofol Other (See Comments)    Pt has a difficultly waking up from this   . Other Rash    Raw tobacco   Active Problems:   * No active hospital problems. *  Vitals: There were no vitals taken for this visit. Lab results:No results found for this or any previous visit (from the past 24 hour(s)). Radiology Results: No results found. General appearance: alert, cooperative and moderately obese Head: Normocephalic, without obvious abnormality, atraumatic Eyes: negative Nose: Nares normal. Septum midline. Mucosa normal. No drainage or sinus tenderness. Throat: grossly decayed teeth. Pharynx clear. No masses, lesions, trismus. Neck: no adenopathy, supple, symmetrical, trachea midline and thyroid not enlarged, symmetric, no tenderness/mass/nodules Resp:  wheezes LLL Cardio: regular rate and rhythm, S1, S2 normal, no murmur, click, rub or gallop  Assessment: Multiple nonrestorable teeth secondary to dental caries.  Plan:Multiple dental extractions with alveoloplasty. GA. Nasal. Day surgery.  Max [name] 10/03/2016

## 2016-10-04 ENCOUNTER — Encounter (HOSPITAL_COMMUNITY): Payer: Self-pay | Admitting: *Deleted

## 2016-10-04 MED ORDER — DEXTROSE 5 % IV SOLN
3.0000 g | INTRAVENOUS | Status: AC
  Administered 2016-10-05: 3 g via INTRAVENOUS
  Filled 2016-10-04: qty 3000

## 2016-10-05 ENCOUNTER — Encounter (HOSPITAL_COMMUNITY)
Admission: RE | Disposition: A | Discharge status: Home / Self Care | Payer: Self-pay | Source: Ambulatory Visit | Attending: Oral Surgery

## 2016-10-05 ENCOUNTER — Encounter (HOSPITAL_COMMUNITY): Payer: Self-pay

## 2016-10-05 ENCOUNTER — Ambulatory Visit (HOSPITAL_COMMUNITY): Payer: Medicare HMO | Admitting: Certified Registered Nurse Anesthetist

## 2016-10-05 ENCOUNTER — Ambulatory Visit (HOSPITAL_COMMUNITY)
Admission: RE | Admit: 2016-10-05 | Discharge: 2016-10-05 | Disposition: A | Discharge status: Home / Self Care | Payer: Medicare HMO | Source: Ambulatory Visit | Attending: Oral Surgery | Admitting: Oral Surgery

## 2016-10-05 DIAGNOSIS — F4310 Post-traumatic stress disorder, unspecified: Secondary | ICD-10-CM | POA: Insufficient documentation

## 2016-10-05 DIAGNOSIS — R55 Syncope and collapse: Secondary | ICD-10-CM | POA: Diagnosis not present

## 2016-10-05 DIAGNOSIS — I10 Essential (primary) hypertension: Secondary | ICD-10-CM | POA: Diagnosis not present

## 2016-10-05 DIAGNOSIS — J449 Chronic obstructive pulmonary disease, unspecified: Secondary | ICD-10-CM | POA: Diagnosis not present

## 2016-10-05 DIAGNOSIS — Z7901 Long term (current) use of anticoagulants: Secondary | ICD-10-CM | POA: Diagnosis not present

## 2016-10-05 DIAGNOSIS — M5416 Radiculopathy, lumbar region: Secondary | ICD-10-CM | POA: Diagnosis not present

## 2016-10-05 DIAGNOSIS — M1990 Unspecified osteoarthritis, unspecified site: Secondary | ICD-10-CM | POA: Insufficient documentation

## 2016-10-05 DIAGNOSIS — Z9981 Dependence on supplemental oxygen: Secondary | ICD-10-CM | POA: Insufficient documentation

## 2016-10-05 DIAGNOSIS — K219 Gastro-esophageal reflux disease without esophagitis: Secondary | ICD-10-CM | POA: Insufficient documentation

## 2016-10-05 DIAGNOSIS — M549 Dorsalgia, unspecified: Secondary | ICD-10-CM | POA: Diagnosis not present

## 2016-10-05 DIAGNOSIS — Z8619 Personal history of other infectious and parasitic diseases: Secondary | ICD-10-CM | POA: Diagnosis not present

## 2016-10-05 DIAGNOSIS — G4730 Sleep apnea, unspecified: Secondary | ICD-10-CM | POA: Diagnosis not present

## 2016-10-05 DIAGNOSIS — K029 Dental caries, unspecified: Secondary | ICD-10-CM | POA: Diagnosis present

## 2016-10-05 DIAGNOSIS — I739 Peripheral vascular disease, unspecified: Secondary | ICD-10-CM | POA: Insufficient documentation

## 2016-10-05 DIAGNOSIS — E669 Obesity, unspecified: Secondary | ICD-10-CM | POA: Diagnosis not present

## 2016-10-05 DIAGNOSIS — M25569 Pain in unspecified knee: Secondary | ICD-10-CM | POA: Diagnosis not present

## 2016-10-05 DIAGNOSIS — G8929 Other chronic pain: Secondary | ICD-10-CM | POA: Diagnosis not present

## 2016-10-05 DIAGNOSIS — Z888 Allergy status to other drugs, medicaments and biological substances status: Secondary | ICD-10-CM | POA: Diagnosis not present

## 2016-10-05 DIAGNOSIS — Z6837 Body mass index (BMI) 37.0-37.9, adult: Secondary | ICD-10-CM | POA: Insufficient documentation

## 2016-10-05 DIAGNOSIS — Z79899 Other long term (current) drug therapy: Secondary | ICD-10-CM | POA: Insufficient documentation

## 2016-10-05 DIAGNOSIS — F329 Major depressive disorder, single episode, unspecified: Secondary | ICD-10-CM | POA: Insufficient documentation

## 2016-10-05 DIAGNOSIS — M5020 Other cervical disc displacement, unspecified cervical region: Secondary | ICD-10-CM | POA: Insufficient documentation

## 2016-10-05 HISTORY — DX: Dental caries, unspecified: K02.9

## 2016-10-05 HISTORY — PX: TOOTH EXTRACTION: SHX859

## 2016-10-05 HISTORY — DX: Unspecified intracranial injury with loss of consciousness of unspecified duration, initial encounter: S06.9X9A

## 2016-10-05 LAB — BASIC METABOLIC PANEL
Anion gap: 10 (ref 5–15)
BUN: 16 mg/dL (ref 6–20)
CO2: 26 mmol/L (ref 22–32)
Calcium: 9.3 mg/dL (ref 8.9–10.3)
Chloride: 102 mmol/L (ref 101–111)
Creatinine, Ser: 1.11 mg/dL (ref 0.61–1.24)
GFR calc Af Amer: >60 mL/min (ref >60)
GFR calc non Af Amer: >60 mL/min (ref >60)
Glucose, Bld: 98 mg/dL (ref 65–99)
Potassium: 4 mmol/L (ref 3.5–5.1)
Sodium: 138 mmol/L (ref 135–145)

## 2016-10-05 LAB — CBC
HCT: 44.2 % (ref 39.0–52.0)
Hemoglobin: 14.9 g/dL (ref 13.0–17.0)
MCH: 31.9 pg (ref 26.0–34.0)
MCHC: 33.7 g/dL (ref 30.0–36.0)
MCV: 94.6 fL (ref 78.0–100.0)
Platelets: 270 10*3/uL (ref 150–400)
RBC: 4.67 MIL/uL (ref 4.22–5.81)
RDW: 14.3 % (ref 11.5–15.5)
WBC: 14.1 10*3/uL — ABNORMAL HIGH (ref 4.0–10.5)

## 2016-10-05 LAB — SURGICAL PCR SCREEN
MRSA, PCR: NEGATIVE
Staphylococcus aureus: NEGATIVE

## 2016-10-05 SURGERY — DENTAL RESTORATION/EXTRACTIONS
Anesthesia: General | Site: Mouth

## 2016-10-05 MED ORDER — LACTATED RINGERS IV SOLN
INTRAVENOUS | Status: DC
  Administered 2016-10-05 (×2): via INTRAVENOUS

## 2016-10-05 MED ORDER — OXYMETAZOLINE HCL 0.05 % NA SOLN
NASAL | Status: AC
  Filled 2016-10-05: qty 15

## 2016-10-05 MED ORDER — FENTANYL CITRATE (PF) 250 MCG/5ML IJ SOLN
INTRAMUSCULAR | Status: AC
  Filled 2016-10-05: qty 5

## 2016-10-05 MED ORDER — IBUPROFEN 800 MG PO TABS: 800.0000 mg | ORAL_TABLET | Freq: Three times a day (TID) | ORAL | 0 refills | Status: AC | PRN

## 2016-10-05 MED ORDER — FENTANYL CITRATE (PF) 100 MCG/2ML IJ SOLN
25.0000 ug | INTRAMUSCULAR | Status: DC | PRN
  Administered 2016-10-05 (×2): 50 ug via INTRAVENOUS

## 2016-10-05 MED ORDER — PHENYLEPHRINE 40 MCG/ML (10ML) SYRINGE FOR IV PUSH (FOR BLOOD PRESSURE SUPPORT)
PREFILLED_SYRINGE | INTRAVENOUS | Status: AC
  Filled 2016-10-05: qty 10

## 2016-10-05 MED ORDER — MIDAZOLAM HCL 2 MG/2ML IJ SOLN
INTRAMUSCULAR | Status: AC
  Filled 2016-10-05: qty 2

## 2016-10-05 MED ORDER — PROPOFOL 10 MG/ML IV BOLUS
INTRAVENOUS | Status: AC
  Filled 2016-10-05: qty 20

## 2016-10-05 MED ORDER — LIDOCAINE-EPINEPHRINE 2 %-1:100000 IJ SOLN
INTRAMUSCULAR | Status: DC | PRN
  Administered 2016-10-05: 16 mL

## 2016-10-05 MED ORDER — FENTANYL CITRATE (PF) 100 MCG/2ML IJ SOLN
INTRAMUSCULAR | Status: AC
  Administered 2016-10-05: 50 ug via INTRAVENOUS
  Filled 2016-10-05: qty 2

## 2016-10-05 MED ORDER — ETOMIDATE 2 MG/ML IV SOLN
INTRAVENOUS | Status: DC | PRN
  Administered 2016-10-05: 14 mg via INTRAVENOUS

## 2016-10-05 MED ORDER — SUCCINYLCHOLINE CHLORIDE 20 MG/ML IJ SOLN
INTRAMUSCULAR | Status: DC | PRN
  Administered 2016-10-05: 120 mg via INTRAVENOUS

## 2016-10-05 MED ORDER — LIDOCAINE HCL (CARDIAC) 20 MG/ML IV SOLN
INTRAVENOUS | Status: DC | PRN
  Administered 2016-10-05: 100 mg via INTRAVENOUS

## 2016-10-05 MED ORDER — SODIUM CHLORIDE 0.9 % IR SOLN
Status: DC | PRN
Start: 2016-10-05 — End: 2016-10-05
  Administered 2016-10-05: 1000 mL

## 2016-10-05 MED ORDER — MUPIROCIN 2 % EX OINT
1.0000 "application " | TOPICAL_OINTMENT | Freq: Once | CUTANEOUS | Status: AC
  Administered 2016-10-05: 1 via TOPICAL
  Filled 2016-10-05: qty 22

## 2016-10-05 MED ORDER — ONDANSETRON HCL 4 MG/2ML IJ SOLN
INTRAMUSCULAR | Status: DC | PRN
  Administered 2016-10-05: 4 mg via INTRAVENOUS

## 2016-10-05 MED ORDER — LIDOCAINE-EPINEPHRINE 2 %-1:100000 IJ SOLN
INTRAMUSCULAR | Status: AC
  Filled 2016-10-05: qty 1

## 2016-10-05 MED ORDER — MIDAZOLAM HCL 5 MG/5ML IJ SOLN
INTRAMUSCULAR | Status: DC | PRN
  Administered 2016-10-05: 2 mg via INTRAVENOUS

## 2016-10-05 MED ORDER — PHENYLEPHRINE HCL 10 MG/ML IJ SOLN
INTRAVENOUS | Status: DC | PRN
  Administered 2016-10-05: 25 ug/min via INTRAVENOUS

## 2016-10-05 MED ORDER — DEXAMETHASONE SODIUM PHOSPHATE 10 MG/ML IJ SOLN
INTRAMUSCULAR | Status: AC
  Filled 2016-10-05: qty 1

## 2016-10-05 MED ORDER — 0.9 % SODIUM CHLORIDE (POUR BTL) OPTIME
TOPICAL | Status: DC | PRN
  Administered 2016-10-05: 1000 mL

## 2016-10-05 MED ORDER — ONDANSETRON HCL 4 MG/2ML IJ SOLN: 4.0000 mg | Freq: Once | INTRAMUSCULAR | Status: DC | PRN

## 2016-10-05 MED ORDER — ONDANSETRON HCL 4 MG/2ML IJ SOLN
INTRAMUSCULAR | Status: AC
  Filled 2016-10-05: qty 2

## 2016-10-05 MED ORDER — DEXAMETHASONE SODIUM PHOSPHATE 10 MG/ML IJ SOLN
INTRAMUSCULAR | Status: DC | PRN
  Administered 2016-10-05: 10 mg via INTRAVENOUS

## 2016-10-05 MED ORDER — HYDROMORPHONE HCL 2 MG PO TABS: 2.0000 mg | ORAL_TABLET | ORAL | 0 refills | Status: DC | PRN

## 2016-10-05 MED ORDER — GLYCOPYRROLATE 0.2 MG/ML IJ SOLN
INTRAMUSCULAR | Status: DC | PRN
  Administered 2016-10-05: 0.1 mg via INTRAVENOUS

## 2016-10-05 MED ORDER — EPHEDRINE 5 MG/ML INJ
INTRAVENOUS | Status: AC
  Filled 2016-10-05: qty 10

## 2016-10-05 MED ORDER — FENTANYL CITRATE (PF) 100 MCG/2ML IJ SOLN
INTRAMUSCULAR | Status: DC | PRN
  Administered 2016-10-05: 50 ug via INTRAVENOUS
  Administered 2016-10-05: 100 ug via INTRAVENOUS

## 2016-10-05 SURGICAL SUPPLY — 25 items
BLADE SURG 15 STRL LF DISP TIS (BLADE) ×1 IMPLANT
BLADE SURG 15 STRL SS (BLADE) ×1
BUR CROSS CUT FISSURE 1.6 (BURR) ×2 IMPLANT
BUR EGG ELITE 4.0 (BURR) ×2 IMPLANT
CANISTER SUCT 3000ML PPV (MISCELLANEOUS) ×2 IMPLANT
COVER SURGICAL LIGHT HANDLE (MISCELLANEOUS) ×2 IMPLANT
GAUZE PACKING FOLDED 2  STR (GAUZE/BANDAGES/DRESSINGS) ×1
GAUZE PACKING FOLDED 2 STR (GAUZE/BANDAGES/DRESSINGS) ×1 IMPLANT
GLOVE BIO SURGEON STRL SZ 6.5 (GLOVE) ×2 IMPLANT
GLOVE BIO SURGEON STRL SZ7.5 (GLOVE) ×2 IMPLANT
GLOVE BIOGEL PI IND STRL 7.0 (GLOVE) ×1 IMPLANT
GLOVE BIOGEL PI INDICATOR 7.0 (GLOVE) ×1
GOWN STRL REUS W/ TWL LRG LVL3 (GOWN DISPOSABLE) ×1 IMPLANT
GOWN STRL REUS W/ TWL XL LVL3 (GOWN DISPOSABLE) ×1 IMPLANT
GOWN STRL REUS W/TWL LRG LVL3 (GOWN DISPOSABLE) ×1
GOWN STRL REUS W/TWL XL LVL3 (GOWN DISPOSABLE) ×1
KIT BASIN OR (CUSTOM PROCEDURE TRAY) ×2 IMPLANT
KIT ROOM TURNOVER OR (KITS) ×2 IMPLANT
NEEDLE 22X1 1/2 (OR ONLY) (NEEDLE) ×4 IMPLANT
NS IRRIG 1000ML POUR BTL (IV SOLUTION) ×2 IMPLANT
PAD ARMBOARD 7.5X6 YLW CONV (MISCELLANEOUS) ×2 IMPLANT
SUT CHROMIC 3 0 PS 2 (SUTURE) ×4 IMPLANT
TRAY ENT MC OR (CUSTOM PROCEDURE TRAY) ×2 IMPLANT
TUBING IRRIGATION (MISCELLANEOUS) ×2 IMPLANT
YANKAUER SUCT BULB TIP NO VENT (SUCTIONS) ×2 IMPLANT

## 2016-10-05 NOTE — Transfer of Care (Signed)
Immediate Anesthesia Transfer of Care Note  Patient: Max [name]  Procedure(s) Performed: Procedure(s): DENTAL EXTRACTIONS  Patient Location: PACU  Anesthesia Type:General  Level of Consciousness: awake and alert   Airway & Oxygen Therapy: Patient Spontanous Breathing and Patient connected to nasal cannula oxygen  Post-op Assessment: Report given to RN and Post -op Vital signs reviewed and stable  Post vital signs: Reviewed and stable  Last Vitals:  Vitals:   10/05/16 0651 10/05/16 1009  BP: 140/75   Pulse: 61   Resp: 18   Temp: 36.5 C (!) 36.4 C  SpO2: 98%     Last Pain:  Vitals:   10/05/16 0705  TempSrc:   PainSc: 4       Patients Stated Pain Goal: 2 (10/05/16 0705)  Complications: No apparent anesthesia complications

## 2016-10-05 NOTE — Anesthesia Procedure Notes (Signed)
Procedure Name: Intubation Date/Time: 10/05/2016 9:19 AM Performed by: Little IshikawaMERCER, Kyonna Frier L Pre-anesthesia Checklist: Patient identified, Emergency Drugs available, Suction available and Patient being monitored Patient Re-evaluated:Patient Re-evaluated prior to induction Oxygen Delivery Method: Circle System Utilized Preoxygenation: Pre-oxygenation with 100% oxygen Induction Type: IV induction Ventilation: Mask ventilation without difficulty Laryngoscope Size: Glidescope and 4 Grade View: Grade I Nasal Tubes: Nasal Rae, Nasal prep performed and Right Tube size: 6.5 mm Number of attempts: 1 Placement Confirmation: ETT inserted through vocal cords under direct vision,  positive ETCO2 and breath sounds checked- equal and bilateral Tube secured with: Tape Dental Injury: Teeth and Oropharynx as per pre-operative assessment

## 2016-10-05 NOTE — Op Note (Signed)
10/05/2016  9:57 AM  PATIENT:  Max [name]  66 y.o. male  PRE-OPERATIVE DIAGNOSIS:  NON RESTORABLE TEETH # 3, 6, 7, 8, 9, 10, 11, 21, 22, 23, 24, 25, 26  POST-OPERATIVE DIAGNOSIS:  SAME  PROCEDURE:  Procedure(s): DENTAL EXTRACTIONSTEETH # 3, 6, 7, 8, 9, 10, 11, 21, 22, 23, 24, 25, 26;  ALVEOLOPLASTY RIGHT AND LEFT MAXILLA AND MANDIBLE   SURGEON:  Surgeon(s): Ocie DoyneJensen, Noheli Melder, DDS  ANESTHESIA:   local and general  EBL:  minimal  DRAINS: none   SPECIMEN:  No Specimen  COUNTS:  YES  PLAN OF CARE: Discharge to home after PACU  PATIENT DISPOSITION:  PACU - hemodynamically stable.   PROCEDURE DETAILS: Dictation # [id number]  Max [name], DMD 10/05/2016 9:57 AM

## 2016-10-05 NOTE — H&P (Signed)
H&P documentation  -History and Physical Reviewed  -Patient has been re-examined  -No change in the plan of care  Max [name] M  

## 2016-10-05 NOTE — Anesthesia Preprocedure Evaluation (Addendum)
Anesthesia Evaluation  Patient identified by MRN, date of birth, ID band Patient awake    Reviewed: Allergy & Precautions, NPO status , Patient's Chart, lab work & pertinent test results  History of Anesthesia Complications (+) history of anesthetic complications  Airway Mallampati: II  TM Distance: >3 FB Neck ROM: Full    Dental no notable dental hx. (+) Poor Dentition, Chipped, Loose, Missing, Dental Advisory Given   Pulmonary sleep apnea, Continuous Positive Airway Pressure Ventilation and Oxygen sleep apnea , pneumonia, resolved, COPD,  COPD inhaler and oxygen dependent, Current Smoker,    Pulmonary exam normal breath sounds clear to auscultation       Cardiovascular hypertension, Pt. 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   Neuro/Psych PSYCHIATRIC DISORDERS Anxiety Depression  Neuromuscular disease    GI/Hepatic GERD  Medicated and Controlled,(+) Hepatitis -, BHx/o gastric outlet obstruction   Endo/Other  Obesity  Renal/GU Renal disease  negative genitourinary   Musculoskeletal  (+) Arthritis , Osteoarthritis,  Chronic back pain Chronic knee pain   Abdominal (+) + obese,   Peds  Hematology negative hematology ROS (+)   Anesthesia Other Findings   Reproductive/Obstetrics                             Anesthesia Physical Anesthesia Plan  ASA: III  Anesthesia Plan: General   Post-op Pain Management:    Induction: Intravenous  PONV Risk Score and Plan: 2 and Ondansetron, Promethazine and Treatment may vary due to age or medical condition  Airway Management Planned: Nasal ETT  Additional Equipment:   Intra-op Plan:   Post-operative Plan: Extubation in OR  Informed Consent: I have reviewed the patients History and Physical, chart, labs and discussed the procedure  including the risks, benefits and alternatives for the proposed anesthesia with the patient or authorized representative who has indicated his/her understanding and acceptance.   Dental advisory given  Plan Discussed with: CRNA, Anesthesiologist and Surgeon  Anesthesia Plan Comments: (Use Etomidate for induction.)        Anesthesia Quick Evaluation

## 2016-10-05 NOTE — Anesthesia Postprocedure Evaluation (Signed)
Anesthesia Post Note  Patient: Max SimmerJerry D Bondarenko  Procedure(s) Performed: Procedure(s): DENTAL EXTRACTIONS     Patient location during evaluation: PACU Anesthesia Type: General Level of consciousness: awake Pain management: pain level controlled Vital Signs Assessment: post-procedure vital signs reviewed and stable Respiratory status: spontaneous breathing Cardiovascular status: stable Anesthetic complications: no    Last Vitals:  Vitals:   10/05/16 1029 10/05/16 1033  BP: 117/74 111/76  Pulse: 83 84  Resp: 14 14  Temp:  36.5 C  SpO2: 93% 93%    Last Pain:  Vitals:   10/05/16 1033  TempSrc:   PainSc: Asleep                 Jonothan Heberle

## 2016-10-05 NOTE — Op Note (Signed)
NAME:  Max [name], Max [name]                ACCOUNT NO.:  [id number]  MEDICAL RECORD NO.:  [id number]  LOCATION:                                 FACILITY:  PHYSICIAN:  Georgia LopesScott M. Dorothymae Maciver, M.D.       DATE OF BIRTH:  DATE OF PROCEDURE:  10/05/2016 DATE OF DISCHARGE:                              OPERATIVE REPORT   PREOPERATIVE DIAGNOSIS:  Nonrestorable teeth numbers 3, 6, 7, 8, 9, 10, 11, 21, 22, 23, 24, 25, 26.  POSTOPERATIVE DIAGNOSIS:  Nonrestorable teeth numbers 3, 6, 7, 8, 9, 10, 11, 21, 22, 23, 24, 25, 26.  PROCEDURES: 1. Extraction of teeth numbers 3, 6, 7, 8, 9, 10, 11, 21, 22, 23, 24,     25, 26. 2. Alveoplasty, right and left maxilla and mandible.  SURGEON:  Georgia LopesScott M. Donel Osowski, M.D.  ANESTHESIA:  General, Judie Petitharlene Edwards, M.D., attending, nasal intubation.  DESCRIPTION OF PROCEDURE:  The patient was taken to the operating room, placed on the table in supine position.  General anesthesia was administered and a nasal endotracheal tube was placed and secured.  The eyes were protected and the patient was draped for the procedure.  Time- out was performed.  The posterior pharynx was suctioned.  A throat pack was placed.  Lidocaine 2% with 1:100,000 epinephrine was infiltrated in an inferior alveolar block on the right and left sides and then buccal and palatal infiltration around the maxillary areas where the teeth were to be removed.  Bite block was placed on the right side of the mouth and a Sweetheart retractor was used to retract the tongue.  A #15 blade was used to make an incision, beginning at tooth #21, approximately 1 cm distal to this tooth and then carried far in the gingival sulcus around teeth numbers 22, 23, 24, 25, and 26 in the maxilla.  The incision was created approximately 1 cm distal to tooth #11 and carried forward to tooth #6 in the gingival sulcus on the palatal and buccal sides.  The periosteum was reflected from these teeth.  The teeth were elevated with a 301  elevator.  The lower teeth were removed with the Asch forceps. Teeth numbers 11 required removal of bone circumferentially with a Stryker handpiece.  Tooth #9 and #10 were removed with the Universal forceps as were 7 and 8, however, tooth #9 fractured upon removal and a root tip was removed by removing additional bone with a Stryker handpiece.  Then, the sockets were curetted.  The periosteum was reflected to expose the alveolar crest in the left maxilla and mandible and a bone file and egg-shaped burr were used to perform alveoplasty. Then, the areas were irrigated and closed with 3-0 chromic.  The bite block and Sweetheart retractor were repositioned to the other side of the mouth and a 15 blade was used to make an incision around tooth #3, carried forward to tooth #6 in the maxilla, and then the periosteal elevator was used to reflect the periosteum, and the teeth were elevated and attempted removal with a forceps.  #3 fractured, requiring sectioning of the roots, and tooth #6 required removal of additional bone with a Stryker handpiece  under irrigation and this tooth was removed with a forceps.  Then, the periosteum was further reflected to expose the alveolar crest of the right maxilla.  Alveoplasty was performed using the egg-shaped bur and bone file.  Then, the area was irrigated and closed with 3-0 chromic.  The oral cavity was inspected and found to have good contour, hemostasis, and closure.  The oral cavity was irrigated and suctioned.  A throat pack was removed.  The patient was left in the care of Anesthesia Service for awakening, extubation, and transportation to the recovery room, and discharge home through Day Surgery.  ESTIMATED BLOOD LOSS:  Minimal.  COMPLICATIONS:  None.  SPECIMENS:  None.     Georgia Lopes, M.D.   ______________________________ Georgia Lopes, M.D.    SMJ/MEDQ  D:  10/05/2016  T:  10/05/2016  Job:  [id number]

## 2016-10-06 ENCOUNTER — Encounter (HOSPITAL_COMMUNITY): Payer: Self-pay | Admitting: Oral Surgery

## 2016-11-15 ENCOUNTER — Ambulatory Visit: Payer: Medicare HMO | Admitting: Orthopaedic Surgery

## 2016-11-21 ENCOUNTER — Ambulatory Visit: Payer: Medicare HMO | Admitting: Orthopaedic Surgery

## 2016-11-29 ENCOUNTER — Ambulatory Visit: Payer: Medicare HMO | Admitting: Orthopaedic Surgery

## 2021-06-19 ENCOUNTER — Other Ambulatory Visit: Payer: Self-pay | Admitting: Gastroenterology

## 2021-06-19 ENCOUNTER — Other Ambulatory Visit (HOSPITAL_COMMUNITY): Payer: Self-pay | Admitting: Gastroenterology

## 2021-06-19 DIAGNOSIS — K2289 Other specified disease of esophagus: Secondary | ICD-10-CM

## 2021-08-03 ENCOUNTER — Other Ambulatory Visit (HOSPITAL_COMMUNITY): Payer: Self-pay | Admitting: Gastroenterology

## 2021-08-03 DIAGNOSIS — K2289 Other specified disease of esophagus: Secondary | ICD-10-CM

## 2021-10-09 ENCOUNTER — Emergency Department (HOSPITAL_COMMUNITY): Payer: Medicare Other

## 2021-10-09 ENCOUNTER — Other Ambulatory Visit: Payer: Self-pay

## 2021-10-09 ENCOUNTER — Emergency Department (HOSPITAL_COMMUNITY)
Admission: EM | Admit: 2021-10-09 | Discharge: 2021-10-10 | Disposition: A | Discharge status: Home / Self Care | Payer: Medicare Other | Attending: Emergency Medicine | Admitting: Emergency Medicine

## 2021-10-09 ENCOUNTER — Encounter (HOSPITAL_COMMUNITY): Payer: Self-pay

## 2021-10-09 DIAGNOSIS — Z79899 Other long term (current) drug therapy: Secondary | ICD-10-CM | POA: Insufficient documentation

## 2021-10-09 DIAGNOSIS — M546 Pain in thoracic spine: Secondary | ICD-10-CM | POA: Diagnosis present

## 2021-10-09 DIAGNOSIS — R109 Unspecified abdominal pain: Secondary | ICD-10-CM | POA: Diagnosis not present

## 2021-10-09 LAB — CBC WITH DIFFERENTIAL/PLATELET
Abs Immature Granulocytes: 0.06 10*3/uL (ref 0.00–0.07)
Basophils Absolute: 0.1 10*3/uL (ref 0.0–0.1)
Basophils Relative: 0 %
Eosinophils Absolute: 0 10*3/uL (ref 0.0–0.5)
Eosinophils Relative: 0 %
HCT: 42.3 % (ref 39.0–52.0)
Hemoglobin: 13.9 g/dL (ref 13.0–17.0)
Immature Granulocytes: 0 %
Lymphocytes Relative: 13 %
Lymphs Abs: 2 10*3/uL (ref 0.7–4.0)
MCH: 30.6 pg (ref 26.0–34.0)
MCHC: 32.9 g/dL (ref 30.0–36.0)
MCV: 93.2 fL (ref 80.0–100.0)
Monocytes Absolute: 2.2 10*3/uL — ABNORMAL HIGH (ref 0.1–1.0)
Monocytes Relative: 15 %
Neutro Abs: 10.5 10*3/uL — ABNORMAL HIGH (ref 1.7–7.7)
Neutrophils Relative %: 72 %
Platelets: 198 10*3/uL (ref 150–400)
RBC: 4.54 MIL/uL (ref 4.22–5.81)
RDW: 14.9 % (ref 11.5–15.5)
WBC: 14.8 10*3/uL — ABNORMAL HIGH (ref 4.0–10.5)
nRBC: 0 % (ref 0.0–0.2)

## 2021-10-09 LAB — COMPREHENSIVE METABOLIC PANEL
ALT: 14 U/L (ref 0–44)
AST: 13 U/L — ABNORMAL LOW (ref 15–41)
Albumin: 3.2 g/dL — ABNORMAL LOW (ref 3.5–5.0)
Alkaline Phosphatase: 77 U/L (ref 38–126)
Anion gap: 9 (ref 5–15)
BUN: 15 mg/dL (ref 8–23)
CO2: 29 mmol/L (ref 22–32)
Calcium: 9.1 mg/dL (ref 8.9–10.3)
Chloride: 97 mmol/L — ABNORMAL LOW (ref 98–111)
Creatinine, Ser: 1 mg/dL (ref 0.61–1.24)
GFR, Estimated: >60 mL/min (ref >60)
Glucose, Bld: 134 mg/dL — ABNORMAL HIGH (ref 70–99)
Potassium: 4.2 mmol/L (ref 3.5–5.1)
Sodium: 135 mmol/L (ref 135–145)
Total Bilirubin: 0.8 mg/dL (ref 0.3–1.2)
Total Protein: 7.4 g/dL (ref 6.5–8.1)

## 2021-10-09 LAB — LIPASE, BLOOD: Lipase: 21 U/L (ref 11–51)

## 2021-10-09 MED ORDER — HYDROMORPHONE HCL 1 MG/ML IJ SOLN
1.0000 mg | Freq: Once | INTRAMUSCULAR | Status: AC
  Administered 2021-10-10: 1 mg via INTRAVENOUS
  Filled 2021-10-09: qty 1

## 2021-10-09 MED ORDER — DIAZEPAM 2 MG PO TABS
2.0000 mg | ORAL_TABLET | Freq: Once | ORAL | Status: AC
  Administered 2021-10-10: 2 mg via ORAL
  Filled 2021-10-09: qty 1

## 2021-10-09 MED ORDER — LACTATED RINGERS IV BOLUS
1000.0000 mL | Freq: Once | INTRAVENOUS | Status: AC
  Administered 2021-10-10: 1000 mL via INTRAVENOUS

## 2021-10-09 NOTE — ED Provider Notes (Incomplete)
[facility] EMERGENCY DEPARTMENT Provider Note   CSN: [id number] Arrival date & time: 10/09/21  1650     History {Add pertinent medical, surgical, social history, OB history to HPI:1} Chief Complaint  Patient presents with  . Flank Pain    RODERIC LAMMERT is a 71 y.o. male.  71 yo M here with bilateral flank pain. States he had slept in a recliner one night and woke up with severe bilateral back pain laterally.    Flank Pain       Home Medications Prior to Admission medications   Medication Sig Start Date End Date Taking? Authorizing Provider  albuterol (PROVENTIL HFA;VENTOLIN HFA) 108 (90 BASE) MCG/ACT inhaler Inhale 2 puffs into the lungs every 4 (four) hours as needed for wheezing or shortness of breath. 09/20/13   Eber Hong, MD  albuterol (PROVENTIL) (2.5 MG/3ML) 0.083% nebulizer solution Take 2.5 mg by nebulization every 6 (six) hours as needed for wheezing or shortness of breath.    [provider]  allopurinol (ZYLOPRIM) 100 MG tablet Take 100 mg by mouth daily.     [provider]  b complex vitamins tablet Take 1 tablet by mouth daily.    [provider]  busPIRone (BUSPAR) 5 MG tablet Take 5 mg by mouth 3 (three) times daily.    [provider]  carisoprodol (SOMA) 350 MG tablet Take 350 mg by mouth 3 (three) times daily as needed for muscle spasms.    [provider]  escitalopram (LEXAPRO) 20 MG tablet Take 20 mg by mouth daily.     [provider]  gabapentin (NEURONTIN) 600 MG tablet Take 1,800 mg by mouth 3 (three) times daily.  04/07/15   [provider]  HYDROmorphone (DILAUDID) 2 MG tablet Take 1 tablet (2 mg total) by mouth every 4 (four) hours as needed for severe pain. 10/05/16   Ocie Doyne, DMD  ibuprofen (ADVIL,MOTRIN) 800 MG tablet Take 1 tablet (800 mg total) by mouth every 8 (eight) hours as needed. 10/05/16   Ocie Doyne, DMD  lisinopril-hydrochlorothiazide  (PRINZIDE,ZESTORETIC) 10-12.5 MG tablet Take 1 tablet by mouth daily.    [provider]  loperamide (IMODIUM A-D) 2 MG tablet Take 4 mg by mouth 3 (three) times daily as needed for diarrhea or loose stools.    [provider]  Melatonin 5 MG TABS Take 10 mg by mouth at bedtime as needed (sleep).     [provider]  Multiple Vitamin (MULTIVITAMIN WITH MINERALS) TABS tablet Take 1 tablet by mouth daily.    [provider]  Oxycodone HCl 10 MG TABS Take 10 mg by mouth every 6 (six) hours as needed for pain. 09/26/16   [provider]  oxyCODONE-acetaminophen (PERCOCET) 7.5-325 MG tablet Take 1 tablet by mouth every 6 (six) hours as needed for moderate pain or severe pain (Must last 14 days.  Do not take and drive a car or operate machinery). Patient not taking: Reported on 10/03/2016 07/16/16   Darreld Mclean, MD  OXYGEN Inhale into the lungs. Uses 2 liters at hs and prn during day    [provider]  pantoprazole (PROTONIX) 40 MG tablet Take 1 tablet (40 mg total) by mouth daily at 12 noon. Patient taking differently: Take 40 mg by mouth daily.  11/08/13   Parrett, Virgel Bouquet, NP  sucralfate (CARAFATE) 1 g tablet Take 1 g by mouth 3 (three) times daily.    [provider]  zolpidem (AMBIEN) 10  MG tablet Take 1 tablet (10 mg total) by mouth at bedtime as needed for sleep. Patient taking differently: Take 10 mg by mouth at bedtime.  11/08/13   Parrett, Virgel Bouquet, NP      Allergies    Heparin, Flexeril [cyclobenzaprine hcl], Propofol, and Other    Review of Systems   Review of Systems  Genitourinary:  Positive for flank pain.    Physical Exam Updated Vital Signs BP 109/75 (BP Location: Left Arm)   Pulse 81   Temp 97.7 F (36.5 C) (Oral)   Resp 20   Ht 5\' 10"  (1.778 m)   Wt 117.9 kg   SpO2 98%   BMI 37.31 kg/m  Physical Exam  ED Results / Procedures / Treatments   Labs (all labs ordered are listed, but only abnormal results are  displayed) Labs Reviewed  COMPREHENSIVE METABOLIC PANEL - Abnormal; Notable for the following components:      Result Value   Chloride 97 (*)    Glucose, Bld 134 (*)    Albumin 3.2 (*)    AST 13 (*)    All other components within normal limits  CBC WITH DIFFERENTIAL/PLATELET - Abnormal; Notable for the following components:   WBC 14.8 (*)    Neutro Abs 10.5 (*)    Monocytes Absolute 2.2 (*)    All other components within normal limits  LIPASE, BLOOD  URINALYSIS, ROUTINE W REFLEX MICROSCOPIC    EKG None  Radiology CT Renal Stone Study  Result Date: 10/09/2021 CLINICAL DATA:  Bilateral flank pain EXAM: CT ABDOMEN AND PELVIS WITHOUT CONTRAST TECHNIQUE: Multidetector CT imaging of the abdomen and pelvis was performed following the standard protocol without IV contrast. RADIATION DOSE REDUCTION: This exam was performed according to the departmental dose-optimization program which includes automated exposure control, adjustment of the mA and/or kV according to patient size and/or use of iterative reconstruction technique. COMPARISON:  07/02/2021 FINDINGS: Lower chest: Fibrosis in the lung bases. No acute process. No effusions. Hepatobiliary: No focal hepatic abnormality. Gallbladder unremarkable. Pancreas: No focal abnormality or ductal dilatation. Spleen: No focal abnormality.  Normal size. Adrenals/Urinary Tract: No adrenal abnormality. No focal renal abnormality. No stones or hydronephrosis. Urinary bladder is unremarkable. Stomach/Bowel: Tortuous colon. Large stool burden throughout the colon. Stomach and small bowel decompressed. No evidence of bowel obstruction. Normal appendix. Vascular/Lymphatic: Aortic atherosclerosis. No evidence of aneurysm or adenopathy. Reproductive: No visible focal abnormality. Other: No free fluid or free air. Musculoskeletal: No acute bony abnormality. IMPRESSION: No renal or ureteral stones.  No hydronephrosis. Large stool burden throughout the colon. Aortic  atherosclerosis. Pulmonary fibrosis in the visualized lung bases. Electronically Signed   By: 09/01/2021 M.D.   On: 10/09/2021 19:20    Procedures Procedures  {Document cardiac monitor, telemetry assessment procedure when appropriate:1}  Medications Ordered in ED Medications  lactated ringers bolus 1,000 mL (has no administration in time range)  HYDROmorphone (DILAUDID) injection 1 mg (has no administration in time range)  diazepam (VALIUM) tablet 2 mg (has no administration in time range)    ED Course/ Medical Decision Making/ A&P                           Medical Decision Making Amount and/or Complexity of Data Reviewed Radiology: ordered.  Risk Prescription drug management.   ***  {Document critical care time when appropriate:1} {Document review of labs and clinical decision tools ie heart score, Chads2Vasc2 etc:1}  {Document your independent review  of radiology images, and any outside records:1} {Document your discussion with family members, caretakers, and with consultants:1} {Document social determinants of health affecting pt's care:1} {Document your decision making why or why not admission, treatments were needed:1} Final Clinical Impression(s) / ED Diagnoses Final diagnoses:  None    Rx / DC Orders ED Discharge Orders     None

## 2021-10-09 NOTE — ED Provider Notes (Signed)
[facility] EMERGENCY DEPARTMENT Provider Note   CSN: [id number] Arrival date & time: 10/09/21  1650     History  Chief Complaint  Patient presents with   Flank Pain    Max [name] is a 71 y.o. male.  71 yo M here with bilateral flank pain. States he had slept in a recliner one night and woke up with severe bilateral back pain laterally. Has had it multiple days, worse with movement and walking. No urinary symptoms. No emesis. No other trauma. BM every other day which is normal for him. On oxycodone 15mg  five times a day chronically.    Flank Pain       Home Medications Prior to Admission medications   Medication Sig Start Date End Date Taking? Authorizing Provider  diazepam (VALIUM) 5 MG tablet Take 0.5 tablets (2.5 mg total) by mouth every 6 (six) hours as needed for anxiety (spasms). 10/10/21  Yes Kailer Heindel, 12/10/21, MD  albuterol (PROVENTIL HFA;VENTOLIN HFA) 108 (90 BASE) MCG/ACT inhaler Inhale 2 puffs into the lungs every 4 (four) hours as needed for wheezing or shortness of breath. 09/20/13   09/22/13, MD  albuterol (PROVENTIL) (2.5 MG/3ML) 0.083% nebulizer solution Take 2.5 mg by nebulization every 6 (six) hours as needed for wheezing or shortness of breath.    [provider]  allopurinol (ZYLOPRIM) 100 MG tablet Take 100 mg by mouth daily.     [provider]  b complex vitamins tablet Take 1 tablet by mouth daily.    [provider]  busPIRone (BUSPAR) 5 MG tablet Take 5 mg by mouth 3 (three) times daily.    [provider]  carisoprodol (SOMA) 350 MG tablet Take 350 mg by mouth 3 (three) times daily as needed for muscle spasms.    [provider]  escitalopram (LEXAPRO) 20 MG tablet Take 20 mg by mouth daily.     [provider]  gabapentin (NEURONTIN) 600 MG tablet Take 1,800 mg by mouth 3 (three) times daily.  04/07/15   [provider]  HYDROmorphone (DILAUDID) 2 MG tablet Take 1 tablet  (2 mg total) by mouth every 4 (four) hours as needed for severe pain. 10/05/16   12/05/16, DMD  ibuprofen (ADVIL,MOTRIN) 800 MG tablet Take 1 tablet (800 mg total) by mouth every 8 (eight) hours as needed. 10/05/16   12/05/16, DMD  lisinopril-hydrochlorothiazide (PRINZIDE,ZESTORETIC) 10-12.5 MG tablet Take 1 tablet by mouth daily.    [provider]  loperamide (IMODIUM A-D) 2 MG tablet Take 4 mg by mouth 3 (three) times daily as needed for diarrhea or loose stools.    [provider]  Melatonin 5 MG TABS Take 10 mg by mouth at bedtime as needed (sleep).     [provider]  Multiple Vitamin (MULTIVITAMIN WITH MINERALS) TABS tablet Take 1 tablet by mouth daily.    [provider]  Oxycodone HCl 10 MG TABS Take 10 mg by mouth every 6 (six) hours as needed for pain. 09/26/16   [provider]  oxyCODONE-acetaminophen (PERCOCET) 7.5-325 MG tablet Take 1 tablet by mouth every 6 (six) hours as needed for moderate pain or severe pain (Must last 14 days.  Do not take and drive a car or operate machinery). Patient not taking: Reported on 10/03/2016 07/16/16   07/18/16, MD  OXYGEN Inhale into the lungs. Uses 2 liters at hs and prn during day    [provider]  pantoprazole (PROTONIX) 40 MG  tablet Take 1 tablet (40 mg total) by mouth daily at 12 noon. Patient taking differently: Take 40 mg by mouth daily.  11/08/13   Parrett, Virgel Bouquet, NP  sucralfate (CARAFATE) 1 g tablet Take 1 g by mouth 3 (three) times daily.    [provider]  zolpidem (AMBIEN) 10 MG tablet Take 1 tablet (10 mg total) by mouth at bedtime as needed for sleep. Patient taking differently: Take 10 mg by mouth at bedtime.  11/08/13   Parrett, Virgel Bouquet, NP      Allergies    Heparin, Flexeril [cyclobenzaprine hcl], Propofol, and Other    Review of Systems   Review of Systems  Genitourinary:  Positive for flank pain.    Physical Exam Updated Vital Signs BP 133/85    Pulse 79   Temp 98.2 F (36.8 C) (Oral)   Resp 18   Ht 5\' 10"  (1.778 m)   Wt 117.9 kg   SpO2 96%   BMI 37.31 kg/m  Physical Exam Vitals and nursing note reviewed.  Constitutional:      Appearance: He is well-developed.  HENT:     Head: Normocephalic and atraumatic.  Cardiovascular:     Rate and Rhythm: Normal rate.  Pulmonary:     Effort: Pulmonary effort is normal. No respiratory distress.  Abdominal:     General: There is no distension.  Musculoskeletal:        General: Tenderness (bilateral lateral thoracic area ttp over lower ribs no bruising or rash) present. Normal range of motion.     Cervical back: Normal range of motion.  Neurological:     Mental Status: He is alert.     ED Results / Procedures / Treatments   Labs (all labs ordered are listed, but only abnormal results are displayed) Labs Reviewed  COMPREHENSIVE METABOLIC PANEL - Abnormal; Notable for the following components:      Result Value   Chloride 97 (*)    Glucose, Bld 134 (*)    Albumin 3.2 (*)    AST 13 (*)    All other components within normal limits  CBC WITH DIFFERENTIAL/PLATELET - Abnormal; Notable for the following components:   WBC 14.8 (*)    Neutro Abs 10.5 (*)    Monocytes Absolute 2.2 (*)    All other components within normal limits  URINALYSIS, ROUTINE W REFLEX MICROSCOPIC  LIPASE, BLOOD    EKG None  Radiology DG Chest Portable 1 View  Result Date: 10/10/2021 CLINICAL DATA:  Evaluate for pneumonia EXAM: PORTABLE CHEST 1 VIEW COMPARISON:  07/06/2015 FINDINGS: Heart is borderline in size. Bilateral airspace disease could reflect edema or infection. No visible effusions. No acute bony abnormality. IMPRESSION: Bilateral airspace disease could reflect edema or infection. Electronically Signed   By: 09/05/2015 M.D.   On: 10/10/2021 00:20   CT Renal Stone Study  Result Date: 10/09/2021 CLINICAL DATA:  Bilateral flank pain EXAM: CT ABDOMEN AND PELVIS WITHOUT CONTRAST TECHNIQUE:  Multidetector CT imaging of the abdomen and pelvis was performed following the standard protocol without IV contrast. RADIATION DOSE REDUCTION: This exam was performed according to the departmental dose-optimization program which includes automated exposure control, adjustment of the mA and/or kV according to patient size and/or use of iterative reconstruction technique. COMPARISON:  07/02/2021 FINDINGS: Lower chest: Fibrosis in the lung bases. No acute process. No effusions. Hepatobiliary: No focal hepatic abnormality. Gallbladder unremarkable. Pancreas: No focal abnormality or ductal dilatation. Spleen: No focal abnormality.  Normal size. Adrenals/Urinary Tract: No  adrenal abnormality. No focal renal abnormality. No stones or hydronephrosis. Urinary bladder is unremarkable. Stomach/Bowel: Tortuous colon. Large stool burden throughout the colon. Stomach and small bowel decompressed. No evidence of bowel obstruction. Normal appendix. Vascular/Lymphatic: Aortic atherosclerosis. No evidence of aneurysm or adenopathy. Reproductive: No visible focal abnormality. Other: No free fluid or free air. Musculoskeletal: No acute bony abnormality. IMPRESSION: No renal or ureteral stones.  No hydronephrosis. Large stool burden throughout the colon. Aortic atherosclerosis. Pulmonary fibrosis in the visualized lung bases. Electronically Signed   By: Charlett Nose M.D.   On: 10/09/2021 19:20    Procedures Procedures    Medications Ordered in ED Medications  lactated ringers bolus 1,000 mL (0 mLs Intravenous Stopped 10/10/21 0159)  HYDROmorphone (DILAUDID) injection 1 mg (1 mg Intravenous Given 10/10/21 0032)  diazepam (VALIUM) tablet 2 mg (2 mg Oral Given 10/10/21 0027)    ED Course/ Medical Decision Making/ A&P                           Medical Decision Making Amount and/or Complexity of Data Reviewed Radiology: ordered.  Risk Prescription drug management.   No e/o stone, UTI/pyelo, fracture, pneumonia as causes  for pain. As it started after sleeping in the recliner which he usually doesn't and is now worse with palpation and movement I suspect more muscular causes for symptoms. Has pain contract and on benzo's with [facility] so will discuss with them if it is ok to start the valium in addition to all his other sedating medications prior to get medication filled.   Final Clinical Impression(s) / ED Diagnoses Final diagnoses:  Acute bilateral thoracic back pain    Rx / DC Orders ED Discharge Orders          Ordered    diazepam (VALIUM) 5 MG tablet  Every 6 hours PRN        10/10/21 0205              Makiyla Linch, Barbara Cower, MD 10/10/21 [phone]

## 2021-10-09 NOTE — ED Triage Notes (Signed)
Complains of bilateral flank pain and difficulty standing up and having to sleep in recliner.  Patient denies urinary symptoms.

## 2021-10-09 NOTE — ED Notes (Signed)
Oxygen tank changed 

## 2021-10-09 NOTE — ED Provider Triage Note (Signed)
Emergency Medicine Provider Triage Evaluation Note  Max [name] , a 71 y.o. male  was evaluated in triage.  Pt with history of COPD on home O2 complains of bilateral flank pain left side that started about 3 to 4 days ago.  Patient states pain is worse with movement, palpation.  Pain is severe and constant.  He denies fevers, dysuria, hematuria, nausea, vomiting and diarrhea.  Review of Systems  Positive:  Negative:   Physical Exam  BP 111/73 (BP Location: Right Arm)   Pulse 88   Temp 97.9 F (36.6 C) (Oral)   Resp 18   Ht 5\' 10"  (1.778 m)   Wt 117.9 kg   SpO2 91%   BMI 37.31 kg/m  Gen:   Awake, no distress   Resp:  Normal effort, on home nasal cannula  MSK:   Moves extremities without difficulty  Other:  Tenderness to bilateral flanks with positive left-sided CVA tenderness  Medical Decision Making  Medically screening exam initiated at 5:43 PM.  Appropriate orders placed.  Max [name] was informed that the remainder of the evaluation will be completed by another provider, this initial triage assessment does not replace that evaluation, and the importance of remaining in the ED until their evaluation is complete.     Max [name], Max [name] 10/09/21 1745

## 2021-10-10 ENCOUNTER — Emergency Department (HOSPITAL_COMMUNITY): Payer: Medicare Other

## 2021-10-10 DIAGNOSIS — M546 Pain in thoracic spine: Secondary | ICD-10-CM | POA: Diagnosis not present

## 2021-10-10 LAB — URINALYSIS, ROUTINE W REFLEX MICROSCOPIC
Bilirubin Urine: NEGATIVE
Glucose, UA: NEGATIVE mg/dL
Hgb urine dipstick: NEGATIVE
Ketones, ur: NEGATIVE mg/dL
Leukocytes,Ua: NEGATIVE
Nitrite: NEGATIVE
Protein, ur: NEGATIVE mg/dL
Specific Gravity, Urine: 1.019 (ref 1.005–1.030)
pH: 5 (ref 5.0–8.0)

## 2021-10-10 MED ORDER — DIAZEPAM 5 MG PO TABS: 2.5000 mg | ORAL_TABLET | Freq: Four times a day (QID) | ORAL | 0 refills | Status: AC | PRN

## 2021-10-27 ENCOUNTER — Encounter (HOSPITAL_BASED_OUTPATIENT_CLINIC_OR_DEPARTMENT_OTHER): Payer: Self-pay

## 2021-10-27 ENCOUNTER — Other Ambulatory Visit: Payer: Self-pay

## 2021-10-27 ENCOUNTER — Emergency Department (HOSPITAL_BASED_OUTPATIENT_CLINIC_OR_DEPARTMENT_OTHER)
Admission: EM | Admit: 2021-10-27 | Discharge: 2021-10-27 | Disposition: A | Discharge status: Home / Self Care | Payer: Medicare Other | Attending: Emergency Medicine | Admitting: Emergency Medicine

## 2021-10-27 ENCOUNTER — Emergency Department (HOSPITAL_BASED_OUTPATIENT_CLINIC_OR_DEPARTMENT_OTHER): Payer: Medicare Other

## 2021-10-27 DIAGNOSIS — Z79899 Other long term (current) drug therapy: Secondary | ICD-10-CM | POA: Diagnosis not present

## 2021-10-27 DIAGNOSIS — M25531 Pain in right wrist: Secondary | ICD-10-CM | POA: Diagnosis present

## 2021-10-27 DIAGNOSIS — M10031 Idiopathic gout, right wrist: Secondary | ICD-10-CM | POA: Diagnosis not present

## 2021-10-27 DIAGNOSIS — D72829 Elevated white blood cell count, unspecified: Secondary | ICD-10-CM | POA: Insufficient documentation

## 2021-10-27 DIAGNOSIS — J449 Chronic obstructive pulmonary disease, unspecified: Secondary | ICD-10-CM | POA: Diagnosis not present

## 2021-10-27 DIAGNOSIS — I10 Essential (primary) hypertension: Secondary | ICD-10-CM | POA: Diagnosis not present

## 2021-10-27 LAB — BASIC METABOLIC PANEL WITH GFR
Anion gap: 9 (ref 5–15)
BUN: 16 mg/dL (ref 8–23)
CO2: 30 mmol/L (ref 22–32)
Calcium: 8.7 mg/dL — ABNORMAL LOW (ref 8.9–10.3)
Chloride: 97 mmol/L — ABNORMAL LOW (ref 98–111)
Creatinine, Ser: 0.94 mg/dL (ref 0.61–1.24)
GFR, Estimated: >60 mL/min
Glucose, Bld: 128 mg/dL — ABNORMAL HIGH (ref 70–99)
Potassium: 4.8 mmol/L (ref 3.5–5.1)
Sodium: 136 mmol/L (ref 135–145)

## 2021-10-27 LAB — CBC
HCT: 40.2 % (ref 39.0–52.0)
Hemoglobin: 13.1 g/dL (ref 13.0–17.0)
MCH: 29.7 pg (ref 26.0–34.0)
MCHC: 32.6 g/dL (ref 30.0–36.0)
MCV: 91.2 fL (ref 80.0–100.0)
Platelets: 312 K/uL (ref 150–400)
RBC: 4.41 MIL/uL (ref 4.22–5.81)
RDW: 15.1 % (ref 11.5–15.5)
WBC: 14.5 K/uL — ABNORMAL HIGH (ref 4.0–10.5)
nRBC: 0 % (ref 0.0–0.2)

## 2021-10-27 LAB — URIC ACID: Uric Acid, Serum: 4.1 mg/dL (ref 3.7–8.6)

## 2021-10-27 MED ORDER — OXYCODONE-ACETAMINOPHEN 5-325 MG PO TABS
1.0000 | ORAL_TABLET | Freq: Once | ORAL | Status: AC
  Administered 2021-10-27: 1 via ORAL
  Filled 2021-10-27: qty 1

## 2021-10-27 MED ORDER — INDOMETHACIN 25 MG PO CAPS
50.0000 mg | ORAL_CAPSULE | Freq: Once | ORAL | Status: AC
  Administered 2021-10-27: 50 mg via ORAL
  Filled 2021-10-27: qty 2

## 2021-10-27 NOTE — Progress Notes (Signed)
Patient with increase SOB in triage, however, patient came in without his oxygen due to it running out. Patient now on 3L and is breathing unlabored, regular pattern, SATs 95%. Patient has coarse crackles throughout all lung fields. Chest xray is ordered. RN aware.

## 2021-10-27 NOTE — ED Provider Notes (Signed)
[facility] EMERGENCY DEPARTMENT Provider Note   CSN: [id number] Arrival date & time: 10/27/21  1552     History  Chief Complaint  Patient presents with   Arm Swelling    Max [name] is a 71 y.o. male.  Patient presents to the hospital complaining of right wrist pain and swelling.  Patient states he woke up Thursday morning and noticed that his right hand appeared swollen and painful.  He states that his pain has increased over the past 24 to 36 hours.  He denies any fevers at home.  Denies any systemic symptoms.  Patient states he does have a history of gout and takes allopurinol daily.  He has never had gout in an upper extremity joint before that he knows of.  The patient states he has a history of chronic pain due to multiple degenerative spinal issues and postop issues.  He takes Roxicodone at home for chronic pain.  He states he took one of his oxycodones today with little relief.  The patient denies any injury or trauma to the area.  Past medical history significant for pulmonary fibrosis on 3 L/min oxygen via nasal cannula at baseline, sciatica, syncopal episodes, chronic back pain, chronic knee pain, COPD, arthritis, herniated disks, chronic pain, hypertension, gout   HPI     Home Medications Prior to Admission medications   Medication Sig Start Date End Date Taking? Authorizing Provider  albuterol (PROVENTIL HFA;VENTOLIN HFA) 108 (90 BASE) MCG/ACT inhaler Inhale 2 puffs into the lungs every 4 (four) hours as needed for wheezing or shortness of breath. 09/20/13   Noemi Chapel, MD  albuterol (PROVENTIL) (2.5 MG/3ML) 0.083% nebulizer solution Take 2.5 mg by nebulization every 6 (six) hours as needed for wheezing or shortness of breath.    [provider]  allopurinol (ZYLOPRIM) 100 MG tablet Take 100 mg by mouth daily.     [provider]  b complex vitamins tablet Take 1 tablet by mouth daily.    [provider]  busPIRone (BUSPAR) 5 MG  tablet Take 5 mg by mouth 3 (three) times daily.    [provider]  carisoprodol (SOMA) 350 MG tablet Take 350 mg by mouth 3 (three) times daily as needed for muscle spasms.    [provider]  diazepam (VALIUM) 5 MG tablet Take 0.5 tablets (2.5 mg total) by mouth every 6 (six) hours as needed for anxiety (spasms). 10/10/21   Mesner, Corene Cornea, MD  escitalopram (LEXAPRO) 20 MG tablet Take 20 mg by mouth daily.     [provider]  gabapentin (NEURONTIN) 600 MG tablet Take 1,800 mg by mouth 3 (three) times daily.  04/07/15   [provider]  HYDROmorphone (DILAUDID) 2 MG tablet Take 1 tablet (2 mg total) by mouth every 4 (four) hours as needed for severe pain. 10/05/16   Diona Browner, DMD  ibuprofen (ADVIL,MOTRIN) 800 MG tablet Take 1 tablet (800 mg total) by mouth every 8 (eight) hours as needed. 10/05/16   Diona Browner, DMD  lisinopril-hydrochlorothiazide (PRINZIDE,ZESTORETIC) 10-12.5 MG tablet Take 1 tablet by mouth daily.    [provider]  loperamide (IMODIUM A-D) 2 MG tablet Take 4 mg by mouth 3 (three) times daily as needed for diarrhea or loose stools.    [provider]  Melatonin 5 MG TABS Take 10 mg by mouth at bedtime as needed (sleep).     [provider]  Multiple Vitamin (MULTIVITAMIN WITH MINERALS) TABS tablet Take 1 tablet by mouth  daily.    [provider]  Oxycodone HCl 10 MG TABS Take 10 mg by mouth every 6 (six) hours as needed for pain. 09/26/16   [provider]  oxyCODONE-acetaminophen (PERCOCET) 7.5-325 MG tablet Take 1 tablet by mouth every 6 (six) hours as needed for moderate pain or severe pain (Must last 14 days.  Do not take and drive a car or operate machinery). Patient not taking: Reported on 10/03/2016 07/16/16   Darreld Mclean, MD  OXYGEN Inhale into the lungs. Uses 2 liters at hs and prn during day    [provider]  pantoprazole (PROTONIX) 40 MG tablet Take 1 tablet (40 mg total) by mouth  daily at 12 noon. Patient taking differently: Take 40 mg by mouth daily.  11/08/13   Parrett, Virgel Bouquet, NP  sucralfate (CARAFATE) 1 g tablet Take 1 g by mouth 3 (three) times daily.    [provider]  zolpidem (AMBIEN) 10 MG tablet Take 1 tablet (10 mg total) by mouth at bedtime as needed for sleep. Patient taking differently: Take 10 mg by mouth at bedtime.  11/08/13   Parrett, Virgel Bouquet, NP      Allergies    Heparin, Flexeril [cyclobenzaprine hcl], Propofol, and Other    Review of Systems   Review of Systems  Musculoskeletal:  Positive for arthralgias and joint swelling.    Physical Exam Updated Vital Signs BP 139/87   Pulse 84   Temp 98.5 F (36.9 C) (Oral)   Resp 17   Ht 5\' 10"  (1.778 m)   Wt 103.4 kg   SpO2 94%   BMI 32.71 kg/m  Physical Exam HENT:     Head: Normocephalic and atraumatic.  Eyes:     Pupils: Pupils are equal, round, and reactive to light.  Cardiovascular:     Rate and Rhythm: Normal rate.  Pulmonary:     Effort: Pulmonary effort is normal. No respiratory distress.     Comments: Coarse crackles throughout lung fields Musculoskeletal:        General: Swelling and tenderness present. No signs of injury.     Cervical back: Normal range of motion.     Comments: Right wrist with swelling. Tenderness near 1st [facility] joint. No warmth when compared to contralateral side. No erythema. Normal ROM  Skin:    General: Skin is warm and dry.     Capillary Refill: Capillary refill takes less than 2 seconds.  Neurological:     Mental Status: He is alert.  Psychiatric:        Speech: Speech normal.        Behavior: Behavior normal.     ED Results / Procedures / Treatments   Labs (all labs ordered are listed, but only abnormal results are displayed) Labs Reviewed  BASIC METABOLIC PANEL - Abnormal; Notable for the following components:      Result Value   Chloride 97 (*)    Glucose, Bld 128 (*)    Calcium 8.7 (*)    All other components within normal  limits  CBC - Abnormal; Notable for the following components:   WBC 14.5 (*)    All other components within normal limits  URIC ACID    EKG None  Radiology DG Chest Port 1 View  Result Date: 10/27/2021 CLINICAL DATA:  Swelling short of breath EXAM: PORTABLE CHEST 1 VIEW COMPARISON:  10/10/2021, 07/06/2015, 10/11/2017, chest CT 02/18/2015 FINDINGS: Cardiomegaly. Diffuse increased interstitial opacity with reticulation. No acute confluent airspace disease, pleural  effusion, or pneumothorax IMPRESSION: 1. Mild diffuse reticular interstitial opacity, suspect for chronic interstitial lung disease. No acute superimposed confluent airspace disease Electronically Signed   By: Jasmine Pang M.D.   On: 10/27/2021 16:44   DG Wrist Complete Right  Result Date: 10/27/2021 CLINICAL DATA:  Swelling EXAM: RIGHT WRIST - COMPLETE 3+ VIEW COMPARISON:  None Available. FINDINGS: No fracture or malalignment. Cartilaginous calcifications consistent with chondrocalcinosis. Slight widening of scapholunate interval up to 4.5 mm. Moderate arthritis at the radiocarpal joint. Mild arthritis at the first [facility] joint. Diffuse soft tissue swelling IMPRESSION: 1. No acute osseous abnormality 2. Arthritis at the radiocarpal joint and first [facility] joint 3. Slightly widened scapholunate interval suggesting ligamentous disease 4. Chondrocalcinosis Electronically Signed   By: Jasmine Pang M.D.   On: 10/27/2021 16:42    Procedures Procedures    Medications Ordered in ED Medications  oxyCODONE-acetaminophen (PERCOCET/ROXICET) 5-325 MG per tablet 1 tablet (1 tablet Oral Given 10/27/21 1636)  indomethacin (INDOCIN) capsule 50 mg (50 mg Oral Given 10/27/21 1721)    ED Course/ Medical Decision Making/ A&P                           Medical Decision Making Amount and/or Complexity of Data Reviewed Labs: ordered. Radiology: ordered.  Risk Prescription drug management.   This patient presents to the ED for concern of right wrist  pain and swelling, this involves an extensive number of treatment options, and is a complaint that carries with it a high risk of complications and morbidity.  The differential diagnosis includes gout, arthritis, septic arthritis, fracture, dislocation, and others   Co morbidities that complicate the patient evaluation  History of gout   Additional history obtained:  Additional history obtained from family at bedside   Lab Tests:  I Ordered, and personally interpreted labs.  The pertinent results include: Unremarkable BMP, uric acid, CBC with white blood cell count of 14.5 consistent with patient's baseline   Imaging Studies ordered:  I ordered imaging studies including plain films of the right wrist and chest x-ray I independently visualized and interpreted imaging which showed  Mild diffuse reticular interstitial opacity, suspect for chronic  interstitial lung disease. No acute superimposed confluent airspace  disease   1. No acute osseous abnormality  2. Arthritis at the radiocarpal joint and first [facility] joint  3. Slightly widened scapholunate interval suggesting ligamentous  disease  4. Chondrocalcinosis   I agree with the radiologist interpretation   Cardiac Monitoring: / EKG:  The patient was maintained on a cardiac monitor.  I personally viewed and interpreted the cardiac monitored which showed an underlying rhythm of: Sinus rhythm    Problem List / ED Course / Critical interventions / Medication management   I ordered medication including Percocet for pain, indomethacin for inflammation Reevaluation of the patient after these medicines showed that the patient improved I have reviewed the patients home medicines and have made adjustments as needed    Test / Admission - Considered:  The patient's presentation is consistent with a gout flare. No sign of septic arthritis at this time. No significant effusion. No fracture or dislocation on imaging. Patient was  unaware that upper extremities could have gout. He now states that his wrist feels similar to previous lower extremity gout flares. Patient to discharge home. Patient to take home medication for gout and follow up with primary team. Vitals are normal. Patient had visit with pulmonology yesterday. Oxygen requirement is at  baseline for patient.          Final Clinical Impression(s) / ED Diagnoses Final diagnoses:  Acute idiopathic gout of right wrist    Rx / DC Orders ED Discharge Orders     None         Pamala Duffel 10/27/21 1729    Charlynne Pander, MD 10/27/21 [phone]

## 2021-10-27 NOTE — Discharge Instructions (Addendum)
You were diagnosed today with a likely gout flare of the right wrist. Please take your home medication as directed by primary care.  Follow as needed with your primary care provider.

## 2021-10-27 NOTE — ED Triage Notes (Addendum)
C/o right hand swelling upon waking up today. Denies known injury. Pain with movement. Hx of gout in feet. Took allopurinol this morning.  Wears 3L O2 via [place] at baseline. Increased shortness of breath during triage

## 2021-12-10 ENCOUNTER — Encounter (HOSPITAL_COMMUNITY): Payer: Self-pay | Admitting: *Deleted

## 2021-12-10 ENCOUNTER — Emergency Department (HOSPITAL_COMMUNITY)
Admission: EM | Admit: 2021-12-10 | Discharge: 2021-12-10 | Disposition: A | Discharge status: Home / Self Care | Payer: Medicare Other | Attending: Emergency Medicine | Admitting: Emergency Medicine

## 2021-12-10 ENCOUNTER — Other Ambulatory Visit: Payer: Self-pay

## 2021-12-10 ENCOUNTER — Emergency Department (HOSPITAL_COMMUNITY): Payer: Medicare Other

## 2021-12-10 DIAGNOSIS — Z79899 Other long term (current) drug therapy: Secondary | ICD-10-CM | POA: Diagnosis not present

## 2021-12-10 DIAGNOSIS — M25531 Pain in right wrist: Secondary | ICD-10-CM | POA: Diagnosis not present

## 2021-12-10 DIAGNOSIS — I10 Essential (primary) hypertension: Secondary | ICD-10-CM | POA: Insufficient documentation

## 2021-12-10 DIAGNOSIS — W010XXA Fall on same level from slipping, tripping and stumbling without subsequent striking against object, initial encounter: Secondary | ICD-10-CM | POA: Insufficient documentation

## 2021-12-10 DIAGNOSIS — M79641 Pain in right hand: Secondary | ICD-10-CM

## 2021-12-10 DIAGNOSIS — E119 Type 2 diabetes mellitus without complications: Secondary | ICD-10-CM | POA: Insufficient documentation

## 2021-12-10 DIAGNOSIS — Y92007 Garden or yard of unspecified non-institutional (private) residence as the place of occurrence of the external cause: Secondary | ICD-10-CM | POA: Diagnosis not present

## 2021-12-10 MED ORDER — PREDNISONE 10 MG PO TABS: 40.0000 mg | ORAL_TABLET | Freq: Every day | ORAL | 0 refills | Status: AC

## 2021-12-10 MED ORDER — PREDNISONE 50 MG PO TABS
60.0000 mg | ORAL_TABLET | Freq: Once | ORAL | Status: AC
  Administered 2021-12-10: 60 mg via ORAL
  Filled 2021-12-10: qty 1

## 2021-12-10 NOTE — ED Triage Notes (Signed)
Pt with right hand pain x 4 weeks. Pt states he fell 4 weeks ago after tripped over his dog, not sure if this is an injury due to the fall.

## 2021-12-10 NOTE — ED Provider Notes (Signed)
[facility] EMERGENCY DEPARTMENT Provider Note   CSN: [id number] Arrival date & time: 12/10/21  1046     History  Chief Complaint  Patient presents with   Hand Pain    Max [name] is a 71 y.o. male.  With a history of gout, chronic pain requiring chronic opioid therapy, lumbar radiculopathy, anxiety, depression, hypertension who presents ED for evaluation of right hand and wrist pain.  He states his pain has been present for the past 3 weeks.  States he was walking in his backyard and tripped over a railroad ties and landed on his outstretched right hand.  States he did not have significant pain at that time, but believes this may be the cause of his pain.  Patient also reports he has been assessed and told he has gout as a source of his pain.  He states he was tried on "gout medicine" initially, but this did not change his pain.  He followed up with his primary care provider started him on a Medrol Dosepak.  He states this helped his pain, but once a steroid taper was over the pain came back.  He does take scheduled oxycodone for chronic pain and states that this has not been touching his pain level.  He states he used to take his allopurinol intermittently, but since symptom onset has been taking it daily.  Describes the pain as a burning sensation.  Also has decreased range of motion due to pain.  States he has been unable to sleep because of this.  Denies fevers, chills, numbness, tingling.   Hand Pain       Home Medications Prior to Admission medications   Medication Sig Start Date End Date Taking? Authorizing Provider  albuterol (PROVENTIL HFA;VENTOLIN HFA) 108 (90 BASE) MCG/ACT inhaler Inhale 2 puffs into the lungs every 4 (four) hours as needed for wheezing or shortness of breath. 09/20/13   Eber Hong, MD  albuterol (PROVENTIL) (2.5 MG/3ML) 0.083% nebulizer solution Take 2.5 mg by nebulization every 6 (six) hours as needed for wheezing or shortness of breath.    [provider]  allopurinol (ZYLOPRIM) 100 MG tablet Take 100 mg by mouth daily.     [provider]  b complex vitamins tablet Take 1 tablet by mouth daily.    [provider]  busPIRone (BUSPAR) 5 MG tablet Take 5 mg by mouth 3 (three) times daily.    [provider]  carisoprodol (SOMA) 350 MG tablet Take 350 mg by mouth 3 (three) times daily as needed for muscle spasms.    [provider]  diazepam (VALIUM) 5 MG tablet Take 0.5 tablets (2.5 mg total) by mouth every 6 (six) hours as needed for anxiety (spasms). 10/10/21   Mesner, Barbara Cower, MD  escitalopram (LEXAPRO) 20 MG tablet Take 20 mg by mouth daily.     [provider]  gabapentin (NEURONTIN) 600 MG tablet Take 1,800 mg by mouth 3 (three) times daily.  04/07/15   [provider]  HYDROmorphone (DILAUDID) 2 MG tablet Take 1 tablet (2 mg total) by mouth every 4 (four) hours as needed for severe pain. 10/05/16   Ocie Doyne, DMD  ibuprofen (ADVIL,MOTRIN) 800 MG tablet Take 1 tablet (800 mg total) by mouth every 8 (eight) hours as needed. 10/05/16   Ocie Doyne, DMD  lisinopril-hydrochlorothiazide (PRINZIDE,ZESTORETIC) 10-12.5 MG tablet Take 1 tablet by mouth daily.    [provider]  loperamide (IMODIUM A-D) 2 MG tablet Take 4 mg by  mouth 3 (three) times daily as needed for diarrhea or loose stools.    [provider]  Melatonin 5 MG TABS Take 10 mg by mouth at bedtime as needed (sleep).     [provider]  Multiple Vitamin (MULTIVITAMIN WITH MINERALS) TABS tablet Take 1 tablet by mouth daily.    [provider]  Oxycodone HCl 10 MG TABS Take 10 mg by mouth every 6 (six) hours as needed for pain. 09/26/16   [provider]  oxyCODONE-acetaminophen (PERCOCET) 7.5-325 MG tablet Take 1 tablet by mouth every 6 (six) hours as needed for moderate pain or severe pain (Must last 14 days.  Do not take and drive a car or operate machinery). Patient not taking:  Reported on 10/03/2016 07/16/16   Darreld Mclean, MD  OXYGEN Inhale into the lungs. Uses 2 liters at hs and prn during day    [provider]  pantoprazole (PROTONIX) 40 MG tablet Take 1 tablet (40 mg total) by mouth daily at 12 noon. Patient taking differently: Take 40 mg by mouth daily.  11/08/13   Parrett, Virgel Bouquet, NP  sucralfate (CARAFATE) 1 g tablet Take 1 g by mouth 3 (three) times daily.    [provider]  zolpidem (AMBIEN) 10 MG tablet Take 1 tablet (10 mg total) by mouth at bedtime as needed for sleep. Patient taking differently: Take 10 mg by mouth at bedtime.  11/08/13   Parrett, Virgel Bouquet, NP      Allergies    Heparin, Flexeril [cyclobenzaprine hcl], Propofol, and Other    Review of Systems   Review of Systems  Musculoskeletal:  Positive for arthralgias and joint swelling.  All other systems reviewed and are negative.   Physical Exam Updated Vital Signs BP 116/86 (BP Location: Left Arm)   Pulse 96   Temp 98.2 F (36.8 C) (Oral)   Resp 19   Ht 5\' 9"  (1.753 m)   Wt 102.5 kg   SpO2 94%   BMI 33.37 kg/m  Physical Exam Vitals and nursing note reviewed.  Constitutional:      General: He is not in acute distress.    Appearance: Normal appearance. He is normal weight. He is not ill-appearing.  HENT:     Head: Normocephalic and atraumatic.  Pulmonary:     Effort: Pulmonary effort is normal. No respiratory distress.  Abdominal:     General: Abdomen is flat.  Musculoskeletal:        General: Swelling and tenderness (right hand) present. Normal range of motion.     Cervical back: Neck supple.     Comments: Swelling of the right wrist and dorsum of the right hand when compared contralaterally.  No erythema.  Range of motion decreased secondary to pain.  Cap refill normal.  Radial pulse normal.  Sensation intact.  Skin:    General: Skin is warm and dry.     Capillary Refill: Capillary refill takes less than 2 seconds.  Neurological:     General: No focal  deficit present.     Mental Status: He is alert and oriented to person, place, and time.  Psychiatric:        Mood and Affect: Mood normal.        Behavior: Behavior normal.     ED Results / Procedures / Treatments   Labs (all labs ordered are listed, but only abnormal results are displayed) Labs Reviewed - No data to display  EKG None  Radiology DG Wrist Complete Right  Result Date: 12/10/2021 CLINICAL DATA:  Fall 4 weeks ago. Persistent right hand and wrist pain. EXAM: RIGHT WRIST - COMPLETE 3+ VIEW COMPARISON:  Right wrist radiographs, 10/27/2021. FINDINGS: No fracture.  No bone lesion. Scapholunate interval widened to 5 mm, stable from the prior exam. Scattered calcification along the triangular fibrocartilage complex. Significant narrowing of the radial scaphoid joint. Remaining wrist joints are normally spaced and aligned. Mild soft tissue swelling. IMPRESSION: 1. No fracture or acute skeletal abnormality. 2. 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 3. Arthropathic/degenerative changes with significant narrowing of the radial scaphoid joint. Nonspecific diffuse surrounding soft tissue swelling. Electronically Signed   By: Amie Portland M.D.   On: 12/10/2021 11:44   DG Hand Complete Right  Result Date: 12/10/2021 CLINICAL DATA:  Fall 4 weeks ago with persistent right hand pain. EXAM: RIGHT HAND - COMPLETE 3+ VIEW COMPARISON:  Right wrist radiographs, 10/27/2021. FINDINGS: No fracture.  No bone lesion. Widened scapholunate interval to 4 mm. Narrowed radial scaphoid joint. Mild osteoarthritic changes at the thumb IP joint. Soft tissues are unremarkable. IMPRESSION: 1. No fracture or acute finding.  No dislocation. Electronically Signed   By: Amie Portland M.D.   On: 12/10/2021 11:42    Procedures Procedures    Medications Ordered in ED Medications - No data to display  ED Course/ Medical Decision Making/ A&P Clinical Course  as of 12/10/21 1556  Sun Dec 10, 2021  1526 DG Hand Complete Right I personally reviewed the image.  Negative [AS]    Clinical Course User Index [AS] Div[place] Kyler, Edsel Petrin, PA-C                           Medical Decision Making Amount and/or Complexity of Data Reviewed Radiology: ordered.  This patient presents to the ED for concern of right hand and wrist pain, this involves an extensive number of treatment options, and is a complaint that carries with it a high risk of complications and morbidity.  The differential diagnosis includes gout, arthritis, septic arthritis, fracture, dislocation   Co morbidities that complicate the patient evaluation   history of gout, chronic pain, diabetes  My initial workup includes x-ray right hand and wrist  Additional history obtained from: Nursing notes from this visit. Previous records within EMR system ED visit on 10/27/2021 for evaluation of right hand pain thought to be ago.  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  This been an ongoing issue.  Patient was initially seen at [facility] ED on 10/27/2021 and was diagnosed with gout of the hand.  Indomethacin was not beneficial.  Patient followed up with his primary care provider who started him on a Medrol Dosepak.  Patient reports this did significantly help the pain, however it did rebound and is now worse than it was before.  Patient states he may have fallen, but does not believe the pain to be secondary to trauma.  X-rays negative.  Physical exam remarkable for swelling of the dorsum of the right hand with no warmth or erythema. I have low suspicion for infectious etiology.  We  will treat this like a gout flare with a 7-day prednisone burst  and instruct patient to follow-up closely with his primary care provider.  Gave patient strict return precautions.  Stable at discharge.  At this time there does not appear to be any evidence of an acute emergency medical condition and the patient appears stable for discharge with appropriate outpatient follow up. Diagnosis was discussed with patient who verbalizes understanding of care plan and is agreeable to discharge. I have discussed return precautions with patient who verbalizes understanding. Patient encouraged to follow-up with their PCP within 1 week. All questions answered.  Patient's case discussed with Dr. Estell HarpinZammit who agrees with plan to discharge with follow-up.   Note: Portions of this report may have been transcribed using voice recognition software. Every effort was made to ensure accuracy; however, inadvertent computerized transcription errors may still be present.          Final Clinical Impression(s) / ED Diagnoses Final diagnoses:  None    Rx / DC Orders ED Discharge Orders     None         Michelle PiperSchutt, Gerald Honea M, PA-C 12/10/21 1639    Loetta RoughNaasz, Hayley N, MD 12/10/21 [phone]

## 2021-12-10 NOTE — Discharge Instructions (Addendum)
You have been seen today for your complaint of right hand pain.   Your imaging was reassuring and showed no abnormalities. Your discharge medications includePrednisone.  You should take it as prescribed.  You should take it in the morning with food.  He should take the entire duration of the prescription. Follow up with: Your primary care this week Please seek immediate medical care if you develop any of the following symptoms: Your hand becomes warm, red, or swollen. Your hand is numb or tingling. Your hand is extremely swollen or deformed. Your hand or fingers turn white or blue. You cannot move your hand, wrist, or fingers. At this time there does not appear to be the presence of an emergent medical condition, however there is always the potential for conditions to change. Please read and follow the below instructions.  Do not take your medicine if  develop an itchy rash, swelling in your mouth or lips, or difficulty breathing; call 911 and seek immediate emergency medical attention if this occurs.  You may review your lab tests and imaging results in their entirety on your MyChart account.  Please discuss all results of fully with your primary care provider and other specialist at your follow-up visit.  Note: Portions of this text may have been transcribed using voice recognition software. Every effort was made to ensure accuracy; however, inadvertent computerized transcription errors may still be present.

## 2022-02-19 ENCOUNTER — Other Ambulatory Visit: Payer: Self-pay | Admitting: Orthopedic Surgery

## 2022-03-13 ENCOUNTER — Encounter (HOSPITAL_COMMUNITY): Payer: Self-pay

## 2022-03-13 NOTE — Pre-Procedure Instructions (Signed)
Surgical Instructions    Your procedure is scheduled on March 22, 2022.  Report to [facility] Main Entrance "A" at 07:30 A.M., then check in with the Admitting office.  Call this number if you have problems the morning of surgery:  [phone]   If you have any questions prior to your surgery date call [phone]: Open Monday-Friday 8am-4pm If you experience any cold or flu symptoms such as cough, fever, chills, shortness of breath, etc. between now and your scheduled surgery, please notify us at the above number     Remember:  Do not eat after midnight the night before your surgery  You may drink clear liquids until 06:30am the morning of your surgery.   Clear liquids allowed are: Water, Non-Citrus Juices (without pulp), Carbonated Beverages, Clear Tea, Black Coffee ONLY (NO MILK, CREAM OR POWDERED CREAMER of any kind), and Gatorade   Enhanced Recovery after Surgery for Orthopedics Enhanced Recovery after Surgery is a protocol used to improve the stress on your body and your recovery after surgery.  Patient Instructions   The day of surgery (if you have diabetes):  Drink ONE small 10 oz bottle of water/G2 Gatorade by 06:30am the morning of surgery This bottle was given to you during your hospital  pre-op appointment visit.  Nothing else to drink after completing the  Small 10 oz bottle of water/G2 Gatorade.         If you have questions, please contact your surgeon's office.     Take these medicines the morning of surgery with A SIP OF WATER:  Allopurinol (Zyloprim) Diazepam (Valium) Escitalopram (Lexapro) Fluticasone-salmeterol (Advair) Gabapentin (Neurontin) Pantoprazole (Protonix) Prednisone (Deltasone) Rosuvastatin (Crestor) Varenicline (Chantix)  If needed: Albuterol (Proventil HFA) inhaler Loperamide (Imodium) Oxycodone (Roxicodone)  Please ask your Doctor regarding when to STOP/CONTINUE your Buprenorphine (SUBUTEX).  As of today, STOP taking any  Aspirin (unless otherwise instructed by your surgeon) Aleve, Naproxen, Ibuprofen, Motrin, Advil, Goody's, BC's, all herbal medications, fish oil, and all vitamins.  WHAT DO I DO ABOUT MY DIABETES MEDICATION?   HOLD YOUR FARXIGA 72 HOURS PRIOR TO SURGERY.  HOLD YOUR RYBELSUS 24 HOURS PRIOR TO SURGERY.   Do not take oral diabetes medicines (pills) the morning of surgery.    HOW TO MANAGE YOUR DIABETES BEFORE AND AFTER SURGERY  Why is it important to control my blood sugar before and after surgery? Improving blood sugar levels before and after surgery helps healing and can limit problems. A way of improving blood sugar control is eating a healthy diet by:  Eating less sugar and carbohydrates  Increasing activity/exercise  Talking with your doctor about reaching your blood sugar goals High blood sugars (greater than 180 mg/dL) can raise your risk of infections and slow your recovery, so you will need to focus on controlling your diabetes during the weeks before surgery. Make sure that the doctor who takes care of your diabetes knows about your planned surgery including the date and location.  How do I manage my blood sugar before surgery? Check your blood sugar at least 4 times a day, starting 2 days before surgery, to make sure that the level is not too high or low.  Check your blood sugar the morning of your surgery when you wake up and every 2 hours until you get to the Short Stay unit.  If your blood sugar is less than 70 mg/dL, you will need to treat for low blood sugar: Do not take insulin. Treat a low blood sugar (less  than 70 mg/dL) with  cup of clear juice (cranberry or apple), 4 glucose tablets, OR glucose gel. Recheck blood sugar in 15 minutes after treatment (to make sure it is greater than 70 mg/dL). If your blood sugar is not greater than 70 mg/dL on recheck, call [phone] for further instructions. Report your blood sugar to the short stay nurse when you get to Short  Stay.  If you are admitted to the hospital after surgery: Your blood sugar will be checked by the staff and you will probably be given insulin after surgery (instead of oral diabetes medicines) to make sure you have good blood sugar levels. The goal for blood sugar control after surgery is 80-180 mg/dL.            Do not wear jewelry. Do not wear lotions, powders, cologne or deodorant. Do not shave 48 hours prior to surgery.  Men may shave face and neck. Do not bring valuables to the hospital.   [facility] is not responsible for any belongings or valuables.    Do NOT Smoke (Tobacco/Vaping)  24 hours prior to your procedure  If you use a CPAP at night, you may bring your mask for your overnight stay.   Contacts, glasses, hearing aids, dentures or partials may not be worn into surgery, please bring cases for these belongings   For patients admitted to the hospital, discharge time will be determined by your treatment team.   Patients discharged the day of surgery will not be allowed to drive home, and someone needs to stay with them for 24 hours.   SURGICAL WAITING ROOM VISITATION Patients having surgery or a procedure may have no more than 2 support people in the waiting area - these visitors may rotate.   Children under the age of 12 must have an adult with them who is not the patient. If the patient needs to stay at the hospital during part of their recovery, the visitor guidelines for inpatient rooms apply. Pre-op nurse will coordinate an appropriate time for 1 support person to accompany patient in pre-op.  This support person may not rotate.   Please refer to [email or web address] for the visitor guidelines for Inpatients (after your surgery is over and you are in a regular room).    Special instructions:    Oral Hygiene is also important to reduce your risk of infection.  Remember - BRUSH YOUR TEETH THE MORNING OF SURGERY WITH  YOUR REGULAR TOOTHPASTE   [place]- Preparing For Surgery  Before surgery, you can play an important role. Because skin is not sterile, your skin needs to be as free of germs as possible. You can reduce the number of germs on your skin by washing with CHG (chlorahexidine gluconate) Soap before surgery.  CHG is an antiseptic cleaner which kills germs and bonds with the skin to continue killing germs even after washing.     Please do not use if you have an allergy to CHG or antibacterial soaps. If your skin becomes reddened/irritated stop using the CHG.  Do not shave (including legs and underarms) for at least 48 hours prior to first CHG shower. It is OK to shave your face.  Please follow these instructions carefully.     Shower the NIGHT BEFORE SURGERY and the MORNING OF SURGERY with CHG Soap.   If you chose to wash your hair, wash your hair first as usual with your normal shampoo. After you shampoo, rinse your hair and body thoroughly to remove the  shampoo.  Then [employer] and genitals (private parts) with your normal soap and rinse thoroughly to remove soap.  After that Use CHG Soap as you would any other liquid soap. You can apply CHG directly to the skin and wash gently with a scrungie or a clean washcloth.   Apply the CHG Soap to your body ONLY FROM THE NECK DOWN.  Do not use on open wounds or open sores. Avoid contact with your eyes, ears, mouth and genitals (private parts). Wash Face and genitals (private parts)  with your normal soap.   Wash thoroughly, paying special attention to the area where your surgery will be performed.  Thoroughly rinse your body with warm water from the neck down.  DO NOT shower/wash with your normal soap after using and rinsing off the CHG Soap.  Pat yourself dry with a CLEAN TOWEL.  Wear CLEAN PAJAMAS to bed the night before surgery  Place CLEAN SHEETS on your bed the night before your surgery  DO NOT SLEEP WITH PETS.   Day of Surgery:  Take  a shower with CHG soap. Wear Clean/Comfortable clothing the morning of surgery Do not apply any deodorants/lotions.   Remember to brush your teeth WITH YOUR REGULAR TOOTHPASTE.    If you received a COVID test during your pre-op visit, it is requested that you wear a mask when out in public, stay away from anyone that may not be feeling well, and notify your surgeon if you develop symptoms. If you have been in contact with anyone that has tested positive in the last 10 days, please notify your surgeon.    Please read over the following fact sheets that you were given.

## 2022-03-14 ENCOUNTER — Other Ambulatory Visit: Payer: Self-pay

## 2022-03-14 ENCOUNTER — Encounter (HOSPITAL_COMMUNITY)
Admission: RE | Admit: 2022-03-14 | Discharge: 2022-03-14 | Disposition: A | Discharge status: Home / Self Care | Payer: 59 | Source: Ambulatory Visit | Attending: Orthopedic Surgery | Admitting: Orthopedic Surgery

## 2022-03-14 ENCOUNTER — Encounter (HOSPITAL_COMMUNITY): Payer: Self-pay

## 2022-03-14 DIAGNOSIS — F419 Anxiety disorder, unspecified: Secondary | ICD-10-CM | POA: Insufficient documentation

## 2022-03-14 DIAGNOSIS — Z7952 Long term (current) use of systemic steroids: Secondary | ICD-10-CM | POA: Diagnosis not present

## 2022-03-14 DIAGNOSIS — G8929 Other chronic pain: Secondary | ICD-10-CM | POA: Diagnosis not present

## 2022-03-14 DIAGNOSIS — F17210 Nicotine dependence, cigarettes, uncomplicated: Secondary | ICD-10-CM | POA: Insufficient documentation

## 2022-03-14 DIAGNOSIS — M19031 Primary osteoarthritis, right wrist: Secondary | ICD-10-CM | POA: Diagnosis not present

## 2022-03-14 DIAGNOSIS — E118 Type 2 diabetes mellitus with unspecified complications: Secondary | ICD-10-CM | POA: Diagnosis not present

## 2022-03-14 DIAGNOSIS — G4733 Obstructive sleep apnea (adult) (pediatric): Secondary | ICD-10-CM | POA: Diagnosis not present

## 2022-03-14 DIAGNOSIS — I10 Essential (primary) hypertension: Secondary | ICD-10-CM | POA: Insufficient documentation

## 2022-03-14 DIAGNOSIS — Z01818 Encounter for other preprocedural examination: Secondary | ICD-10-CM

## 2022-03-14 DIAGNOSIS — Z8619 Personal history of other infectious and parasitic diseases: Secondary | ICD-10-CM | POA: Diagnosis not present

## 2022-03-14 DIAGNOSIS — Z79899 Other long term (current) drug therapy: Secondary | ICD-10-CM | POA: Diagnosis not present

## 2022-03-14 DIAGNOSIS — J449 Chronic obstructive pulmonary disease, unspecified: Secondary | ICD-10-CM | POA: Insufficient documentation

## 2022-03-14 DIAGNOSIS — Z01812 Encounter for preprocedural laboratory examination: Secondary | ICD-10-CM | POA: Insufficient documentation

## 2022-03-14 DIAGNOSIS — E872 Acidosis, unspecified: Secondary | ICD-10-CM

## 2022-03-14 HISTORY — DX: Type 2 diabetes mellitus without complications: E11.9

## 2022-03-14 LAB — COMPREHENSIVE METABOLIC PANEL
ALT: 13 U/L (ref 0–44)
AST: 18 U/L (ref 15–41)
Albumin: 3.4 g/dL — ABNORMAL LOW (ref 3.5–5.0)
Alkaline Phosphatase: 88 U/L (ref 38–126)
Anion gap: 10 (ref 5–15)
BUN: 10 mg/dL (ref 8–23)
CO2: 28 mmol/L (ref 22–32)
Calcium: 9.1 mg/dL (ref 8.9–10.3)
Chloride: 98 mmol/L (ref 98–111)
Creatinine, Ser: 0.92 mg/dL (ref 0.61–1.24)
GFR, Estimated: >60 mL/min (ref >60)
Glucose, Bld: 119 mg/dL — ABNORMAL HIGH (ref 70–99)
Potassium: 4.2 mmol/L (ref 3.5–5.1)
Sodium: 136 mmol/L (ref 135–145)
Total Bilirubin: 0.3 mg/dL (ref 0.3–1.2)
Total Protein: 7.3 g/dL (ref 6.5–8.1)

## 2022-03-14 LAB — CBC
HCT: 45.4 % (ref 39.0–52.0)
Hemoglobin: 14.3 g/dL (ref 13.0–17.0)
MCH: 29.5 pg (ref 26.0–34.0)
MCHC: 31.5 g/dL (ref 30.0–36.0)
MCV: 93.6 fL (ref 80.0–100.0)
Platelets: 295 10*3/uL (ref 150–400)
RBC: 4.85 MIL/uL (ref 4.22–5.81)
RDW: 15.5 % (ref 11.5–15.5)
WBC: 11.2 10*3/uL — ABNORMAL HIGH (ref 4.0–10.5)
nRBC: 0 % (ref 0.0–0.2)

## 2022-03-14 LAB — SURGICAL PCR SCREEN
MRSA, PCR: NEGATIVE
Staphylococcus aureus: NEGATIVE

## 2022-03-14 LAB — HEMOGLOBIN A1C
Hgb A1c MFr Bld: 6.1 % — ABNORMAL HIGH (ref 4.8–5.6)
Mean Plasma Glucose: 128.37 mg/dL

## 2022-03-14 LAB — GLUCOSE, CAPILLARY: Glucose-Capillary: 153 mg/dL — ABNORMAL HIGH (ref 70–99)

## 2022-03-14 NOTE — Progress Notes (Signed)
PCP - Basilio Cairo FNP Cardiologist - denies Pulmonologist - Gwenevere Ghazi, MD  PPM/ICD - denies Device Orders -  Rep Notified -   Chest x-ray - 10/27/21 EKG - 10/27/21 Stress Test - denies ECHO - 10/21/13 Cardiac Cath - denies  Sleep Study - "20 years ago"  CPAP - denies  Fasting Blood Sugar - 120's  Checks Blood Sugar once a day  Last dose of GLP1 agonist- 03/14/22 GLP1 instructions: Hold 24 hours prior to surgery.   Blood Thinner Instructions:na Aspirin Instructions:na  ERAS Protcol -clear liquids until 0630 PRE-SURGERY Ensure or G2- G2  COVID TEST- na   Anesthesia review: home O2  Patient denies shortness of breath, fever, cough and chest pain at PAT appointment   All instructions explained to the patient, with a verbal understanding of the material. Patient agrees to go over the instructions while at home for a better understanding. Patient also instructed to wear a mask when out in public prior to surgery.. The opportunity to ask questions was provided.

## 2022-03-16 NOTE — Anesthesia Preprocedure Evaluation (Addendum)
Anesthesia Evaluation  Patient identified by MRN, date of birth, ID band Patient awake    Reviewed: Allergy & Precautions, NPO status , Patient's Chart, lab work & pertinent test results  History of Anesthesia Complications Negative for: history of anesthetic complications  Airway Mallampati: II  TM Distance: >3 FB Neck ROM: Full    Dental  (+) Dental Advisory Given   Pulmonary sleep apnea , COPD, Current Smoker and Patient abstained from smoking.   Pulmonary exam normal        Cardiovascular hypertension, Normal cardiovascular exam     Neuro/Psych  PSYCHIATRIC DISORDERS Anxiety Depression    negative neurological ROS     GI/Hepatic negative GI ROS, Neg liver ROS,,,  Endo/Other  diabetes    Renal/GU negative Renal ROS     Musculoskeletal negative musculoskeletal ROS (+)    Abdominal   Peds  Hematology negative hematology ROS (+)   Anesthesia Other Findings   Reproductive/Obstetrics                             Anesthesia Physical Anesthesia Plan  ASA: 3  Anesthesia Plan: Regional and MAC   Post-op Pain Management: Tylenol PO (pre-op)* and Minimal or no pain anticipated   Induction:   PONV Risk Score and Plan: 0 and Propofol infusion  Airway Management Planned: Natural Airway  Additional Equipment:   Intra-op Plan:   Post-operative Plan:   Informed Consent: I have reviewed the patients History and Physical, chart, labs and discussed the procedure including the risks, benefits and alternatives for the proposed anesthesia with the patient or authorized representative who has indicated his/her understanding and acceptance.     Dental advisory given  Plan Discussed with: Anesthesiologist and CRNA  Anesthesia Plan Comments: (PAT note written 03/16/2022 by Myra Gianotti, PA-C.  )       Anesthesia Quick Evaluation
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
## 2022-03-21 NOTE — Progress Notes (Signed)
voiced understanding of new arrival time of 0700, finish ERAS drink and clear liquids okay until 0600

## 2022-03-22 ENCOUNTER — Other Ambulatory Visit: Payer: Self-pay

## 2022-03-22 ENCOUNTER — Encounter (HOSPITAL_COMMUNITY)
Admission: RE | Disposition: A | Discharge status: Home / Self Care | Payer: Self-pay | Source: Home / Self Care | Attending: Orthopedic Surgery

## 2022-03-22 ENCOUNTER — Ambulatory Visit (HOSPITAL_COMMUNITY): Payer: 59 | Admitting: Physician Assistant

## 2022-03-22 ENCOUNTER — Ambulatory Visit (HOSPITAL_BASED_OUTPATIENT_CLINIC_OR_DEPARTMENT_OTHER): Payer: 59 | Admitting: Anesthesiology

## 2022-03-22 ENCOUNTER — Encounter (HOSPITAL_COMMUNITY): Payer: Self-pay | Admitting: Orthopedic Surgery

## 2022-03-22 ENCOUNTER — Ambulatory Visit (HOSPITAL_COMMUNITY): Payer: 59

## 2022-03-22 ENCOUNTER — Ambulatory Visit (HOSPITAL_COMMUNITY)
Admission: RE | Admit: 2022-03-22 | Discharge: 2022-03-22 | Disposition: A | Discharge status: Home / Self Care | Payer: 59 | Attending: Orthopedic Surgery | Admitting: Orthopedic Surgery

## 2022-03-22 DIAGNOSIS — F17210 Nicotine dependence, cigarettes, uncomplicated: Secondary | ICD-10-CM

## 2022-03-22 DIAGNOSIS — M25831 Other specified joint disorders, right wrist: Secondary | ICD-10-CM | POA: Insufficient documentation

## 2022-03-22 DIAGNOSIS — E119 Type 2 diabetes mellitus without complications: Secondary | ICD-10-CM | POA: Insufficient documentation

## 2022-03-22 DIAGNOSIS — M19031 Primary osteoarthritis, right wrist: Secondary | ICD-10-CM

## 2022-03-22 DIAGNOSIS — F32A Depression, unspecified: Secondary | ICD-10-CM | POA: Diagnosis not present

## 2022-03-22 DIAGNOSIS — F419 Anxiety disorder, unspecified: Secondary | ICD-10-CM | POA: Insufficient documentation

## 2022-03-22 DIAGNOSIS — I10 Essential (primary) hypertension: Secondary | ICD-10-CM | POA: Diagnosis not present

## 2022-03-22 DIAGNOSIS — Z01818 Encounter for other preprocedural examination: Secondary | ICD-10-CM

## 2022-03-22 DIAGNOSIS — G4730 Sleep apnea, unspecified: Secondary | ICD-10-CM | POA: Insufficient documentation

## 2022-03-22 DIAGNOSIS — J449 Chronic obstructive pulmonary disease, unspecified: Secondary | ICD-10-CM | POA: Insufficient documentation

## 2022-03-22 HISTORY — PX: CARPECTOMY WITH RADIAL STYLOIDECTOMY: SHX5609

## 2022-03-22 HISTORY — PX: NERVE REPAIR: SHX2083

## 2022-03-22 LAB — GLUCOSE, CAPILLARY
Glucose-Capillary: 98 mg/dL (ref 70–99)
Glucose-Capillary: 112 mg/dL — ABNORMAL HIGH (ref 70–99)
Glucose-Capillary: 97 mg/dL (ref 70–99)

## 2022-03-22 SURGERY — CARPECTOMY WITH RADIAL STYLOIDECTOMY
Anesthesia: Monitor Anesthesia Care | Site: Wrist | Laterality: Right

## 2022-03-22 MED ORDER — HYDROMORPHONE HCL 2 MG PO TABS: 2.0000 mg | ORAL_TABLET | Freq: Four times a day (QID) | ORAL | 0 refills | Status: AC | PRN

## 2022-03-22 MED ORDER — MIDAZOLAM HCL 2 MG/2ML IJ SOLN
INTRAMUSCULAR | Status: DC | PRN
  Administered 2022-03-22: 2 mg via INTRAVENOUS

## 2022-03-22 MED ORDER — MIDAZOLAM HCL 2 MG/2ML IJ SOLN: 2.0000 mg | Freq: Once | INTRAMUSCULAR | Status: AC

## 2022-03-22 MED ORDER — PROPOFOL 10 MG/ML IV BOLUS
INTRAVENOUS | Status: AC
  Filled 2022-03-22: qty 20

## 2022-03-22 MED ORDER — CHLORHEXIDINE GLUCONATE 0.12 % MT SOLN
15.0000 mL | Freq: Once | OROMUCOSAL | Status: AC
  Administered 2022-03-22: 15 mL via OROMUCOSAL
  Filled 2022-03-22: qty 15

## 2022-03-22 MED ORDER — BUPIVACAINE HCL (PF) 0.5 % IJ SOLN
INTRAMUSCULAR | Status: DC | PRN
  Administered 2022-03-22: 15 mL via PERINEURAL

## 2022-03-22 MED ORDER — LIDOCAINE 2% (20 MG/ML) 5 ML SYRINGE
INTRAMUSCULAR | Status: AC
  Filled 2022-03-22: qty 5

## 2022-03-22 MED ORDER — LIDOCAINE 2% (20 MG/ML) 5 ML SYRINGE
INTRAMUSCULAR | Status: DC | PRN
  Administered 2022-03-22: 60 mg via INTRAVENOUS

## 2022-03-22 MED ORDER — DEXAMETHASONE SODIUM PHOSPHATE 10 MG/ML IJ SOLN
INTRAMUSCULAR | Status: DC | PRN
  Administered 2022-03-22: 5 mg

## 2022-03-22 MED ORDER — ORAL CARE MOUTH RINSE: 15.0000 mL | Freq: Once | OROMUCOSAL | Status: AC

## 2022-03-22 MED ORDER — FENTANYL CITRATE (PF) 100 MCG/2ML IJ SOLN: 100.0000 ug | Freq: Once | INTRAMUSCULAR | Status: AC

## 2022-03-22 MED ORDER — FENTANYL CITRATE (PF) 100 MCG/2ML IJ SOLN
INTRAMUSCULAR | Status: AC
  Administered 2022-03-22: 100 ug via INTRAVENOUS
  Filled 2022-03-22: qty 2

## 2022-03-22 MED ORDER — PROPOFOL 500 MG/50ML IV EMUL
INTRAVENOUS | Status: DC | PRN
  Administered 2022-03-22: 75 ug/kg/min via INTRAVENOUS

## 2022-03-22 MED ORDER — LACTATED RINGERS IV SOLN: INTRAVENOUS | Status: DC

## 2022-03-22 MED ORDER — EPHEDRINE 5 MG/ML INJ
INTRAVENOUS | Status: AC
  Filled 2022-03-22: qty 5

## 2022-03-22 MED ORDER — EPHEDRINE SULFATE-NACL 50-0.9 MG/10ML-% IV SOSY
PREFILLED_SYRINGE | INTRAVENOUS | Status: DC | PRN
  Administered 2022-03-22 (×2): 5 mg via INTRAVENOUS

## 2022-03-22 MED ORDER — CEFAZOLIN SODIUM-DEXTROSE 2-4 GM/100ML-% IV SOLN
2.0000 g | INTRAVENOUS | Status: AC
  Administered 2022-03-22: 2 g via INTRAVENOUS
  Filled 2022-03-22: qty 100

## 2022-03-22 MED ORDER — MIDAZOLAM HCL 2 MG/2ML IJ SOLN
INTRAMUSCULAR | Status: AC
  Administered 2022-03-22: 2 mg via INTRAVENOUS
  Filled 2022-03-22: qty 2

## 2022-03-22 MED ORDER — INSULIN ASPART 100 UNIT/ML IJ SOLN: 0.0000 [IU] | INTRAMUSCULAR | Status: DC | PRN

## 2022-03-22 MED ORDER — PHENYLEPHRINE 80 MCG/ML (10ML) SYRINGE FOR IV PUSH (FOR BLOOD PRESSURE SUPPORT)
PREFILLED_SYRINGE | INTRAVENOUS | Status: AC
  Filled 2022-03-22: qty 10

## 2022-03-22 MED ORDER — PROPOFOL 10 MG/ML IV BOLUS
INTRAVENOUS | Status: DC | PRN
  Administered 2022-03-22: 20 mg via INTRAVENOUS

## 2022-03-22 MED ORDER — PHENYLEPHRINE 80 MCG/ML (10ML) SYRINGE FOR IV PUSH (FOR BLOOD PRESSURE SUPPORT)
PREFILLED_SYRINGE | INTRAVENOUS | Status: DC | PRN
  Administered 2022-03-22 (×5): 160 ug via INTRAVENOUS

## 2022-03-22 MED ORDER — ACETAMINOPHEN 500 MG PO TABS
1000.0000 mg | ORAL_TABLET | Freq: Once | ORAL | Status: AC
  Administered 2022-03-22: 1000 mg via ORAL
  Filled 2022-03-22: qty 2

## 2022-03-22 MED ORDER — SODIUM CHLORIDE 0.9 % IR SOLN
Status: DC | PRN
  Administered 2022-03-22: 1000 mL

## 2022-03-22 MED ORDER — DEXMEDETOMIDINE HCL IN NACL 80 MCG/20ML IV SOLN
INTRAVENOUS | Status: DC | PRN
  Administered 2022-03-22 (×2): 8 ug via BUCCAL

## 2022-03-22 MED ORDER — MIDAZOLAM HCL 2 MG/2ML IJ SOLN
INTRAMUSCULAR | Status: AC
  Filled 2022-03-22: qty 2

## 2022-03-22 MED ORDER — ONDANSETRON HCL 4 MG/2ML IJ SOLN
INTRAMUSCULAR | Status: DC | PRN
  Administered 2022-03-22: 4 mg via INTRAVENOUS

## 2022-03-22 SURGICAL SUPPLY — 37 items
BAG COUNTER SPONGE SURGICOUNT (BAG) ×1 IMPLANT
BLADE MINI RND TIP GREEN BEAV (BLADE) IMPLANT
BNDG COHESIVE 2X5 TAN STRL LF (GAUZE/BANDAGES/DRESSINGS) IMPLANT
BNDG ELASTIC 3X5.8 VLCR STR LF (GAUZE/BANDAGES/DRESSINGS) IMPLANT
BNDG ELASTIC 4X5.8 VLCR STR LF (GAUZE/BANDAGES/DRESSINGS) ×1 IMPLANT
BNDG ESMARK 4X9 LF (GAUZE/BANDAGES/DRESSINGS) ×1 IMPLANT
BNDG GAUZE DERMACEA FLUFF 4 (GAUZE/BANDAGES/DRESSINGS) ×1 IMPLANT
CORD BIPOLAR FORCEPS 12FT (ELECTRODE) ×1 IMPLANT
DRSG ADAPTIC 3X8 NADH LF (GAUZE/BANDAGES/DRESSINGS) ×1 IMPLANT
DRSG EMULSION OIL 3X3 NADH (GAUZE/BANDAGES/DRESSINGS) ×1 IMPLANT
GAUZE PAD ABD 8X10 STRL (GAUZE/BANDAGES/DRESSINGS) ×2 IMPLANT
GAUZE SPONGE 4X4 12PLY STRL (GAUZE/BANDAGES/DRESSINGS) ×1 IMPLANT
GAUZE XEROFORM 1X8 LF (GAUZE/BANDAGES/DRESSINGS) ×1 IMPLANT
GOWN STRL REUS W/ TWL LRG LVL3 (GOWN DISPOSABLE) ×1 IMPLANT
GOWN STRL REUS W/TWL LRG LVL3 (GOWN DISPOSABLE) ×1
HANDPIECE INTERPULSE COAX TIP (DISPOSABLE)
KIT BASIN OR (CUSTOM PROCEDURE TRAY) ×1 IMPLANT
KIT TURNOVER KIT B (KITS) ×1 IMPLANT
MANIFOLD NEPTUNE II (INSTRUMENTS) ×1 IMPLANT
NS IRRIG 1000ML POUR BTL (IV SOLUTION) ×1 IMPLANT
PACK ORTHO EXTREMITY (CUSTOM PROCEDURE TRAY) ×1 IMPLANT
PAD ARMBOARD 7.5X6 YLW CONV (MISCELLANEOUS) ×2 IMPLANT
PADDING CAST SYNTHETIC 2X4 NS (CAST SUPPLIES) IMPLANT
SET HNDPC FAN SPRY TIP SCT (DISPOSABLE) IMPLANT
SPLINT PLASTER CAST XFAST 5X30 (CAST SUPPLIES) IMPLANT
SPONGE T-LAP 18X18 ~~LOC~~+RFID (SPONGE) ×1 IMPLANT
SPONGE T-LAP 4X18 ~~LOC~~+RFID (SPONGE) ×1 IMPLANT
SUT ETHILON 4 0 PS 2 18 (SUTURE) IMPLANT
SUT VIC AB 2-0 CT2 27 (SUTURE) IMPLANT
SUT VIC AB 4-0 PS2 18 (SUTURE) IMPLANT
SWAB CULTURE ESWAB REG 1ML (MISCELLANEOUS) IMPLANT
TOWEL GREEN STERILE (TOWEL DISPOSABLE) ×1 IMPLANT
TOWEL GREEN STERILE FF (TOWEL DISPOSABLE) ×1 IMPLANT
TUBE CONNECTING 12X1/4 (SUCTIONS) ×1 IMPLANT
UNDERPAD 30X36 HEAVY ABSORB (UNDERPADS AND DIAPERS) ×1 IMPLANT
WATER STERILE IRR 1000ML POUR (IV SOLUTION) ×1 IMPLANT
YANKAUER SUCT BULB TIP NO VENT (SUCTIONS) ×1 IMPLANT

## 2022-03-22 NOTE — Transfer of Care (Signed)
Immediate Anesthesia Transfer of Care Note  Patient: Max [name]  Procedure(s) Performed: RIGHT WRIST PROXIMAL ROW CARPECTOMY, POSSIBLE RADIAL STYLOIDECTOMY (Right: Wrist) POSTERIOR INTEROSSEOUS NEURECTOMY (Right: Wrist)  Patient Location: PACU  Anesthesia Type:MAC and Regional  Level of Consciousness: awake, alert , and oriented  Airway & Oxygen Therapy: Patient Spontanous Breathing and Patient connected to nasal cannula oxygen  Post-op Assessment: Report given to RN and Post -op Vital signs reviewed and stable  Post vital signs: Reviewed and stable  Last Vitals:  Vitals Value Taken Time  BP 136/86 03/22/22 1047  Temp    Pulse 60 03/22/22 1051  Resp 12 03/22/22 1051  SpO2 94 % 03/22/22 1051  Vitals shown include unvalidated device data.  Last Pain:  Vitals:   03/22/22 0738  PainSc: 7          Complications: No notable events documented.

## 2022-03-22 NOTE — Anesthesia Procedure Notes (Signed)
Procedure Name: MAC Date/Time: 03/22/2022 9:26 AM  Performed by: Rande Brunt, CRNAPre-anesthesia Checklist: Patient identified, Emergency Drugs available, Suction available and Patient being monitored Patient Re-evaluated:Patient Re-evaluated prior to induction Oxygen Delivery Method: Simple face mask Preoxygenation: Pre-oxygenation with 100% oxygen Induction Type: IV induction Placement Confirmation: positive ETCO2 and CO2 detector

## 2022-03-22 NOTE — Op Note (Signed)
I assisted Surgeon(s) and Role:    * Leanora Cover, MD - Primary    * Daryll Brod, MD - Assisting on the Procedure(s): RIGHT WRIST PROXIMAL ROW CARPECTOMY, POSSIBLE RADIAL STYLOIDECTOMY POSTERIOR INTEROSSEOUS NEURECTOMY on 03/22/2022.  I provided assistance on this case as follows: Set up, approach, identification of the posterior osseous nerve with resection, identification of the scapholunate ligament dissociation, isolation and removal of the scaphoid, the lunate, and the triquetrum, resection of the distal radial styloid for impingement, closure of the wound and application of the dressing and splint.  Electronically signed by: Daryll Brod, MD Date: 03/22/2022 Time: 10:44 AM

## 2022-03-22 NOTE — Progress Notes (Signed)
Orthopedic Tech Progress Note Patient Details:  ROLLIN MESIDOR Jan 26, 1951 BB:[id number]  Ortho Devices Type of Ortho Device: Sling immobilizer Ortho Device/Splint Interventions: Ordered     Sling dropped off at bedside with PACU RN. Vernona Rieger 03/22/2022, 11:25 AM

## 2022-03-22 NOTE — Op Note (Addendum)
NAME: Max [name] MEDICAL RECORD NO: [id number] DATE OF BIRTH: July 06, 1950 FACILITY: [facility] LOCATION: [facility] OR PHYSICIAN: Tennis Must, MD   OPERATIVE REPORT   DATE OF PROCEDURE: 03/22/22    PREOPERATIVE DIAGNOSIS: Right wrist scapholunate advanced collapse   POSTOPERATIVE DIAGNOSIS: Right wrist scapholunate advanced collapse   PROCEDURE: 1.  Right wrist proximal row carpectomy 2.  Right radial styloidectomy 3.  Right posterior interosseous nerve neurectomy for management of preoperative pain   SURGEON:  Leanora Cover, M.D.   ASSISTANT: Daryll Brod, MD   ANESTHESIA:  Regional with sedation   INTRAVENOUS FLUIDS:  Per anesthesia flow sheet.   ESTIMATED BLOOD LOSS:  Minimal.   COMPLICATIONS:  None.   SPECIMENS:  none   TOURNIQUET TIME:   Right arm: 57 minutes at 250 mmHg   DISPOSITION:  Stable to PACU.   INDICATIONS: 72 year old male with right wrist scapholunate advanced collapse.  He has tried splinting and injections without lasting relief.  He wishes to proceed with proximal row carpectomy, radial styloidectomy, and posterior interosseous nerve neurectomy.  Risks, benefits and alternatives of surgery were discussed including the risks of blood loss, infection, damage to nerves, vessels, tendons, ligaments, bone for surgery, need for additional surgery, complications with wound healing, continued pain, stiffness, continued pain.  He voiced understanding of these risks and elected to proceed.  OPERATIVE COURSE:  After being identified preoperatively by myself,  the patient and I agreed on the procedure and site of the procedure.  The surgical site was marked.  Surgical consent had been signed. Preoperative IV antibiotic prophylaxis was given. He was transferred to the operating room and placed on the operating table in supine position with the right upper extremity on an arm board.  Sedation was induced by the anesthesiologist. A regional block had been performed by anesthesia  in preoperative holding.    Right upper extremity was prepped and draped in normal sterile orthopedic fashion.  A surgical pause was performed between the surgeons, anesthesia, and operating room staff and all were in agreement as to the patient, procedure, and site of procedure.  Tourniquet at the proximal aspect of the extremity was inflated to 250 mmHg after exsanguination of the arm with an Esmarch bandage.  Longitudinal incision was made at the dorsum of the wrist.  This was carried in subcutaneous tissues by spreading technique.  Bipolar electrocautery was used to obtain hemostasis.  The fourth dorsal compartment was opened and the EDC tendons were retracted.  The posterior interosseous nerve was identified and resected.  It was bipolared.  A centimeter of the nerve was removed.  This was done to help with the preoperative pain.  The capsule was sharply incised.  The lunate was identified.  The capsule was teed at the proximal aspect.  The EPL and second dorsal compartment tendons were identified and protected throughout the case.  The scaphoid was carefully freed up using the knife as well as freer elevator and curette.  The rongeurs were used to remove the scaphoid in a piecemeal fashion.  Once the scaphoid was removed attention was turned to the triquetrum.  Again the knife and freer elevator and curette were used to free up the soft tissue attachments and remove the triquetrum.  Complete removal was obtained.  The lunate was then removed by releasing all of the volar ligament sharply under direct visualization.  The capitate fell into the lunate facet nicely.  The radial styloid was isolated.  The rondure was used to perform a  radial styloidectomy to prevent impingement.  Care was taken to preserve the radioscaphocapitate ligament.  The wound was copiously irrigated with sterile saline.  The C-arm was used in AP and lateral projections to ensure appropriate position of the capitate and the lunate set.  The  capsule was then repaired using a 2-0 Vicryl suture in a running fashion.  The extensor retinaculum was repaired with the 2-0 Vicryl in a figure-of-eight fashion.  Inverted erupted 4-0 Vicryl sutures were placed in subcutaneous tissues and skin was closed with 4-0 nylon in a horizontal mattress fashion.  The wound was dressed with sterile Xeroform 4 x 4's and wrapped with a Kerlix bandage.  A volar and dorsal slab splint was placed and wrapped with Kerlix and Ace bandage.  Used in AP and lateral projections to ensure appropriate position of the capitate and the lunate facet which was the case.  The tourniquet was deflated at 57 minutes.  Fingertips were pink with brisk capillary refill after deflation of tourniquet.  The operative  drapes were broken down.  The patient was awoken from anesthesia safely.  He was transferred back to the stretcher and taken to PACU in stable condition.  I will see him back in the office in 1 week for postoperative followup.  I will give him a prescription for Dilaudid 2 mg 1 p.o. every 6 hours as needed pain dispense #20.  He states he has required this for previous surgeries for good relief of pain.   Leanora Cover, MD Electronically signed, 03/22/22

## 2022-03-22 NOTE — Anesthesia Postprocedure Evaluation (Signed)
Anesthesia Post Note  Patient: Fred Adjei Hazell  Procedure(s) Performed: RIGHT WRIST PROXIMAL ROW CARPECTOMY, POSSIBLE RADIAL STYLOIDECTOMY (Right: Wrist) POSTERIOR INTEROSSEOUS NEURECTOMY (Right: Wrist)     Patient location during evaluation: PACU Anesthesia Type: Regional and MAC Level of consciousness: awake and alert Pain management: pain level controlled Vital Signs Assessment: post-procedure vital signs reviewed and stable Respiratory status: spontaneous breathing and respiratory function stable Cardiovascular status: stable Postop Assessment: no apparent nausea or vomiting Anesthetic complications: no   No notable events documented.  Last Vitals:  Vitals:   03/22/22 1100 03/22/22 1115  BP: 118/80 (!) 124/91  Pulse: 62 63  Resp: 14 15  Temp:  36.7 C  SpO2: 95% 94%    Last Pain:  Vitals:   03/22/22 1115  PainSc: 0-No pain                 Cadie Sorci DANIEL

## 2022-03-22 NOTE — Discharge Instructions (Signed)

## 2022-03-22 NOTE — Anesthesia Procedure Notes (Signed)
Anesthesia Regional Block: Interscalene brachial plexus block   Pre-Anesthetic Checklist: , timeout performed,  Correct Patient, Correct Site, Correct Laterality,  Correct Procedure, Correct Position, site marked,  Risks and benefits discussed,  Surgical consent,  Pre-op evaluation,  At surgeon's request and post-op pain management  Laterality: Right  Prep: chloraprep       Needles:  Injection technique: Single-shot  Needle Type: Echogenic Stimulator Needle     Needle Length: 5cm  Needle Gauge: 22     Additional Needles:   Narrative:  Start time: 03/22/2022 8:21 AM End time: 03/22/2022 8:31 AM Injection made incrementally with aspirations every 5 mL.  Performed by: Personally  Anesthesiologist: Duane Boston, MD  Additional Notes: Functioning IV was confirmed and monitors applied.  A 3m 22ga echogenic arrow stimulator was used. Sterile prep and drape,hand hygiene and sterile gloves were used.Ultrasound guidance: relevant anatomy identified, needle position confirmed, local anesthetic spread visualized around nerve(s)., vascular puncture avoided.  Image printed for medical record.  Negative aspiration and negative test dose prior to incremental administration of local anesthetic. The patient tolerated the procedure well.
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
## 2022-03-23 ENCOUNTER — Encounter (HOSPITAL_COMMUNITY): Payer: Self-pay | Admitting: Orthopedic Surgery

## 2023-04-02 DIAGNOSIS — Z79899 Other long term (current) drug therapy: Secondary | ICD-10-CM | POA: Diagnosis not present

## 2023-04-09 DIAGNOSIS — R918 Other nonspecific abnormal finding of lung field: Secondary | ICD-10-CM | POA: Diagnosis not present

## 2023-04-09 DIAGNOSIS — J449 Chronic obstructive pulmonary disease, unspecified: Secondary | ICD-10-CM | POA: Diagnosis not present

## 2023-04-09 DIAGNOSIS — J8410 Pulmonary fibrosis, unspecified: Secondary | ICD-10-CM | POA: Diagnosis not present

## 2023-04-10 DIAGNOSIS — J449 Chronic obstructive pulmonary disease, unspecified: Secondary | ICD-10-CM | POA: Diagnosis not present

## 2023-04-10 DIAGNOSIS — J84178 Other interstitial pulmonary diseases with fibrosis in diseases classified elsewhere: Secondary | ICD-10-CM | POA: Diagnosis not present

## 2023-04-10 DIAGNOSIS — J9611 Chronic respiratory failure with hypoxia: Secondary | ICD-10-CM | POA: Diagnosis not present

## 2023-04-29 DIAGNOSIS — G8929 Other chronic pain: Secondary | ICD-10-CM | POA: Diagnosis not present

## 2023-04-29 DIAGNOSIS — M545 Low back pain, unspecified: Secondary | ICD-10-CM | POA: Diagnosis not present

## 2023-04-29 DIAGNOSIS — Z79899 Other long term (current) drug therapy: Secondary | ICD-10-CM | POA: Diagnosis not present

## 2023-04-29 DIAGNOSIS — E1165 Type 2 diabetes mellitus with hyperglycemia: Secondary | ICD-10-CM | POA: Diagnosis not present

## 2023-04-29 DIAGNOSIS — F17210 Nicotine dependence, cigarettes, uncomplicated: Secondary | ICD-10-CM | POA: Diagnosis not present

## 2023-05-01 DIAGNOSIS — F17210 Nicotine dependence, cigarettes, uncomplicated: Secondary | ICD-10-CM | POA: Diagnosis not present

## 2023-05-01 DIAGNOSIS — J8410 Pulmonary fibrosis, unspecified: Secondary | ICD-10-CM | POA: Diagnosis not present

## 2023-05-01 DIAGNOSIS — J449 Chronic obstructive pulmonary disease, unspecified: Secondary | ICD-10-CM | POA: Diagnosis not present

## 2023-05-11 DIAGNOSIS — J84178 Other interstitial pulmonary diseases with fibrosis in diseases classified elsewhere: Secondary | ICD-10-CM | POA: Diagnosis not present

## 2023-05-11 DIAGNOSIS — J9611 Chronic respiratory failure with hypoxia: Secondary | ICD-10-CM | POA: Diagnosis not present

## 2023-05-11 DIAGNOSIS — J449 Chronic obstructive pulmonary disease, unspecified: Secondary | ICD-10-CM | POA: Diagnosis not present

## 2023-05-28 DIAGNOSIS — E1165 Type 2 diabetes mellitus with hyperglycemia: Secondary | ICD-10-CM | POA: Diagnosis not present

## 2023-05-28 DIAGNOSIS — M545 Low back pain, unspecified: Secondary | ICD-10-CM | POA: Diagnosis not present

## 2023-05-28 DIAGNOSIS — Z79899 Other long term (current) drug therapy: Secondary | ICD-10-CM | POA: Diagnosis not present

## 2023-05-28 DIAGNOSIS — F17210 Nicotine dependence, cigarettes, uncomplicated: Secondary | ICD-10-CM | POA: Diagnosis not present

## 2023-05-28 DIAGNOSIS — G8929 Other chronic pain: Secondary | ICD-10-CM | POA: Diagnosis not present

## 2023-05-30 DIAGNOSIS — Z79899 Other long term (current) drug therapy: Secondary | ICD-10-CM | POA: Diagnosis not present

## 2023-06-10 DIAGNOSIS — J9611 Chronic respiratory failure with hypoxia: Secondary | ICD-10-CM | POA: Diagnosis not present

## 2023-06-10 DIAGNOSIS — J84178 Other interstitial pulmonary diseases with fibrosis in diseases classified elsewhere: Secondary | ICD-10-CM | POA: Diagnosis not present

## 2023-06-10 DIAGNOSIS — J449 Chronic obstructive pulmonary disease, unspecified: Secondary | ICD-10-CM | POA: Diagnosis not present

## 2023-06-11 DIAGNOSIS — R7989 Other specified abnormal findings of blood chemistry: Secondary | ICD-10-CM | POA: Diagnosis not present

## 2023-06-11 DIAGNOSIS — E559 Vitamin D deficiency, unspecified: Secondary | ICD-10-CM | POA: Diagnosis not present

## 2023-06-11 DIAGNOSIS — R5383 Other fatigue: Secondary | ICD-10-CM | POA: Diagnosis not present

## 2023-06-11 DIAGNOSIS — E7800 Pure hypercholesterolemia, unspecified: Secondary | ICD-10-CM | POA: Diagnosis not present

## 2023-06-11 DIAGNOSIS — E119 Type 2 diabetes mellitus without complications: Secondary | ICD-10-CM | POA: Diagnosis not present

## 2023-06-11 DIAGNOSIS — I10 Essential (primary) hypertension: Secondary | ICD-10-CM | POA: Diagnosis not present

## 2023-06-25 DIAGNOSIS — E1165 Type 2 diabetes mellitus with hyperglycemia: Secondary | ICD-10-CM | POA: Diagnosis not present

## 2023-06-25 DIAGNOSIS — Z79899 Other long term (current) drug therapy: Secondary | ICD-10-CM | POA: Diagnosis not present

## 2023-06-25 DIAGNOSIS — F17210 Nicotine dependence, cigarettes, uncomplicated: Secondary | ICD-10-CM | POA: Diagnosis not present

## 2023-06-25 DIAGNOSIS — M545 Low back pain, unspecified: Secondary | ICD-10-CM | POA: Diagnosis not present

## 2023-06-25 DIAGNOSIS — G8929 Other chronic pain: Secondary | ICD-10-CM | POA: Diagnosis not present

## 2023-06-26 DIAGNOSIS — Z79899 Other long term (current) drug therapy: Secondary | ICD-10-CM | POA: Diagnosis not present

## 2023-07-11 DIAGNOSIS — J449 Chronic obstructive pulmonary disease, unspecified: Secondary | ICD-10-CM | POA: Diagnosis not present

## 2023-07-11 DIAGNOSIS — J84178 Other interstitial pulmonary diseases with fibrosis in diseases classified elsewhere: Secondary | ICD-10-CM | POA: Diagnosis not present

## 2023-07-11 DIAGNOSIS — J9611 Chronic respiratory failure with hypoxia: Secondary | ICD-10-CM | POA: Diagnosis not present

## 2023-07-24 DIAGNOSIS — F17210 Nicotine dependence, cigarettes, uncomplicated: Secondary | ICD-10-CM | POA: Diagnosis not present

## 2023-07-24 DIAGNOSIS — M545 Low back pain, unspecified: Secondary | ICD-10-CM | POA: Diagnosis not present

## 2023-07-24 DIAGNOSIS — Z79899 Other long term (current) drug therapy: Secondary | ICD-10-CM | POA: Diagnosis not present

## 2023-07-24 DIAGNOSIS — E1165 Type 2 diabetes mellitus with hyperglycemia: Secondary | ICD-10-CM | POA: Diagnosis not present

## 2023-07-24 DIAGNOSIS — G8929 Other chronic pain: Secondary | ICD-10-CM | POA: Diagnosis not present

## 2023-07-29 DIAGNOSIS — Z79899 Other long term (current) drug therapy: Secondary | ICD-10-CM | POA: Diagnosis not present

## 2023-08-10 DIAGNOSIS — J84178 Other interstitial pulmonary diseases with fibrosis in diseases classified elsewhere: Secondary | ICD-10-CM | POA: Diagnosis not present

## 2023-08-10 DIAGNOSIS — J449 Chronic obstructive pulmonary disease, unspecified: Secondary | ICD-10-CM | POA: Diagnosis not present

## 2023-08-10 DIAGNOSIS — J9611 Chronic respiratory failure with hypoxia: Secondary | ICD-10-CM | POA: Diagnosis not present

## 2023-08-23 DIAGNOSIS — Z79899 Other long term (current) drug therapy: Secondary | ICD-10-CM | POA: Diagnosis not present

## 2023-08-23 DIAGNOSIS — F17210 Nicotine dependence, cigarettes, uncomplicated: Secondary | ICD-10-CM | POA: Diagnosis not present

## 2023-08-23 DIAGNOSIS — M545 Low back pain, unspecified: Secondary | ICD-10-CM | POA: Diagnosis not present

## 2023-08-23 DIAGNOSIS — E1165 Type 2 diabetes mellitus with hyperglycemia: Secondary | ICD-10-CM | POA: Diagnosis not present

## 2023-08-23 DIAGNOSIS — G8929 Other chronic pain: Secondary | ICD-10-CM | POA: Diagnosis not present

## 2023-08-27 DIAGNOSIS — E1165 Type 2 diabetes mellitus with hyperglycemia: Secondary | ICD-10-CM | POA: Diagnosis not present

## 2023-08-27 DIAGNOSIS — K581 Irritable bowel syndrome with constipation: Secondary | ICD-10-CM | POA: Diagnosis not present

## 2023-08-27 DIAGNOSIS — M545 Low back pain, unspecified: Secondary | ICD-10-CM | POA: Diagnosis not present

## 2023-08-27 DIAGNOSIS — F17210 Nicotine dependence, cigarettes, uncomplicated: Secondary | ICD-10-CM | POA: Diagnosis not present

## 2023-08-27 DIAGNOSIS — Z79899 Other long term (current) drug therapy: Secondary | ICD-10-CM | POA: Diagnosis not present

## 2023-08-29 DIAGNOSIS — Z79899 Other long term (current) drug therapy: Secondary | ICD-10-CM | POA: Diagnosis not present
